# Patient Record
Sex: Female | Born: 1950
Health system: Southern US, Community
[De-identification: ages and names within clinical notes are randomized; demographics above are authoritative.]

## PROBLEM LIST (undated history)

## (undated) DIAGNOSIS — T7840XA Allergy, unspecified, initial encounter: Secondary | ICD-10-CM

## (undated) DIAGNOSIS — D649 Anemia, unspecified: Secondary | ICD-10-CM

## (undated) DIAGNOSIS — K219 Gastro-esophageal reflux disease without esophagitis: Secondary | ICD-10-CM

## (undated) DIAGNOSIS — N289 Disorder of kidney and ureter, unspecified: Secondary | ICD-10-CM

## (undated) DIAGNOSIS — E8881 Metabolic syndrome: Secondary | ICD-10-CM

## (undated) DIAGNOSIS — M1990 Unspecified osteoarthritis, unspecified site: Secondary | ICD-10-CM

## (undated) DIAGNOSIS — I10 Essential (primary) hypertension: Secondary | ICD-10-CM

## (undated) DIAGNOSIS — E669 Obesity, unspecified: Secondary | ICD-10-CM

## (undated) DIAGNOSIS — E785 Hyperlipidemia, unspecified: Secondary | ICD-10-CM

## (undated) DIAGNOSIS — R42 Dizziness and giddiness: Secondary | ICD-10-CM

## (undated) DIAGNOSIS — R569 Unspecified convulsions: Secondary | ICD-10-CM

## (undated) DIAGNOSIS — R7303 Prediabetes: Secondary | ICD-10-CM

## (undated) HISTORY — DX: Gastro-esophageal reflux disease without esophagitis: K21.9

## (undated) HISTORY — DX: Morbid (severe) obesity due to excess calories: E66.01

## (undated) HISTORY — PX: OTHER SURGICAL HISTORY: SHX169

## (undated) HISTORY — DX: Hyperlipidemia, unspecified: E78.5

## (undated) HISTORY — DX: Obesity, unspecified: E66.9

## (undated) HISTORY — DX: Prediabetes: R73.03

## (undated) HISTORY — DX: Metabolic syndrome: E88.81

## (undated) HISTORY — DX: Allergy, unspecified, initial encounter: T78.40XA

## (undated) HISTORY — DX: Essential (primary) hypertension: I10

---

## 1973-08-15 HISTORY — PX: TUBAL LIGATION: SHX77

## 1985-08-15 HISTORY — PX: PARTIAL HYSTERECTOMY: SHX80

## 1998-08-15 DIAGNOSIS — E8881 Metabolic syndrome: Secondary | ICD-10-CM

## 1998-08-15 HISTORY — DX: Metabolic syndrome: E88.810

## 1998-08-15 HISTORY — DX: Metabolic syndrome: E88.81

## 1998-08-15 HISTORY — DX: Morbid (severe) obesity due to excess calories: E66.01

## 2004-07-14 ENCOUNTER — Ambulatory Visit: Payer: Self-pay | Admitting: Family Medicine

## 2005-06-29 ENCOUNTER — Ambulatory Visit: Payer: Self-pay | Admitting: Family Medicine

## 2005-08-22 ENCOUNTER — Ambulatory Visit: Payer: Self-pay | Admitting: Family Medicine

## 2005-09-01 ENCOUNTER — Ambulatory Visit: Payer: Self-pay | Admitting: Family Medicine

## 2005-10-04 ENCOUNTER — Ambulatory Visit: Payer: Self-pay | Admitting: Family Medicine

## 2005-10-27 ENCOUNTER — Ambulatory Visit: Payer: Self-pay | Admitting: Family Medicine

## 2005-12-15 ENCOUNTER — Ambulatory Visit: Payer: Self-pay | Admitting: Family Medicine

## 2005-12-22 ENCOUNTER — Ambulatory Visit (HOSPITAL_COMMUNITY): Admission: RE | Admit: 2005-12-22 | Discharge: 2005-12-22 | Payer: Self-pay | Admitting: Family Medicine

## 2005-12-22 LAB — HM MAMMOGRAPHY: HM Mammogram: NORMAL

## 2006-01-26 ENCOUNTER — Ambulatory Visit: Payer: Self-pay | Admitting: Family Medicine

## 2006-03-28 ENCOUNTER — Ambulatory Visit: Payer: Self-pay | Admitting: Family Medicine

## 2006-04-11 ENCOUNTER — Ambulatory Visit: Payer: Self-pay | Admitting: Family Medicine

## 2006-04-14 ENCOUNTER — Ambulatory Visit: Payer: Self-pay | Admitting: Family Medicine

## 2006-05-09 ENCOUNTER — Ambulatory Visit: Payer: Self-pay | Admitting: Family Medicine

## 2006-05-25 ENCOUNTER — Ambulatory Visit: Payer: Self-pay | Admitting: Family Medicine

## 2006-07-03 ENCOUNTER — Ambulatory Visit: Payer: Self-pay | Admitting: Family Medicine

## 2006-09-01 ENCOUNTER — Encounter: Payer: Self-pay | Admitting: Family Medicine

## 2006-09-01 LAB — CONVERTED CEMR LAB
ALT: 16 units/L (ref 0–35)
Albumin: 4.7 g/dL (ref 3.5–5.2)
Bilirubin, Direct: 0.1 mg/dL (ref 0.0–0.3)
Calcium: 9.4 mg/dL (ref 8.4–10.5)
Creatinine, Ser: 0.86 mg/dL (ref 0.40–1.20)
Glucose, Bld: 91 mg/dL (ref 70–99)
Indirect Bilirubin: 0.4 mg/dL (ref 0.0–0.9)
Potassium: 4.1 meq/L (ref 3.5–5.3)
Sodium: 142 meq/L (ref 135–145)
Total Bilirubin: 0.5 mg/dL (ref 0.3–1.2)
VLDL: 34 mg/dL (ref 0–40)

## 2006-09-04 ENCOUNTER — Ambulatory Visit: Payer: Self-pay | Admitting: Family Medicine

## 2006-10-25 ENCOUNTER — Ambulatory Visit: Payer: Self-pay | Admitting: Family Medicine

## 2006-11-01 ENCOUNTER — Encounter: Payer: Self-pay | Admitting: Family Medicine

## 2006-11-01 LAB — CONVERTED CEMR LAB: Pap Smear: NORMAL

## 2007-01-12 ENCOUNTER — Encounter: Payer: Self-pay | Admitting: Family Medicine

## 2007-01-12 LAB — CONVERTED CEMR LAB
ALT: 14 units/L (ref 0–35)
Albumin: 4.6 g/dL (ref 3.5–5.2)
Alkaline Phosphatase: 59 units/L (ref 39–117)
Basophils Absolute: 0 10*3/uL (ref 0.0–0.1)
Bilirubin, Direct: 0.1 mg/dL (ref 0.0–0.3)
Eosinophils Absolute: 0.1 10*3/uL (ref 0.0–0.7)
HCT: 33.5 % — ABNORMAL LOW (ref 36.0–46.0)
HDL: 41 mg/dL (ref >39)
Indirect Bilirubin: 0.5 mg/dL (ref 0.0–0.9)
LDL Cholesterol: 82 mg/dL (ref 0–99)
MCHC: 33.4 g/dL (ref 30.0–36.0)
Monocytes Absolute: 0.3 10*3/uL (ref 0.2–0.7)
Monocytes Relative: 6 % (ref 3–11)
Neutrophils Relative %: 45 % (ref 43–77)
RDW: 15.5 % — ABNORMAL HIGH (ref 11.5–14.0)
Total Bilirubin: 0.6 mg/dL (ref 0.3–1.2)
Total CHOL/HDL Ratio: 4
WBC: 5.6 10*3/uL (ref 4.0–10.5)

## 2007-01-16 ENCOUNTER — Encounter: Payer: Self-pay | Admitting: Family Medicine

## 2007-01-16 ENCOUNTER — Ambulatory Visit: Payer: Self-pay | Admitting: Family Medicine

## 2007-01-16 ENCOUNTER — Other Ambulatory Visit: Admission: RE | Admit: 2007-01-16 | Discharge: 2007-01-16 | Payer: Self-pay | Admitting: Family Medicine

## 2007-01-16 LAB — CONVERTED CEMR LAB: Pap Smear: NORMAL

## 2007-04-10 ENCOUNTER — Ambulatory Visit: Payer: Self-pay | Admitting: Family Medicine

## 2007-05-08 ENCOUNTER — Ambulatory Visit: Payer: Self-pay | Admitting: Family Medicine

## 2007-10-08 ENCOUNTER — Ambulatory Visit: Payer: Self-pay | Admitting: Family Medicine

## 2007-12-06 ENCOUNTER — Encounter: Payer: Self-pay | Admitting: Family Medicine

## 2007-12-06 ENCOUNTER — Ambulatory Visit: Payer: Self-pay | Admitting: Family Medicine

## 2007-12-06 DIAGNOSIS — J302 Other seasonal allergic rhinitis: Secondary | ICD-10-CM

## 2007-12-06 DIAGNOSIS — I10 Essential (primary) hypertension: Secondary | ICD-10-CM

## 2007-12-07 DIAGNOSIS — E876 Hypokalemia: Secondary | ICD-10-CM | POA: Insufficient documentation

## 2007-12-07 DIAGNOSIS — R42 Dizziness and giddiness: Secondary | ICD-10-CM

## 2008-02-01 ENCOUNTER — Encounter: Payer: Self-pay | Admitting: Family Medicine

## 2008-02-01 LAB — CONVERTED CEMR LAB: Potassium: 4 meq/L (ref 3.5–5.3)

## 2008-02-05 ENCOUNTER — Ambulatory Visit: Payer: Self-pay | Admitting: Family Medicine

## 2008-02-05 ENCOUNTER — Encounter: Payer: Self-pay | Admitting: Family Medicine

## 2008-02-05 LAB — CONVERTED CEMR LAB
Calcium: 9.7 mg/dL (ref 8.4–10.5)
Chloride: 104 meq/L (ref 96–112)
Cholesterol: 180 mg/dL (ref 0–200)
Potassium: 4.1 meq/L (ref 3.5–5.3)
Sodium: 142 meq/L (ref 135–145)
Triglycerides: 303 mg/dL — ABNORMAL HIGH (ref <150)

## 2008-02-08 ENCOUNTER — Ambulatory Visit (HOSPITAL_COMMUNITY): Admission: RE | Admit: 2008-02-08 | Discharge: 2008-02-08 | Payer: Self-pay | Admitting: Family Medicine

## 2008-03-28 ENCOUNTER — Telehealth: Payer: Self-pay | Admitting: Family Medicine

## 2008-06-06 ENCOUNTER — Encounter: Payer: Self-pay | Admitting: Family Medicine

## 2008-06-09 ENCOUNTER — Ambulatory Visit: Payer: Self-pay | Admitting: Family Medicine

## 2008-06-09 DIAGNOSIS — E785 Hyperlipidemia, unspecified: Secondary | ICD-10-CM

## 2008-06-09 LAB — CONVERTED CEMR LAB
ALT: 20 units/L (ref 0–35)
AST: 20 units/L (ref 0–37)
BUN: 14 mg/dL (ref 6–23)
Bilirubin, Direct: <0.1 mg/dL (ref 0.0–0.3)
CO2: 23 meq/L (ref 19–32)
Calcium: 9.4 mg/dL (ref 8.4–10.5)
Chloride: 104 meq/L (ref 96–112)
Creatinine, Ser: 0.99 mg/dL (ref 0.40–1.20)
Glucose, Bld: 106 mg/dL — ABNORMAL HIGH (ref 70–99)
HDL: 47 mg/dL (ref >39)
Total Bilirubin: 0.5 mg/dL (ref 0.3–1.2)
Total Protein: 7.5 g/dL (ref 6.0–8.3)

## 2008-08-15 DIAGNOSIS — R7303 Prediabetes: Secondary | ICD-10-CM

## 2008-08-15 HISTORY — DX: Prediabetes: R73.03

## 2008-10-09 ENCOUNTER — Encounter: Payer: Self-pay | Admitting: Family Medicine

## 2008-10-10 ENCOUNTER — Encounter: Payer: Self-pay | Admitting: Family Medicine

## 2008-10-10 LAB — CONVERTED CEMR LAB
ALT: 18 units/L (ref 0–35)
AST: 19 units/L (ref 0–37)
CO2: 23 meq/L (ref 19–32)
Calcium: 9.8 mg/dL (ref 8.4–10.5)
Cholesterol: 200 mg/dL (ref 0–200)
Creatinine, Ser: 1 mg/dL (ref 0.40–1.20)
Indirect Bilirubin: 0.4 mg/dL (ref 0.0–0.9)
Potassium: 4 meq/L (ref 3.5–5.3)
Total Protein: 7.7 g/dL (ref 6.0–8.3)
Triglycerides: 345 mg/dL — ABNORMAL HIGH (ref <150)

## 2008-10-13 ENCOUNTER — Ambulatory Visit: Payer: Self-pay | Admitting: Family Medicine

## 2008-10-13 DIAGNOSIS — G4700 Insomnia, unspecified: Secondary | ICD-10-CM

## 2008-11-20 ENCOUNTER — Encounter: Payer: Self-pay | Admitting: Family Medicine

## 2009-02-12 ENCOUNTER — Ambulatory Visit: Payer: Self-pay | Admitting: Family Medicine

## 2009-02-13 ENCOUNTER — Encounter: Payer: Self-pay | Admitting: Family Medicine

## 2009-02-13 LAB — CONVERTED CEMR LAB
Basophils Relative: 0 % (ref 0–1)
Eosinophils Relative: 1 % (ref 0–5)
HCT: 36.7 % (ref 36.0–46.0)
Hemoglobin: 12.2 g/dL (ref 12.0–15.0)
Lymphocytes Relative: 53 % — ABNORMAL HIGH (ref 12–46)
Monocytes Relative: 6 % (ref 3–12)
Neutrophils Relative %: 40 % — ABNORMAL LOW (ref 43–77)
Platelets: 264 10*3/uL (ref 150–400)
RBC: 3.99 M/uL (ref 3.87–5.11)
WBC: 5.8 10*3/uL (ref 4.0–10.5)

## 2009-05-01 ENCOUNTER — Emergency Department (HOSPITAL_COMMUNITY): Admission: EM | Admit: 2009-05-01 | Discharge: 2009-05-01 | Payer: Self-pay | Admitting: Emergency Medicine

## 2009-06-24 ENCOUNTER — Ambulatory Visit: Payer: Self-pay | Admitting: Family Medicine

## 2009-07-01 DIAGNOSIS — J019 Acute sinusitis, unspecified: Secondary | ICD-10-CM | POA: Insufficient documentation

## 2009-10-28 ENCOUNTER — Ambulatory Visit: Payer: Self-pay | Admitting: Family Medicine

## 2009-11-09 LAB — CONVERTED CEMR LAB
Albumin: 4.6 g/dL (ref 3.5–5.2)
Basophils Absolute: 0 10*3/uL (ref 0.0–0.1)
Basophils Relative: 0 % (ref 0–1)
CO2: 27 meq/L (ref 19–32)
Calcium: 9.8 mg/dL (ref 8.4–10.5)
Eosinophils Absolute: 0.1 10*3/uL (ref 0.0–0.7)
Eosinophils Relative: 1 % (ref 0–5)
Glucose, Bld: 84 mg/dL (ref 70–99)
HCT: 35.5 % — ABNORMAL LOW (ref 36.0–46.0)
HDL: 48 mg/dL (ref >39)
MCHC: 33.2 g/dL (ref 30.0–36.0)
Monocytes Absolute: 0.7 10*3/uL (ref 0.1–1.0)
Monocytes Relative: 7 % (ref 3–12)
Neutrophils Relative %: 36 % — ABNORMAL LOW (ref 43–77)
Platelets: 277 10*3/uL (ref 150–400)
Potassium: 3.6 meq/L (ref 3.5–5.3)
RBC: 3.85 M/uL — ABNORMAL LOW (ref 3.87–5.11)
RDW: 15.3 % (ref 11.5–15.5)
Total CHOL/HDL Ratio: 4
Triglycerides: 201 mg/dL — ABNORMAL HIGH (ref <150)

## 2010-02-11 ENCOUNTER — Telehealth: Payer: Self-pay | Admitting: Family Medicine

## 2010-03-01 ENCOUNTER — Telehealth: Payer: Self-pay | Admitting: Family Medicine

## 2010-04-13 ENCOUNTER — Encounter: Payer: Self-pay | Admitting: Physician Assistant

## 2010-04-22 LAB — CONVERTED CEMR LAB
AST: 15 units/L (ref 0–37)
Albumin: 4.4 g/dL (ref 3.5–5.2)
Alkaline Phosphatase: 53 units/L (ref 39–117)
BUN: 19 mg/dL (ref 6–23)
Chloride: 106 meq/L (ref 96–112)
Cholesterol: 190 mg/dL (ref 0–200)
Creatinine, Ser: 1.01 mg/dL (ref 0.40–1.20)
HCT: 36.1 % (ref 36.0–46.0)
HDL: 39 mg/dL — ABNORMAL LOW (ref >39)
LDL Cholesterol: 102 mg/dL — ABNORMAL HIGH (ref 0–99)
MCHC: 33.2 g/dL (ref 30.0–36.0)
MCV: 88.7 fL (ref 78.0–100.0)
Platelets: 266 10*3/uL (ref 150–400)
Potassium: 4.6 meq/L (ref 3.5–5.3)
RDW: 14.8 % (ref 11.5–15.5)
Total Protein: 7.1 g/dL (ref 6.0–8.3)
VLDL: 49 mg/dL — ABNORMAL HIGH (ref 0–40)

## 2010-05-31 ENCOUNTER — Ambulatory Visit: Payer: Self-pay | Admitting: Family Medicine

## 2010-05-31 DIAGNOSIS — M25569 Pain in unspecified knee: Secondary | ICD-10-CM

## 2010-09-05 ENCOUNTER — Encounter: Payer: Self-pay | Admitting: Family Medicine

## 2010-09-06 ENCOUNTER — Encounter: Payer: Self-pay | Admitting: Family Medicine

## 2010-09-14 NOTE — Assessment & Plan Note (Signed)
Summary: office visit   Vital Signs:  Patient profile:   60 year old female Menstrual status:  hysterectomy Height:      66.5 inches Weight:      227 pounds BMI:     36.22 O2 Sat:      96 % Pulse rate:   74 / minute Pulse rhythm:   regular Resp:     16 per minute BP sitting:   122 / 82  (left arm) Cuff size:   large  Vitals Entered By: Everitt Amber LPN (October 28, 2009 8:58 AM)  Nutrition Counseling: Patient's BMI is greater than 25 and therefore counseled on weight management options. CC: having some sinus pain and pressure and some drainage from allergies.    CC:  having some sinus pain and pressure and some drainage from allergies. .  History of Present Illness: Reports  that she has been doing well. Denies recent fever or chills. Reports sinus pressure, nasal congestion ,with cleaer drainage , sometimes yellow, no ear pain or sore throat. Denies chest congestion, or cough productive of sputum.she does have a dry cough aty times Denies chest pain, palpitations, PND, orthopnea or leg swelling. Denies abdominal pain, nausea, vomitting, diarrhea or constipation. Denies change in bowel movements or bloody stool. Denies dysuria , frequency, incontinence or hesitancy. Denies  joint pain, swelling, or reduced mobility. Denies headaches, vertigo, seizures. Denies depression, anxiety or insomnia. Denies  rash, lesions, or itch.     Current Medications (verified): 1)  Lovastatin 20 Mg  Tabs (Lovastatin) .... Take 1 Tablet By Mouth Once A Day 2)  Restoril 30 Mg Caps (Temazepam) .... Take 1 Tab By Mouth At Bedtime 3)  Maxzide 75-50 Mg Tabs (Triamterene-Hctz) .... Take 1 Tablet By Mouth Once A Day 4)  Zyrtec Allergy 10 Mg Tabs (Cetirizine Hcl) .... Take 1 Tablet By Mouth Once A Day 5)  Nasonex 50 Mcg/act Susp (Mometasone Furoate) .... 2 Sprays in Each Nostril Daily 6)  Antivert 25 Mg Tabs (Meclizine Hcl) .... One Tab Q 6 Hrs As Needed  Allergies (verified): 1)  ! Pcn 2)  !  Ibuprofen (Ibuprofen)  Review of Systems      See HPI General:  Complains of chills, fever, and malaise; 2 week history. Eyes:  Denies blurring, discharge, eye pain, and red eye. ENT:  Complains of hoarseness, nasal congestion, postnasal drainage, and sinus pressure; thick post nasal drainge, yellow, sometimes associated with fever and chills in the past 2 weeks, ever since the pear trees start blooming. CV:  Denies chest pain or discomfort, difficulty breathing while lying down, palpitations, shortness of breath with exertion, and swelling of feet. Resp:  Complains of cough and shortness of breath; denies sputum productive and wheezing. GI:  Denies abdominal pain, constipation, and diarrhea. Derm:  Denies itching and rash. Heme:  Denies abnormal bruising and bleeding. Allergy:  Complains of persistent infections, seasonal allergies, and sneezing; denies hives or rash.  Physical Exam  General:  Well-developed,well-nourished,in no acute distress; alert,appropriate and cooperative throughout examination HEENT: No facial asymmetry,  EOMI, No sinus tenderness, TM's Clear, oropharynx  pink and moist.   Chest: Clear to auscultation bilaterally.  CVS: S1, S2, No murmurs, No S3.   Abd: Soft, Nontender.  MS: Adequate ROM spine, hips, shoulders and knees.  Ext: No edema.   CNS: CN 2-12 intact, power tone and sensation normal throughout.   Skin: Intact, no visible lesions or rashes.  Psych: Good eye contact, normal affect.  Memory intact, not anxious  or depressed appearing.    Impression & Recommendations:  Problem # 1:  INSOMNIA UNSPECIFIED (ICD-780.52) Assessment Improved  Her updated medication list for this problem includes:    Restoril 30 Mg Caps (Temazepam) .Marland Kitchen... Take 1 tab by mouth at bedtime  Discussed sleep hygiene.   Problem # 2:  ACUTE SINUSITIS, UNSPECIFIED (ICD-461.9) Assessment: Comment Only  The following medications were removed from the medication list:    Septra Ds  800-160 Mg Tabs (Sulfamethoxazole-trimethoprim) .Marland Kitchen... Take 1 tablet by mouth two times a day    Tessalon Perles 100 Mg Caps (Benzonatate) .Marland Kitchen... Take 1 capsule by mouth three times a day Her updated medication list for this problem includes:    Nasonex 50 Mcg/act Susp (Mometasone furoate) .Marland Kitchen... 2 sprays in each nostril daily    Septra Ds 800-160 Mg Tabs (Sulfamethoxazole-trimethoprim) .Marland Kitchen... Take 1 tablet by mouth two times a day    Tessalon Perles 100 Mg Caps (Benzonatate) .Marland Kitchen... Take 1 capsule by mouth three times a day  Problem # 3:  HYPERLIPIDEMIA (ICD-272.4) Assessment: Comment Only  Her updated medication list for this problem includes:    Lovastatin 20 Mg Tabs (Lovastatin) .Marland Kitchen... Take 1 tablet by mouth once a day  Labs Reviewed: SGOT: 19 (10/10/2008)   SGPT: 18 (10/10/2008)   HDL:40 (02/12/2009), 40 (10/10/2008)  LDL:See Comment mg/dL (98/06/9146), 91 (82/95/6213)  Chol:227 (02/12/2009), 200 (10/10/2008)  Trig:467 (02/12/2009), 345 (10/10/2008)  Problem # 4:  OBESITY (ICD-278.00) Assessment: Unchanged  Ht: 66.5 (10/28/2009)   Wt: 227 (10/28/2009)   BMI: 36.22 (10/28/2009)  Problem # 5:  HYPERTENSION (ICD-401.9) Assessment: Improved  Her updated medication list for this problem includes:    Maxzide 75-50 Mg Tabs (Triamterene-hctz) .Marland Kitchen... Take 1 tablet by mouth once a day  BP today: 122/82 Prior BP: 138/94 (06/24/2009)  Labs Reviewed: K+: 4.0 (10/10/2008) Creat: : 1.00 (10/10/2008)   Chol: 227 (02/12/2009)   HDL: 40 (02/12/2009)   LDL: See Comment mg/dL (08/65/7846)   TG: 962 (02/12/2009)  Problem # 6:  ALLERGIC RHINITIS (ICD-477.9) Assessment: Deteriorated  Her updated medication list for this problem includes:    Zyrtec Allergy 10 Mg Tabs (Cetirizine hcl) .Marland Kitchen... Take 1 tablet by mouth once a day    Nasonex 50 Mcg/act Susp (Mometasone furoate) .Marland Kitchen... 2 sprays in each nostril daily  Complete Medication List: 1)  Lovastatin 20 Mg Tabs (Lovastatin) .... Take 1 tablet by mouth  once a day 2)  Restoril 30 Mg Caps (Temazepam) .... Take 1 tab by mouth at bedtime 3)  Maxzide 75-50 Mg Tabs (Triamterene-hctz) .... Take 1 tablet by mouth once a day 4)  Zyrtec Allergy 10 Mg Tabs (Cetirizine hcl) .... Take 1 tablet by mouth once a day 5)  Nasonex 50 Mcg/act Susp (Mometasone furoate) .... 2 sprays in each nostril daily 6)  Antivert 25 Mg Tabs (Meclizine hcl) .... One tab q 6 hrs as needed 7)  Septra Ds 800-160 Mg Tabs (Sulfamethoxazole-trimethoprim) .... Take 1 tablet by mouth two times a day 8)  Tessalon Perles 100 Mg Caps (Benzonatate) .... Take 1 capsule by mouth three times a day  Patient Instructions: 1)  F?u in 5 5 months. 2)  You are being treated for uncontrolled allergies and sinus infection. 3)  Meds are sent top your pharmacy. 4)  It is important that you exercise regularly at least 20 minutes 5 times a week. If you develop chest pain, have severe difficulty breathing, or feel very tired , stop exercising immediately and seek medical attention. 5)  You need to lose weight. Consider a lower calorie diet and regular exercise.  6)  Have a safe and happy Spring and Summer. Prescriptions: NASONEX 50 MCG/ACT SUSP (MOMETASONE FUROATE) 2 sprays in each nostril daily  #1 x 4   Entered by:   Everitt Amber LPN   Authorized by:   Syliva Overman MD   Signed by:   Everitt Amber LPN on 18/56/3149   Method used:   Printed then faxed to ...       [facility] (retail)       [address]       [place], [place]  [address]       Ph: [phone]       Fax: [phone]   RxID:   [id number] RESTORIL 30 MG CAPS (TEMAZEPAM) Take 1 tab by mouth at bedtime  #30 x 4   Entered by:   Everitt Amber LPN   Authorized by:   Syliva Overman MD   Signed by:   Everitt Amber LPN on 76/54/6503   Method used:   Printed then faxed to ...       [facility] (retail)       [address]       [place], [place]  [address]       Ph:  [phone]       Fax: [phone]   RxID:   [id number] PREDNISONE (PAK) 5 MG TABS (PREDNISONE) Use as directed  #21 x 0   Entered and Authorized by:   Syliva Overman MD   Signed by:   Syliva Overman MD on 10/28/2009   Method used:   Electronically to        [facility] (retail)       [address]       [place], [place]  [address]       Ph: [phone]       Fax: [phone]   RxID:   [id number] TESSALON PERLES 100 MG CAPS (BENZONATATE) Take 1 capsule by mouth three times a day  #30 x 0   Entered and Authorized by:   Syliva Overman MD   Signed by:   Syliva Overman MD on 10/28/2009   Method used:   Electronically to        [facility] (retail)       [address]       [place], [place]  [address]       Ph: [phone]       Fax: [phone]   RxID:   [id number] SEPTRA DS 800-160 MG TABS (SULFAMETHOXAZOLE-TRIMETHOPRIM) Take 1 tablet by mouth two times a day  #20 x 0   Entered and Authorized by:   Syliva Overman MD   Signed by:   Syliva Overman MD on 10/28/2009   Method used:   Electronically to        [facility] (retail)       [address]       [place], [place]  [address]       Ph: [phone]       Fax: [phone]   RxID:   [id number]

## 2010-09-14 NOTE — Miscellaneous (Signed)
Summary: Orders Update  Clinical Lists Changes  Orders: Added new Test order of T-Comprehensive Metabolic Panel [id number]) - Signed Added new Test order of T-Lipid Profile ([id number]) - Signed Added new Test order of T-CBC No Diff ([id number]) - Signed

## 2010-09-14 NOTE — Progress Notes (Signed)
  Phone Note Call from Patient   Caller: Patient Summary of Call: patient was short of breath last nite pulse ox today 96% Initial call taken by: Adella Hare LPN,  March 01, 2010 11:31 AM  Follow-up for Phone Call        dr simpson aware Follow-up by: Adella Hare LPN,  March 01, 2010 12:17 PM

## 2010-09-14 NOTE — Assessment & Plan Note (Signed)
Summary: F UP   Vital Signs:  Patient profile:   60 year old female Menstrual status:  hysterectomy Height:      66.5 inches Weight:      225.25 pounds BMI:     35.94 O2 Sat:      98 % on Room air Pulse rate:   100 / minute Pulse rhythm:   regular Resp:     16 per minute BP sitting:   128 / 90  (left arm)  Vitals Entered By: Mauricia Area CMA (May 31, 2010 9:19 AM)  Nutrition Counseling: Patient's BMI is greater than 25 and therefore counseled on weight management options.  O2 Flow:  Room air CC: Follow up. Eye pain and sinuses bothering her.   CC:  Follow up. Eye pain and sinuses bothering her..  History of Present Illness: Reports  thatshe has been doing well. Denies recent fever or chills. Denies sinus pressure, nasal congestion , ear pain or sore throat.reports increased nsal congestion with post nasal drainage  Denies chest congestion, or cough productive of sputum. Denies chest pain, palpitations, PND, orthopnea or leg swelling. Denies abdominal pain, nausea, vomitting, diarrhea or constipation. Denies change in bowel movements or bloody stool. Denies dysuria , frequency, incontinence or hesitancy.  Denies , vertigo, seizures. Denies depression, anxiety or insomnia. Denies  rash, lesions, or itch.     Current Medications (verified): 1)  Lovastatin 20 Mg  Tabs (Lovastatin) .... Take 1 Tablet By Mouth Once A Day 2)  Restoril 30 Mg Caps (Temazepam) .... Take 1 Tab By Mouth As Needed 3)  Zyrtec Allergy 10 Mg Tabs (Cetirizine Hcl) .... Take 1 Tablet By Mouth Once A Day 4)  Nasonex 50 Mcg/act Susp (Mometasone Furoate) .... 2 Sprays in Each Nostril Daily 5)  Antivert 25 Mg Tabs (Meclizine Hcl) .... One Tab Q 6 Hrs As Needed 6)  Maxzide-25 37.5-25 Mg Tabs (Triamterene-Hctz) .... Two Tablets Once Daily 7)  Allegra 180 Mg .Marland Kitchen.. 1 Tab Once A Day  Allergies (verified): 1)  ! Pcn 2)  ! Ibuprofen (Ibuprofen)  Review of Systems      See HPI General:  Complains of  fatigue. Eyes:  Denies blurring and discharge. MS:  Complains of joint pain and stiffness; right knee pain x 1 week , hurt herself during exercise. Neuro:  Complains of headaches; 2 week h/o sinus headache left temporal area, also had red watery eyes yesterday. Endo:  Denies cold intolerance, excessive thirst, and excessive urination. Heme:  Denies abnormal bruising and bleeding. Allergy:  Complains of itching eyes and seasonal allergies; 2 week history.  Physical Exam  General:  Well-developed,obese,in no acute distress; alert,appropriate and cooperative throughout examination HEENT: No facial asymmetry,  EOMI, No sinus tenderness, TM's Clear, oropharynx  pink and moist. Naal mucosa erythematous and edematous  Chest: Clear to auscultation bilaterally.  CVS: S1, S2, No murmurs, No S3.   Abd: Soft, Nontender.  MS: Adequate ROM spine, hips, shoulders and reduced in right  knee.  Ext: No edema.   CNS: CN 2-12 intact, power tone and sensation normal throughout.   Skin: Intact, no visible lesions or rashes.  Psych: Good eye contact, normal affect.  Memory intact, not anxious or depressed appearing.    Impression & Recommendations:  Problem # 1:  KNEE PAIN, RIGHT (ICD-719.46) Assessment Deteriorated  Problem # 2:  ACUTE SINUSITIS, UNSPECIFIED (ICD-461.9) Assessment: Deteriorated  The following medications were removed from the medication list:    Nasonex 50 Mcg/act Susp (Mometasone furoate) .Marland KitchenMarland KitchenMarland KitchenMarland Kitchen  2 sprays in each nostril daily    Septra Ds 800-160 Mg Tabs (Sulfamethoxazole-trimethoprim) .Marland Kitchen... Take 1 tablet by mouth two times a day    Tessalon Perles 100 Mg Caps (Benzonatate) .Marland Kitchen... Take 1 capsule by mouth three times a day Her updated medication list for this problem includes:    Nasonex 50 Mcg/act Susp (Mometasone furoate) .Marland Kitchen..Marland Kitchen Two sprays in each nostril daily , as needed for uncontrolled allergies prednisone dose pack prescribed  Problem # 3:  HYPERLIPIDEMIA  (ICD-272.4) Assessment: Unchanged  Her updated medication list for this problem includes:    Lovastatin 20 Mg Tabs (Lovastatin) .Marland Kitchen... Take 1 tablet by mouth once a day  Orders: T-Hepatic Function [id number]) T-Lipid Profile [id number]) Low fat dietdiscussed and encouraged  Labs Reviewed: SGOT: 15 (04/20/2010)   SGPT: 14 (04/20/2010)   HDL:39 (04/20/2010), 48 (11/02/2009)  LDL:102 (04/20/2010), 102 (11/02/2009)  Chol:190 (04/20/2010), 190 (11/02/2009)  Trig:246 (04/20/2010), 201 (11/02/2009)  Problem # 4:  HYPERTENSION (ICD-401.9) Assessment: Deteriorated  The following medications were removed from the medication list:    Maxzide-25 37.5-25 Mg Tabs (Triamterene-hctz) .Marland Kitchen..Marland Kitchen Two tablets once daily Her updated medication list for this problem includes:    Maxzide-25 37.5-25 Mg Tabs (Triamterene-hctz) ..... One and a half tablets every day Patient advised to follow low sodium diet rich in fruit and vegetables, and to commit to at least 30 minutes 5 days per week of regular exercise , to improve blood presure control.   Orders: T-Basic Metabolic Panel [id number])  BP today: 128/90 Prior BP: 122/82 (10/28/2009)  Labs Reviewed: K+: 4.6 (04/20/2010) Creat: : 1.01 (04/20/2010)   Chol: 190 (04/20/2010)   HDL: 39 (04/20/2010)   LDL: 102 (04/20/2010)   TG: 246 (04/20/2010)  Problem # 5:  OBESITY (ICD-278.00) Assessment: Unchanged  Ht: 66.5 (05/31/2010)   Wt: 225.25 (05/31/2010)   BMI: 35.94 (05/31/2010) therapeutic lifestyle change discussed and encouraged  Complete Medication List: 1)  Lovastatin 20 Mg Tabs (Lovastatin) .... Take 1 tablet by mouth once a day 2)  Restoril 30 Mg Caps (Temazepam) .... Take 1 tab by mouth as needed 3)  Antivert 25 Mg Tabs (Meclizine hcl) .... One tab q 6 hrs as needed 4)  Allegra 180 Mg  .Marland Kitchen.. 1 tab once a day 5)  Nasonex 50 Mcg/act Susp (Mometasone furoate) .... Two sprays in each nostril daily , as needed for uncontrolled allergies 6)   Maxzide-25 37.5-25 Mg Tabs (Triamterene-hctz) .... One and a half tablets every day  Patient Instructions: 1)  Follow up appointment in 5.53months 2)  It is important that you exercise regularly at least 20 minutes 5 times a week. If you develop chest pain, have severe difficulty breathing, or feel very tired , stop exercising immediately and seek medical attention. 3)  You need to lose weight. Consider a lower calorie diet and regular exercise.  4)  Hepatic Panel prior to visit, ICD-9: 5)  Lipid Panel prior to visit, ICD-9: 6)  Chem 7  fasting 7)  Your blood pressure is too high, pls inc the nmaxzide 25mg  tab to oNE and a HALF every  night, start tonight. 8)  You need to reduce red meat , fried and fatty foods and cheese, your triglycerides are still too high and bad cholesterol. 9)  Prednisone is sent in for uncontrolled allergies for 6 days only, this will also help the right knee 10)  pls call the hosp about free mamogram in Oct you need one   Prescriptions: MAXZIDE-25 37.5-25 MG TABS (TRIAMTERENE-HCTZ) one and  a half tablets every day  #45 x 5   Entered and Authorized by:   Syliva Overman MD   Signed by:   Syliva Overman MD on 05/31/2010   Method used:   Printed then faxed to ...       [facility] (retail)       [address]       [place], [place]  [address]       Ph: [phone]       Fax: [phone]   RxID:   [id number] PREDNISONE (PAK) 5 MG TABS (PREDNISONE) Use as directed  #21 x 0   Entered and Authorized by:   Syliva Overman MD   Signed by:   Syliva Overman MD on 05/31/2010   Method used:   Electronically to        [facility] (retail)       [address]       [place], [place]  [address]       Ph: [phone]       Fax: [phone]   RxID:   [id number] NASONEX 50 MCG/ACT SUSP (MOMETASONE FUROATE) two sprays in each nostril daily , as needed for uncontrolled allergies  #1 x 3   Entered and  Authorized by:   Syliva Overman MD   Signed by:   Syliva Overman MD on 05/31/2010   Method used:   Electronically to        [facility] (retail)       [address]       [place], [place]  [address]       Ph: [phone]       Fax: [phone]   RxID:   [id number]    Orders Added: 1)  Est. Patient Level IV [[address]] 2)  T-Basic Metabolic Panel [id number] 3)  T-Hepatic Function [[id number]] 4)  T-Lipid Profile [id number]

## 2010-09-14 NOTE — Progress Notes (Signed)
  Phone Note From Pharmacy   Caller: [facility] Summary of Call: maxzide 75/50 is not available can they use 37.5/25  2 tabs by mouth once daily? Initial call taken by: Adella Hare LPN,  February 11, 2010 9:46 AM  Follow-up for Phone Call        i have written the script , pls stamp and fax to the appriiatw pharmacy and let the pt know Follow-up by: Syliva Overman MD,  February 11, 2010 11:29 AM  Additional Follow-up for Phone Call Additional follow up Details #1::        faxed, called patient, no answer Additional Follow-up by: Adella Hare LPN,  February 11, 2010 11:35 AM    New/Updated Medications: MAXZIDE-25 37.5-25 MG TABS (TRIAMTERENE-HCTZ) two tablets once daily Prescriptions: MAXZIDE-25 37.5-25 MG TABS (TRIAMTERENE-HCTZ) two tablets once daily  #60 x 2   Entered and Authorized by:   Syliva Overman MD   Signed by:   Syliva Overman MD on 02/11/2010   Method used:   Printed then faxed to ...       [facility] (retail)       [address]       [place], [place]  [address]       Ph: [phone]       Fax: [phone]   RxID:   [id number]

## 2010-11-23 ENCOUNTER — Encounter: Payer: Self-pay | Admitting: Family Medicine

## 2010-11-23 ENCOUNTER — Other Ambulatory Visit: Payer: Self-pay | Admitting: Family Medicine

## 2010-11-23 LAB — LIPID PANEL
Cholesterol: 202 mg/dL — ABNORMAL HIGH (ref 0–200)
LDL Cholesterol: 98 mg/dL (ref 0–99)
Total CHOL/HDL Ratio: 4.8 Ratio
Triglycerides: 312 mg/dL — ABNORMAL HIGH (ref <150)

## 2010-11-23 LAB — BASIC METABOLIC PANEL
BUN: 14 mg/dL (ref 6–23)
CO2: 27 mEq/L (ref 19–32)
Creat: 1.01 mg/dL (ref 0.40–1.20)

## 2010-11-23 LAB — HEPATIC FUNCTION PANEL
ALT: 23 U/L (ref 0–35)
Albumin: 5 g/dL (ref 3.5–5.2)
Alkaline Phosphatase: 77 U/L (ref 39–117)
Indirect Bilirubin: 0.4 mg/dL (ref 0.0–0.9)
Total Bilirubin: 0.5 mg/dL (ref 0.3–1.2)
Total Protein: 7.6 g/dL (ref 6.0–8.3)

## 2010-11-24 ENCOUNTER — Telehealth: Payer: Self-pay | Admitting: *Deleted

## 2010-11-24 NOTE — Progress Notes (Signed)
Patient aware.

## 2010-11-26 NOTE — Telephone Encounter (Signed)
Patient aware.

## 2010-12-09 ENCOUNTER — Other Ambulatory Visit: Payer: Self-pay | Admitting: Family Medicine

## 2011-01-21 ENCOUNTER — Other Ambulatory Visit: Payer: Self-pay | Admitting: Family Medicine

## 2011-01-21 ENCOUNTER — Encounter: Payer: Self-pay | Admitting: Family Medicine

## 2011-01-27 ENCOUNTER — Ambulatory Visit (INDEPENDENT_AMBULATORY_CARE_PROVIDER_SITE_OTHER): Payer: Self-pay | Admitting: Family Medicine

## 2011-01-27 ENCOUNTER — Encounter: Payer: Self-pay | Admitting: Family Medicine

## 2011-01-27 VITALS — BP 130/84 | HR 94 | Resp 16 | Ht 65.5 in | Wt 223.1 lb

## 2011-01-27 DIAGNOSIS — E785 Hyperlipidemia, unspecified: Secondary | ICD-10-CM

## 2011-01-27 DIAGNOSIS — R7301 Impaired fasting glucose: Secondary | ICD-10-CM

## 2011-01-27 DIAGNOSIS — Z139 Encounter for screening, unspecified: Secondary | ICD-10-CM

## 2011-01-27 DIAGNOSIS — J309 Allergic rhinitis, unspecified: Secondary | ICD-10-CM

## 2011-01-27 DIAGNOSIS — I10 Essential (primary) hypertension: Secondary | ICD-10-CM

## 2011-01-27 DIAGNOSIS — F329 Major depressive disorder, single episode, unspecified: Secondary | ICD-10-CM

## 2011-01-27 LAB — HEMOGLOBIN A1C: Hgb A1c MFr Bld: 6 % — ABNORMAL HIGH (ref <5.7)

## 2011-01-27 MED ORDER — FLUOXETINE HCL 10 MG PO CAPS: 10.0000 mg | ORAL_CAPSULE | Freq: Every day | ORAL | Status: DC

## 2011-01-27 MED ORDER — METHYLPREDNISOLONE ACETATE 80 MG/ML IJ SUSP
80.0000 mg | Freq: Once | INTRAMUSCULAR | Status: AC
  Administered 2011-01-27: 80 mg via INTRAMUSCULAR

## 2011-01-27 NOTE — Assessment & Plan Note (Signed)
Deteriorated, due to repeated unfaithfulness in her spouse, reportedly the 62th

## 2011-01-27 NOTE — Patient Instructions (Signed)
F/u in 2.5 months.  New med for depression.  HBA1C and HIV today.   A healthy diet is rich in fruit, vegetables and whole grains. Poultry fish, nuts and beans are a healthy choice for protein rather then red meat. A low sodium diet and drinking 64 ounces of water daily is generally recommended. Oils and sweet should be limited. Carbohydrates especially for those who are diabetic or overweight, should be limited to 34-45 gram per meal. It is important to eat on a regular schedule, at least 3 times daily. Snacks should be primarily fruits, vegetables or nuts.   It is important that you exercise regularly at least 30 minutes 5 times a week. If you develop chest pain, have severe difficulty breathing, or feel very tired, stop exercising immediately and seek medical attention    You need to reduce cheese, butter, sweets, cake, ice cream.  depomedrol today for allergies

## 2011-01-27 NOTE — Progress Notes (Signed)
  Subjective:    Patient ID: Sarah Coleman, female    DOB: 19-Mar-1951, 60 y.o.   MRN: [id number]  HPI Pt reports that for the past 4 weeks she has had increased stress, her partener is being unfaithful, she reports breaking out, nausea, stress , poor self esteem, wants HIV test.She is not suicidal or homicidal and denies hallucinations. Also c/o increase and uncontrolled allergy symptoms of nasal congestion and increased clear nasal drainage No other new oncerns, still attempting lifestyle change inconsistently to achieve weight loss       Review of Systems Denies recent fever or chills. Denies sinus pressure, ear pain or sore throat. Denies chest congestion, productive cough or wheezing. Denies chest pains, palpitations, paroxysmal nocturnal dyspnea, orthopnea and leg swelling Denies abdominal pain, nausea, vomiting,diarrhea or constipation.  Denies rectal bleeding or change in bowel movement. Denies dysuria, frequency, hesitancy or incontinence. Denies joint pain, swelling and limitation in mobility. Denies seizure, numbness, or tingling.  Denies skin break down or rash.        Objective:   Physical Exam    Patient alert and oriented and in no Cardiopulmonary distress.  HEENT: No facial asymmetry, EOMI, no sinus tenderness, TM's clear, Oropharynx pink and moist.  Neck supple no adenopathy. Nasal mucosa eryhtematous and edematous, no sinus tenderness Chest: Clear to auscultation bilaterally.  CVS: S1, S2 no murmurs, no S3.  ABD: Soft non tender. Bowel sounds normal.  Ext: No edema  MS: Adequate ROM spine, shoulders, hips and knees.  Skin: Intact, no ulcerations or rash noted.  Psych: Good eye contact, normal affect. Memory intact , at times tearful and angry,depressed appearing.  CNS: CN 2-12 intact, power, tone and sensation normal throughout.     Assessment & Plan:

## 2011-01-28 ENCOUNTER — Other Ambulatory Visit: Payer: Self-pay

## 2011-01-28 LAB — HIV ANTIBODY (ROUTINE TESTING W REFLEX): HIV: NONREACTIVE

## 2011-02-06 NOTE — Assessment & Plan Note (Signed)
Uncontrolled, markedly elevated triglycerides, low fat diet discussed and encouraged, no med chnges

## 2011-02-06 NOTE — Assessment & Plan Note (Signed)
Controlled, no change in medication  

## 2011-02-09 ENCOUNTER — Other Ambulatory Visit: Payer: Self-pay

## 2011-02-09 MED ORDER — TRIAMTERENE-HCTZ 37.5-25 MG PO TABS: ORAL_TABLET | ORAL | Status: DC

## 2011-04-10 ENCOUNTER — Other Ambulatory Visit: Payer: Self-pay | Admitting: Family Medicine

## 2011-04-11 ENCOUNTER — Encounter: Payer: Self-pay | Admitting: Family Medicine

## 2011-04-12 ENCOUNTER — Encounter: Payer: Self-pay | Admitting: Family Medicine

## 2011-04-12 ENCOUNTER — Ambulatory Visit (INDEPENDENT_AMBULATORY_CARE_PROVIDER_SITE_OTHER): Payer: Self-pay | Admitting: Family Medicine

## 2011-04-12 VITALS — BP 120/82 | HR 77 | Resp 14 | Ht 66.0 in | Wt 223.0 lb

## 2011-04-12 DIAGNOSIS — R7309 Other abnormal glucose: Secondary | ICD-10-CM

## 2011-04-12 DIAGNOSIS — E785 Hyperlipidemia, unspecified: Secondary | ICD-10-CM

## 2011-04-12 DIAGNOSIS — R7303 Prediabetes: Secondary | ICD-10-CM

## 2011-04-12 DIAGNOSIS — J309 Allergic rhinitis, unspecified: Secondary | ICD-10-CM

## 2011-04-12 DIAGNOSIS — J019 Acute sinusitis, unspecified: Secondary | ICD-10-CM

## 2011-04-12 DIAGNOSIS — I10 Essential (primary) hypertension: Secondary | ICD-10-CM

## 2011-04-12 DIAGNOSIS — F329 Major depressive disorder, single episode, unspecified: Secondary | ICD-10-CM

## 2011-04-12 DIAGNOSIS — G4700 Insomnia, unspecified: Secondary | ICD-10-CM

## 2011-04-12 MED ORDER — FLUOXETINE HCL 20 MG PO CAPS: 20.0000 mg | ORAL_CAPSULE | Freq: Every day | ORAL | Status: DC

## 2011-04-12 MED ORDER — MOMETASONE FUROATE 50 MCG/ACT NA SUSP: 2.0000 | Freq: Every day | NASAL | Status: DC

## 2011-04-12 MED ORDER — TEMAZEPAM 15 MG PO CAPS: 15.0000 mg | ORAL_CAPSULE | Freq: Every evening | ORAL | Status: AC | PRN

## 2011-04-12 MED ORDER — SULFAMETHOXAZOLE-TRIMETHOPRIM 800-160 MG PO TABS: 1.0000 | ORAL_TABLET | Freq: Two times a day (BID) | ORAL | Status: AC

## 2011-04-12 MED ORDER — LOVASTATIN 20 MG PO TABS: 20.0000 mg | ORAL_TABLET | Freq: Every day | ORAL | Status: DC

## 2011-04-12 NOTE — Assessment & Plan Note (Signed)
Improved, but sub optimal control, dose inc in med

## 2011-04-12 NOTE — Assessment & Plan Note (Signed)
Antibiotic course prescribed, pt reports intermittent fever and chills "since her last visit" with sinus pressure

## 2011-04-12 NOTE — Patient Instructions (Addendum)
F/u early October.  Dose increase on fluoxetine to 20 mg one daily. (PLs take two 10mg  capsules once daily till done)  Antibiotic sent in for sinusitis  Fasting lipid, hepatic, chem 7 and HBA1c  It is important that you exercise regularly at least 30 minutes 5 times a week. If you develop chest pain, have severe difficulty breathing, or feel very tired, stop exercising immediately and seek medical attention    A healthy diet is rich in fruit, vegetables and whole grains. Poultry fish, nuts and beans are a healthy choice for protein rather then red meat. A low sodium diet and drinking 64 ounces of water daily is generally recommended. Oils and sweet should be limited. Carbohydrates especially for those who are diabetic or overweight, should be limited to 34-45 gram per meal. It is important to eat on a regular schedule, at least 3 times daily. Snacks should be primarily fruits, vegetables or nuts.  pLS call the hopital for free mammogram

## 2011-04-12 NOTE — Assessment & Plan Note (Signed)
rept labs in 2 to 3 months, low fat diet discussed and encouraged

## 2011-04-12 NOTE — Progress Notes (Signed)
  Subjective:    Patient ID: Sarah Coleman, female    DOB: [birth year], 60 y.o.   MRN: [id number]  HPI The PT is here for follow up and re-evaluation of chronic medical conditions, medication management and review of any available recent lab and radiology data.  Preventive health is updated, specifically  Cancer screening and Immunization.   Questions or concerns regarding consultations or procedures which the PT has had in the interim are  addressed. The PT denies any adverse reactions to current medications since the last visit.  C/o 6 week h/o sinus pressure , clear drainage, intermittent fever and chills. Cough is productive of clear sputum Improvement in her "nerves" and symptoms of depression, though requests "non generic" ...sems to have done better. Not suicidal or homicidal, less crying spells   Review of Systems See HPI  Denies chest pains, palpitations and leg swelling Denies abdominal pain, nausea, vomiting,diarrhea or constipation.   Denies dysuria, frequency, hesitancy or incontinence. Denies joint pain, swelling and limitation in mobility. Denies headaches, seizures, numbness, or tingling. Denies uncontrolled  depression, anxiety or insomnia. Denies skin break down or rash.        Objective:   Physical Exam Patient alert and oriented and in no cardiopulmonary distress.  HEENT: No facial asymmetry, EOMI,frontal and maxillary  sinus tenderness,  oropharynx pink and moist.  Neck supple no adenopathy.  Chest: Clear to auscultation bilaterally.  CVS: S1, S2 no murmurs, no S3.  ABD: Soft non tender. Bowel sounds normal.  Ext: No edema  MS: Adequate ROM spine, shoulders, hips and knees.  Skin: Intact, no ulcerations or rash noted.  Psych: Good eye contact, normal affect. Memory intact not anxious or depressed appearing.  CNS: CN 2-12 intact, power, tone and sensation normal throughout.        Assessment & Plan:

## 2011-04-12 NOTE — Assessment & Plan Note (Signed)
Increased symptoms , due to changing weather, pt to use med daily

## 2011-04-12 NOTE — Assessment & Plan Note (Signed)
Controlled, no change in medication  

## 2011-05-09 ENCOUNTER — Telehealth: Payer: Self-pay | Admitting: Family Medicine

## 2011-05-09 NOTE — Telephone Encounter (Signed)
Already called and left message at that number that medication was sent to [facility]

## 2011-05-30 ENCOUNTER — Encounter: Payer: Self-pay | Admitting: Family Medicine

## 2011-06-01 LAB — HEPATIC FUNCTION PANEL
Albumin: 5.1 g/dL (ref 3.5–5.2)
Bilirubin, Direct: 0.1 mg/dL (ref 0.0–0.3)
Total Bilirubin: 0.5 mg/dL (ref 0.3–1.2)

## 2011-06-01 LAB — BASIC METABOLIC PANEL
CO2: 28 mEq/L (ref 19–32)
Chloride: 99 mEq/L (ref 96–112)
Glucose, Bld: 113 mg/dL — ABNORMAL HIGH (ref 70–99)
Sodium: 138 mEq/L (ref 135–145)

## 2011-06-01 LAB — LIPID PANEL
HDL: 40 mg/dL (ref >39)
LDL Cholesterol: 81 mg/dL (ref 0–99)
Total CHOL/HDL Ratio: 4.6 Ratio

## 2011-06-02 ENCOUNTER — Ambulatory Visit (INDEPENDENT_AMBULATORY_CARE_PROVIDER_SITE_OTHER): Payer: Self-pay | Admitting: Family Medicine

## 2011-06-02 ENCOUNTER — Encounter: Payer: Self-pay | Admitting: Family Medicine

## 2011-06-02 VITALS — BP 140/82 | HR 90 | Resp 16 | Ht 66.0 in | Wt 219.0 lb

## 2011-06-02 DIAGNOSIS — N390 Urinary tract infection, site not specified: Secondary | ICD-10-CM

## 2011-06-02 DIAGNOSIS — F32A Depression, unspecified: Secondary | ICD-10-CM

## 2011-06-02 DIAGNOSIS — R7301 Impaired fasting glucose: Secondary | ICD-10-CM

## 2011-06-02 DIAGNOSIS — F329 Major depressive disorder, single episode, unspecified: Secondary | ICD-10-CM

## 2011-06-02 DIAGNOSIS — J309 Allergic rhinitis, unspecified: Secondary | ICD-10-CM

## 2011-06-02 DIAGNOSIS — I10 Essential (primary) hypertension: Secondary | ICD-10-CM

## 2011-06-02 DIAGNOSIS — R5381 Other malaise: Secondary | ICD-10-CM

## 2011-06-02 DIAGNOSIS — R5383 Other fatigue: Secondary | ICD-10-CM

## 2011-06-02 LAB — POCT URINALYSIS DIPSTICK
Clarity, UA: NEGATIVE
Color, UA: NEGATIVE
Glucose, UA: NEGATIVE
Nitrite, UA: NEGATIVE
Spec Grav, UA: 1.015
Urobilinogen, UA: 0.2

## 2011-06-02 MED ORDER — TRIAMTERENE-HCTZ 75-50 MG PO TABS: 1.0000 | ORAL_TABLET | Freq: Every day | ORAL | Status: DC

## 2011-06-02 MED ORDER — CIPROFLOXACIN HCL 500 MG PO TABS: 500.0000 mg | ORAL_TABLET | Freq: Two times a day (BID) | ORAL | Status: AC

## 2011-06-02 MED ORDER — MOMETASONE FUROATE 50 MCG/ACT NA SUSP: 2.0000 | Freq: Every day | NASAL | Status: DC

## 2011-06-02 MED ORDER — FLUOXETINE HCL 20 MG PO CAPS: 20.0000 mg | ORAL_CAPSULE | Freq: Every day | ORAL | Status: DC

## 2011-06-02 NOTE — Assessment & Plan Note (Signed)
Uncontrolled. Medication compliance addressed. Commitment to regular exercise, and healthy  eating habits with portion control discussed. DASH diet, and low fat diet discussed, and literature offered. changes in medication at this time.  

## 2011-06-02 NOTE — Progress Notes (Signed)
Addended by: Abner Greenspan on: 06/02/2011 08:39 AM   Modules accepted: Orders

## 2011-06-02 NOTE — Assessment & Plan Note (Signed)
Improved and controlled, no med change 

## 2011-06-02 NOTE — Progress Notes (Signed)
  Subjective:    Patient ID: Sarah Coleman, female    DOB: 1951-01-07, 60 y.o.   MRN: [id number]  HPI 1 week h/o urinary frequency , no fever , chills, or flank pain No intolerance to blood pressure med   Review of Systems See HPI Denies recent fever or chills. Denies sinus pressure, nasal congestion, ear pain or sore throat. Denies chest congestion, productive cough or wheezing. Denies chest pains, palpitations and leg swelling Denies abdominal pain, nausea, vomiting,diarrhea or constipation.   Denies joint pain, swelling and limitation in mobility. Denies headaches, seizures, numbness, or tingling. Denies uncontrolled  depression, anxiety or insomnia. Denies skin break down or rash.        Objective:   Physical Exam  Patient alert and oriented and in no cardiopulmonary distress.  HEENT: No facial asymmetry, EOMI, no sinus tenderness,  oropharynx pink and moist.  Neck supple no adenopathy.  Chest: Clear to auscultation bilaterally.  CVS: S1, S2 no murmurs, no S3.  ABD: Soft non tender. Bowel sounds normal.  Ext: No edema  MS: Adequate ROM spine, shoulders, hips and knees.  Skin: Intact, no ulcerations or rash noted.  Psych: Good eye contact, normal affect. Memory intact not anxious or depressed appearing.  CNS: CN 2-12 intact, power, tone and sensation normal throughout.       Assessment & Plan:

## 2011-06-02 NOTE — Patient Instructions (Addendum)
F./U end February.  Fasting cbc, tsh , chem 7 HBA1C in Feb  LABWORK  NEEDS TO BE DONE BETWEEN 3 TO 7 DAYS BEFORE YOUR NEXT SCEDULED  VISIT.  THIS WILL IMPROVE THE QUALITY OF YOUR CARE.   Congrats on weight loss keep it up!  Weight loss goal is 3 to 4 pounds per month  Dose increase on the blood pressure med, take TWO of the lower dose pills you have till done, new med is Maxzide 50mg  ONE DAILY Medication is sent in for possible UTI, your urine is abnormal  It is important that you exercise regularly at least 30 minutes 5 times a week. If you develop chest pain, have severe difficulty breathing, or feel very tired, stop exercising immediately and seek medical attention   A healthy diet is rich in fruit, vegetables and whole grains. Poultry fish, nuts and beans are a healthy choice for protein rather then red meat. A low sodium diet and drinking 64 ounces of water daily is generally recommended. Oils and sweet should be limited. Carbohydrates especially for those who are diabetic or overweight, should be limited to 30-45 gram per meal. It is important to eat on a regular schedule, at least 3 times daily. Snacks should be primarily fruits, vegetables or nuts.   Please try and arrange for your mammogram as discussed

## 2011-06-02 NOTE — Assessment & Plan Note (Signed)
Controlled, no change in medication  

## 2011-06-02 NOTE — Progress Notes (Signed)
Addended by: Abner Greenspan on: 06/02/2011 12:18 PM   Modules accepted: Orders

## 2011-06-02 NOTE — Assessment & Plan Note (Signed)
Symptomatic  Mildly, if UA abn will send in 3 day course  Of antibiotic

## 2011-08-16 DIAGNOSIS — K219 Gastro-esophageal reflux disease without esophagitis: Secondary | ICD-10-CM

## 2011-08-16 HISTORY — DX: Gastro-esophageal reflux disease without esophagitis: K21.9

## 2011-08-28 ENCOUNTER — Other Ambulatory Visit: Payer: Self-pay | Admitting: Family Medicine

## 2011-10-05 ENCOUNTER — Ambulatory Visit: Payer: Self-pay | Admitting: Family Medicine

## 2011-11-14 ENCOUNTER — Ambulatory Visit: Payer: Self-pay | Admitting: Family Medicine

## 2011-12-02 ENCOUNTER — Other Ambulatory Visit: Payer: Self-pay | Admitting: Family Medicine

## 2011-12-11 ENCOUNTER — Other Ambulatory Visit: Payer: Self-pay | Admitting: Family Medicine

## 2011-12-13 ENCOUNTER — Telehealth: Payer: Self-pay | Admitting: Family Medicine

## 2011-12-13 DIAGNOSIS — I10 Essential (primary) hypertension: Secondary | ICD-10-CM

## 2011-12-13 MED ORDER — TRIAMTERENE-HCTZ 75-50 MG PO TABS: 1.0000 | ORAL_TABLET | Freq: Every day | ORAL | Status: DC

## 2011-12-13 NOTE — Telephone Encounter (Signed)
Sent in

## 2011-12-27 ENCOUNTER — Ambulatory Visit: Payer: Self-pay | Admitting: Family Medicine

## 2011-12-31 ENCOUNTER — Other Ambulatory Visit: Payer: Self-pay | Admitting: Family Medicine

## 2012-01-04 LAB — CBC WITH DIFFERENTIAL/PLATELET
Basophils Absolute: 0 10*3/uL (ref 0.0–0.1)
HCT: 37.7 % (ref 36.0–46.0)
Hemoglobin: 12.8 g/dL (ref 12.0–15.0)
Lymphocytes Relative: 53 % — ABNORMAL HIGH (ref 12–46)
Monocytes Absolute: 0.3 10*3/uL (ref 0.1–1.0)
Neutro Abs: 2.8 10*3/uL (ref 1.7–7.7)
Neutrophils Relative %: 42 % — ABNORMAL LOW (ref 43–77)
RDW: 15.3 % (ref 11.5–15.5)
WBC: 6.8 10*3/uL (ref 4.0–10.5)

## 2012-01-04 LAB — BASIC METABOLIC PANEL
Calcium: 9.8 mg/dL (ref 8.4–10.5)
Creat: 0.98 mg/dL (ref 0.50–1.10)
Glucose, Bld: 106 mg/dL — ABNORMAL HIGH (ref 70–99)
Sodium: 138 mEq/L (ref 135–145)

## 2012-01-04 LAB — TSH: TSH: 0.943 u[IU]/mL (ref 0.350–4.500)

## 2012-01-04 LAB — HEMOGLOBIN A1C: Mean Plasma Glucose: 126 mg/dL — ABNORMAL HIGH (ref <117)

## 2012-01-10 ENCOUNTER — Ambulatory Visit (INDEPENDENT_AMBULATORY_CARE_PROVIDER_SITE_OTHER): Payer: Self-pay | Admitting: Family Medicine

## 2012-01-10 VITALS — BP 136/82 | HR 82 | Resp 18 | Ht 66.0 in | Wt 227.0 lb

## 2012-01-10 DIAGNOSIS — M62838 Other muscle spasm: Secondary | ICD-10-CM

## 2012-01-10 DIAGNOSIS — I10 Essential (primary) hypertension: Secondary | ICD-10-CM

## 2012-01-10 DIAGNOSIS — J309 Allergic rhinitis, unspecified: Secondary | ICD-10-CM

## 2012-01-10 DIAGNOSIS — F329 Major depressive disorder, single episode, unspecified: Secondary | ICD-10-CM

## 2012-01-10 DIAGNOSIS — F32A Depression, unspecified: Secondary | ICD-10-CM

## 2012-01-10 DIAGNOSIS — E785 Hyperlipidemia, unspecified: Secondary | ICD-10-CM

## 2012-01-10 DIAGNOSIS — E669 Obesity, unspecified: Secondary | ICD-10-CM

## 2012-01-10 MED ORDER — MOMETASONE FUROATE 50 MCG/ACT NA SUSP: 2.0000 | Freq: Every day | NASAL | Status: DC

## 2012-01-10 MED ORDER — PREDNISONE (PAK) 5 MG PO TABS: 5.0000 mg | ORAL_TABLET | ORAL | Status: DC

## 2012-01-10 MED ORDER — CYCLOBENZAPRINE HCL 10 MG PO TABS: ORAL_TABLET | ORAL | Status: DC

## 2012-01-10 NOTE — Patient Instructions (Addendum)
F/u in 6 month  Medication is sent in for uncontrolled allergies and left neck spasm.  It is vital you work on changing your eating habits so you lose weight, you need to avoid becoming diabetic  Fasting lipid, cmp and HBA1C in 6 month before next visit

## 2012-01-10 NOTE — Progress Notes (Signed)
  Subjective:    Patient ID: Sarah Coleman, female    DOB: 1951/06/06, 61 y.o.   MRN: [id number]  HPI The PT is here for follow up and re-evaluation of chronic medical conditions, medication management and review of any available recent lab and radiology data.  Preventive health is updated, specifically  Cancer screening and Immunization.   Questions or concerns regarding consultations or procedures which the PT has had in the interim are  addressed. The PT denies any adverse reactions to current medications since the last visit.  There are no new concerns.  C/o increased and uncontrolled allergies since change to Spring, needs medication. Inconsistent exercise and dietary change with weight gain   Review of Systems See HPI Denies recent fever or chills. Denies sinus pressure, nasal congestion, ear pain or sore throat. Denies chest congestion, productive cough or wheezing. Denies chest pains, palpitations and leg swelling Denies abdominal pain, nausea, vomiting,diarrhea or constipation.   Denies dysuria, frequency, hesitancy or incontinence. Denies joint pain, swelling and limitation in mobility. Denies headaches, seizures, numbness, or tingling. Denies depression, anxiety or insomnia. Denies skin break down or rash.       Objective:   Physical Exam Patient alert and oriented and in no cardiopulmonary distress.  HEENT: No facial asymmetry, EOMI, no sinus tenderness,  oropharynx pink and moist.  Neck supple no adenopathy.  Chest: Clear to auscultation bilaterally.  CVS: S1, S2 no murmurs, no S3.  ABD: Soft non tender. Bowel sounds normal.  Ext: No edema  MS: Adequate ROM spine, shoulders, hips and knees.  Skin: Intact, no ulcerations or rash noted.  Psych: Good eye contact, normal affect. Memory intact not anxious or depressed appearing.  CNS: CN 2-12 intact, power, tone and sensation normal throughout.        Assessment & Plan:

## 2012-01-15 ENCOUNTER — Encounter: Payer: Self-pay | Admitting: Family Medicine

## 2012-01-15 NOTE — Assessment & Plan Note (Signed)
Uncontrolled med sent in 

## 2012-01-15 NOTE — Assessment & Plan Note (Signed)
Deteriorated. Patient re-educated about  the importance of commitment to a  minimum of 150 minutes of exercise per week. The importance of healthy food choices with portion control discussed. Encouraged to start a food diary, count calories and to consider  joining a support group. Sample diet sheets offered. Goals set by the patient for the next several months.    

## 2012-01-15 NOTE — Assessment & Plan Note (Signed)
Acute onset, medication sent in

## 2012-01-15 NOTE — Assessment & Plan Note (Signed)
Controlled, no change in medication  

## 2012-01-15 NOTE — Assessment & Plan Note (Addendum)
Uncontrolled updated labs needed, low fat diet discussed and encouraged

## 2012-04-03 ENCOUNTER — Other Ambulatory Visit: Payer: Self-pay | Admitting: Family Medicine

## 2012-05-03 ENCOUNTER — Other Ambulatory Visit: Payer: Self-pay | Admitting: Family Medicine

## 2012-05-07 ENCOUNTER — Ambulatory Visit (INDEPENDENT_AMBULATORY_CARE_PROVIDER_SITE_OTHER): Payer: Self-pay | Admitting: Family Medicine

## 2012-05-07 ENCOUNTER — Encounter: Payer: Self-pay | Admitting: Family Medicine

## 2012-05-07 ENCOUNTER — Other Ambulatory Visit: Payer: Self-pay

## 2012-05-07 VITALS — BP 138/84 | HR 84 | Resp 16 | Ht 66.0 in | Wt 233.8 lb

## 2012-05-07 DIAGNOSIS — F32A Depression, unspecified: Secondary | ICD-10-CM

## 2012-05-07 DIAGNOSIS — R7301 Impaired fasting glucose: Secondary | ICD-10-CM

## 2012-05-07 DIAGNOSIS — E669 Obesity, unspecified: Secondary | ICD-10-CM

## 2012-05-07 DIAGNOSIS — I10 Essential (primary) hypertension: Secondary | ICD-10-CM

## 2012-05-07 DIAGNOSIS — E785 Hyperlipidemia, unspecified: Secondary | ICD-10-CM

## 2012-05-07 DIAGNOSIS — R300 Dysuria: Secondary | ICD-10-CM

## 2012-05-07 DIAGNOSIS — F329 Major depressive disorder, single episode, unspecified: Secondary | ICD-10-CM

## 2012-05-07 DIAGNOSIS — J309 Allergic rhinitis, unspecified: Secondary | ICD-10-CM

## 2012-05-07 DIAGNOSIS — N390 Urinary tract infection, site not specified: Secondary | ICD-10-CM

## 2012-05-07 DIAGNOSIS — J019 Acute sinusitis, unspecified: Secondary | ICD-10-CM

## 2012-05-07 LAB — POCT URINALYSIS DIPSTICK
Protein, UA: NEGATIVE
Spec Grav, UA: 1.015
Urobilinogen, UA: 0.2
pH, UA: 8.5

## 2012-05-07 MED ORDER — TRIAMTERENE-HCTZ 75-50 MG PO TABS: 1.0000 | ORAL_TABLET | Freq: Every day | ORAL | Status: DC

## 2012-05-07 MED ORDER — FLUOXETINE HCL 10 MG PO CAPS: ORAL_CAPSULE | ORAL | Status: DC

## 2012-05-07 MED ORDER — FLUCONAZOLE 150 MG PO TABS: ORAL_TABLET | ORAL | Status: DC

## 2012-05-07 MED ORDER — SULFAMETHOXAZOLE-TRIMETHOPRIM 800-160 MG PO TABS: 1.0000 | ORAL_TABLET | Freq: Two times a day (BID) | ORAL | Status: DC

## 2012-05-07 NOTE — Patient Instructions (Addendum)
F/u in 5.5 month.  Please call if you need me before.  You are treated for sinusitis, and for vaginal yeast infection.  Blood pressure is excellent, no med change  Fasting lipid, cmp and HBA1C in 5.5 months before visit  No urinary infection on exam  Reduce fluoxetine to 10mg  tablet when next filled.  It is important that you exercise regularly at least 30 minutes 5 times a week. If you develop chest pain, have severe difficulty breathing, or feel very tired, stop exercising immediately and seek medical attention  A healthy diet is rich in fruit, vegetables and whole grains. Poultry fish, nuts and beans are a healthy choice for protein rather then red meat. A low sodium diet and drinking 64 ounces of water daily is generally recommended. Oils and sweet should be limited. Carbohydrates especially for those who are diabetic or overweight, should be limited to 30-45 gram per meal. It is important to eat on a regular schedule, at least 3 times daily. Snacks should be primarily fruits, vegetables or nuts.

## 2012-05-07 NOTE — Assessment & Plan Note (Signed)
Normal CCUA, symptoms more suggestive of vulval irritation

## 2012-05-07 NOTE — Progress Notes (Signed)
  Subjective:    Patient ID: Sarah Coleman, female    DOB: 11-21-1950, 61 y.o.   MRN: [id number]  HPI 6 week h/o sinus pressure, maxillary and fronta;l, bilateral ear pain ,sore throat and cough productive of thick sputum, Intermittent chills and subjective fever. Also c/o urinary frequency , bilateral flank pain and dysuria x 6 weeks, pain is mainly after urination on the outside Reports improvement in depression and requests reduction in dose of medication Inconsistent in weight loss attempts with no improvement    Review of Systems See HPI Denies recent fever or chills. Denies chest pains, palpitations and leg swelling Denies abdominal pain, nausea, vomiting,diarrhea or constipation.   Denies dysuria, frequency, hesitancy or incontinence. Denies joint pain, swelling and limitation in mobility. Denies headaches, seizures, numbness, or tingling. Denies uncontrolled  depression, anxiety or insomnia. Denies skin break down or rash.        Objective:   Physical Exam Patient alert and oriented and in no cardiopulmonary distress.  HEENT: No facial asymmetry, EOMI, frontal and maxillary sinus tenderness,  oropharynx pink and moist.  Neck supple no adenopathy.  Chest: Clear to auscultation bilaterally.  CVS: S1, S2 no murmurs, no S3.  ABD: Soft non tender. Bowel sounds normal.  Ext: No edema  MS: Adequate ROM spine, shoulders, hips and knees.  Skin: Intact, no ulcerations or rash noted.  Psych: Good eye contact, normal affect. Memory intact not anxious or depressed appearing.  CNS: CN 2-12 intact, power, tone and sensation normal throughout.        Assessment & Plan:

## 2012-05-17 ENCOUNTER — Other Ambulatory Visit: Payer: Self-pay | Admitting: Family Medicine

## 2012-05-18 ENCOUNTER — Other Ambulatory Visit: Payer: Self-pay | Admitting: Family Medicine

## 2012-05-20 NOTE — Assessment & Plan Note (Signed)
Increased symptoms with season change resume med on a daily basis

## 2012-05-20 NOTE — Assessment & Plan Note (Signed)
Antibiotic course prescribed 

## 2012-05-20 NOTE — Assessment & Plan Note (Signed)
Controlled, no change in medication DASH diet and commitment to daily physical activity for a minimum of 30 minutes discussed and encouraged, as a part of hypertension management. The importance of attaining a healthy weight is also discussed.  

## 2012-05-20 NOTE — Assessment & Plan Note (Signed)
Improved, reduce fluoxetine dose

## 2012-05-28 ENCOUNTER — Other Ambulatory Visit: Payer: Self-pay

## 2012-05-28 ENCOUNTER — Telehealth: Payer: Self-pay | Admitting: Family Medicine

## 2012-05-28 DIAGNOSIS — N390 Urinary tract infection, site not specified: Secondary | ICD-10-CM

## 2012-05-28 MED ORDER — FLUCONAZOLE 150 MG PO TABS: ORAL_TABLET | ORAL | Status: DC

## 2012-05-28 NOTE — Telephone Encounter (Signed)
Patient aware.

## 2012-05-28 NOTE — Telephone Encounter (Signed)
pls refill the fluconazole which  Was ordered and let her know

## 2012-05-28 NOTE — Telephone Encounter (Signed)
Med reordered.

## 2012-06-21 ENCOUNTER — Ambulatory Visit (INDEPENDENT_AMBULATORY_CARE_PROVIDER_SITE_OTHER): Payer: Self-pay | Admitting: Family Medicine

## 2012-06-21 ENCOUNTER — Encounter: Payer: Self-pay | Admitting: Family Medicine

## 2012-06-21 ENCOUNTER — Other Ambulatory Visit: Payer: Self-pay | Admitting: Family Medicine

## 2012-06-21 VITALS — BP 122/84 | HR 86 | Resp 15 | Ht 66.0 in | Wt 230.1 lb

## 2012-06-21 DIAGNOSIS — I10 Essential (primary) hypertension: Secondary | ICD-10-CM

## 2012-06-21 DIAGNOSIS — R5381 Other malaise: Secondary | ICD-10-CM

## 2012-06-21 DIAGNOSIS — J309 Allergic rhinitis, unspecified: Secondary | ICD-10-CM

## 2012-06-21 DIAGNOSIS — M545 Low back pain, unspecified: Secondary | ICD-10-CM | POA: Insufficient documentation

## 2012-06-21 DIAGNOSIS — N760 Acute vaginitis: Secondary | ICD-10-CM

## 2012-06-21 DIAGNOSIS — M899 Disorder of bone, unspecified: Secondary | ICD-10-CM

## 2012-06-21 DIAGNOSIS — N390 Urinary tract infection, site not specified: Secondary | ICD-10-CM

## 2012-06-21 DIAGNOSIS — R5383 Other fatigue: Secondary | ICD-10-CM

## 2012-06-21 DIAGNOSIS — M549 Dorsalgia, unspecified: Secondary | ICD-10-CM

## 2012-06-21 DIAGNOSIS — F329 Major depressive disorder, single episode, unspecified: Secondary | ICD-10-CM

## 2012-06-21 DIAGNOSIS — E785 Hyperlipidemia, unspecified: Secondary | ICD-10-CM

## 2012-06-21 DIAGNOSIS — M949 Disorder of cartilage, unspecified: Secondary | ICD-10-CM

## 2012-06-21 DIAGNOSIS — E669 Obesity, unspecified: Secondary | ICD-10-CM

## 2012-06-21 LAB — POCT URINALYSIS DIPSTICK
Blood, UA: NEGATIVE
Ketones, UA: NEGATIVE
Protein, UA: NEGATIVE
Spec Grav, UA: 1.015
pH, UA: 7.5

## 2012-06-21 MED ORDER — PREDNISONE (PAK) 5 MG PO TABS: 5.0000 mg | ORAL_TABLET | ORAL | Status: DC

## 2012-06-21 MED ORDER — TRIAMTERENE-HCTZ 75-50 MG PO TABS: 1.0000 | ORAL_TABLET | Freq: Every day | ORAL | Status: DC

## 2012-06-21 MED ORDER — LOVASTATIN 20 MG PO TABS: ORAL_TABLET | ORAL | Status: DC

## 2012-06-21 NOTE — Assessment & Plan Note (Signed)
Will wait on c/s result to treat

## 2012-06-21 NOTE — Assessment & Plan Note (Signed)
Improved, continue medication as before 

## 2012-06-21 NOTE — Assessment & Plan Note (Signed)
Controlled, no change in medication DASH diet and commitment to daily physical activity for a minimum of 30 minutes discussed and encouraged, as a part of hypertension management. The importance of attaining a healthy weight is also discussed.  

## 2012-06-21 NOTE — Assessment & Plan Note (Signed)
Deteriorated. Patient re-educated about  the importance of commitment to a  minimum of 150 minutes of exercise per week. The importance of healthy food choices with portion control discussed. Encouraged to start a food diary, count calories and to consider  joining a support group. Sample diet sheets offered. Goals set by the patient for the next several months.    

## 2012-06-21 NOTE — Assessment & Plan Note (Signed)
Foul smelling pruritic discharge for several months, will wai on results to treat, sent for wet prep and culture

## 2012-06-21 NOTE — Assessment & Plan Note (Signed)
Hyperlipidemia:Low fat diet discussed and encouraged.  Updated labs needed 

## 2012-06-21 NOTE — Patient Instructions (Addendum)
F/u in 4.5 month, call if you need me before please.  Medication sent in for uncontrolled allergies.  I will wait on culture reports to treat any possible infection, you will be contacted

## 2012-06-21 NOTE — Progress Notes (Signed)
  Subjective:    Patient ID: Sarah Coleman, female    DOB: Nov 26, 1950, 61 y.o.   MRN: [id number]  HPI 5 days ago pt lifted lawn mower, since then she has been having bilateral hip pain , non radiaiting rated at a 3 to 4, a little relief with OTC tylenol, advissed to take 3 tylenon daily for the next 5 days White smelly discharge x 1 week 3 day h/o loose watery stool with fecal soiling , states due to overeating apples she ahs been having loose stools and this is the problem   Review of Systems See HPI Denies recent fever or chills. Denies sinus pressure, nasal congestion, ear pain or sore throat. Denies chest congestion, productive cough or wheezing. Denies chest pains, palpitations and leg swelling Denies abdominal pain, nausea, vomiting,diarrhea or constipation.    Denies headaches, seizures, numbness, or tingling. Denies depression, anxiety or insomnia. Denies skin break down or rash.        Objective:   Physical Exam Patient alert and oriented and in no cardiopulmonary distress.  HEENT: No facial asymmetry, EOMI, no sinus tenderness,  oropharynx pink and moist.  Neck supple no adenopathy.  Chest: Clear to auscultation bilaterally.  CVS: S1, S2 no murmurs, no S3.  ABD: Soft non tender. Bowel sounds normal.No renal angle or suprapubic tenderness Pelvic: fishy white discharge, no ulcerative lesions noted Ext: No edema  MS: Adequate ROM spine, shoulders, hips and knees.  Skin: Intact, no ulcerations or rash noted.  Psych: Good eye contact, normal affect. Memory intact not anxious or depressed appearing.  CNS: CN 2-12 intact, power, tone and sensation normal throughout.        Assessment & Plan:

## 2012-06-21 NOTE — Assessment & Plan Note (Signed)
Increased due to recent strain, pt to use OTC tylenol for 5 days

## 2012-06-21 NOTE — Assessment & Plan Note (Signed)
Uncontrolled with increased symptoms in past 1 month

## 2012-06-22 ENCOUNTER — Other Ambulatory Visit: Payer: Self-pay

## 2012-06-22 ENCOUNTER — Other Ambulatory Visit: Payer: Self-pay | Admitting: Family Medicine

## 2012-06-22 MED ORDER — METRONIDAZOLE 500 MG PO TABS: ORAL_TABLET | ORAL | Status: DC

## 2012-06-23 LAB — URINE CULTURE

## 2012-06-25 LAB — GC/CHLAMYDIA PROBE AMP
CT Probe RNA: NEGATIVE
GC Probe RNA: NEGATIVE

## 2012-06-28 ENCOUNTER — Telehealth: Payer: Self-pay

## 2012-06-28 NOTE — Telephone Encounter (Signed)
She needs to complete the flagyll and she is advised against douching

## 2012-06-29 MED ORDER — FLUCONAZOLE 150 MG PO TABS: 150.0000 mg | ORAL_TABLET | Freq: Once | ORAL | Status: DC

## 2012-06-29 NOTE — Telephone Encounter (Signed)
Sent and pt aware  

## 2012-06-29 NOTE — Telephone Encounter (Signed)
She thinks she has a yeast infection now from the flagyl. Can she have fluconazole sent in ?

## 2012-06-29 NOTE — Telephone Encounter (Signed)
pls send 1 fluconazole 150mg  tablet only and let her know

## 2012-06-29 NOTE — Addendum Note (Signed)
Addended by: Abner Greenspan on: 06/29/2012 01:19 PM   Modules accepted: Orders

## 2012-07-17 ENCOUNTER — Ambulatory Visit: Payer: Self-pay | Admitting: Family Medicine

## 2012-10-25 ENCOUNTER — Ambulatory Visit: Payer: Self-pay | Admitting: Family Medicine

## 2012-11-01 ENCOUNTER — Ambulatory Visit (INDEPENDENT_AMBULATORY_CARE_PROVIDER_SITE_OTHER): Payer: BC Managed Care – PPO | Admitting: Family Medicine

## 2012-11-01 ENCOUNTER — Encounter: Payer: Self-pay | Admitting: Family Medicine

## 2012-11-01 ENCOUNTER — Other Ambulatory Visit (HOSPITAL_COMMUNITY)
Admission: RE | Admit: 2012-11-01 | Discharge: 2012-11-01 | Disposition: A | Discharge status: Home / Self Care | Payer: BC Managed Care – PPO | Source: Ambulatory Visit | Attending: Family Medicine | Admitting: Family Medicine

## 2012-11-01 VITALS — BP 118/88 | HR 95 | Resp 16 | Ht 66.0 in | Wt 237.0 lb

## 2012-11-01 DIAGNOSIS — R7301 Impaired fasting glucose: Secondary | ICD-10-CM

## 2012-11-01 DIAGNOSIS — I10 Essential (primary) hypertension: Secondary | ICD-10-CM

## 2012-11-01 DIAGNOSIS — E785 Hyperlipidemia, unspecified: Secondary | ICD-10-CM

## 2012-11-01 DIAGNOSIS — J309 Allergic rhinitis, unspecified: Secondary | ICD-10-CM

## 2012-11-01 DIAGNOSIS — K219 Gastro-esophageal reflux disease without esophagitis: Secondary | ICD-10-CM

## 2012-11-01 DIAGNOSIS — Z23 Encounter for immunization: Secondary | ICD-10-CM

## 2012-11-01 DIAGNOSIS — Z130 Encounter for screening for diseases of the blood and blood-forming organs and certain disorders involving the immune mechanism: Secondary | ICD-10-CM

## 2012-11-01 DIAGNOSIS — R5383 Other fatigue: Secondary | ICD-10-CM

## 2012-11-01 DIAGNOSIS — Z113 Encounter for screening for infections with a predominantly sexual mode of transmission: Secondary | ICD-10-CM | POA: Insufficient documentation

## 2012-11-01 DIAGNOSIS — Z2911 Encounter for prophylactic immunotherapy for respiratory syncytial virus (RSV): Secondary | ICD-10-CM

## 2012-11-01 DIAGNOSIS — F329 Major depressive disorder, single episode, unspecified: Secondary | ICD-10-CM

## 2012-11-01 DIAGNOSIS — N760 Acute vaginitis: Secondary | ICD-10-CM | POA: Insufficient documentation

## 2012-11-01 DIAGNOSIS — Z1211 Encounter for screening for malignant neoplasm of colon: Secondary | ICD-10-CM

## 2012-11-01 DIAGNOSIS — R079 Chest pain, unspecified: Secondary | ICD-10-CM | POA: Insufficient documentation

## 2012-11-01 DIAGNOSIS — Z1321 Encounter for screening for nutritional disorder: Secondary | ICD-10-CM

## 2012-11-01 DIAGNOSIS — E669 Obesity, unspecified: Secondary | ICD-10-CM

## 2012-11-01 DIAGNOSIS — M549 Dorsalgia, unspecified: Secondary | ICD-10-CM

## 2012-11-01 LAB — COMPREHENSIVE METABOLIC PANEL
ALT: 26 U/L (ref 0–35)
BUN: 11 mg/dL (ref 6–23)
CO2: 29 mEq/L (ref 19–32)
Calcium: 10.1 mg/dL (ref 8.4–10.5)
Chloride: 97 mEq/L (ref 96–112)
Creat: 0.85 mg/dL (ref 0.50–1.10)
Glucose, Bld: 106 mg/dL — ABNORMAL HIGH (ref 70–99)

## 2012-11-01 LAB — LIPID PANEL
Cholesterol: 186 mg/dL (ref 0–200)
Triglycerides: 334 mg/dL — ABNORMAL HIGH (ref <150)

## 2012-11-01 LAB — CBC WITH DIFFERENTIAL/PLATELET
Basophils Absolute: 0 10*3/uL (ref 0.0–0.1)
Basophils Relative: 0 % (ref 0–1)
MCHC: 34.6 g/dL (ref 30.0–36.0)
Neutro Abs: 3.5 10*3/uL (ref 1.7–7.7)
Neutrophils Relative %: 44 % (ref 43–77)
RDW: 15.8 % — ABNORMAL HIGH (ref 11.5–15.5)
WBC: 7.9 10*3/uL (ref 4.0–10.5)

## 2012-11-01 LAB — TSH: TSH: 1.314 u[IU]/mL (ref 0.350–4.500)

## 2012-11-01 MED ORDER — CYCLOBENZAPRINE HCL 10 MG PO TABS: ORAL_TABLET | ORAL | Status: DC

## 2012-11-01 MED ORDER — CETIRIZINE HCL 10 MG PO TBDP: 1.0000 | ORAL_TABLET | Freq: Every day | ORAL | Status: DC

## 2012-11-01 MED ORDER — FLUTICASONE PROPIONATE 50 MCG/ACT NA SUSP: 2.0000 | Freq: Every day | NASAL | Status: DC

## 2012-11-01 MED ORDER — PANTOPRAZOLE SODIUM 20 MG PO TBEC: 20.0000 mg | DELAYED_RELEASE_TABLET | Freq: Every day | ORAL | Status: DC

## 2012-11-01 MED ORDER — HYDROCODONE-ACETAMINOPHEN 5-325 MG PO TABS: ORAL_TABLET | ORAL | Status: DC

## 2012-11-01 NOTE — Patient Instructions (Addendum)
F/u in 4 month  Labs today, cbc, lipid, cmp, hBA1C and TSH and Vit D, and Hpylori  You have uncontrolled sinus allergies, continue daily zyrtec, new is sudafed one daily as needed, buy this OTC, also flonase nasal sparay daily  You will be contacted re results of swabs and treated once available.  Muscle relaxant and anti inflammatory for back pain , due to arhtiritis.  It is important that you exercise regularly at least 30 minutes 5 times a week. If you develop chest pain, have severe difficulty breathing, or feel very tired, stop exercising immediately and seek medical attention   A healthy diet is rich in fruit, vegetables and whole grains. Poultry fish, nuts and beans are a healthy choice for protein rather then red meat. A low sodium diet and drinking 64 ounces of water daily is generally recommended. Oils and sweet should be limited. Carbohydrates especially for those who are diabetic or overweight, should be limited to 34-45 gram per meal. It is important to eat on a regular schedule, at least 3 times daily. Snacks should be primarily fruits, vegetables or nuts.  Please sched your mammogram, you will get info with number to call  You are referred for colonoscopy  You need a shingles vaccine this will be given today Med sent in for reflux, this is the cause of your current chest discomfort I believe

## 2012-11-02 ENCOUNTER — Other Ambulatory Visit: Payer: Self-pay

## 2012-11-02 ENCOUNTER — Other Ambulatory Visit: Payer: Self-pay | Admitting: Family Medicine

## 2012-11-02 ENCOUNTER — Encounter (INDEPENDENT_AMBULATORY_CARE_PROVIDER_SITE_OTHER): Payer: Self-pay | Admitting: *Deleted

## 2012-11-02 ENCOUNTER — Telehealth: Payer: Self-pay

## 2012-11-02 DIAGNOSIS — F329 Major depressive disorder, single episode, unspecified: Secondary | ICD-10-CM

## 2012-11-02 DIAGNOSIS — I10 Essential (primary) hypertension: Secondary | ICD-10-CM

## 2012-11-02 LAB — VITAMIN D 25 HYDROXY (VIT D DEFICIENCY, FRACTURES): Vit D, 25-Hydroxy: 56 ng/mL (ref 30–89)

## 2012-11-02 LAB — HEMOGLOBIN A1C: Hgb A1c MFr Bld: 6.3 % — ABNORMAL HIGH (ref <5.7)

## 2012-11-02 MED ORDER — TRIAMTERENE-HCTZ 75-50 MG PO TABS: 1.0000 | ORAL_TABLET | Freq: Every day | ORAL | Status: DC

## 2012-11-02 MED ORDER — LOVASTATIN 20 MG PO TABS: ORAL_TABLET | ORAL | Status: DC

## 2012-11-02 MED ORDER — ONDANSETRON HCL 4 MG PO TABS: 4.0000 mg | ORAL_TABLET | Freq: Every day | ORAL | Status: DC | PRN

## 2012-11-02 MED ORDER — FLUOXETINE HCL 10 MG PO CAPS: ORAL_CAPSULE | ORAL | Status: DC

## 2012-11-02 NOTE — Telephone Encounter (Signed)
Patient aware.

## 2012-11-02 NOTE — Telephone Encounter (Signed)
Petobismol, or immodium  oTC, I will send zofran #10 tabs pls slet her know. Also review BRAT diet with her

## 2012-11-04 NOTE — Assessment & Plan Note (Signed)
C/o sevreal month h/o malodorous vag d/c . Specimens sent for testing

## 2012-11-04 NOTE — Assessment & Plan Note (Signed)
Atypical chest pain by history, EKG shows no acute ischemia

## 2012-11-04 NOTE — Assessment & Plan Note (Signed)
Uncontrolled start muscle relaxant

## 2012-11-04 NOTE — Assessment & Plan Note (Signed)
Uncontrolled, pt to start as needed sudafed

## 2012-11-04 NOTE — Assessment & Plan Note (Signed)
Controlled, no change in medication  

## 2012-11-04 NOTE — Progress Notes (Signed)
  Subjective:    Patient ID: Sarah Coleman, female    DOB: 1951-02-22, 62 y.o.   MRN: [id number]  HPI The PT is here for follow up and re-evaluation of chronic medical conditions, medication management and review of any available recent lab and radiology data.  Preventive health is updated, specifically  Cancer screening and Immunization.    The PT denies any adverse reactions to current medications since the last visit.  C/o uncontrolled back pain as well as increased nasal congestion with clear drainage, no fevr , chills or facial pain.No recent aggravating trauma to explain increased back pain Several month h/o malodorous vaginal discharge C/o substernal chest pain with and without activity, non radiating, no associated nausea or diaphoresis, increased in frequency in the past 2 month, average 2 to 3 times per week, seems to occur when lying down at times     Review of Systems See HPI Denies recent fever or chills. Denies sinus pressure, , ear pain or sore throat. Denies chest congestion, productive cough or wheezing.Does have irritative cough C/o intermittent  chest pains, palpitations and leg swelling Denies abdominal pain, nausea, vomiting,diarrhea or constipation.   Denies dysuria, frequency, hesitancy or incontinence. C/o increased back pain and limitation in mobility. Denies headaches, seizures, numbness, or tingling. Denies depression, anxiety or insomnia. Denies skin break down or rash.        Objective:   Physical Exam  Patient alert and oriented and in no cardiopulmonary distress.  HEENT: No facial asymmetry, EOMI, no sinus tenderness,  oropharynx pink and moist.  Neck supple no adenopathy.  Chest: Clear to auscultation bilaterally.  CVS: S1, S2 no murmurs, no S3.  ABD: Soft non tender. Bowel sounds normal. Pelvic: malodorous vaginal discharge, womb absent , no adnexal masses Ext: No edema  MS: Adequate ROM spine, shoulders, hips and knees.  Skin:  Intact, no ulcerations or rash noted.  Psych: Good eye contact, normal affect. Memory intact not anxious or depressed appearing.  CNS: CN 2-12 intact, power, tone and sensation normal throughout.       Assessment & Plan:

## 2012-11-04 NOTE — Assessment & Plan Note (Signed)
Controlled, no change in medication DASH diet and commitment to daily physical activity for a minimum of 30 minutes discussed and encouraged, as a part of hypertension management. The importance of attaining a healthy weight is also discussed.  

## 2012-11-04 NOTE — Assessment & Plan Note (Signed)
Deteriorated. Patient re-educated about  the importance of commitment to a  minimum of 150 minutes of exercise per week. The importance of healthy food choices with portion control discussed. Encouraged to start a food diary, count calories and to consider  joining a support group. Sample diet sheets offered. Goals set by the patient for the next several months.    

## 2012-11-04 NOTE — Assessment & Plan Note (Signed)
Hyperlipidemia:Low fat diet discussed and encouraged.  Updated lab needed 

## 2012-11-13 ENCOUNTER — Telehealth: Payer: Self-pay | Admitting: Family Medicine

## 2012-11-13 DIAGNOSIS — Z113 Encounter for screening for infections with a predominantly sexual mode of transmission: Secondary | ICD-10-CM

## 2012-11-13 NOTE — Telephone Encounter (Signed)
Called and left message for patient to return call.  

## 2012-11-13 NOTE — Telephone Encounter (Signed)
Message left for pt

## 2012-11-13 NOTE — Telephone Encounter (Signed)
pls advise her to take one every other day for next 2 weeks, then stop

## 2012-11-13 NOTE — Telephone Encounter (Signed)
Patient going today for HSV test. Wants to come off the prozac. Will taper off slowly unless you say otherwise

## 2012-11-14 LAB — HSV 2 ANTIBODY, IGG: HSV 2 Glycoprotein G Ab, IgG: <0.1 IV

## 2012-11-14 NOTE — Telephone Encounter (Signed)
Pt aware to have labs  

## 2012-11-16 ENCOUNTER — Encounter: Payer: Self-pay | Admitting: Family Medicine

## 2012-11-20 ENCOUNTER — Telehealth: Payer: Self-pay | Admitting: Family Medicine

## 2012-11-21 ENCOUNTER — Telehealth: Payer: Self-pay | Admitting: Family Medicine

## 2012-11-23 ENCOUNTER — Other Ambulatory Visit: Payer: Self-pay | Admitting: Family Medicine

## 2012-11-23 NOTE — Telephone Encounter (Signed)
Patient aware.

## 2012-11-26 ENCOUNTER — Other Ambulatory Visit: Payer: Self-pay

## 2012-11-26 ENCOUNTER — Telehealth: Payer: Self-pay | Admitting: Family Medicine

## 2012-11-26 MED ORDER — ESTROGENS, CONJUGATED 0.625 MG/GM VA CREA: 0.5000 g | TOPICAL_CREAM | Freq: Every day | VAGINAL | Status: DC

## 2012-11-27 NOTE — Telephone Encounter (Signed)
Patient aware of results.

## 2013-01-01 ENCOUNTER — Other Ambulatory Visit: Payer: Self-pay | Admitting: Family Medicine

## 2013-01-25 ENCOUNTER — Other Ambulatory Visit: Payer: Self-pay

## 2013-01-25 ENCOUNTER — Telehealth: Payer: Self-pay

## 2013-01-25 DIAGNOSIS — J309 Allergic rhinitis, unspecified: Secondary | ICD-10-CM

## 2013-01-25 MED ORDER — BENZONATATE 100 MG PO CAPS: 100.0000 mg | ORAL_CAPSULE | Freq: Three times a day (TID) | ORAL | Status: DC | PRN

## 2013-01-25 NOTE — Telephone Encounter (Signed)
Patient aware.

## 2013-01-25 NOTE — Telephone Encounter (Signed)
Send in tessalon perles 1 three times daily for 10 days and advise a lot of fluid intake please

## 2013-03-07 ENCOUNTER — Ambulatory Visit: Payer: BC Managed Care – PPO | Admitting: Family Medicine

## 2013-04-05 ENCOUNTER — Encounter: Payer: Self-pay | Admitting: Family Medicine

## 2013-04-05 ENCOUNTER — Ambulatory Visit (INDEPENDENT_AMBULATORY_CARE_PROVIDER_SITE_OTHER): Payer: BC Managed Care – PPO | Admitting: Family Medicine

## 2013-04-05 VITALS — BP 138/80 | HR 72 | Resp 16 | Ht 66.0 in | Wt 226.1 lb

## 2013-04-05 DIAGNOSIS — R7301 Impaired fasting glucose: Secondary | ICD-10-CM

## 2013-04-05 DIAGNOSIS — K219 Gastro-esophageal reflux disease without esophagitis: Secondary | ICD-10-CM

## 2013-04-05 DIAGNOSIS — I10 Essential (primary) hypertension: Secondary | ICD-10-CM

## 2013-04-05 DIAGNOSIS — M549 Dorsalgia, unspecified: Secondary | ICD-10-CM

## 2013-04-05 DIAGNOSIS — M25569 Pain in unspecified knee: Secondary | ICD-10-CM

## 2013-04-05 DIAGNOSIS — M25561 Pain in right knee: Secondary | ICD-10-CM

## 2013-04-05 DIAGNOSIS — E669 Obesity, unspecified: Secondary | ICD-10-CM

## 2013-04-05 DIAGNOSIS — R7309 Other abnormal glucose: Secondary | ICD-10-CM

## 2013-04-05 DIAGNOSIS — E785 Hyperlipidemia, unspecified: Secondary | ICD-10-CM

## 2013-04-05 DIAGNOSIS — Z1239 Encounter for other screening for malignant neoplasm of breast: Secondary | ICD-10-CM

## 2013-04-05 DIAGNOSIS — J309 Allergic rhinitis, unspecified: Secondary | ICD-10-CM

## 2013-04-05 DIAGNOSIS — R7303 Prediabetes: Secondary | ICD-10-CM

## 2013-04-05 LAB — BASIC METABOLIC PANEL
BUN: 13 mg/dL (ref 6–23)
CO2: 27 mEq/L (ref 19–32)
Calcium: 9.7 mg/dL (ref 8.4–10.5)
Creat: 1.01 mg/dL (ref 0.50–1.10)
Glucose, Bld: 95 mg/dL (ref 70–99)

## 2013-04-05 MED ORDER — FLUTICASONE PROPIONATE 50 MCG/ACT NA SUSP: 2.0000 | Freq: Every day | NASAL | Status: DC

## 2013-04-05 MED ORDER — METFORMIN HCL ER (MOD) 500 MG PO TB24: 500.0000 mg | ORAL_TABLET | Freq: Every day | ORAL | Status: DC

## 2013-04-05 MED ORDER — TRIAMTERENE-HCTZ 75-50 MG PO TABS: 1.0000 | ORAL_TABLET | Freq: Every day | ORAL | Status: DC

## 2013-04-05 MED ORDER — HYDROCODONE-ACETAMINOPHEN 5-300 MG PO TABS: ORAL_TABLET | ORAL | Status: DC

## 2013-04-05 MED ORDER — LOVASTATIN 20 MG PO TABS: ORAL_TABLET | ORAL | Status: DC

## 2013-04-05 MED ORDER — CETIRIZINE HCL 10 MG PO TBDP: 1.0000 | ORAL_TABLET | Freq: Every day | ORAL | Status: DC

## 2013-04-05 NOTE — Assessment & Plan Note (Signed)
Controlled, no change in medication  

## 2013-04-05 NOTE — Assessment & Plan Note (Signed)
Increased requesting vicodin for as needed use one at bedtime.Counseled to use sparingly, I anticipate 30 tabs to last 4 months

## 2013-04-05 NOTE — Assessment & Plan Note (Signed)
Controlled, no change in medication DASH diet and commitment to daily physical activity for a minimum of 30 minutes discussed and encouraged, as a part of hypertension management. The importance of attaining a healthy weight is also discussed.  

## 2013-04-05 NOTE — Patient Instructions (Addendum)
CPE  in early December, call if you need me before  Call in October for flu vaccine  HBA1C, chem 7 today  Fasting lipid, cmp and hBA1C in December before next visit   NEW for pre diabetes is metformin one tablet at breakfast every morning  OK to use vicodin 1 at bedtime for severe back pain, use tylenol one daily for arthiitis pain in knee and back  We will schedule your mammogram per your request

## 2013-04-05 NOTE — Assessment & Plan Note (Signed)
Patient educated about the importance of limiting  Carbohydrate intake , the need to commit to daily physical activity for a minimum of 30 minutes , and to commit weight loss. The fact that changes in all these areas will reduce or eliminate all together the development of diabetes is stressed.   Pt to start metformin  

## 2013-04-05 NOTE — Progress Notes (Signed)
  Subjective:    Patient ID: Sarah Coleman, female    DOB: September 18, 1950, 62 y.o.   MRN: [id number]  HPI  The PT is here for follow up and re-evaluation of chronic medical conditions, medication management and review of any available recent lab and radiology data.  Preventive health is updated, specifically  Cancer screening and Immunization.   Has worked  successfully on weight loss with 11 pound success. The PT denies any adverse reactions to current medications since the last visit.  C/o inability to fully bend right knee, wants no medication, duration approx 6 weeks, will work on continued weight loss and exercise. Denies depression and has stopped fluoxetine. Did not understand the need for metformin and never took script, she is prediabetic.Will start now. Requests vicodin for as needed use for back pain     Review of Systems See HPI Denies recent fever or chills. Denies sinus pressure, nasal congestion, ear pain or sore throat. Denies chest congestion, productive cough or wheezing. Denies chest pains, palpitations and leg swelling Denies abdominal pain, nausea, vomiting,diarrhea or constipation.   Denies dysuria, frequency, hesitancy or incontinence.  Denies headaches, seizures, numbness, or tingling. Denies depression, anxiety or insomnia. Denies skin break down or rash.        Objective:   Physical Exam  Patient alert and oriented and in no cardiopulmonary distress.  HEENT: No facial asymmetry, EOMI, no sinus tenderness,  oropharynx pink and moist.  Neck supple no adenopathy.  Chest: Clear to auscultation bilaterally.  CVS: S1, S2 no murmurs, no S3.  ABD: Soft non tender. Bowel sounds normal.  Ext: No edema  WG:[id number] though  Adequate ROM spine, shoulders, hips and reduced in right knee.  Skin: Intact, no ulcerations or rash noted.  Psych: Good eye contact, normal affect. Memory intact not anxious or depressed appearing.  CNS: CN 2-12 intact, power,  tone and sensation normal throughout.       Assessment & Plan:

## 2013-04-05 NOTE — Assessment & Plan Note (Signed)
Unchanged, will work on weight loss

## 2013-04-05 NOTE — Assessment & Plan Note (Signed)
Hyperlipidemia:Low fat diet discussed and encouraged.  Uncontrolled, rept in 4 month

## 2013-04-05 NOTE — Assessment & Plan Note (Signed)
Improved. Pt applauded on succesful weight loss through lifestyle change, and encouraged to continue same. Weight loss goal set for the next several months.  

## 2013-04-11 ENCOUNTER — Ambulatory Visit (HOSPITAL_COMMUNITY)
Admission: RE | Admit: 2013-04-11 | Discharge: 2013-04-11 | Disposition: A | Discharge status: Home / Self Care | Payer: BC Managed Care – PPO | Source: Ambulatory Visit | Attending: Family Medicine | Admitting: Family Medicine

## 2013-04-11 DIAGNOSIS — Z1231 Encounter for screening mammogram for malignant neoplasm of breast: Secondary | ICD-10-CM | POA: Insufficient documentation

## 2013-04-11 DIAGNOSIS — Z1239 Encounter for other screening for malignant neoplasm of breast: Secondary | ICD-10-CM

## 2013-04-23 ENCOUNTER — Ambulatory Visit (INDEPENDENT_AMBULATORY_CARE_PROVIDER_SITE_OTHER): Payer: BC Managed Care – PPO

## 2013-04-23 DIAGNOSIS — Z23 Encounter for immunization: Secondary | ICD-10-CM

## 2013-06-13 ENCOUNTER — Other Ambulatory Visit: Payer: Self-pay | Admitting: Family Medicine

## 2013-07-18 LAB — LIPID PANEL
Cholesterol: 192 mg/dL (ref 0–200)
HDL: 34 mg/dL — ABNORMAL LOW (ref >39)
Total CHOL/HDL Ratio: 5.6 Ratio

## 2013-07-18 LAB — COMPREHENSIVE METABOLIC PANEL
ALT: 17 U/L (ref 0–35)
AST: 17 U/L (ref 0–37)
Creat: 0.95 mg/dL (ref 0.50–1.10)
Total Bilirubin: 0.6 mg/dL (ref 0.3–1.2)

## 2013-07-18 LAB — HEMOGLOBIN A1C
Hgb A1c MFr Bld: 6 % — ABNORMAL HIGH (ref <5.7)
Mean Plasma Glucose: 126 mg/dL — ABNORMAL HIGH (ref <117)

## 2013-07-22 ENCOUNTER — Encounter (INDEPENDENT_AMBULATORY_CARE_PROVIDER_SITE_OTHER): Payer: Self-pay

## 2013-07-22 ENCOUNTER — Ambulatory Visit (INDEPENDENT_AMBULATORY_CARE_PROVIDER_SITE_OTHER): Payer: BC Managed Care – PPO | Admitting: Family Medicine

## 2013-07-22 ENCOUNTER — Encounter: Payer: Self-pay | Admitting: Family Medicine

## 2013-07-22 VITALS — BP 124/78 | HR 92 | Resp 18 | Ht 66.0 in | Wt 224.1 lb

## 2013-07-22 DIAGNOSIS — R7309 Other abnormal glucose: Secondary | ICD-10-CM

## 2013-07-22 DIAGNOSIS — J309 Allergic rhinitis, unspecified: Secondary | ICD-10-CM

## 2013-07-22 DIAGNOSIS — M25512 Pain in left shoulder: Secondary | ICD-10-CM

## 2013-07-22 DIAGNOSIS — I10 Essential (primary) hypertension: Secondary | ICD-10-CM

## 2013-07-22 DIAGNOSIS — K219 Gastro-esophageal reflux disease without esophagitis: Secondary | ICD-10-CM

## 2013-07-22 DIAGNOSIS — E669 Obesity, unspecified: Secondary | ICD-10-CM

## 2013-07-22 DIAGNOSIS — R7303 Prediabetes: Secondary | ICD-10-CM

## 2013-07-22 DIAGNOSIS — M62838 Other muscle spasm: Secondary | ICD-10-CM

## 2013-07-22 DIAGNOSIS — M25519 Pain in unspecified shoulder: Secondary | ICD-10-CM

## 2013-07-22 DIAGNOSIS — E785 Hyperlipidemia, unspecified: Secondary | ICD-10-CM

## 2013-07-22 MED ORDER — METHYLPREDNISOLONE ACETATE 80 MG/ML IJ SUSP
80.0000 mg | Freq: Once | INTRAMUSCULAR | Status: AC
Start: 2013-07-22 — End: 2013-07-22
  Administered 2013-07-22: 80 mg via INTRAMUSCULAR

## 2013-07-22 MED ORDER — TRIAMTERENE-HCTZ 75-50 MG PO TABS: 1.0000 | ORAL_TABLET | Freq: Every day | ORAL | Status: DC

## 2013-07-22 MED ORDER — LOVASTATIN 20 MG PO TABS: ORAL_TABLET | ORAL | Status: DC

## 2013-07-22 MED ORDER — PREDNISONE (PAK) 10 MG PO TABS: ORAL_TABLET | Freq: Every day | ORAL | Status: AC

## 2013-07-22 MED ORDER — CETIRIZINE HCL 10 MG PO TBDP: 1.0000 | ORAL_TABLET | Freq: Every day | ORAL | Status: DC

## 2013-07-22 MED ORDER — CYCLOBENZAPRINE HCL 10 MG PO TABS: 10.0000 mg | ORAL_TABLET | Freq: Every day | ORAL | Status: AC

## 2013-07-22 MED ORDER — METFORMIN HCL ER (MOD) 500 MG PO TB24: 500.0000 mg | ORAL_TABLET | Freq: Every day | ORAL | Status: DC

## 2013-07-22 MED ORDER — METFORMIN HCL ER 500 MG PO TB24
500.0000 mg | ORAL_TABLET | Freq: Every day | ORAL | Status: DC
Start: 2013-07-22 — End: 2014-02-22

## 2013-07-22 MED ORDER — PANTOPRAZOLE SODIUM 20 MG PO TBEC: DELAYED_RELEASE_TABLET | ORAL | Status: DC

## 2013-07-22 NOTE — Progress Notes (Signed)
   Subjective:    Patient ID: Sarah Coleman, female    DOB: 1951/04/17, 62 y.o.   MRN: [id number]  HPI The PT is here for follow up and re-evaluation of chronic medical conditions, medication management and review of any available recent lab and radiology data.  Preventive health is updated, specifically  Cancer screening and Immunization.    The PT denies any adverse reactions to current medications since the last visit.   3 week h/o neck pain and spasm, radiating down left upper extremity, no specific aggravating factor noted for current flare, has ahd similar problem in the past. Inconsistent exercise and change in diet and exercise with little improvement in blood sugar and no weight change C/o increased and uncontrolled allergy symptoms of nasal drainage , sneezing and itchy eyes, no fever or chills       Review of Systems See HPI Denies recent fever or chills.  Denies chest congestion, productive cough or wheezing. Denies chest pains, palpitations and leg swelling Denies abdominal pain, nausea, vomiting,diarrhea or constipation.   Denies dysuria, frequency, hesitancy or incontinence.  Denies headaches, seizures, numbness, or tingling. Denies depression, anxiety or insomnia. Denies skin break down or rash.        Objective:   Physical Exam Patient alert and oriented and in no cardiopulmonary distress.  HEENT: No facial asymmetry, EOMI, no sinus tenderness,  oropharynx pink and moist.  Neck decreased ROM with left trapezius spasm,  no adenopathy.Bilateral conjunctival injection and erythema and edema of nasal mucosa  Chest: Clear to auscultation bilaterally.  CVS: S1, S2 no murmurs, no S3.  ABD: Soft non tender. Bowel sounds normal.  Ext: No edema  MS: Adequate ROM spine,, hips and knees.Decrease ROM let shoulder due to pain  Skin: Intact, no ulcerations or rash noted.  Psych: Good eye contact, normal affect. Memory intact not anxious or depressed  appearing.  CNS: CN 2-12 intact, power, tone and sensation normal throughout.        Assessment & Plan:

## 2013-07-22 NOTE — Patient Instructions (Addendum)
CPE and pap in 4 month.Call idf you need me before  Depo medrol 80 mg IM in the office today and prednisone for 5 additional days for neck and back pain  Flexeril one at night as needed for spasm Neck and low back massage twice daily for  The next 10 days, and warm alternating with cool compress to areas which hurt.   Please start aspirin 81mg  one daily to reduce stroke risk  OK to take sudafed daily for 3 to 4 days for excessive nasal drainage  CBc, fasting lipid, cmp HBA1C, and TSH in 4 month  Sugar a little better, and fat is TOO high, stop butter and cheese

## 2013-07-28 DIAGNOSIS — M25512 Pain in left shoulder: Secondary | ICD-10-CM | POA: Insufficient documentation

## 2013-07-28 NOTE — Assessment & Plan Note (Signed)
Marked elevation in TG, ditary change needed Hyperlipidemia:Low fat diet discussed and encouraged.  increae physical activity to improve HDl

## 2013-07-28 NOTE — Assessment & Plan Note (Signed)
Uncontrlled, depo medrol in office followed by oral prednisone

## 2013-07-28 NOTE — Assessment & Plan Note (Signed)
Controlled, no change in medication DASH diet and commitment to daily physical activity for a minimum of 30 minutes discussed and encouraged, as a part of hypertension management. The importance of attaining a healthy weight is also discussed.  

## 2013-07-28 NOTE — Assessment & Plan Note (Signed)
Increased and uncontrolled x 2 weeks , resume muscle relaxant as needed

## 2013-07-28 NOTE — Assessment & Plan Note (Signed)
Slight improvement but still not normal Patient educated about the importance of limiting  Carbohydrate intake , the need to commit to daily physical activity for a minimum of 30 minutes , and to commit weight loss. The fact that changes in all these areas will reduce or eliminate all together the development of diabetes is stressed.

## 2013-07-28 NOTE — Assessment & Plan Note (Signed)
Uncontrolled, depo medrol in office and short course of prednisone 

## 2013-07-28 NOTE — Assessment & Plan Note (Signed)
Deteriorated. Patient re-educated about  the importance of commitment to a  minimum of 150 minutes of exercise per week. The importance of healthy food choices with portion control discussed. Encouraged to start a food diary, count calories and to consider  joining a support group. Sample diet sheets offered. Goals set by the patient for the next several months.    

## 2013-07-28 NOTE — Assessment & Plan Note (Signed)
Controlled, no change in medication  

## 2013-11-15 ENCOUNTER — Encounter: Payer: BC Managed Care – PPO | Admitting: Family Medicine

## 2013-11-29 LAB — CBC
HCT: 36.7 % (ref 36.0–46.0)
Hemoglobin: 12.7 g/dL (ref 12.0–15.0)
MCH: 30.5 pg (ref 26.0–34.0)
MCHC: 34.6 g/dL (ref 30.0–36.0)
MCV: 88.2 fL (ref 78.0–100.0)
Platelets: 279 10*3/uL (ref 150–400)
RBC: 4.16 MIL/uL (ref 3.87–5.11)
RDW: 14.8 % (ref 11.5–15.5)
WBC: 8.2 10*3/uL (ref 4.0–10.5)

## 2013-11-29 LAB — LIPID PANEL
Cholesterol: 162 mg/dL (ref 0–200)
HDL: 42 mg/dL (ref >39)
LDL Cholesterol: 70 mg/dL (ref 0–99)
Total CHOL/HDL Ratio: 3.9 Ratio
Triglycerides: 249 mg/dL — ABNORMAL HIGH (ref <150)
VLDL: 50 mg/dL — ABNORMAL HIGH (ref 0–40)

## 2013-11-29 LAB — COMPREHENSIVE METABOLIC PANEL
ALT: 16 U/L (ref 0–35)
AST: 17 U/L (ref 0–37)
Albumin: 4.6 g/dL (ref 3.5–5.2)
Alkaline Phosphatase: 65 U/L (ref 39–117)
BUN: 13 mg/dL (ref 6–23)
CO2: 29 mEq/L (ref 19–32)
Calcium: 10 mg/dL (ref 8.4–10.5)
Chloride: 97 mEq/L (ref 96–112)
Creat: 0.94 mg/dL (ref 0.50–1.10)
Glucose, Bld: 111 mg/dL — ABNORMAL HIGH (ref 70–99)
Potassium: 3.5 mEq/L (ref 3.5–5.3)
Sodium: 138 mEq/L (ref 135–145)
Total Bilirubin: 0.5 mg/dL (ref 0.2–1.2)
Total Protein: 7.5 g/dL (ref 6.0–8.3)

## 2013-11-29 LAB — HEMOGLOBIN A1C
Hgb A1c MFr Bld: 6 % — ABNORMAL HIGH (ref <5.7)
Mean Plasma Glucose: 126 mg/dL — ABNORMAL HIGH (ref <117)

## 2013-11-30 LAB — TSH: TSH: 1.131 u[IU]/mL (ref 0.350–4.500)

## 2013-12-04 ENCOUNTER — Other Ambulatory Visit (HOSPITAL_COMMUNITY)
Admission: RE | Admit: 2013-12-04 | Discharge: 2013-12-04 | Disposition: A | Discharge status: Home / Self Care | Payer: BC Managed Care – PPO | Source: Ambulatory Visit | Attending: Family Medicine | Admitting: Family Medicine

## 2013-12-04 ENCOUNTER — Encounter: Payer: Self-pay | Admitting: Family Medicine

## 2013-12-04 ENCOUNTER — Encounter (INDEPENDENT_AMBULATORY_CARE_PROVIDER_SITE_OTHER): Payer: Self-pay

## 2013-12-04 ENCOUNTER — Ambulatory Visit (INDEPENDENT_AMBULATORY_CARE_PROVIDER_SITE_OTHER): Payer: BC Managed Care – PPO | Admitting: Family Medicine

## 2013-12-04 VITALS — BP 122/80 | HR 82 | Resp 16 | Ht 66.0 in | Wt 228.4 lb

## 2013-12-04 DIAGNOSIS — Z0000 Encounter for general adult medical examination without abnormal findings: Secondary | ICD-10-CM

## 2013-12-04 DIAGNOSIS — M549 Dorsalgia, unspecified: Secondary | ICD-10-CM

## 2013-12-04 DIAGNOSIS — Z01419 Encounter for gynecological examination (general) (routine) without abnormal findings: Secondary | ICD-10-CM | POA: Insufficient documentation

## 2013-12-04 DIAGNOSIS — E8881 Metabolic syndrome: Secondary | ICD-10-CM

## 2013-12-04 DIAGNOSIS — Z1212 Encounter for screening for malignant neoplasm of rectum: Secondary | ICD-10-CM

## 2013-12-04 DIAGNOSIS — Z1151 Encounter for screening for human papillomavirus (HPV): Secondary | ICD-10-CM | POA: Diagnosis present

## 2013-12-04 DIAGNOSIS — Z1211 Encounter for screening for malignant neoplasm of colon: Secondary | ICD-10-CM

## 2013-12-04 DIAGNOSIS — J309 Allergic rhinitis, unspecified: Secondary | ICD-10-CM

## 2013-12-04 DIAGNOSIS — Z113 Encounter for screening for infections with a predominantly sexual mode of transmission: Secondary | ICD-10-CM | POA: Insufficient documentation

## 2013-12-04 DIAGNOSIS — R7303 Prediabetes: Secondary | ICD-10-CM

## 2013-12-04 DIAGNOSIS — R51 Headache: Secondary | ICD-10-CM

## 2013-12-04 DIAGNOSIS — R7309 Other abnormal glucose: Secondary | ICD-10-CM

## 2013-12-04 DIAGNOSIS — N760 Acute vaginitis: Secondary | ICD-10-CM | POA: Insufficient documentation

## 2013-12-04 DIAGNOSIS — M62838 Other muscle spasm: Secondary | ICD-10-CM

## 2013-12-04 DIAGNOSIS — Z124 Encounter for screening for malignant neoplasm of cervix: Secondary | ICD-10-CM

## 2013-12-04 DIAGNOSIS — E785 Hyperlipidemia, unspecified: Secondary | ICD-10-CM

## 2013-12-04 LAB — POC HEMOCCULT BLD/STL (OFFICE/1-CARD/DIAGNOSTIC): Fecal Occult Blood, POC: NEGATIVE

## 2013-12-04 MED ORDER — FLUCONAZOLE 150 MG PO TABS: ORAL_TABLET | ORAL | Status: DC

## 2013-12-04 MED ORDER — FLUTICASONE PROPIONATE 50 MCG/ACT NA SUSP: 2.0000 | Freq: Every day | NASAL | Status: DC

## 2013-12-04 MED ORDER — CYCLOBENZAPRINE HCL 10 MG PO TABS: 10.0000 mg | ORAL_TABLET | Freq: Every day | ORAL | Status: DC

## 2013-12-04 MED ORDER — METHYLPREDNISOLONE ACETATE 80 MG/ML IJ SUSP
80.0000 mg | Freq: Once | INTRAMUSCULAR | Status: AC
  Administered 2013-12-04: 80 mg via INTRAMUSCULAR

## 2013-12-04 MED ORDER — PANTOPRAZOLE SODIUM 20 MG PO TBEC: DELAYED_RELEASE_TABLET | ORAL | Status: DC

## 2013-12-04 MED ORDER — PREDNISONE (PAK) 5 MG PO TABS: 5.0000 mg | ORAL_TABLET | ORAL | Status: DC

## 2013-12-04 NOTE — Patient Instructions (Addendum)
F/u in 4 month, call if you need me before  Tylenol 2 tabs in office today for headache  Depo medrol 80 mg IM in office for uncontrolled allergies , and 6 day course of prednisone is sent allergies. Use mask outside and wash off when you come in from outside  Muscle relaxant for bedtime use as needed, for back spasm   It is important that you exercise regularly at least 30 minutes 5 times a week. If you develop chest pain, have severe difficulty breathing, or feel very tired, stop exercising immediately and seek medical attention   A healthy diet is rich in fruit, vegetables and whole grains. Poultry fish, nuts and beans are a healthy choice for protein rather then red meat. A low sodium diet and drinking 64 ounces of water daily is generally recommended. Oils and sweet should be limited. Carbohydrates especially for those who are diabetic or overweight, should be limited to 60-45 gram per meal. It is important to eat on a regular schedule, at least 3 times daily. Snacks should be primarily fruits, vegetables or nuts.  Weight loss goal of 4 to 6 pounds    HBA1C, fasting lipid, cmp and eGFR in 4 month

## 2013-12-05 LAB — CERVICOVAGINAL ANCILLARY ONLY
Wet Prep (BD Affirm): NEGATIVE
Wet Prep (BD Affirm): NEGATIVE
Wet Prep (BD Affirm): POSITIVE — AB

## 2013-12-09 ENCOUNTER — Other Ambulatory Visit: Payer: Self-pay

## 2013-12-09 MED ORDER — METRONIDAZOLE 500 MG PO TABS: ORAL_TABLET | ORAL | Status: DC

## 2013-12-12 ENCOUNTER — Telehealth: Payer: Self-pay

## 2013-12-12 ENCOUNTER — Other Ambulatory Visit (HOSPITAL_COMMUNITY)
Admission: RE | Admit: 2013-12-12 | Discharge: 2013-12-12 | Disposition: A | Discharge status: Home / Self Care | Payer: BC Managed Care – PPO | Source: Ambulatory Visit | Attending: Family Medicine | Admitting: Family Medicine

## 2013-12-12 DIAGNOSIS — N760 Acute vaginitis: Secondary | ICD-10-CM

## 2013-12-12 NOTE — Telephone Encounter (Signed)
Affirm specimen collected at patient's request   Verbal orders given

## 2013-12-13 LAB — CERVICOVAGINAL ANCILLARY ONLY
WET PREP (BD AFFIRM): NEGATIVE
WET PREP (BD AFFIRM): NEGATIVE
WET PREP (BD AFFIRM): NEGATIVE

## 2013-12-26 ENCOUNTER — Other Ambulatory Visit (HOSPITAL_COMMUNITY)
Admission: RE | Admit: 2013-12-26 | Discharge: 2013-12-26 | Disposition: A | Discharge status: Home / Self Care | Payer: BC Managed Care – PPO | Source: Ambulatory Visit | Attending: Family Medicine | Admitting: Family Medicine

## 2013-12-26 ENCOUNTER — Ambulatory Visit (INDEPENDENT_AMBULATORY_CARE_PROVIDER_SITE_OTHER): Payer: BC Managed Care – PPO | Admitting: Family Medicine

## 2013-12-26 ENCOUNTER — Encounter: Payer: Self-pay | Admitting: Family Medicine

## 2013-12-26 VITALS — BP 128/86 | HR 94 | Resp 18 | Ht 66.0 in | Wt 226.1 lb

## 2013-12-26 DIAGNOSIS — I10 Essential (primary) hypertension: Secondary | ICD-10-CM

## 2013-12-26 DIAGNOSIS — Z113 Encounter for screening for infections with a predominantly sexual mode of transmission: Secondary | ICD-10-CM | POA: Insufficient documentation

## 2013-12-26 DIAGNOSIS — N760 Acute vaginitis: Secondary | ICD-10-CM | POA: Insufficient documentation

## 2013-12-26 MED ORDER — FLUCONAZOLE 150 MG PO TABS: ORAL_TABLET | ORAL | Status: DC

## 2013-12-26 NOTE — Patient Instructions (Addendum)
F/u as before  You will have HIV, RPR and HSV 2 blood tests  Self collect swabs for wet prep and culture again today  Fluconazole sent in for 2 days as symptoms sound like yeast infection

## 2013-12-27 LAB — HSV 2 ANTIBODY, IGG

## 2013-12-27 LAB — HIV ANTIBODY (ROUTINE TESTING W REFLEX): HIV 1&2 Ab, 4th Generation: NONREACTIVE

## 2013-12-27 LAB — RPR

## 2013-12-27 NOTE — Assessment & Plan Note (Signed)
Sub optimal control DASH diet and commitment to daily physical activity for a minimum of 30 minutes discussed and encouraged, as a part of hypertension management. The importance of attaining a healthy weight is also discussed. No med change at thsi time

## 2013-12-27 NOTE — Progress Notes (Signed)
   Subjective:    Patient ID: Sarah Coleman, female    DOB: 07-14-1951, 63 y.o.   MRN: [id number]  HPI 5 day h/o increased thick cream vaginal d/c with burning which is relieved with replens, which she states she uses regularly for vaginal dryness. Denies intercourse since recent treatment for trichomonas. States she does not trust her spouse, and has stated this for some time, may consider resumption of intimacy, still uncertain what she plans to do as far as her marriage is concerned, will  use  condoms if intimate with her spouse   Review of Systems See HPI Denies recent fever or chills. Denies sinus pressure, nasal congestion, ear pain or sore throat. Denies chest congestion, productive cough or wheezing. Reports less distress and anxiety over recent dx        Objective:   Physical Exam BP 128/86  Pulse 94  Resp 18  Ht 5\' 6"  (1.676 m)  Wt 226 lb 1.3 oz (102.549 kg)  BMI 36.51 kg/m2  SpO2 97% Patient alert and oriented and in no cardiopulmonary distress.  HEENT: No facial asymmetry, EOMI, no sinus tenderness,  oropharynx pink and moist.  Neck supple no adenopathy.  Chest: Clear to auscultation bilaterally.  CVS: S1, S2 no murmurs, no S3.  ABD: Soft non tender.  Ext: No edema  Pelvic:not done, pt has no womb, she self collected vaginal specimens to be tested.        Assessment & Plan:  Vaginitis and vulvovaginitis Recently treated fro trich, with c/o vaginal itch and burning, has not been sexually active since treatment and spouse has been treated. Specimens are re submitted and pt also is being tested for HIV , HSV2 and RPR Based on symptom and recent use of antibiotic , fluconazole 32 tabs prescribed  HYPERTENSION Sub optimal control DASH diet and commitment to daily physical activity for a minimum of 30 minutes discussed and encouraged, as a part of hypertension management. The importance of attaining a healthy weight is also discussed. No med change at  thsi time

## 2013-12-27 NOTE — Assessment & Plan Note (Signed)
Recently treated fro trich, with c/o vaginal itch and burning, has not been sexually active since treatment and spouse has been treated. Specimens are re submitted and pt also is being tested for HIV , HSV2 and RPR Based on symptom and recent use of antibiotic , fluconazole 32 tabs prescribed

## 2014-01-06 DIAGNOSIS — R51 Headache: Secondary | ICD-10-CM | POA: Insufficient documentation

## 2014-01-06 DIAGNOSIS — Z0000 Encounter for general adult medical examination without abnormal findings: Secondary | ICD-10-CM | POA: Insufficient documentation

## 2014-01-06 DIAGNOSIS — R519 Headache, unspecified: Secondary | ICD-10-CM | POA: Insufficient documentation

## 2014-01-06 NOTE — Assessment & Plan Note (Signed)
Specimens sent due  To symptoms of increased malodorous discharge

## 2014-01-06 NOTE — Assessment & Plan Note (Signed)
Normal neurologic exam, due to uncontrolled allergies and neck spasm. Tylenol administered

## 2014-01-06 NOTE — Progress Notes (Signed)
   Subjective:    Patient ID: Sarah Coleman, female    DOB: [birth year], 63 y.o.   MRN: [id number]  HPI Pt in for annual physical exam. She has other health concerns which are also addressed at this visit. She has a 2 week h/o increased and uncontrolled allergy symptoms with headache,  Facial pressure, excessive sneeezing and watery eyes. No fever or chills, has been using maintenance medication regularly. C/o headache and upper neck pain and spasm x 3 days no focal weakness, numbness or blurred vision C/o excessive malodorous vaginal d/s for past several weeks , wants STD testing   Review of Systems See HPI As above    Objective:   Physical Exam Pleasant obese female, alert and oriented x 3, in no cardio-pulmonary distress. Afebrile. HEENT No facial trauma or asymetry.  Frontal and maxillary Sinuses are tender.  EOMI, PERTL, fundoscopic exam , no hemorhage or exudate. Bilateral conjunctival injection with excessive watery drainage. Nasal mucosa erythematous and edematous External ears normal, tympanic membranes clear. Oropharynx moist, no exudate, fair dentition. Neck: bilateral trapezius spasm, left more than right, no adenopathy,JVD or thyromegaly.No bruits.  Chest: Clear to ascultation bilaterally.No crackles or wheezes. Non tender to palpation  Breast: No asymetry,no masses. No nipple discharge or inversion. No axillary or supraclavicular adenopathy  Cardiovascular system; Heart sounds normal,  S1 and  S2 ,no S3.  No murmur, or thrill. Apical beat not displaced Peripheral pulses normal.  Abdomen: Soft, non tender, no organomegaly or masses. No bruits. Bowel sounds normal. No guarding, tenderness or rebound.  Rectal:  No mass. Guaiac negative stool.  GU: External genitalia normal. No lesions. Vaginal canal erythematous .No ulcers or warts in vaginal canal.Malodorous grey vaginal d/c Uterus absent, no adnexal masses, no  adnexal tenderness.  Musculoskeletal  exam: Full ROM of thoraco lumbar  spine, hips , shoulders and knees. No deformity ,swelling or crepitus noted. No muscle wasting or atrophy.   Neurologic: Cranial nerves 2 to 12 intact. Power, tone ,sensation and reflexes normal throughout. No disturbance in gait. No tremor.  Skin: Intact, no ulceration, erythema , scaling or rash noted. Pigmentation normal throughout  Psych; Normal mood and affect. Judgement and concentration normal        Assessment & Plan:  Annual physical exam Complete annual physical exam as documented. Healthy lifestyle discussed and encouraged. Cancer screening and immunization reviewed and updated as able  ALLERGIC RHINITIS Uncontrolled. Depo medrol in office and prednisone dose pack prescribed  Neck muscle spasm Uncontrolled without medication for as needed use. Necjk exercises discussed and encouraged for regular use and muscle relaxant prescribed for as needed use  Vaginitis and vulvovaginitis Specimens sent due  To symptoms of increased malodorous discharge  Headache(784.0) Normal neurologic exam, due to uncontrolled allergies and neck spasm. Tylenol administered

## 2014-01-06 NOTE — Assessment & Plan Note (Signed)
Uncontrolled without medication for as needed use. Necjk exercises discussed and encouraged for regular use and muscle relaxant prescribed for as needed use

## 2014-01-06 NOTE — Assessment & Plan Note (Signed)
Uncontrolled. Depo medrol in office and prednisone dose pack prescribed

## 2014-01-06 NOTE — Assessment & Plan Note (Signed)
Complete annual physical exam as documented. Healthy lifestyle discussed and encouraged. Cancer screening and immunization reviewed and updated as able

## 2014-02-22 ENCOUNTER — Other Ambulatory Visit: Payer: Self-pay | Admitting: Family Medicine

## 2014-03-26 ENCOUNTER — Encounter: Payer: Self-pay | Admitting: Family Medicine

## 2014-03-26 ENCOUNTER — Ambulatory Visit (INDEPENDENT_AMBULATORY_CARE_PROVIDER_SITE_OTHER): Payer: BC Managed Care – PPO | Admitting: Family Medicine

## 2014-03-26 VITALS — BP 120/82 | HR 85 | Temp 98.9°F | Resp 16 | Ht 66.0 in | Wt 218.0 lb

## 2014-03-26 DIAGNOSIS — R7309 Other abnormal glucose: Secondary | ICD-10-CM

## 2014-03-26 DIAGNOSIS — R935 Abnormal findings on diagnostic imaging of other abdominal regions, including retroperitoneum: Secondary | ICD-10-CM

## 2014-03-26 DIAGNOSIS — R1013 Epigastric pain: Secondary | ICD-10-CM

## 2014-03-26 DIAGNOSIS — J309 Allergic rhinitis, unspecified: Secondary | ICD-10-CM

## 2014-03-26 DIAGNOSIS — I10 Essential (primary) hypertension: Secondary | ICD-10-CM

## 2014-03-26 DIAGNOSIS — R7303 Prediabetes: Secondary | ICD-10-CM

## 2014-03-26 DIAGNOSIS — K3189 Other diseases of stomach and duodenum: Secondary | ICD-10-CM

## 2014-03-26 DIAGNOSIS — E785 Hyperlipidemia, unspecified: Secondary | ICD-10-CM

## 2014-03-26 DIAGNOSIS — Z23 Encounter for immunization: Secondary | ICD-10-CM | POA: Insufficient documentation

## 2014-03-26 DIAGNOSIS — R110 Nausea: Secondary | ICD-10-CM | POA: Insufficient documentation

## 2014-03-26 LAB — CBC WITH DIFFERENTIAL/PLATELET
BASOS ABS: 0 10*3/uL (ref 0.0–0.1)
Basophils Relative: 0 % (ref 0–1)
EOS PCT: 1 % (ref 0–5)
Eosinophils Absolute: 0.1 10*3/uL (ref 0.0–0.7)
HEMATOCRIT: 35.6 % — AB (ref 36.0–46.0)
HEMOGLOBIN: 12.2 g/dL (ref 12.0–15.0)
Lymphocytes Relative: 46 % (ref 12–46)
Lymphs Abs: 3.5 10*3/uL (ref 0.7–4.0)
MCH: 29.9 pg (ref 26.0–34.0)
MCHC: 34.3 g/dL (ref 30.0–36.0)
MCV: 87.3 fL (ref 78.0–100.0)
MONO ABS: 0.5 10*3/uL (ref 0.1–1.0)
MONOS PCT: 6 % (ref 3–12)
NEUTROS ABS: 3.5 10*3/uL (ref 1.7–7.7)
Neutrophils Relative %: 47 % (ref 43–77)
Platelets: 295 10*3/uL (ref 150–400)
RBC: 4.08 MIL/uL (ref 3.87–5.11)
RDW: 15.5 % (ref 11.5–15.5)
WBC: 7.5 10*3/uL (ref 4.0–10.5)

## 2014-03-26 MED ORDER — CETIRIZINE HCL 10 MG PO TBDP: 1.0000 | ORAL_TABLET | Freq: Every day | ORAL | Status: DC

## 2014-03-26 MED ORDER — LOVASTATIN 20 MG PO TABS: ORAL_TABLET | ORAL | Status: DC

## 2014-03-26 MED ORDER — MONTELUKAST SODIUM 10 MG PO TABS: 10.0000 mg | ORAL_TABLET | Freq: Every day | ORAL | Status: DC

## 2014-03-26 NOTE — Assessment & Plan Note (Signed)
Uncontrolled, add singulair daily

## 2014-03-26 NOTE — Patient Instructions (Signed)
F/u in 3 monhs, call if you need me before  Prevnar today  You are scheduled for a test of your gall bladder tomorrow, info available with front desk  New medcation for allergies singulair one daily  Continue flonase.  It is  vital to flush with saline every day  Use sudafed one daily if you have a lot of excessive pressure in your sinuses  Labs today CBC chem 7 , HBA1C, H pylori

## 2014-03-27 ENCOUNTER — Telehealth: Payer: Self-pay | Admitting: *Deleted

## 2014-03-27 ENCOUNTER — Ambulatory Visit (HOSPITAL_COMMUNITY)
Admission: RE | Admit: 2014-03-27 | Discharge: 2014-03-27 | Disposition: A | Discharge status: Home / Self Care | Payer: BC Managed Care – PPO | Source: Ambulatory Visit | Attending: Family Medicine | Admitting: Family Medicine

## 2014-03-27 DIAGNOSIS — R110 Nausea: Secondary | ICD-10-CM

## 2014-03-27 DIAGNOSIS — R112 Nausea with vomiting, unspecified: Secondary | ICD-10-CM | POA: Diagnosis present

## 2014-03-27 DIAGNOSIS — R932 Abnormal findings on diagnostic imaging of liver and biliary tract: Secondary | ICD-10-CM | POA: Diagnosis not present

## 2014-03-27 LAB — BASIC METABOLIC PANEL
BUN: 10 mg/dL (ref 6–23)
CO2: 29 mEq/L (ref 19–32)
CREATININE: 0.98 mg/dL (ref 0.50–1.10)
Calcium: 9.9 mg/dL (ref 8.4–10.5)
Chloride: 100 mEq/L (ref 96–112)
GLUCOSE: 100 mg/dL — AB (ref 70–99)
POTASSIUM: 3.2 meq/L — AB (ref 3.5–5.3)
Sodium: 141 mEq/L (ref 135–145)

## 2014-03-27 LAB — HEMOGLOBIN A1C
Hgb A1c MFr Bld: 6.1 % — ABNORMAL HIGH (ref <5.7)
Mean Plasma Glucose: 128 mg/dL — ABNORMAL HIGH (ref <117)

## 2014-03-27 LAB — H. PYLORI ANTIBODY, IGG: H Pylori IgG: 0.56 {ISR}

## 2014-03-27 MED ORDER — POTASSIUM CHLORIDE ER 10 MEQ PO TBCR: 10.0000 meq | EXTENDED_RELEASE_TABLET | Freq: Two times a day (BID) | ORAL | Status: DC

## 2014-03-27 NOTE — Telephone Encounter (Signed)
Called and left message for patient notifying of what singulair is prescribed for

## 2014-03-27 NOTE — Telephone Encounter (Signed)
Pt called wanting to know what montelukasp is for. Please advise [phone]

## 2014-03-27 NOTE — Progress Notes (Signed)
Patient aware and med sent to pharmacy.  

## 2014-03-31 ENCOUNTER — Ambulatory Visit: Payer: BC Managed Care – PPO | Admitting: Family Medicine

## 2014-03-31 ENCOUNTER — Other Ambulatory Visit: Payer: Self-pay | Admitting: Family Medicine

## 2014-03-31 DIAGNOSIS — R935 Abnormal findings on diagnostic imaging of other abdominal regions, including retroperitoneum: Secondary | ICD-10-CM | POA: Insufficient documentation

## 2014-03-31 MED ORDER — LORAZEPAM 1 MG PO TABS: ORAL_TABLET | ORAL | Status: DC

## 2014-03-31 NOTE — Assessment & Plan Note (Signed)
Vaccine administered.

## 2014-03-31 NOTE — Assessment & Plan Note (Signed)
Controlled, no change in medication DASH diet and commitment to daily physical activity for a minimum of 30 minutes discussed and encouraged, as a part of hypertension management. The importance of attaining a healthy weight is also discussed.  

## 2014-03-31 NOTE — Assessment & Plan Note (Signed)
Deteriorated. HBA1C is increased Patient educated about the importance of limiting  Carbohydrate intake , the need to commit to daily physical activity for a minimum of 30 minutes , and to commit weight loss. The fact that changes in all these areas will reduce or eliminate all together the development of diabetes is stressed.

## 2014-03-31 NOTE — Assessment & Plan Note (Signed)
Increased and uncontrolled.H pylori test and evaluation of gallbladder asap

## 2014-03-31 NOTE — Assessment & Plan Note (Addendum)
Possible liver mass noted needing f/u dedicated MRI of  will order same, pt to be notified of abnormality in am Needs MRI hepatic profile , per recommendation of radiologist , need to order specifically as stated

## 2014-03-31 NOTE — Progress Notes (Signed)
   Subjective:    Patient ID: Sarah Coleman, female    DOB: 1950-10-22, 63 y.o.   MRN: [id number]  HPI 3 week h/o increased sinus drainage which is clear , also pressure, no fever or chills, no productive cough 1 week h/o increased abdominal pain with nausea, distension and excessive belching. No change in BM, no hematemesis , melena or hematochezia  Denies uncontrolled depression and reports improvement in anxiety and insomnia    Review of Systems See HPI  Denies chest pains, palpitations and leg swelling Denies dysuria, frequency, hesitancy or incontinence. Denies joint pain, swelling and limitation in mobility. Denies headaches, seizures, numbness, or tingling. Denies skin break down or rash.        Objective:   Physical Exam BP 120/82  Pulse 85  Temp(Src) 98.9 F (37.2 C) (Oral)  Resp 16  Ht 5\' 6"  (1.676 m)  Wt 218 lb (98.884 kg)  BMI 35.20 kg/m2  SpO2 95% Patient alert and oriented and in no cardiopulmonary distress.Anxious  HEENT: No facial asymmetry, EOMI,   oropharynx pink and moist.  Neck supple no JVD, no mass. No sinus tenderness, nasal mucosa erythematous and edematous Chest: Clear to auscultation bilaterally.  CVS: S1, S2 no murmurs, no S3.Regular rate.  ABD: Soft RUQ and epigastric tenderness and rebound, no palpable organomegaly or mass,  BS normal Ext: No edema  MS: Adequate ROM spine, shoulders, hips and knees.  Skin: Intact, no ulcerations or rash noted.  Psych: Good eye contact, normal affect. Memory intact  anxious not  depressed appearing.  CNS: CN 2-12 intact, power,  normal throughout.no focal deficits noted.        Assessment & Plan:  ALLERGIC RHINITIS Uncontrolled, add singulair daily  Nausea alone Increased and uncontrolled.H pylori test and evaluation of gallbladder asap   Abnormal [place] (ultrasound) of abdomen Possible liver mass noted needing f/u dedicated MRI of  will order same, pt to be notified of abnormality in  am Needs MRI hepatic profile , per recommendation of radiologist , need to order specifically as stated  Need for vaccination with 13-polyvalent pneumococcal conjugate vaccine Vaccine administered  HYPERTENSION Controlled, no change in medication DASH diet and commitment to daily physical activity for a minimum of 30 minutes discussed and encouraged, as a part of hypertension management. The importance of attaining a healthy weight is also discussed.   Prediabetes Deteriorated. HBA1C is increased Patient educated about the importance of limiting  Carbohydrate intake , the need to commit to daily physical activity for a minimum of 30 minutes , and to commit weight loss. The fact that changes in all these areas will reduce or eliminate all together the development of diabetes is stressed.

## 2014-04-02 NOTE — Addendum Note (Signed)
Addended by: Denman George B on: 04/02/2014 08:46 AM   Modules accepted: Orders

## 2014-04-08 ENCOUNTER — Encounter (HOSPITAL_COMMUNITY): Payer: Self-pay

## 2014-04-08 ENCOUNTER — Ambulatory Visit (HOSPITAL_COMMUNITY)
Admission: RE | Admit: 2014-04-08 | Discharge: 2014-04-08 | Disposition: A | Discharge status: Home / Self Care | Payer: BC Managed Care – PPO | Source: Ambulatory Visit | Attending: Family Medicine | Admitting: Family Medicine

## 2014-04-08 DIAGNOSIS — R935 Abnormal findings on diagnostic imaging of other abdominal regions, including retroperitoneum: Secondary | ICD-10-CM

## 2014-04-08 DIAGNOSIS — K769 Liver disease, unspecified: Secondary | ICD-10-CM | POA: Insufficient documentation

## 2014-04-08 MED ORDER — SODIUM CHLORIDE 0.9 % IJ SOLN
INTRAMUSCULAR | Status: AC
  Filled 2014-04-08: qty 15

## 2014-04-08 MED ORDER — GADOBENATE DIMEGLUMINE 529 MG/ML IV SOLN
20.0000 mL | Freq: Once | INTRAVENOUS | Status: AC | PRN
  Administered 2014-04-08: 20 mL via INTRAVENOUS

## 2014-04-08 MED ORDER — SODIUM CHLORIDE 0.9 % IV SOLN
INTRAVENOUS | Status: AC
  Filled 2014-04-08: qty 150

## 2014-04-24 ENCOUNTER — Telehealth: Payer: Self-pay | Admitting: *Deleted

## 2014-04-24 NOTE — Telephone Encounter (Signed)
Pt called stating she has a UTI, pt said she has had it for a week. Please advise [phone]

## 2014-04-24 NOTE — Telephone Encounter (Signed)
Patient states that she has burning with urination.   Urine also has odor and she has frequency.  Will come in for nurse visit 9/11 am.

## 2014-04-24 NOTE — Telephone Encounter (Signed)
Called and left message for patient to return call.  

## 2014-04-25 ENCOUNTER — Ambulatory Visit (INDEPENDENT_AMBULATORY_CARE_PROVIDER_SITE_OTHER): Payer: BC Managed Care – PPO

## 2014-04-25 ENCOUNTER — Encounter (INDEPENDENT_AMBULATORY_CARE_PROVIDER_SITE_OTHER): Payer: Self-pay

## 2014-04-25 DIAGNOSIS — N3000 Acute cystitis without hematuria: Secondary | ICD-10-CM

## 2014-04-25 LAB — POCT URINALYSIS DIPSTICK
Bilirubin, UA: NEGATIVE
Blood, UA: NEGATIVE
Glucose, UA: NEGATIVE
Ketones, UA: NEGATIVE
NITRITE UA: NEGATIVE
PROTEIN UA: NEGATIVE
Spec Grav, UA: 1.02
UROBILINOGEN UA: 0.2
pH, UA: 7

## 2014-04-25 MED ORDER — CIPROFLOXACIN HCL 500 MG PO TABS: 500.0000 mg | ORAL_TABLET | Freq: Two times a day (BID) | ORAL | Status: DC

## 2014-04-25 NOTE — Progress Notes (Signed)
Patient complaining of frequency and odor to urine.   In for poct urine.  Urine sent for culture.  Patient given script for Cipro 500mg  BID #6

## 2014-04-25 NOTE — Telephone Encounter (Signed)
Pt has been in today for UA, nursing staff also reports making her aware of message re MRI

## 2014-04-25 NOTE — Telephone Encounter (Signed)
pls let her know when she comes that I reviewed her MRI report with Dr Oneida Alar, she does not need any follow up on the scan of her  Liver If uA is abnormal, pls send cipro 500mg  #6 only per protocol

## 2014-04-27 ENCOUNTER — Other Ambulatory Visit: Payer: Self-pay | Admitting: Family Medicine

## 2014-04-27 LAB — URINE CULTURE

## 2014-05-12 ENCOUNTER — Telehealth: Payer: Self-pay

## 2014-05-12 DIAGNOSIS — R8290 Unspecified abnormal findings in urine: Secondary | ICD-10-CM

## 2014-05-12 NOTE — Telephone Encounter (Signed)
Noted, thank you, I will sign off

## 2014-05-27 ENCOUNTER — Other Ambulatory Visit: Payer: Self-pay | Admitting: Family Medicine

## 2014-05-27 DIAGNOSIS — Z1231 Encounter for screening mammogram for malignant neoplasm of breast: Secondary | ICD-10-CM

## 2014-06-09 ENCOUNTER — Ambulatory Visit (HOSPITAL_COMMUNITY)
Admission: RE | Admit: 2014-06-09 | Discharge: 2014-06-09 | Disposition: A | Discharge status: Home / Self Care | Payer: BC Managed Care – PPO | Source: Ambulatory Visit | Attending: Family Medicine | Admitting: Family Medicine

## 2014-06-09 DIAGNOSIS — Z1231 Encounter for screening mammogram for malignant neoplasm of breast: Secondary | ICD-10-CM | POA: Diagnosis present

## 2014-06-21 ENCOUNTER — Other Ambulatory Visit: Payer: Self-pay | Admitting: Family Medicine

## 2014-07-07 ENCOUNTER — Other Ambulatory Visit: Payer: Self-pay | Admitting: Family Medicine

## 2014-07-23 ENCOUNTER — Other Ambulatory Visit: Payer: Self-pay | Admitting: Family Medicine

## 2014-07-23 ENCOUNTER — Telehealth: Payer: Self-pay

## 2014-07-23 DIAGNOSIS — E785 Hyperlipidemia, unspecified: Secondary | ICD-10-CM

## 2014-07-23 DIAGNOSIS — R7303 Prediabetes: Secondary | ICD-10-CM

## 2014-07-23 LAB — LIPID PANEL
CHOL/HDL RATIO: 4.1 ratio
Cholesterol: 178 mg/dL (ref 0–200)
HDL: 43 mg/dL (ref >39)
LDL CALC: 78 mg/dL (ref 0–99)
Triglycerides: 285 mg/dL — ABNORMAL HIGH (ref <150)
VLDL: 57 mg/dL — ABNORMAL HIGH (ref 0–40)

## 2014-07-23 NOTE — Telephone Encounter (Signed)
Lab orders entered

## 2014-07-24 LAB — HEMOGLOBIN A1C
HEMOGLOBIN A1C: 6 % — AB (ref <5.7)
MEAN PLASMA GLUCOSE: 126 mg/dL — AB (ref <117)

## 2014-07-25 LAB — BASIC METABOLIC PANEL
BUN: 10 mg/dL (ref 6–23)
CO2: 29 mEq/L (ref 19–32)
CREATININE: 0.94 mg/dL (ref 0.50–1.10)
Calcium: 9.3 mg/dL (ref 8.4–10.5)
Chloride: 97 mEq/L (ref 96–112)
Glucose, Bld: 110 mg/dL — ABNORMAL HIGH (ref 70–99)
Potassium: 3.6 mEq/L (ref 3.5–5.3)
Sodium: 136 mEq/L (ref 135–145)

## 2014-07-28 ENCOUNTER — Ambulatory Visit (INDEPENDENT_AMBULATORY_CARE_PROVIDER_SITE_OTHER): Payer: BC Managed Care – PPO | Admitting: Family Medicine

## 2014-07-28 ENCOUNTER — Ambulatory Visit (INDEPENDENT_AMBULATORY_CARE_PROVIDER_SITE_OTHER): Payer: BC Managed Care – PPO

## 2014-07-28 ENCOUNTER — Encounter: Payer: Self-pay | Admitting: Family Medicine

## 2014-07-28 VITALS — BP 120/82 | HR 73 | Resp 16 | Ht 66.0 in | Wt 220.0 lb

## 2014-07-28 DIAGNOSIS — E785 Hyperlipidemia, unspecified: Secondary | ICD-10-CM

## 2014-07-28 DIAGNOSIS — I10 Essential (primary) hypertension: Secondary | ICD-10-CM

## 2014-07-28 DIAGNOSIS — R7303 Prediabetes: Secondary | ICD-10-CM

## 2014-07-28 DIAGNOSIS — IMO0001 Reserved for inherently not codable concepts without codable children: Secondary | ICD-10-CM

## 2014-07-28 DIAGNOSIS — R7309 Other abnormal glucose: Secondary | ICD-10-CM

## 2014-07-28 DIAGNOSIS — Z23 Encounter for immunization: Secondary | ICD-10-CM

## 2014-07-28 DIAGNOSIS — K219 Gastro-esophageal reflux disease without esophagitis: Secondary | ICD-10-CM

## 2014-07-28 DIAGNOSIS — J302 Other seasonal allergic rhinitis: Secondary | ICD-10-CM

## 2014-07-28 NOTE — Assessment & Plan Note (Signed)
Adequate control, pt electively d/c ,meds prescribed due t lack of need

## 2014-07-28 NOTE — Assessment & Plan Note (Signed)
No significant improveme nt with metformin, and pt feels that rash due to metformin d/c med Patient educated about the importance of limiting  Carbohydrate intake , the need to commit to daily physical activity for a minimum of 30 minutes , and to commit weight loss. The fact that changes in all these areas will reduce or eliminate all together the development of diabetes is stressed.   Updated lab needed at/ before next visit.

## 2014-07-28 NOTE — Assessment & Plan Note (Signed)
Deteriorated. Patient re-educated about  the importance of commitment to a  minimum of 150 minutes of exercise per week. The importance of healthy food choices with portion control discussed. Encouraged to start a food diary, count calories and to consider  joining a support group. Sample diet sheets offered. Goals set by the patient for the next several months.    

## 2014-07-28 NOTE — Assessment & Plan Note (Signed)
elevated TG, need to reduce cheese, no med change

## 2014-07-28 NOTE — Assessment & Plan Note (Signed)
Controlled, no change in medication  

## 2014-07-28 NOTE — Assessment & Plan Note (Signed)
Controlled, no change in medication DASH diet and commitment to daily physical activity for a minimum of 30 minutes discussed and encouraged, as a part of hypertension management. The importance of attaining a healthy weight is also discussed.  

## 2014-07-28 NOTE — Progress Notes (Signed)
   Subjective:    Patient ID: Sarah Coleman, female    DOB: December 11, 1950, 63 y.o.   MRN: [id number]  HPI The PT is here for follow up and re-evaluation of chronic medical conditions, medication management and review of any available recent lab and radiology data.  Preventive health is updated, specifically  Cancer screening and Immunization.   Questions or concerns regarding consultations or procedures which the PT has had in the interim are  Addressed.Using topical estrogen low dose twice weekly, improved The PT attributed de pigmentation to metformin affects hands and distal forearms, has vitiligo in her family, since prediabetic will d/c med , but i doubt this is the culprit C/o inability to maintain healthy lifestyle with weight gain, plans to change this    Review of Systems    See HPI Denies recent fever or chills. Denies uncontrolled  sinus pressure, nasal congestion, ear pain or sore throat. Denies chest congestion, productive cough or wheezing. Denies chest pains, palpitations and leg swelling Denies abdominal pain, nausea, vomiting,diarrhea or constipation.   Denies dysuria, frequency, hesitancy or incontinence. Denies joint pain, swelling and limitation in mobility. Denies headaches, seizures, numbness, or tingling. Denies depression, anxiety or insomnia.    Objective:   Physical Exam  BP 120/82 mmHg  Pulse 73  Resp 16  Ht 5\' 6"  (1.676 m)  Wt 220 lb (99.791 kg)  BMI 35.53 kg/m2  SpO2 98% Patient alert and oriented and in no cardiopulmonary distress.  HEENT: No facial asymmetry, EOMI,   oropharynx pink and moist.  Neck supple no JVD, no mass.  Chest: Clear to auscultation bilaterally.  CVS: S1, S2 no murmurs, no S3.Regular rate.  ABD: Soft non tender.   Ext: No edema  MS: Adequate ROM spine, shoulders, hips and knees.  Skin: Intact, hypopigmented annular lesions on habd, dorsum areas Psych: Good eye contact, normal affect. Memory intact not anxious or  depressed appearing.  CNS: CN 2-12 intact, power,  normal throughout.no focal deficits noted.       Assessment & Plan:  Essential hypertension Controlled, no change in medication DASH diet and commitment to daily physical activity for a minimum of 30 minutes discussed and encouraged, as a part of hypertension management. The importance of attaining a healthy weight is also discussed.   Obesity, Class II, BMI 35.0-39.9, with comorbidity (see actual BMI) Deteriorated. Patient re-educated about  the importance of commitment to a  minimum of 150 minutes of exercise per week. The importance of healthy food choices with portion control discussed. Encouraged to start a food diary, count calories and to consider  joining a support group. Sample diet sheets offered. Goals set by the patient for the next several months.     Hyperlipemia elevated TG, need to reduce cheese, no med change  Need for prophylactic vaccination and inoculation against influenza Vaccine administered at visit.   Seasonal allergies Adequate control, pt electively d/c ,meds prescribed due t lack of need  GERD (gastroesophageal reflux disease) Controlled, no change in medication   Prediabetes No significant improveme nt with metformin, and pt feels that rash due to metformin d/c med Patient educated about the importance of limiting  Carbohydrate intake , the need to commit to daily physical activity for a minimum of 30 minutes , and to commit weight loss. The fact that changes in all these areas will reduce or eliminate all together the development of diabetes is stressed.   Updated lab needed at/ before next visit.

## 2014-07-28 NOTE — Patient Instructions (Addendum)
Welcome to medicare in end April, call if you need me before  Flu vaccine today  STOP metformin  Reduce cheese, triglyceride is high  Fasting lipid, cmp and EGFR, HBA1C and TSH for April visit

## 2014-07-28 NOTE — Assessment & Plan Note (Signed)
Vaccine administered at visit.  

## 2014-08-05 ENCOUNTER — Other Ambulatory Visit: Payer: Self-pay | Admitting: Family Medicine

## 2014-09-10 ENCOUNTER — Other Ambulatory Visit: Payer: Self-pay | Admitting: Family Medicine

## 2014-09-23 ENCOUNTER — Other Ambulatory Visit: Payer: Self-pay | Admitting: Family Medicine

## 2014-10-09 ENCOUNTER — Other Ambulatory Visit: Payer: Self-pay | Admitting: Family Medicine

## 2014-10-13 ENCOUNTER — Other Ambulatory Visit: Payer: Self-pay | Admitting: Family Medicine

## 2014-10-31 ENCOUNTER — Other Ambulatory Visit: Payer: Self-pay | Admitting: Family Medicine

## 2014-12-17 LAB — COMPLETE METABOLIC PANEL WITH GFR
ALT: 20 U/L (ref 0–35)
AST: 20 U/L (ref 0–37)
Albumin: 4.5 g/dL (ref 3.5–5.2)
Alkaline Phosphatase: 67 U/L (ref 39–117)
BUN: 11 mg/dL (ref 6–23)
CALCIUM: 9.6 mg/dL (ref 8.4–10.5)
CO2: 29 meq/L (ref 19–32)
CREATININE: 0.98 mg/dL (ref 0.50–1.10)
Chloride: 103 mEq/L (ref 96–112)
GFR, EST AFRICAN AMERICAN: 70 mL/min
GFR, Est Non African American: 61 mL/min
Glucose, Bld: 101 mg/dL — ABNORMAL HIGH (ref 70–99)
Potassium: 3.6 mEq/L (ref 3.5–5.3)
Sodium: 140 mEq/L (ref 135–145)
Total Bilirubin: 0.6 mg/dL (ref 0.2–1.2)
Total Protein: 7.3 g/dL (ref 6.0–8.3)

## 2014-12-17 LAB — LIPID PANEL
CHOLESTEROL: 182 mg/dL (ref 0–200)
HDL: 37 mg/dL — ABNORMAL LOW (ref >=46)
LDL Cholesterol: 73 mg/dL (ref 0–99)
Total CHOL/HDL Ratio: 4.9 Ratio
Triglycerides: 360 mg/dL — ABNORMAL HIGH (ref <150)
VLDL: 72 mg/dL — ABNORMAL HIGH (ref 0–40)

## 2014-12-17 LAB — TSH: TSH: 0.791 u[IU]/mL (ref 0.350–4.500)

## 2014-12-17 LAB — HEMOGLOBIN A1C
Hgb A1c MFr Bld: 6.1 % — ABNORMAL HIGH (ref <5.7)
MEAN PLASMA GLUCOSE: 128 mg/dL — AB (ref <117)

## 2014-12-22 ENCOUNTER — Ambulatory Visit (INDEPENDENT_AMBULATORY_CARE_PROVIDER_SITE_OTHER): Payer: PPO | Admitting: Family Medicine

## 2014-12-22 ENCOUNTER — Encounter: Payer: Self-pay | Admitting: Family Medicine

## 2014-12-22 VITALS — BP 122/84 | HR 84 | Resp 16 | Ht 65.25 in | Wt 222.0 lb

## 2014-12-22 DIAGNOSIS — Z0000 Encounter for general adult medical examination without abnormal findings: Secondary | ICD-10-CM | POA: Diagnosis not present

## 2014-12-22 DIAGNOSIS — J302 Other seasonal allergic rhinitis: Secondary | ICD-10-CM | POA: Diagnosis not present

## 2014-12-22 DIAGNOSIS — H8123 Vestibular neuronitis, bilateral: Secondary | ICD-10-CM

## 2014-12-22 DIAGNOSIS — R7303 Prediabetes: Secondary | ICD-10-CM

## 2014-12-22 DIAGNOSIS — E785 Hyperlipidemia, unspecified: Secondary | ICD-10-CM

## 2014-12-22 DIAGNOSIS — R7309 Other abnormal glucose: Secondary | ICD-10-CM

## 2014-12-22 DIAGNOSIS — R9431 Abnormal electrocardiogram [ECG] [EKG]: Secondary | ICD-10-CM

## 2014-12-22 DIAGNOSIS — I10 Essential (primary) hypertension: Secondary | ICD-10-CM

## 2014-12-22 DIAGNOSIS — R935 Abnormal findings on diagnostic imaging of other abdominal regions, including retroperitoneum: Secondary | ICD-10-CM

## 2014-12-22 DIAGNOSIS — Z13820 Encounter for screening for osteoporosis: Secondary | ICD-10-CM

## 2014-12-22 DIAGNOSIS — N760 Acute vaginitis: Secondary | ICD-10-CM

## 2014-12-22 LAB — POCT URINALYSIS DIPSTICK
Bilirubin, UA: NEGATIVE
Blood, UA: NEGATIVE
GLUCOSE UA: NEGATIVE
KETONES UA: NEGATIVE
Leukocytes, UA: NEGATIVE
Nitrite, UA: NEGATIVE
Protein, UA: NEGATIVE
SPEC GRAV UA: 1.015
Urobilinogen, UA: 0.2
pH, UA: 7

## 2014-12-22 MED ORDER — LOVASTATIN 20 MG PO TABS: 20.0000 mg | ORAL_TABLET | Freq: Every day | ORAL | Status: DC

## 2014-12-22 MED ORDER — PREDNISONE 5 MG PO TABS: 5.0000 mg | ORAL_TABLET | Freq: Two times a day (BID) | ORAL | Status: AC

## 2014-12-22 MED ORDER — FLUCONAZOLE 150 MG PO TABS: ORAL_TABLET | ORAL | Status: DC

## 2014-12-22 MED ORDER — CYCLOBENZAPRINE HCL 10 MG PO TABS: 10.0000 mg | ORAL_TABLET | Freq: Every day | ORAL | Status: DC

## 2014-12-22 MED ORDER — METHYLPREDNISOLONE ACETATE 80 MG/ML IJ SUSP
80.0000 mg | Freq: Once | INTRAMUSCULAR | Status: AC
  Administered 2014-12-22: 80 mg via INTRAMUSCULAR

## 2014-12-22 MED ORDER — AZELASTINE HCL 0.1 % NA SOLN: 2.0000 | Freq: Two times a day (BID) | NASAL | Status: DC

## 2014-12-22 MED ORDER — POTASSIUM CHLORIDE ER 10 MEQ PO TBCR: 10.0000 meq | EXTENDED_RELEASE_TABLET | Freq: Two times a day (BID) | ORAL | Status: DC

## 2014-12-22 MED ORDER — CETIRIZINE HCL 10 MG PO TABS: 10.0000 mg | ORAL_TABLET | Freq: Every day | ORAL | Status: DC

## 2014-12-22 MED ORDER — PANTOPRAZOLE SODIUM 20 MG PO TBEC: 20.0000 mg | DELAYED_RELEASE_TABLET | Freq: Every day | ORAL | Status: DC

## 2014-12-22 MED ORDER — MECLIZINE HCL 12.5 MG PO TABS: 12.5000 mg | ORAL_TABLET | Freq: Three times a day (TID) | ORAL | Status: DC | PRN

## 2014-12-22 MED ORDER — TRIAMTERENE-HCTZ 75-50 MG PO TABS: 1.0000 | ORAL_TABLET | Freq: Every day | ORAL | Status: DC

## 2014-12-22 NOTE — Assessment & Plan Note (Signed)
Currently symptomatic and requests fluconazole , will send 1 tab, she will need testing if not cleared with this

## 2014-12-22 NOTE — Assessment & Plan Note (Signed)
Uncontrolled, depo medrol in office followed by short course of prednisone, add astellin

## 2014-12-22 NOTE — Assessment & Plan Note (Addendum)
Current flare, will send in antivert

## 2014-12-22 NOTE — Progress Notes (Signed)
Subjective:    Patient ID: Sarah Coleman, female    DOB: 1950-11-07, 64 y.o.   MRN: [id number]  HPI  Preventive Screening-Counseling & Management   Patient present here today for a Medicare annual wellness visit.   Current Problems (verified)   Medications Prior to Visit Allergies (verified)   PAST HISTORY  Family History (verified)    Social History Married for 11 years, 5 children, currently a homemaker    Risk Factors  Current exercise habits: walks around 2 days a week for 30 minutes    Dietary issues discussed: Heart healthy diet with lots of fruits and vegetables and limits red meat and carbs    Cardiac risk factors: mother massive MI in her 56's   Depression Screen  (Note: if answer to either of the following is "Yes", a more complete depression screening is indicated)   Over the past two weeks, have you felt down, depressed or hopeless? No  Over the past two weeks, have you felt little interest or pleasure in doing things? No  Have you lost interest or pleasure in daily life? No  Do you often feel hopeless? No  Do you cry easily over simple problems? No, recently lost a close family member   Activities of Daily Living  In your present state of health, do you have any difficulty performing the following activities?  Driving?: No Managing money?: No Feeding yourself?:No Getting from bed to chair?:No Climbing a flight of stairs?:No Preparing food and eating?:No Bathing or showering?:No Getting dressed?:No Getting to the toilet?:No Using the toilet?:No Moving around from place to place?: No  Fall Risk Assessment In the past year have you fallen or had a near fall?:No Are you currently taking any medications that make you dizzy?:No   Hearing Difficulties: No Do you often ask people to speak up or repeat themselves?:No Do you experience ringing or noises in your ears?:No Do you have difficulty understanding soft or whispered voices?:No  Cognitive  Testing  Alert? Yes Normal Appearance?Yes  Oriented to person? Yes Place? Yes  Time? Yes  Displays appropriate judgment?Yes  Can read the correct time from a watch face? yes Are you having problems remembering things?No  Advanced Directives have been discussed with the patient?Yes, no living will. Will give brochure  , full code   List the Names of Other Physician/Practitioners you currently use:     Indicate any recent Medical Services you may have received from other than [facility] providers in the past year (date may be approximate).   Assessment:    Annual Wellness Exam   Plan:    Patient Instructions (the written plan) was given to the patient.  Medicare Attestation  I have personally reviewed:  The patient's medical and social history  Their use of alcohol, tobacco or illicit drugs  Their current medications and supplements  The patient's functional ability including ADLs,fall risks, home safety risks, cognitive, and hearing and visual impairment  Diet and physical activities  Evidence for depression or mood disorders  The patient's weight, height, BMI, and visual acuity have been recorded in the chart. I have made referrals, counseling, and provided education to the patient based on review of the above and I have provided the patient with a written personalized care plan for preventive services.     Review of Systems     Objective:   Physical Exam BP 122/84 mmHg  Pulse 84  Resp 16  Ht 5' 5.25" (1.657 m)  Wt 222 lb (100.699  kg)  BMI 36.68 kg/m2  SpO2 98%        Assessment & Plan:  Welcome to Medicare preventive visit Annual exam as documented. Counseling done  re healthy lifestyle involving commitment to 150 minutes exercise per week, heart healthy diet, and attaining healthy weight.The importance of adequate sleep also discussed. Regular seat belt use and home safety, is also discussed. Changes in health habits are decided on by the patient with goals and  time frames  set for achieving them. Immunization and cancer screening needs are specifically addressed at this visit. GHome safety and end of life issues also discussed    Episodic recurrent vertigo Current flare, will send in antivert   Vaginitis and vulvovaginitis Currently symptomatic and requests fluconazole , will send 1 tab, she will need testing if not cleared with this   Seasonal allergies Uncontrolled, depo medrol in office followed by short course of prednisone, add astellin    Nonspecific abnormal electrocardiogram (ECG) (EKG) Possible old anterior infarction, pt has metabolic syndrome and positive f/h of CAD, mother died in her 41's of MI, refer to cardiology for further evaluation

## 2014-12-22 NOTE — Assessment & Plan Note (Signed)
Annual exam as documented. Counseling done  re healthy lifestyle involving commitment to 150 minutes exercise per week, heart healthy diet, and attaining healthy weight.The importance of adequate sleep also discussed. Regular seat belt use and home safety, is also discussed. Changes in health habits are decided on by the patient with goals and time frames  set for achieving them. Immunization and cancer screening needs are specifically addressed at this visit. GHome safety and end of life issues also discussed

## 2014-12-22 NOTE — Patient Instructions (Signed)
Annual physical exam mid September call if you need me before  EKG today is abnormal you are referred to cardiology for further evaluation  You are referred for bone density test  Please change eating habits and work on weight loss to improve your health, blood sugar and triglycerides are too high  Injection in office today for uncontrolled allergies and 2 medications sent in also medication for vertigo  Fluconazole sent for vaginal itch  Keep a positive attitude, call for help if needed.  Thanks for choosing [facility], we consider it a privelige to serve you.  CBC, fasting lipid, cmp and EGFr, hBA1C for Sept visit

## 2014-12-23 NOTE — Assessment & Plan Note (Signed)
Possible old anterior infarction, pt has metabolic syndrome and positive f/h of CAD, mother died in her 87's of MI, refer to cardiology for further evaluation

## 2014-12-30 ENCOUNTER — Telehealth: Payer: Self-pay | Admitting: *Deleted

## 2014-12-30 DIAGNOSIS — N760 Acute vaginitis: Secondary | ICD-10-CM

## 2014-12-30 MED ORDER — FLUCONAZOLE 150 MG PO TABS: ORAL_TABLET | ORAL | Status: DC

## 2014-12-30 NOTE — Telephone Encounter (Signed)
Pt called [facility] stating she needs to take different pills for her yeast infection. Please advise

## 2014-12-30 NOTE — Telephone Encounter (Signed)
Med sent.

## 2014-12-31 ENCOUNTER — Other Ambulatory Visit (HOSPITAL_COMMUNITY): Payer: PPO

## 2015-01-08 ENCOUNTER — Ambulatory Visit (INDEPENDENT_AMBULATORY_CARE_PROVIDER_SITE_OTHER): Payer: PPO | Admitting: Cardiology

## 2015-01-08 ENCOUNTER — Encounter: Payer: Self-pay | Admitting: Cardiology

## 2015-01-08 VITALS — BP 116/81 | HR 80 | Ht 66.0 in | Wt 222.8 lb

## 2015-01-08 DIAGNOSIS — R9431 Abnormal electrocardiogram [ECG] [EKG]: Secondary | ICD-10-CM | POA: Diagnosis not present

## 2015-01-08 NOTE — Progress Notes (Signed)
Clinical Summary Sarah Coleman is a 64 y.o.female seen today as a new patient for the following medical problems.   1. Abnormal EKG - no history of heart troubles - denies any chest pain, denies any SOB or DOE. Fairly sedentary lifestyle - no LE edema  - several CAD risk factors: HL, HTN, borderline DM, no tobacco, mother MI in her 24s, half brother MI at age 23    Past Medical History  Diagnosis Date  . Allergic rhinitis   . Obesity   . Hypertension   . Hyperlipidemia   . Prediabetes 2010  . GERD (gastroesophageal reflux disease) 2013  . Morbid obesity 2000  . Metabolic syndrome X 9485  . Allergy      Allergies  Allergen Reactions  . Ibuprofen   . Metformin And Related Dermatitis    Depigmentation of hands and wrists in 4 first 4 months of use, resembles vitiligo  . Penicillins      Current Outpatient Prescriptions  Medication Sig Dispense Refill  . aspirin EC 81 MG tablet Take 1 tablet (81 mg total) by mouth daily. 150 tablet 2  . azelastine (ASTELIN) 0.1 % nasal spray Place 2 sprays into both nostrils 2 (two) times daily. Use in each nostril as directed 30 mL 12  . cetirizine (ZYRTEC) 10 MG tablet Take 1 tablet (10 mg total) by mouth daily. 90 tablet 1  . cyclobenzaprine (FLEXERIL) 10 MG tablet Take 1 tablet (10 mg total) by mouth at bedtime. 30 tablet 3  . estradiol (ESTRACE) 0.1 MG/GM vaginal cream Place 1 Applicatorful vaginally 3 (three) times a week.    . fluconazole (DIFLUCAN) 150 MG tablet One tablet one time only for vaginal itch from yeast infection 1 tablet 0  . fluticasone (FLONASE) 50 MCG/ACT nasal spray USE TWO SPRAY(S) IN EACH NOSTRIL ONCE DAILY 16 g 4  . lovastatin (MEVACOR) 20 MG tablet Take 1 tablet (20 mg total) by mouth at bedtime. 90 tablet 1  . meclizine (ANTIVERT) 12.5 MG tablet Take 1 tablet (12.5 mg total) by mouth 3 (three) times daily as needed for dizziness. 30 tablet 0  . pantoprazole (PROTONIX) 20 MG tablet Take 1 tablet (20 mg total)  by mouth daily. 90 tablet 1  . potassium chloride (K-DUR) 10 MEQ tablet Take 1 tablet (10 mEq total) by mouth 2 (two) times daily. 60 tablet 5  . triamterene-hydrochlorothiazide (MAXZIDE) 75-50 MG per tablet Take 1 tablet by mouth daily. 90 tablet 1   No current facility-administered medications for this visit.     Past Surgical History  Procedure Laterality Date  . Partial hysterectomy  1987    secondary to ovarian cyst   . Tubal ligation  1975     Allergies  Allergen Reactions  . Ibuprofen   . Metformin And Related Dermatitis    Depigmentation of hands and wrists in 4 first 4 months of use, resembles vitiligo  . Penicillins       Family History  Problem Relation Age of Onset  . Heart attack Mother   . Heart disease Mother 16    massive heart attack  . Prostate cancer Father   . Cancer Father     colon  . Heart disease Brother   . Hypertension Sister   . Ovarian cancer Sister   . Cancer Brother 72    oral   . Hypertension Brother      Social History Sarah Coleman reports that she has quit smoking. She quit smokeless  tobacco use about 40 years ago. Sarah Coleman reports that she does not drink alcohol.   Review of Systems CONSTITUTIONAL: No weight loss, fever, chills, weakness or fatigue.  HEENT: Eyes: No visual loss, blurred vision, double vision or yellow sclerae.No hearing loss, sneezing, congestion, runny nose or sore throat.  SKIN: No rash or itching.  CARDIOVASCULAR: per HPI RESPIRATORY: No shortness of breath, cough or sputum.  GASTROINTESTINAL: No anorexia, nausea, vomiting or diarrhea. No abdominal pain or blood.  GENITOURINARY: No burning on urination, no polyuria NEUROLOGICAL: No headache, dizziness, syncope, paralysis, ataxia, numbness or tingling in the extremities. No change in bowel or bladder control.  MUSCULOSKELETAL: No muscle, back pain, joint pain or stiffness.  LYMPHATICS: No enlarged nodes. No history of splenectomy.  PSYCHIATRIC: No history of  depression or anxiety.  ENDOCRINOLOGIC: No reports of sweating, cold or heat intolerance. No polyuria or polydipsia.  Marland Kitchen   Physical Examination Filed Vitals:   01/08/15 1333  BP: 116/81  Pulse: 80   Filed Vitals:   01/08/15 1333  Height: 5\' 6"  (1.676 m)  Weight: 222 lb 12.8 oz (101.061 kg)    Gen: resting comfortably, no acute distress HEENT: no scleral icterus, pupils equal round and reactive, no palptable cervical adenopathy,  CV: RRR, no m/r/g, no JVD Resp: Clear to auscultation bilaterally GI: abdomen is soft, non-tender, non-distended, normal bowel sounds, no hepatosplenomegaly MSK: extremities are warm, no edema.  Skin: warm, no rash Neuro:  no focal deficits Psych: appropriate affect   Diagnostic Studies EKG: SR, non-specific ST/T changes    Assessment and Plan  1. Abnormal EKG - non-specific ST/T changes on EKG, no specific symptoms. Patient with multiple CAD risk factors, will obtain echo. If normal would not pursue any further cardiac workup unless she develops symptoms   F/u pending echo results      [name], M.D.

## 2015-01-08 NOTE — Patient Instructions (Signed)
Your physician recommends that you schedule a follow-up appointment to be determined after testing   Your physician recommends that you continue on your current medications as directed. Please refer to the Current Medication list given to you today.  Your physician has requested that you have an echocardiogram. Echocardiography is a painless test that uses sound waves to create images of your heart. It provides your doctor with information about the size and shape of your heart and how well your heart's chambers and valves are working. This procedure takes approximately one hour. There are no restrictions for this procedure.  Thank you for choosing [facility]!!

## 2015-01-09 ENCOUNTER — Encounter: Payer: Self-pay | Admitting: Family Medicine

## 2015-01-14 ENCOUNTER — Ambulatory Visit (INDEPENDENT_AMBULATORY_CARE_PROVIDER_SITE_OTHER): Payer: PPO

## 2015-01-14 ENCOUNTER — Other Ambulatory Visit: Payer: Self-pay

## 2015-01-14 DIAGNOSIS — R9431 Abnormal electrocardiogram [ECG] [EKG]: Secondary | ICD-10-CM

## 2015-01-15 ENCOUNTER — Telehealth: Payer: Self-pay | Admitting: *Deleted

## 2015-01-15 NOTE — Telephone Encounter (Signed)
Pt made aware, recall placed for 1 year with Dr. Harl Bowie, routed to pcp

## 2015-01-15 NOTE — Telephone Encounter (Signed)
-----   Message from [name], MD sent at 01/14/2015  7:17 PM EDT ----- Echo is normal, her heart function looks good. Can f/u with me in 1 year, no indication for further testing unless she develops any new symptoms  Zandra Abts MD

## 2015-01-21 ENCOUNTER — Ambulatory Visit (HOSPITAL_COMMUNITY)
Admission: RE | Admit: 2015-01-21 | Discharge: 2015-01-21 | Disposition: A | Discharge status: Home / Self Care | Payer: PPO | Source: Ambulatory Visit | Attending: Family Medicine | Admitting: Family Medicine

## 2015-01-21 DIAGNOSIS — Z780 Asymptomatic menopausal state: Secondary | ICD-10-CM | POA: Diagnosis not present

## 2015-01-21 DIAGNOSIS — Z13820 Encounter for screening for osteoporosis: Secondary | ICD-10-CM | POA: Insufficient documentation

## 2015-04-23 ENCOUNTER — Other Ambulatory Visit: Payer: Self-pay | Admitting: Family Medicine

## 2015-05-14 ENCOUNTER — Ambulatory Visit (INDEPENDENT_AMBULATORY_CARE_PROVIDER_SITE_OTHER): Payer: PPO

## 2015-05-14 DIAGNOSIS — Z23 Encounter for immunization: Secondary | ICD-10-CM

## 2015-06-03 ENCOUNTER — Other Ambulatory Visit: Payer: Self-pay | Admitting: Family Medicine

## 2015-06-04 ENCOUNTER — Ambulatory Visit (INDEPENDENT_AMBULATORY_CARE_PROVIDER_SITE_OTHER): Payer: PPO | Admitting: Family Medicine

## 2015-06-04 ENCOUNTER — Encounter: Payer: Self-pay | Admitting: Family Medicine

## 2015-06-04 VITALS — BP 136/92 | HR 98 | Resp 18 | Ht 65.25 in | Wt 226.0 lb

## 2015-06-04 DIAGNOSIS — Z1211 Encounter for screening for malignant neoplasm of colon: Secondary | ICD-10-CM | POA: Diagnosis not present

## 2015-06-04 DIAGNOSIS — R7303 Prediabetes: Secondary | ICD-10-CM

## 2015-06-04 DIAGNOSIS — I10 Essential (primary) hypertension: Secondary | ICD-10-CM

## 2015-06-04 DIAGNOSIS — E785 Hyperlipidemia, unspecified: Secondary | ICD-10-CM

## 2015-06-04 DIAGNOSIS — Z0000 Encounter for general adult medical examination without abnormal findings: Secondary | ICD-10-CM

## 2015-06-04 LAB — CBC WITH DIFFERENTIAL/PLATELET
Basophils Absolute: 0 10*3/uL (ref 0.0–0.1)
Basophils Relative: 0 % (ref 0–1)
EOS PCT: 1 % (ref 0–5)
Eosinophils Absolute: 0.1 10*3/uL (ref 0.0–0.7)
HEMATOCRIT: 38.7 % (ref 36.0–46.0)
Hemoglobin: 12.8 g/dL (ref 12.0–15.0)
LYMPHS ABS: 3.5 10*3/uL (ref 0.7–4.0)
LYMPHS PCT: 53 % — AB (ref 12–46)
MCH: 30.3 pg (ref 26.0–34.0)
MCHC: 33.1 g/dL (ref 30.0–36.0)
MCV: 91.5 fL (ref 78.0–100.0)
MONO ABS: 0.3 10*3/uL (ref 0.1–1.0)
MONOS PCT: 5 % (ref 3–12)
MPV: 10.2 fL (ref 8.6–12.4)
NEUTROS ABS: 2.7 10*3/uL (ref 1.7–7.7)
Neutrophils Relative %: 41 % — ABNORMAL LOW (ref 43–77)
Platelets: 274 10*3/uL (ref 150–400)
RBC: 4.23 MIL/uL (ref 3.87–5.11)
RDW: 14.9 % (ref 11.5–15.5)
WBC: 6.6 10*3/uL (ref 4.0–10.5)

## 2015-06-04 LAB — COMPLETE METABOLIC PANEL WITH GFR
ALT: 19 U/L (ref 6–29)
AST: 18 U/L (ref 10–35)
Albumin: 4.4 g/dL (ref 3.6–5.1)
Alkaline Phosphatase: 70 U/L (ref 33–130)
BUN: 13 mg/dL (ref 7–25)
CHLORIDE: 100 mmol/L (ref 98–110)
CO2: 30 mmol/L (ref 20–31)
Calcium: 9.5 mg/dL (ref 8.6–10.4)
Creat: 1.01 mg/dL — ABNORMAL HIGH (ref 0.50–0.99)
GFR, EST AFRICAN AMERICAN: 68 mL/min (ref >=60)
GFR, EST NON AFRICAN AMERICAN: 59 mL/min — AB (ref >=60)
Glucose, Bld: 104 mg/dL — ABNORMAL HIGH (ref 65–99)
POTASSIUM: 3.7 mmol/L (ref 3.5–5.3)
SODIUM: 140 mmol/L (ref 135–146)
Total Bilirubin: 0.7 mg/dL (ref 0.2–1.2)
Total Protein: 7 g/dL (ref 6.1–8.1)

## 2015-06-04 LAB — HEMOGLOBIN A1C
Hgb A1c MFr Bld: 6.1 % — ABNORMAL HIGH (ref <5.7)
MEAN PLASMA GLUCOSE: 128 mg/dL — AB (ref <117)

## 2015-06-04 LAB — LIPID PANEL
Cholesterol: 175 mg/dL (ref 125–200)
HDL: 36 mg/dL — ABNORMAL LOW (ref >=46)
LDL CALC: 72 mg/dL (ref <130)
Total CHOL/HDL Ratio: 4.9 Ratio (ref <=5.0)
Triglycerides: 335 mg/dL — ABNORMAL HIGH (ref <150)
VLDL: 67 mg/dL — ABNORMAL HIGH (ref <30)

## 2015-06-04 LAB — POC HEMOCCULT BLD/STL (OFFICE/1-CARD/DIAGNOSTIC): Fecal Occult Blood, POC: NEGATIVE

## 2015-06-04 LAB — HEPATITIS C ANTIBODY: HCV Ab: NEGATIVE

## 2015-06-04 MED ORDER — CYCLOBENZAPRINE HCL 10 MG PO TABS: 10.0000 mg | ORAL_TABLET | Freq: Every day | ORAL | Status: DC

## 2015-06-04 NOTE — Progress Notes (Signed)
   Subjective:    Patient ID: Sarah Coleman, female    DOB: 04-May-1951, 64 y.o.   MRN: [id number]  HPI Patient is in for annual physical exam. Left neck spasm increased in past 2 weeks, responds to muscle relaxant , needs the med refilled Recent labs, if available are reviewed. Immunization is reviewed , and  updated if needed.    Review of Systems See HPI     Objective:   Physical Exam BP 136/92 mmHg  Pulse 98  Resp 18  Ht 5' 5.25" (1.657 m)  Wt 226 lb (102.513 kg)  BMI 37.34 kg/m2  SpO2 97%  Pleasant well nourished female, alert and oriented x 3, in no cardio-pulmonary distress. Afebrile. HEENT No facial trauma or asymetry. Sinuses non tender.  Extra occullar muscles intact, pupils equally reactive to light. External ears normal, tympanic membranes clear. Oropharynx moist, no exudate, upper and lower dentures Neck: mildly reduced ROM left neck with spasm, no adenopathy,JVD or thyromegaly.No bruits.  Chest: Clear to ascultation bilaterally.No crackles or wheezes. Non tender to palpation  Breast: No asymetry,no masses or lumps. No tenderness. No nipple discharge or inversion. No axillary or supraclavicular adenopathy  Cardiovascular system; Heart sounds normal,  S1 and  S2 ,no S3.  No murmur, or thrill. Apical beat not displaced Peripheral pulses normal.  Abdomen: Soft, non tender, no organomegaly or masses. No bruits. Bowel sounds normal. No guarding, tenderness or rebound.  Rectal:  Normal sphincter tone. No mass.No rectal masses.  Guaiac negative stool.  GU: External genitalia normal female genitalia , female distribution of hair. No lesions. Urethral meatus normal in size, no  Prolapse, no lesions visibly  Present. Bladder non tender. Vagina pink and moist , with no visible lesions , discharge present . Adequate pelvic support no  cystocele or rectocele noted Cervix pink and appears healthy, no lesions or ulcerations noted, no discharge noted from  os Uterus absent, no adnexal masses, no  adnexal tenderness.   Musculoskeletal exam: Full ROM of spine, hips , shoulders and knees. No deformity ,swelling or crepitus noted. No muscle wasting or atrophy.   Neurologic: Cranial nerves 2 to 12 intact. Power, tone ,sensation and reflexes normal throughout. No disturbance in gait. No tremor.  Skin: Intact, no ulceration, erythema , scaling or rash noted.Vitiligo present in patches  Psych; Normal mood and affect. Judgement and concentration normal        Assessment & Plan:  Annual physical exam Annual exam as documented. Counseling done  re healthy lifestyle involving commitment to 150 minutes exercise per week, heart healthy diet, and attaining healthy weight.The importance of adequate sleep also discussed. Regular seat belt use and home safety, is also discussed. Changes in health habits are decided on by the patient with goals and time frames  set for achieving them. Immunization and cancer screening needs are specifically addressed at this visit.

## 2015-06-04 NOTE — Assessment & Plan Note (Signed)

## 2015-06-04 NOTE — Patient Instructions (Addendum)
F/u in 4 months, call if you need me before  Fasting lipid, cmp, and EGFr, HBA1C   Pls work on healthier lifestyle as discussed, weight, blood sugar and triglycerides have increased  Manage stress in ways that will not harm your health  All the best  Thanks for choosing [facility], we consider it a privelige to serve you.

## 2015-07-16 ENCOUNTER — Ambulatory Visit (INDEPENDENT_AMBULATORY_CARE_PROVIDER_SITE_OTHER): Payer: PPO | Admitting: Family Medicine

## 2015-07-16 ENCOUNTER — Encounter: Payer: Self-pay | Admitting: Family Medicine

## 2015-07-16 VITALS — BP 130/84 | HR 84 | Temp 98.7°F | Resp 14 | Ht 66.0 in | Wt 226.1 lb

## 2015-07-16 DIAGNOSIS — I10 Essential (primary) hypertension: Secondary | ICD-10-CM

## 2015-07-16 DIAGNOSIS — J302 Other seasonal allergic rhinitis: Secondary | ICD-10-CM | POA: Diagnosis not present

## 2015-07-16 DIAGNOSIS — R42 Dizziness and giddiness: Secondary | ICD-10-CM

## 2015-07-16 DIAGNOSIS — H8190 Unspecified disorder of vestibular function, unspecified ear: Secondary | ICD-10-CM

## 2015-07-16 DIAGNOSIS — R7303 Prediabetes: Secondary | ICD-10-CM

## 2015-07-16 DIAGNOSIS — J209 Acute bronchitis, unspecified: Secondary | ICD-10-CM | POA: Insufficient documentation

## 2015-07-16 DIAGNOSIS — H8120 Vestibular neuronitis, unspecified ear: Secondary | ICD-10-CM | POA: Diagnosis not present

## 2015-07-16 MED ORDER — MECLIZINE HCL 12.5 MG PO TABS: 12.5000 mg | ORAL_TABLET | Freq: Three times a day (TID) | ORAL | Status: DC | PRN

## 2015-07-16 MED ORDER — PROMETHAZINE-DM 6.25-15 MG/5ML PO SYRP: ORAL_SOLUTION | ORAL | Status: DC

## 2015-07-16 MED ORDER — AZITHROMYCIN 250 MG PO TABS: ORAL_TABLET | ORAL | Status: DC

## 2015-07-16 MED ORDER — PREDNISONE 5 MG PO TABS
5.0000 mg | ORAL_TABLET | Freq: Two times a day (BID) | ORAL | Status: AC
Start: 2015-07-16 — End: 2015-07-20

## 2015-07-16 NOTE — Patient Instructions (Signed)
F/u in 4 month, call if you need me before  You are treated for acute bronchitis and vertigo , medications are sent in  Thanks for choosing [facility], we consider it a privelige to serve you.  Please work on good  health habits so that your health will improve. 1. Commitment to daily physical activity for 30 to 60  minutes, if you are able to do this.  2. Commitment to wise food choices. Aim for half of your  food intake to be vegetable and fruit, one quarter starchy foods, and one quarter protein. Try to eat on a regular schedule  3 meals per day, snacking between meals should be limited to vegetables or fruits or small portions of nuts. 64 ounces of water per day is generally recommended, unless you have specific health conditions, like heart failure or kidney failure where you will need to limit fluid intake.  3. Commitment to sufficient and a  good quality of physical and mental rest daily, generally between 6 to 8 hours per day.  WITH PERSISTANCE AND PERSEVERANCE, THE IMPOSSIBLE , BECOMES THE NORM!

## 2015-07-16 NOTE — Progress Notes (Signed)
Subjective:    Patient ID: Sarah Coleman, female    DOB: [birth year], 64 y.o.   MRN: [id number]  HPI   Sarah Coleman     MRN: [id number]      DOB: 1951-03-17   HPI Sarah Coleman is here for follow up and re-evaluation of chronic medical conditions, medication management and review of any available recent lab and radiology data.  Preventive health is updated, specifically  Cancer screening and Immunization.   Questions or concerns regarding consultations or procedures which the PT has had in the interim are  addressed. The PT denies any adverse reactions to current medications since the last visit.   4 day  h/o worsening  chest congestion, associated with  chills intermittently. Nasal drainage is clear. C/o bilateral ear pressure, denies hearing loss and sore throat. Increasing fatigue , poor appetitie and sleep disturbed by cough. No improvement with OTC medication.  ROS Denies recent fever or chills. Denies sinus pressure, nasal congestion, ear pain or sore throat. Denies chest congestion, productive cough or wheezing. Denies chest pains, palpitations and leg swelling Denies abdominal pain, nausea, vomiting,diarrhea or constipation.   Denies dysuria, frequency, hesitancy or incontinence. Denies joint pain, swelling and limitation in mobility. Denies headaches, seizures, numbness, or tingling. Denies depression, anxiety or insomnia. Denies skin break down or rash.   PE  BP 130/84 mmHg  Pulse 84  Temp(Src) 98.7 F (37.1 C)  Resp 14  Ht 5\' 6"  (1.676 m)  Wt 226 lb 1.9 oz (102.567 kg)  BMI 36.51 kg/m2  SpO2 97%  Patient alert and oriented and in no cardiopulmonary distress.  HEENT: No facial asymmetry, EOMI,   oropharynx pink and moist.  Neck supple no JVD, no mass. No sinus tenderness, TM clear bilaterally, no nystagmus Chest: decreased though adequate air entry, scattered crackles and few wheezes  CVS: S1, S2 no murmurs, no S3.Regular rate.  ABD: Soft non tender.    Ext: No edema  MS: Adequate ROM spine, shoulders, hips and knees.  Skin: Intact, no ulcerations or rash noted.  Psych: Good eye contact, normal affect. Memory intact not anxious or depressed appearing.  CNS: CN 2-12 intact, power,  normal throughout.no focal deficits noted.   Assessment & Plan   Acute bronchitis Antibiotic , decongestant and cough suppressant prescribed . Pt to return in 2 to  3 weeks for flu vaccine    Essential hypertension Controlled, no change in medication DASH diet and commitment to daily physical activity for a minimum of 30 minutes discussed and encouraged, as a part of hypertension management. The importance of attaining a healthy weight is also discussed.  BP/Weight 07/16/2015 06/04/2015 01/08/2015 12/22/2014 07/28/2014 03/26/2014 [id number]  Systolic BP [id number]  Diastolic BP 84 92 81 84 82 82 86  Wt. (Lbs) 226.12 226 222.8 222 220 218 226.08  BMI 36.51 37.34 35.98 36.68 35.53 35.2 36.51        Seasonal allergies Currently uncontrolled, pt advised to use nasal spray daily as directed until improved  Morbid obesity Deteriorated. Patient re-educated about  the importance of commitment to a  minimum of 150 minutes of exercise per week.  The importance of healthy food choices with portion control discussed. Encouraged to start a food diary, count calories and to consider  joining a support group. Sample diet sheets offered. Goals set by the patient for the next several months.   Weight /BMI 07/16/2015 06/04/2015 01/08/2015  WEIGHT 226 lb 1.9 oz 226  lb 222 lb 12.8 oz  HEIGHT 5\' 6"  5' 5.25" 5\' 6"   BMI 36.51 kg/m2 37.34 kg/m2 35.98 kg/m2    Current exercise per week 90 minutes.   Prediabetes Patient educated about the importance of limiting  Carbohydrate intake , the need to commit to daily physical activity for a minimum of 30 minutes , and to commit weight loss. The fact that changes in all these areas will reduce or  eliminate all together the development of diabetes is stressed.  Unchanged. Updated lab needed at/ before next visit.   Diabetic Labs Latest Ref Rng 06/03/2015 12/16/2014 07/23/2014 03/26/2014 11/29/2013  HbA1c <5.7 % 6.1(H) 6.1(H) 6.0(H) 6.1(H) 6.0(H)  Chol 125 - 200 mg/dL 175 182 178 - 162  HDL >=46 mg/dL 36(L) 37(L) 43 - 42  Calc LDL <130 mg/dL 72 73 78 - 70  Triglycerides <150 mg/dL 335(H) 360(H) 285(H) - 249(H)  Creatinine 0.50 - 0.99 mg/dL 1.01(H) 0.98 0.94 0.98 0.94   BP/Weight 07/16/2015 06/04/2015 01/08/2015 12/22/2014 07/28/2014 03/26/2014 [id number]  Systolic BP [id number]  Diastolic BP 84 92 81 84 82 82 86  Wt. (Lbs) 226.12 226 222.8 222 220 218 226.08  BMI 36.51 37.34 35.98 36.68 35.53 35.2 36.51   No flowsheet data found.     Episodic recurrent vertigo Current flare, antivert prescribed , to be used as needed, if persists, she will call for ENT eval       Review of Systems     Objective:   Physical Exam        Assessment & Plan:

## 2015-07-18 ENCOUNTER — Other Ambulatory Visit: Payer: Self-pay | Admitting: Family Medicine

## 2015-07-18 NOTE — Assessment & Plan Note (Signed)
Current flare, antivert prescribed , to be used as needed, if persists, she will call for ENT eval

## 2015-07-18 NOTE — Assessment & Plan Note (Addendum)
Antibiotic , decongestant and cough suppressant prescribed . Pt to return in 2 to  3 weeks for flu vaccine

## 2015-07-18 NOTE — Assessment & Plan Note (Signed)
Currently uncontrolled, pt advised to use nasal spray daily as directed until improved

## 2015-07-18 NOTE — Assessment & Plan Note (Signed)
Controlled, no change in medication DASH diet and commitment to daily physical activity for a minimum of 30 minutes discussed and encouraged, as a part of hypertension management. The importance of attaining a healthy weight is also discussed.  BP/Weight 07/16/2015 06/04/2015 01/08/2015 12/22/2014 07/28/2014 03/26/2014 [id number]  Systolic BP [id number]  Diastolic BP 84 92 81 84 82 82 86  Wt. (Lbs) 226.12 226 222.8 222 220 218 226.08  BMI 36.51 37.34 35.98 36.68 35.53 35.2 36.51

## 2015-07-18 NOTE — Assessment & Plan Note (Signed)
Deteriorated. Patient re-educated about  the importance of commitment to a  minimum of 150 minutes of exercise per week.  The importance of healthy food choices with portion control discussed. Encouraged to start a food diary, count calories and to consider  joining a support group. Sample diet sheets offered. Goals set by the patient for the next several months.   Weight /BMI 07/16/2015 06/04/2015 01/08/2015  WEIGHT 226 lb 1.9 oz 226 lb 222 lb 12.8 oz  HEIGHT 5\' 6"  5' 5.25" 5\' 6"   BMI 36.51 kg/m2 37.34 kg/m2 35.98 kg/m2    Current exercise per week 90 minutes.

## 2015-07-18 NOTE — Assessment & Plan Note (Signed)
Patient educated about the importance of limiting  Carbohydrate intake , the need to commit to daily physical activity for a minimum of 30 minutes , and to commit weight loss. The fact that changes in all these areas will reduce or eliminate all together the development of diabetes is stressed.  Unchanged. Updated lab needed at/ before next visit.   Diabetic Labs Latest Ref Rng 06/03/2015 12/16/2014 07/23/2014 03/26/2014 11/29/2013  HbA1c <5.7 % 6.1(H) 6.1(H) 6.0(H) 6.1(H) 6.0(H)  Chol 125 - 200 mg/dL 175 182 178 - 162  HDL >=46 mg/dL 36(L) 37(L) 43 - 42  Calc LDL <130 mg/dL 72 73 78 - 70  Triglycerides <150 mg/dL 335(H) 360(H) 285(H) - 249(H)  Creatinine 0.50 - 0.99 mg/dL 1.01(H) 0.98 0.94 0.98 0.94   BP/Weight 07/16/2015 06/04/2015 01/08/2015 12/22/2014 07/28/2014 03/26/2014 [id number]  Systolic BP [id number] 123456 123456 0000000  Diastolic BP 84 92 81 84 82 82 86  Wt. (Lbs) 226.12 226 222.8 222 220 218 226.08  BMI 36.51 37.34 35.98 36.68 35.53 35.2 36.51   No flowsheet data found.

## 2015-07-27 ENCOUNTER — Other Ambulatory Visit: Payer: Self-pay | Admitting: Family Medicine

## 2015-07-27 DIAGNOSIS — Z1231 Encounter for screening mammogram for malignant neoplasm of breast: Secondary | ICD-10-CM

## 2015-07-28 ENCOUNTER — Other Ambulatory Visit: Payer: Self-pay | Admitting: Family Medicine

## 2015-07-29 ENCOUNTER — Ambulatory Visit (HOSPITAL_COMMUNITY)
Admission: RE | Admit: 2015-07-29 | Discharge: 2015-07-29 | Disposition: A | Discharge status: Home / Self Care | Payer: PPO | Source: Ambulatory Visit | Attending: Family Medicine | Admitting: Family Medicine

## 2015-07-29 ENCOUNTER — Other Ambulatory Visit: Payer: Self-pay | Admitting: Family Medicine

## 2015-07-29 DIAGNOSIS — Z1231 Encounter for screening mammogram for malignant neoplasm of breast: Secondary | ICD-10-CM | POA: Diagnosis not present

## 2015-08-11 ENCOUNTER — Other Ambulatory Visit: Payer: Self-pay | Admitting: Family Medicine

## 2015-08-25 ENCOUNTER — Other Ambulatory Visit: Payer: Self-pay | Admitting: Family Medicine

## 2015-09-28 ENCOUNTER — Ambulatory Visit: Payer: PPO | Admitting: Family Medicine

## 2015-10-02 DIAGNOSIS — R7303 Prediabetes: Secondary | ICD-10-CM | POA: Diagnosis not present

## 2015-10-02 DIAGNOSIS — E785 Hyperlipidemia, unspecified: Secondary | ICD-10-CM | POA: Diagnosis not present

## 2015-10-02 DIAGNOSIS — I10 Essential (primary) hypertension: Secondary | ICD-10-CM | POA: Diagnosis not present

## 2015-10-02 DIAGNOSIS — R7309 Other abnormal glucose: Secondary | ICD-10-CM | POA: Diagnosis not present

## 2015-10-02 LAB — HEMOGLOBIN A1C
HEMOGLOBIN A1C: 6.4 % — AB (ref <5.7)
Mean Plasma Glucose: 137 mg/dL — ABNORMAL HIGH (ref <117)

## 2015-10-03 LAB — COMPLETE METABOLIC PANEL WITH GFR
ALK PHOS: 69 U/L (ref 33–130)
ALT: 21 U/L (ref 6–29)
AST: 23 U/L (ref 10–35)
Albumin: 4.6 g/dL (ref 3.6–5.1)
BILIRUBIN TOTAL: 0.5 mg/dL (ref 0.2–1.2)
BUN: 9 mg/dL (ref 7–25)
CALCIUM: 10.1 mg/dL (ref 8.6–10.4)
CO2: 28 mmol/L (ref 20–31)
CREATININE: 1.02 mg/dL — AB (ref 0.50–0.99)
Chloride: 95 mmol/L — ABNORMAL LOW (ref 98–110)
GFR, EST AFRICAN AMERICAN: 67 mL/min (ref >=60)
GFR, Est Non African American: 58 mL/min — ABNORMAL LOW (ref >=60)
Glucose, Bld: 116 mg/dL — ABNORMAL HIGH (ref 65–99)
Potassium: 3.6 mmol/L (ref 3.5–5.3)
Sodium: 135 mmol/L (ref 135–146)
TOTAL PROTEIN: 7.8 g/dL (ref 6.1–8.1)

## 2015-10-03 LAB — LIPID PANEL
CHOLESTEROL: 206 mg/dL — AB (ref 125–200)
HDL: 40 mg/dL — ABNORMAL LOW (ref >=46)
TRIGLYCERIDES: 462 mg/dL — AB (ref <150)
Total CHOL/HDL Ratio: 5.2 Ratio — ABNORMAL HIGH (ref <=5.0)

## 2015-10-07 ENCOUNTER — Ambulatory Visit (INDEPENDENT_AMBULATORY_CARE_PROVIDER_SITE_OTHER): Payer: PPO | Admitting: Family Medicine

## 2015-10-07 ENCOUNTER — Encounter: Payer: Self-pay | Admitting: Family Medicine

## 2015-10-07 VITALS — BP 118/80 | HR 89 | Resp 16 | Ht 66.0 in | Wt 227.0 lb

## 2015-10-07 DIAGNOSIS — E785 Hyperlipidemia, unspecified: Secondary | ICD-10-CM | POA: Diagnosis not present

## 2015-10-07 DIAGNOSIS — R7303 Prediabetes: Secondary | ICD-10-CM | POA: Diagnosis not present

## 2015-10-07 DIAGNOSIS — R42 Dizziness and giddiness: Secondary | ICD-10-CM

## 2015-10-07 DIAGNOSIS — H8120 Vestibular neuronitis, unspecified ear: Secondary | ICD-10-CM

## 2015-10-07 DIAGNOSIS — E8881 Metabolic syndrome: Secondary | ICD-10-CM

## 2015-10-07 DIAGNOSIS — J302 Other seasonal allergic rhinitis: Secondary | ICD-10-CM | POA: Diagnosis not present

## 2015-10-07 DIAGNOSIS — H8190 Unspecified disorder of vestibular function, unspecified ear: Secondary | ICD-10-CM

## 2015-10-07 DIAGNOSIS — I10 Essential (primary) hypertension: Secondary | ICD-10-CM | POA: Diagnosis not present

## 2015-10-07 MED ORDER — PREDNISONE 5 MG (21) PO TBPK: 5.0000 mg | ORAL_TABLET | ORAL | Status: DC

## 2015-10-07 MED ORDER — LORATADINE 10 MG PO TABS
10.0000 mg | ORAL_TABLET | Freq: Every day | ORAL | Status: DC
Start: 2015-10-07 — End: 2016-10-21

## 2015-10-07 MED ORDER — METHYLPREDNISOLONE ACETATE 80 MG/ML IJ SUSP
80.0000 mg | Freq: Once | INTRAMUSCULAR | Status: AC
  Administered 2015-10-07: 80 mg via INTRAMUSCULAR
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
## 2015-10-07 NOTE — Assessment & Plan Note (Signed)
Uncontrolled, needs to start daily astellin, short course oral steroids and depo medrol in office

## 2015-10-07 NOTE — Patient Instructions (Addendum)
Annual wellness in 4 month, call if you need me before  Prednisone dose pack , claritin and astellin daily for unconrtrolled allergies  Fasting lipid, cmp , HBA1C and TSH in 4 months  Depomedrol in office today

## 2015-10-07 NOTE — Assessment & Plan Note (Signed)
Current flare , ensure antivert available or as needed use. No nystagmus on exam

## 2015-10-07 NOTE — Assessment & Plan Note (Signed)
Unchanged. Patient re-educated about  the importance of commitment to a  minimum of 150 minutes of exercise per week.  The importance of healthy food choices with portion control discussed. Encouraged to start a food diary, count calories and to consider  joining a support group. Sample diet sheets offered. Goals set by the patient for the next several months.   Weight /BMI 10/07/2015 07/16/2015 06/04/2015  WEIGHT 227 lb 226 lb 1.9 oz 226 lb  HEIGHT 5\' 6"  5\' 6"  5' 5.25"  BMI 36.66 kg/m2 36.51 kg/m2 37.34 kg/m2    Current exercise per week 90 minutes.

## 2015-10-07 NOTE — Assessment & Plan Note (Signed)
Deteriorated Hyperlipidemia:Low fat diet discussed and encouraged.   Lipid Panel  Lab Results  Component Value Date   CHOL 206* 10/02/2015   HDL 40* 10/02/2015   LDLCALC NOT CALC 10/02/2015   LDLDIRECT 69 02/13/2009   TRIG 462* 10/02/2015   CHOLHDL 5.2* 10/02/2015

## 2015-10-07 NOTE — Assessment & Plan Note (Signed)
Deteriorated  Patient educated about the importance of limiting  Carbohydrate intake , the need to commit to daily physical activity for a minimum of 30 minutes , and to commit weight loss. The fact that changes in all these areas will reduce or eliminate all together the development of diabetes is stressed.   Diabetic Labs Latest Ref Rng 10/02/2015 06/03/2015 12/16/2014 07/23/2014 03/26/2014  HbA1c <5.7 % 6.4(H) 6.1(H) 6.1(H) 6.0(H) 6.1(H)  Chol 125 - 200 mg/dL 206(H) 175 182 178 -  HDL >=46 mg/dL 40(L) 36(L) 37(L) 43 -  Calc LDL <130 mg/dL NOT CALC 72 73 78 -  Triglycerides <150 mg/dL 462(H) 335(H) 360(H) 285(H) -  Creatinine 0.50 - 0.99 mg/dL 1.02(H) 1.01(H) 0.98 0.94 0.98   BP/Weight 10/07/2015 07/16/2015 06/04/2015 01/08/2015 12/22/2014 07/28/2014 123XX123  Systolic BP 123456 [id number] [id number] 123456 123456  Diastolic BP 80 84 92 81 84 82 82  Wt. (Lbs) 227 226.12 226 222.8 222 220 218  BMI 36.66 36.51 37.34 35.98 36.68 35.53 35.2   No flowsheet data found.

## 2015-10-07 NOTE — Assessment & Plan Note (Signed)
The increased risk of cardiovascular disease associated with this diagnosis, and the need to consistently work on lifestyle to change this is discussed. Following  a  heart healthy diet ,commitment to 30 minutes of exercise at least 5 days per week, as well as control of blood sugar and cholesterol , and achieving a healthy weight are all the areas to be addressed .  

## 2015-10-16 ENCOUNTER — Other Ambulatory Visit: Payer: Self-pay | Admitting: Family Medicine

## 2015-10-23 ENCOUNTER — Other Ambulatory Visit: Payer: Self-pay | Admitting: Family Medicine

## 2015-11-05 ENCOUNTER — Other Ambulatory Visit: Payer: Self-pay | Admitting: Family Medicine

## 2015-11-11 ENCOUNTER — Encounter: Payer: Self-pay | Admitting: Family Medicine

## 2015-11-11 ENCOUNTER — Ambulatory Visit (INDEPENDENT_AMBULATORY_CARE_PROVIDER_SITE_OTHER): Payer: PPO | Admitting: Family Medicine

## 2015-11-11 VITALS — BP 126/80 | HR 86 | Temp 98.2°F | Resp 18 | Ht 66.0 in | Wt 231.0 lb

## 2015-11-11 DIAGNOSIS — H6501 Acute serous otitis media, right ear: Secondary | ICD-10-CM | POA: Diagnosis not present

## 2015-11-11 DIAGNOSIS — J302 Other seasonal allergic rhinitis: Secondary | ICD-10-CM

## 2015-11-11 DIAGNOSIS — H6691 Otitis media, unspecified, right ear: Secondary | ICD-10-CM | POA: Insufficient documentation

## 2015-11-11 DIAGNOSIS — I10 Essential (primary) hypertension: Secondary | ICD-10-CM

## 2015-11-11 DIAGNOSIS — N3000 Acute cystitis without hematuria: Secondary | ICD-10-CM | POA: Diagnosis not present

## 2015-11-11 DIAGNOSIS — J320 Chronic maxillary sinusitis: Secondary | ICD-10-CM

## 2015-11-11 LAB — POCT URINALYSIS DIPSTICK
BILIRUBIN UA: NEGATIVE
Blood, UA: NEGATIVE
GLUCOSE UA: NEGATIVE
KETONES UA: NEGATIVE
Nitrite, UA: NEGATIVE
PROTEIN UA: NEGATIVE
SPEC GRAV UA: 1.015
Urobilinogen, UA: 0.2
pH, UA: 7

## 2015-11-11 MED ORDER — METHYLPREDNISOLONE ACETATE 80 MG/ML IJ SUSP
80.0000 mg | Freq: Once | INTRAMUSCULAR | Status: AC
  Administered 2015-11-11: 80 mg via INTRAMUSCULAR

## 2015-11-11 MED ORDER — FLUCONAZOLE 150 MG PO TABS
ORAL_TABLET | ORAL | Status: DC
Start: 2015-11-11 — End: 2016-01-25

## 2015-11-11 MED ORDER — PREDNISONE 5 MG (21) PO TBPK
5.0000 mg | ORAL_TABLET | ORAL | Status: DC
Start: 2015-11-11 — End: 2016-01-25

## 2015-11-11 MED ORDER — LEVOFLOXACIN 500 MG PO TABS: 500.0000 mg | ORAL_TABLET | Freq: Every day | ORAL | Status: DC

## 2015-11-11 NOTE — Assessment & Plan Note (Signed)
Uncontrolled depo medrol 80 mg IM in office , then pred dose pack

## 2015-11-11 NOTE — Patient Instructions (Addendum)
Keep f/u as before, call if you need me sooner  You are treated for right ear infection, sinus infection and uncontrolled allergies  Depo medrol  Given in office and prednisone and levaquin  Sent in for 1 week, also 2 fluconazole tablets  Urine is being checked for infection and nurse will let you know  Please do not take cholesterol medication , lovastatin , on the days that you take fluconazole

## 2015-11-11 NOTE — Progress Notes (Signed)
   Subjective:    Patient ID: Sarah Coleman, female    DOB: [birth year], 65 y.o.   MRN: [id number]  HPI   Sarah Coleman     MRN: [id number]      DOB: 1951-07-07   HPI Sarah Coleman is here c/o 1 week h/o increased facial pressure, and right ear pain,  chills, headache and uncontrolled allergy symptoms. 3 day h/o increased urinary freqency with mild discomfort.  ROS Denies chest pains, palpitations and leg swelling Denies abdominal pain, nausea, vomiting,diarrhea or constipation.    Denies joint pain, swelling and limitation in mobility. Denies headaches, seizures, numbness, or tingling. Denies depression, anxiety or insomnia. Denies skin break down or rash.   PE  BP 126/80 mmHg  Pulse 86  Temp(Src) 98.2 F (36.8 C)  Resp 18  Ht 5\' 6"  (1.676 m)  Wt 231 lb (104.781 kg)  BMI 37.30 kg/m2  SpO2 96%  Patient alert and oriented and in no cardiopulmonary distress.  HEENT: No facial asymmetry, EOMI,   oropharynx pink and moist.  Neck supple no JVD, no mass. Right maxillary sinus tenderness, Right TM erythematous. Erythema and edema of nasal mucosa Chest: Clear to auscultation bilaterally.  CVS: S1, S2 no murmurs, no S3.Regular rate.  ABD: Soft no suprapubic or renal angle tenderness   Ext: No edema  MS: Adequate ROM spine, shoulders, hips and knees.  Skin: Intact, no ulcerations or rash noted.  Psych: Good eye contact, normal affect. Memory intact not anxious or depressed appearing.  CNS: CN 2-12 intact, power,  normal throughout.no focal deficits noted.   Assessment & Plan Essential hypertension Controlled, no change in medication DASH diet and commitment to daily physical activity for a minimum of 30 minutes discussed and encouraged, as a part of hypertension management. The importance of attaining a healthy weight is also discussed.  BP/Weight 11/11/2015 10/07/2015 07/16/2015 06/04/2015 01/08/2015 12/22/2014 [id number]  Systolic BP 123XX123 123456 [id number] 116 123XX123 123456    Diastolic BP 80 80 84 92 81 84 82  Wt. (Lbs) 231 227 226.12 226 222.8 222 220  BMI 37.3 36.66 36.51 37.34 35.98 36.68 35.53        Seasonal allergies Uncontrolled depo medrol 80 mg IM in office , then pred dose pack  Right maxillary sinusitis Antibiotic prescribed, pt encouraged to do saline flushes daily also  ROM (right otitis media) antibiotic prescribed  Morbid obesity Deteriorated. Patient re-educated about  the importance of commitment to a  minimum of 150 minutes of exercise per week.  The importance of healthy food choices with portion control discussed. Encouraged to start a food diary, count calories and to consider  joining a support group. Sample diet sheets offered. Goals set by the patient for the next several months.   Weight /BMI 11/11/2015 10/07/2015 07/16/2015  WEIGHT 231 lb 227 lb 226 lb 1.9 oz  HEIGHT 5\' 6"  5\' 6"  5\' 6"   BMI 37.3 kg/m2 36.66 kg/m2 36.51 kg/m2    Current exercise per week 60 minutes.   Acute cystitis without hematuria Symptomatic with abnormal UA, levaquin prescribed         Review of Systems     Objective:   Physical Exam        Assessment & Plan:

## 2015-11-11 NOTE — Assessment & Plan Note (Signed)
Controlled, no change in medication DASH diet and commitment to daily physical activity for a minimum of 30 minutes discussed and encouraged, as a part of hypertension management. The importance of attaining a healthy weight is also discussed.  BP/Weight 11/11/2015 10/07/2015 07/16/2015 06/04/2015 01/08/2015 12/22/2014 [id number]  Systolic BP 123XX123 123456 [id number] [id number] 123456  Diastolic BP 80 80 84 92 81 84 82  Wt. (Lbs) 231 227 226.12 226 222.8 222 220  BMI 37.3 36.66 36.51 37.34 35.98 36.68 35.53

## 2015-11-12 ENCOUNTER — Other Ambulatory Visit: Payer: Self-pay | Admitting: Family Medicine

## 2015-11-12 DIAGNOSIS — N3090 Cystitis, unspecified without hematuria: Secondary | ICD-10-CM | POA: Insufficient documentation

## 2015-11-12 NOTE — Assessment & Plan Note (Signed)
Antibiotic prescribed, pt encouraged to do saline flushes daily also

## 2015-11-12 NOTE — Assessment & Plan Note (Signed)
antibiotic prescribed 

## 2015-11-12 NOTE — Assessment & Plan Note (Signed)
Symptomatic with abnormal UA, levaquin prescribed

## 2015-11-12 NOTE — Assessment & Plan Note (Signed)
Deteriorated. Patient re-educated about  the importance of commitment to a  minimum of 150 minutes of exercise per week.  The importance of healthy food choices with portion control discussed. Encouraged to start a food diary, count calories and to consider  joining a support group. Sample diet sheets offered. Goals set by the patient for the next several months.   Weight /BMI 11/11/2015 10/07/2015 07/16/2015  WEIGHT 231 lb 227 lb 226 lb 1.9 oz  HEIGHT 5\' 6"  5\' 6"  5\' 6"   BMI 37.3 kg/m2 36.66 kg/m2 36.51 kg/m2    Current exercise per week 60 minutes.

## 2015-11-13 LAB — URINE CULTURE: Colony Count: 85000

## 2015-11-17 ENCOUNTER — Other Ambulatory Visit: Payer: Self-pay | Admitting: Family Medicine

## 2015-11-30 ENCOUNTER — Telehealth: Payer: Self-pay | Admitting: Family Medicine

## 2015-11-30 NOTE — Telephone Encounter (Signed)
States it burns when she uses the estrogen cream and will call the dr that prescribes it and ask if that could be irritating her vaginia and call me back

## 2015-11-30 NOTE — Telephone Encounter (Signed)
Patient is asking for test results, please advise?

## 2015-12-01 DIAGNOSIS — H40033 Anatomical narrow angle, bilateral: Secondary | ICD-10-CM | POA: Diagnosis not present

## 2015-12-01 DIAGNOSIS — H1013 Acute atopic conjunctivitis, bilateral: Secondary | ICD-10-CM | POA: Diagnosis not present

## 2015-12-25 ENCOUNTER — Other Ambulatory Visit: Payer: Self-pay | Admitting: Family Medicine

## 2016-01-15 ENCOUNTER — Other Ambulatory Visit: Payer: Self-pay | Admitting: Family Medicine

## 2016-01-19 ENCOUNTER — Telehealth: Payer: Self-pay | Admitting: Family Medicine

## 2016-01-19 NOTE — Telephone Encounter (Signed)
Sarah Coleman is asking if she is not having any sinus trouble at this time does she need to have the   azelastine (ASTELIN) 0.1 % nasal spray refilled, please advise?

## 2016-01-19 NOTE — Telephone Encounter (Signed)
Called and left message for patient.  Notified that she could stop medication if she is not currently having any sinus problems.

## 2016-01-25 ENCOUNTER — Ambulatory Visit (INDEPENDENT_AMBULATORY_CARE_PROVIDER_SITE_OTHER): Payer: PPO | Admitting: Cardiology

## 2016-01-25 ENCOUNTER — Encounter: Payer: Self-pay | Admitting: Cardiology

## 2016-01-25 VITALS — BP 122/84 | HR 86 | Ht 66.0 in | Wt 229.6 lb

## 2016-01-25 DIAGNOSIS — E785 Hyperlipidemia, unspecified: Secondary | ICD-10-CM | POA: Diagnosis not present

## 2016-01-25 DIAGNOSIS — I10 Essential (primary) hypertension: Secondary | ICD-10-CM

## 2016-01-25 DIAGNOSIS — R9431 Abnormal electrocardiogram [ECG] [EKG]: Secondary | ICD-10-CM

## 2016-01-25 NOTE — Progress Notes (Signed)
Patient ID: ARMINE JAEN, female   DOB: Jul 27, 1951, 65 y.o.   MRN: [id number]     Clinical Summary Ms. Ariaz is a 65 y.o.female seen today for follow up of the following medical problems.   1. HTN - compliant with meds - checks bp at home, typically around 130/80s.   2. Hyperlipidemia - high TGs, reports dietary indiscretions at home.  - compliant with statin, followed by pcp  Past Medical History  Diagnosis Date  . Allergic rhinitis   . Obesity   . Hypertension   . Hyperlipidemia   . Prediabetes 2010  . GERD (gastroesophageal reflux disease) 2013  . Morbid obesity ([place]) 2000  . Metabolic syndrome X [id number]  . Allergy      Allergies  Allergen Reactions  . Ibuprofen   . Metformin And Related Dermatitis    Depigmentation of hands and wrists in 4 first 4 months of use, resembles vitiligo  . Penicillins      Current Outpatient Prescriptions  Medication Sig Dispense Refill  . aspirin EC 81 MG tablet Take 1 tablet (81 mg total) by mouth daily. 150 tablet 2  . azelastine (ASTELIN) 0.1 % nasal spray USE TWO SPRAY(S) IN EACH NOSTRIL TWICE DAILY AS DIRECTED 30 mL 3  . CALCIUM-MAGNESIUM-ZINC PO Take 1 tablet by mouth 3 (three) times daily.    . cyclobenzaprine (FLEXERIL) 10 MG tablet TAKE ONE TABLET BY MOUTH AT BEDTIME 30 tablet 3  . estradiol (ESTRACE) 0.1 MG/GM vaginal cream Place 1 Applicatorful vaginally 3 (three) times a week.    . fluconazole (DIFLUCAN) 150 MG tablet One tablet once daily, as needed, for vaginal itch associated with antibiotic use 2 tablet 0  . fluticasone (FLONASE) 50 MCG/ACT nasal spray USE TWO SPRAY(S) IN EACH NOSTRIL ONCE DAILY 16 g 3  . Iron-Vitamins (GERITOL PO) Take 1 tablet by mouth daily.    Marland Kitchen levofloxacin (LEVAQUIN) 500 MG tablet Take 1 tablet (500 mg total) by mouth daily. 7 tablet 0  . loratadine (CLARITIN) 10 MG tablet Take 1 tablet (10 mg total) by mouth daily. 30 tablet 11  . lovastatin (MEVACOR) 20 MG tablet TAKE ONE TABLET BY MOUTH AT  BEDTIME 90 tablet 0  . meclizine (ANTIVERT) 12.5 MG tablet TAKE ONE TABLET BY MOUTH THREE TIMES DAILY AS NEEDED FOR DIZZINESS 30 tablet 0  . naproxen sodium (ANAPROX) 220 MG tablet Take 220 mg by mouth as needed.    . Omega-3 Fatty Acids (OMEGA-3 FISH OIL PO) Take 4 tablets by mouth daily. Omega xl    . pantoprazole (PROTONIX) 20 MG tablet TAKE ONE TABLET BY MOUTH ONCE DAILY 90 tablet 1  . potassium chloride (K-DUR) 10 MEQ tablet TAKE ONE TABLET BY MOUTH TWICE DAILY 60 tablet 2  . predniSONE (STERAPRED UNI-PAK 21 TAB) 5 MG (21) TBPK tablet Take 1 tablet (5 mg total) by mouth as directed. Use as directed 21 tablet 0  . ranitidine (ZANTAC) 150 MG capsule Take 150 mg by mouth as needed for heartburn. Reported on 10/07/2015    . triamterene-hydrochlorothiazide (MAXZIDE) 75-50 MG tablet TAKE ONE TABLET BY MOUTH ONCE DAILY 90 tablet 0   No current facility-administered medications for this visit.     Past Surgical History  Procedure Laterality Date  . Partial hysterectomy  1987    secondary to ovarian cyst   . Tubal ligation  1975     Allergies  Allergen Reactions  . Ibuprofen   . Metformin And Related Dermatitis    Depigmentation of  hands and wrists in 4 first 4 months of use, resembles vitiligo  . Penicillins       Family History  Problem Relation Age of Onset  . Heart attack Mother   . Heart disease Mother 12    massive heart attack  . Prostate cancer Father   . Cancer Father     colon  . Heart disease Brother   . Hypertension Sister   . Ovarian cancer Sister   . Cancer Brother 17    oral   . Hypertension Brother      Social History Ms. Dunlevy reports that she quit smoking about 41 years ago. She quit smokeless tobacco use about 41 years ago. Ms. Baumhover reports that she does not drink alcohol.   Review of Systems CONSTITUTIONAL: No weight loss, fever, chills, weakness or fatigue.  HEENT: Eyes: No visual loss, blurred vision, double vision or yellow sclerae.No hearing  loss, sneezing, congestion, runny nose or sore throat.  SKIN: No rash or itching.  CARDIOVASCULAR: no chest pain, no palpitations.  RESPIRATORY: No shortness of breath, cough or sputum.  GASTROINTESTINAL: No anorexia, nausea, vomiting or diarrhea. No abdominal pain or blood.  GENITOURINARY: No burning on urination, no polyuria NEUROLOGICAL: No headache, dizziness, syncope, paralysis, ataxia, numbness or tingling in the extremities. No change in bowel or bladder control.  MUSCULOSKELETAL: No muscle, back pain, joint pain or stiffness.  LYMPHATICS: No enlarged nodes. No history of splenectomy.  PSYCHIATRIC: No history of depression or anxiety.  ENDOCRINOLOGIC: No reports of sweating, cold or heat intolerance. No polyuria or polydipsia.  Marland Kitchen   Physical Examination Filed Vitals:   01/25/16 1037  BP: 122/84  Pulse: 86   Filed Vitals:   01/25/16 1037  Height: 5\' 6"  (1.676 m)  Weight: 229 lb 9.6 oz (104.146 kg)    Gen: resting comfortably, no acute distress HEENT: no scleral icterus, pupils equal round and reactive, no palptable cervical adenopathy,  CV: RRR, no m/r/g, no jvd Resp: Clear to auscultation bilaterally GI: abdomen is soft, non-tender, non-distended, normal bowel sounds, no hepatosplenomegaly MSK: extremities are warm, no edema.  Skin: warm, no rash Neuro:  no focal deficits Psych: appropriate affect   Diagnostic Studies EKG: SR, non-specific ST/T changes  01/2015 echo Study Conclusions  - Left ventricle: The cavity size was normal. Wall thickness was  normal. Systolic function was normal. The estimated ejection  fraction was in the range of 60% to 65%. Wall motion was normal;  there were no regional wall motion abnormalities. Doppler  parameters are consistent with abnormal left ventricular  relaxation (grade 1 diastolic dysfunction). - Mitral valve: There was trivial regurgitation. - Right atrium: Central venous pressure (est): 3 mm Hg. - Atrial septum: A  patent foramen ovale cannot be excluded based on  available images. - Tricuspid valve: There was trivial regurgitation. - Pericardium, extracardiac: There was no pericardial effusion.  Impressions:  - Normal LV wall thickness with LVEF [id number], grade 1 diastolic  dysfunction. Trivial mitral and tricuspid regurgitation. Cannot  exclude PFO based on available images. Unable to assess PASP.  Assessment and Plan  1. HTN - at goal, continue current meds   2. Hyperlipidemia - counseled on dietary and lifestyle modifications to improve triglycerides - continue statin.    EKG in clinic shows NSR    F/u 1 year     [name], M.D.

## 2016-01-25 NOTE — Patient Instructions (Signed)

## 2016-02-01 ENCOUNTER — Other Ambulatory Visit: Payer: Self-pay | Admitting: Family Medicine

## 2016-02-01 ENCOUNTER — Ambulatory Visit: Payer: PPO | Admitting: Cardiology

## 2016-02-08 DIAGNOSIS — R7303 Prediabetes: Secondary | ICD-10-CM | POA: Diagnosis not present

## 2016-02-08 DIAGNOSIS — E785 Hyperlipidemia, unspecified: Secondary | ICD-10-CM | POA: Diagnosis not present

## 2016-02-08 DIAGNOSIS — I10 Essential (primary) hypertension: Secondary | ICD-10-CM | POA: Diagnosis not present

## 2016-02-08 DIAGNOSIS — R7309 Other abnormal glucose: Secondary | ICD-10-CM | POA: Diagnosis not present

## 2016-02-08 LAB — HEMOGLOBIN A1C
HEMOGLOBIN A1C: 5.9 % — AB (ref <5.7)
MEAN PLASMA GLUCOSE: 123 mg/dL

## 2016-02-09 ENCOUNTER — Other Ambulatory Visit: Payer: Self-pay

## 2016-02-09 ENCOUNTER — Encounter: Payer: Self-pay | Admitting: Family Medicine

## 2016-02-09 ENCOUNTER — Ambulatory Visit (INDEPENDENT_AMBULATORY_CARE_PROVIDER_SITE_OTHER): Payer: PPO | Admitting: Family Medicine

## 2016-02-09 VITALS — BP 130/90 | HR 87 | Resp 16 | Ht 66.0 in | Wt 227.0 lb

## 2016-02-09 DIAGNOSIS — Z0000 Encounter for general adult medical examination without abnormal findings: Secondary | ICD-10-CM

## 2016-02-09 DIAGNOSIS — Z23 Encounter for immunization: Secondary | ICD-10-CM

## 2016-02-09 DIAGNOSIS — E785 Hyperlipidemia, unspecified: Secondary | ICD-10-CM

## 2016-02-09 DIAGNOSIS — R7303 Prediabetes: Secondary | ICD-10-CM

## 2016-02-09 DIAGNOSIS — Z7689 Persons encountering health services in other specified circumstances: Secondary | ICD-10-CM | POA: Insufficient documentation

## 2016-02-09 DIAGNOSIS — I10 Essential (primary) hypertension: Secondary | ICD-10-CM

## 2016-02-09 LAB — TSH: TSH: 0.83 mIU/L

## 2016-02-09 LAB — LIPID PANEL
Cholesterol: 192 mg/dL (ref 125–200)
HDL: 41 mg/dL — ABNORMAL LOW (ref >=46)
LDL Cholesterol: 88 mg/dL (ref <130)
Total CHOL/HDL Ratio: 4.7 Ratio (ref <=5.0)
Triglycerides: 316 mg/dL — ABNORMAL HIGH (ref <150)
VLDL: 63 mg/dL — AB (ref <30)

## 2016-02-09 LAB — COMPREHENSIVE METABOLIC PANEL
ALBUMIN: 4.4 g/dL (ref 3.6–5.1)
ALT: 17 U/L (ref 6–29)
AST: 18 U/L (ref 10–35)
Alkaline Phosphatase: 63 U/L (ref 33–130)
BUN: 11 mg/dL (ref 7–25)
CALCIUM: 9.6 mg/dL (ref 8.6–10.4)
CO2: 27 mmol/L (ref 20–31)
Chloride: 100 mmol/L (ref 98–110)
Creat: 0.92 mg/dL (ref 0.50–0.99)
GLUCOSE: 107 mg/dL — AB (ref 65–99)
POTASSIUM: 3.5 mmol/L (ref 3.5–5.3)
Sodium: 139 mmol/L (ref 135–146)
Total Bilirubin: 0.6 mg/dL (ref 0.2–1.2)
Total Protein: 7.1 g/dL (ref 6.1–8.1)

## 2016-02-09 MED ORDER — TRIAMTERENE-HCTZ 75-50 MG PO TABS: 1.0000 | ORAL_TABLET | Freq: Every day | ORAL | Status: DC

## 2016-02-09 MED ORDER — LOVASTATIN 20 MG PO TABS
20.0000 mg | ORAL_TABLET | Freq: Every day | ORAL | Status: DC
Start: 2016-02-09 — End: 2016-08-26

## 2016-02-09 MED ORDER — POTASSIUM CHLORIDE ER 10 MEQ PO TBCR: 10.0000 meq | EXTENDED_RELEASE_TABLET | Freq: Two times a day (BID) | ORAL | Status: DC

## 2016-02-09 MED ORDER — AZELASTINE HCL 0.1 % NA SOLN: 1.0000 | Freq: Two times a day (BID) | NASAL | Status: DC

## 2016-02-09 NOTE — Patient Instructions (Addendum)
Annual physical examfirst week in novembwer, call if you need me sooner  Improved labs, great, PLS cut back on fried foods and meat  Pls commit to 30 mins exercise eb=very day  Pneumonia vaccine today  Fasting labs end October  PLEASE use your calender to keep up with dates  Fall Prevention in the Home  Falls can cause injuries. They can happen to people of all ages. There are many things you can do to make your home safe and to help prevent falls.  WHAT CAN I DO ON THE OUTSIDE OF MY HOME?  Regularly fix the edges of walkways and driveways and fix any cracks.  Remove anything that might make you trip as you walk through a door, such as a raised step or threshold.  Trim any bushes or trees on the path to your home.  Use bright outdoor lighting.  Clear any walking paths of anything that might make someone trip, such as rocks or tools.  Regularly check to see if handrails are loose or broken. Make sure that both sides of any steps have handrails.  Any raised decks and porches should have guardrails on the edges.  Have any leaves, snow, or ice cleared regularly.  Use sand or salt on walking paths during winter.  Clean up any spills in your garage right away. This includes oil or grease spills. WHAT CAN I DO IN THE BATHROOM?   Use night lights.  Install grab bars by the toilet and in the tub and shower. Do not use towel bars as grab bars.  Use non-skid mats or decals in the tub or shower.  If you need to sit down in the shower, use a plastic, non-slip stool.  Keep the floor dry. Clean up any water that spills on the floor as soon as it happens.  Remove soap buildup in the tub or shower regularly.  Attach bath mats securely with double-sided non-slip rug tape.  Do not have throw rugs and other things on the floor that can make you trip. WHAT CAN I DO IN THE BEDROOM?  Use night lights.  Make sure that you have a light by your bed that is easy to reach.  Do not use  any sheets or blankets that are too big for your bed. They should not hang down onto the floor.  Have a firm chair that has side arms. You can use this for support while you get dressed.  Do not have throw rugs and other things on the floor that can make you trip. WHAT CAN I DO IN THE KITCHEN?  Clean up any spills right away.  Avoid walking on wet floors.  Keep items that you use a lot in easy-to-reach places.  If you need to reach something above you, use a strong step stool that has a grab bar.  Keep electrical cords out of the way.  Do not use floor polish or wax that makes floors slippery. If you must use wax, use non-skid floor wax.  Do not have throw rugs and other things on the floor that can make you trip. WHAT CAN I DO WITH MY STAIRS?  Do not leave any items on the stairs.  Make sure that there are handrails on both sides of the stairs and use them. Fix handrails that are broken or loose. Make sure that handrails are as long as the stairways.  Check any carpeting to make sure that it is firmly attached to the stairs. Fix any carpet  that is loose or worn.  Avoid having throw rugs at the top or bottom of the stairs. If you do have throw rugs, attach them to the floor with carpet tape.  Make sure that you have a light switch at the top of the stairs and the bottom of the stairs. If you do not have them, ask someone to add them for you. WHAT ELSE CAN I DO TO HELP PREVENT FALLS?  Wear shoes that:  Do not have high heels.  Have rubber bottoms.  Are comfortable and fit you well.  Are closed at the toe. Do not wear sandals.  If you use a stepladder:  Make sure that it is fully opened. Do not climb a closed stepladder.  Make sure that both sides of the stepladder are locked into place.  Ask someone to hold it for you, if possible.  Clearly mark and make sure that you can see:  Any grab bars or handrails.  First and last steps.  Where the edge of each step  is.  Use tools that help you move around (mobility aids) if they are needed. These include:  Canes.  Walkers.  Scooters.  Crutches.  Turn on the lights when you go into a dark area. Replace any light bulbs as soon as they burn out.  Set up your furniture so you have a clear path. Avoid moving your furniture around.  If any of your floors are uneven, fix them.  If there are any pets around you, be aware of where they are.  Review your medicines with your doctor. Some medicines can make you feel dizzy. This can increase your chance of falling. Ask your doctor what other things that you can do to help prevent falls.   This information is not intended to replace advice given to you by your health care provider. Make sure you discuss any questions you have with your health care provider.   Document Released: 05/28/2009 Document Revised: 12/16/2014 Document Reviewed: 09/05/2014 Elsevier Interactive Patient Education 2016 [place] Directive Advance directives are the legal documents that allow you to make choices about your health care and medical treatment if you cannot speak for yourself. Advance directives are a way for you to communicate your wishes to family, friends, and health care providers. The specified people can then convey your decisions about end-of-life care to avoid confusion if you should become unable to communicate. Ideally, the process of discussing and writing advance directives should happen over time rather than making decisions all at once. Advance directives can be modified as your situation changes, and you can change your mind at any time, even after you have signed the advance directives. Each state has its own laws regarding advance directives. You may want to check with your health care provider, attorney, or state representative about the law in your state. Below are some examples of advance directives. LIVING WILL A living will is a set of  instructions documenting your wishes about medical care when you cannot care for yourself. It is used if you become:  Terminally ill.  Incapacitated.  Unable to communicate.  Unable to make decisions. Items to consider in your living will include:  The use or non-use of life-sustaining equipment, such as dialysis machines and breathing machines (ventilators).  A do not resuscitate (DNR) order, which is the instruction not to use cardiopulmonary resuscitation (CPR) if breathing or heartbeat stops.  Tube feeding.  Withholding of food and fluids.  Comfort (palliative) care when  the goal becomes comfort rather than a cure.  Organ and tissue donation. A living will does not give instructions about distribution of your money and property if you should pass away. It is advisable to seek the expert advice of a lawyer in drawing up a will regarding your possessions. Decisions about taxes, beneficiaries, and asset distribution will be legally binding. This process can relieve your family and friends of any burdens surrounding disputes or questions that may come up about the allocation of your assets. DO NOT RESUSCITATE (DNR) A do not resuscitate (DNR) order is a request to not have CPR in the event that your heart stops beating or you stop breathing. Unless given other instructions, a health care provider will try to help any patient whose heart has stopped or who has stopped breathing.  HEALTH CARE PROXY AND DURABLE POWER OF ATTORNEY FOR HEALTH CARE A health care proxy is a person (agent) appointed to make medical decisions for you if you cannot. Generally, people choose someone they know well and trust to represent their preferences when they can no longer do so. You should be sure to ask this person for agreement to act as your agent. An agent may have to exercise judgment in the event of a medical decision for which your wishes are not known. The durable power of attorney for health care is the  legal document that names your health care proxy. Once written, it should be:  Signed.  Notarized.  Dated.  Copied.  Witnessed.  Incorporated into your medical record. You may also want to appoint someone to manage your financial affairs if you cannot. This is called a durable power of attorney for finances. It is a separate legal document from the durable power of attorney for health care. You may choose the same person or someone different from your health care proxy to act as your agent in financial matters.   This information is not intended to replace advice given to you by your health care provider. Make sure you discuss any questions you have with your health care provider.   Document Released: 11/08/2007 Document Revised: 08/06/2013 Document Reviewed: 12/19/2012 Elsevier Interactive Patient Education [employer].

## 2016-02-09 NOTE — Assessment & Plan Note (Signed)

## 2016-02-09 NOTE — Progress Notes (Signed)
Preventive Screening-Counseling & Management   Patient present here today for a Medicare annual wellness visit.   Current Problems (verified)   Medications Prior to Visit Allergies (verified)   PAST HISTORY  Family History (verified)   Social History married 12 years, 5 children, home maker   Risk Factors  Current exercise habits:  Walks 2 days a week for 30 minutes   Dietary issues discussed: heart healthy with lots of fruits and vegetables and limits carbs and red meat   Cardiac risk factors: mother has massive MI in her 40's   Depression Screen  (Note: if answer to either of the following is "Yes", a more complete depression screening is indicated)   Over the past two weeks, have you felt down, depressed or hopeless? No  Over the past two weeks, have you felt little interest or pleasure in doing things? No  Have you lost interest or pleasure in daily life? No  Do you often feel hopeless? No  Do you cry easily over simple problems? No   Activities of Daily Living  In your present state of health, do you have any difficulty performing the following activities?  Driving?: No Managing money?: No Feeding yourself?:No Getting from bed to chair?:No Climbing a flight of stairs?:No Preparing food and eating?:No Bathing or showering?:No Getting dressed?:No Getting to the toilet?:No Using the toilet?:No Moving around from place to place?: No  Fall Risk Assessment In the past year have you fallen or had a near fall?:No Are you currently taking any medications that make you dizzy?:No   Hearing Difficulties: No Do you often ask people to speak up or repeat themselves?:No Do you experience ringing or noises in your ears?:No Do you have difficulty understanding soft or whispered voices?:No  Cognitive Testing  Alert? Yes Normal Appearance?Yes  Oriented to person? Yes Place? Yes  Time? Yes  Displays appropriate judgment?Yes  Can read the correct time from a watch face?  yes Are you having problems remembering things?No  Advanced Directives have been discussed with the patient?Yes, no advanced directives. Will give brochure  , full code   List the Names of Other Physician/Practitioners you currently use: (updated)   Indicate any recent Medical Services you may have received from other than [facility] providers in the past year (date may be approximate).   Assessment:    Annual Wellness Exam   Plan:    .  Medicare Attestation  I have personally reviewed:  The patient's medical and social history  Their use of alcohol, tobacco or illicit drugs  Their current medications and supplements  The patient's functional ability including ADLs,fall risks, home safety risks, cognitive, and hearing and visual impairment  Diet and physical activities  Evidence for depression or mood disorders  The patient's weight, height, BMI, and visual acuity have been recorded in the chart. I have made referrals, counseling, and provided education to the patient based on review of the above and I have provided the patient with a written personalized care plan for preventive services.    Physical exam:    BP 130/90 mmHg  Pulse 87  Resp 16  Ht 5\' 6"  (1.676 m)  Wt 227 lb (102.967 kg)  BMI 36.66 kg/m2  SpO2 97%   Assessment/ Plan  Medicare annual wellness visit, subsequent Annual exam as documented. Counseling done  re healthy lifestyle involving commitment to 150 minutes exercise per week, heart healthy diet, and attaining healthy weight.The importance of adequate sleep also discussed. Regular seat belt use and home  safety, is also discussed. Changes in health habits are decided on by the patient with goals and time frames  set for achieving them. Immunization and cancer screening needs are specifically addressed at this visit.

## 2016-03-16 ENCOUNTER — Other Ambulatory Visit: Payer: Self-pay | Admitting: Family Medicine

## 2016-04-23 ENCOUNTER — Emergency Department (HOSPITAL_COMMUNITY): Payer: PPO

## 2016-04-23 ENCOUNTER — Encounter (HOSPITAL_COMMUNITY): Payer: Self-pay | Admitting: Emergency Medicine

## 2016-04-23 ENCOUNTER — Emergency Department (HOSPITAL_COMMUNITY)
Admission: EM | Admit: 2016-04-23 | Discharge: 2016-04-23 | Disposition: A | Discharge status: Home / Self Care | Payer: PPO | Attending: Emergency Medicine | Admitting: Emergency Medicine

## 2016-04-23 DIAGNOSIS — S40011A Contusion of right shoulder, initial encounter: Secondary | ICD-10-CM | POA: Insufficient documentation

## 2016-04-23 DIAGNOSIS — M25511 Pain in right shoulder: Secondary | ICD-10-CM | POA: Diagnosis not present

## 2016-04-23 DIAGNOSIS — I10 Essential (primary) hypertension: Secondary | ICD-10-CM | POA: Diagnosis not present

## 2016-04-23 DIAGNOSIS — Z7982 Long term (current) use of aspirin: Secondary | ICD-10-CM | POA: Diagnosis not present

## 2016-04-23 DIAGNOSIS — Y92410 Unspecified street and highway as the place of occurrence of the external cause: Secondary | ICD-10-CM | POA: Diagnosis not present

## 2016-04-23 DIAGNOSIS — Z87891 Personal history of nicotine dependence: Secondary | ICD-10-CM | POA: Diagnosis not present

## 2016-04-23 DIAGNOSIS — Z79899 Other long term (current) drug therapy: Secondary | ICD-10-CM | POA: Insufficient documentation

## 2016-04-23 DIAGNOSIS — S4991XA Unspecified injury of right shoulder and upper arm, initial encounter: Secondary | ICD-10-CM | POA: Diagnosis not present

## 2016-04-23 DIAGNOSIS — Y939 Activity, unspecified: Secondary | ICD-10-CM | POA: Diagnosis not present

## 2016-04-23 DIAGNOSIS — Y999 Unspecified external cause status: Secondary | ICD-10-CM | POA: Diagnosis not present

## 2016-04-23 MED ORDER — NAPROXEN 500 MG PO TABS: 500.0000 mg | ORAL_TABLET | Freq: Two times a day (BID) | ORAL | 0 refills | Status: DC
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
## 2016-04-28 ENCOUNTER — Encounter: Payer: Self-pay | Admitting: Family Medicine

## 2016-04-28 ENCOUNTER — Ambulatory Visit (INDEPENDENT_AMBULATORY_CARE_PROVIDER_SITE_OTHER): Payer: PPO

## 2016-04-28 ENCOUNTER — Ambulatory Visit (INDEPENDENT_AMBULATORY_CARE_PROVIDER_SITE_OTHER): Payer: PPO | Admitting: Family Medicine

## 2016-04-28 VITALS — BP 124/82 | HR 90 | Resp 16 | Ht 66.0 in | Wt 225.0 lb

## 2016-04-28 DIAGNOSIS — M79672 Pain in left foot: Secondary | ICD-10-CM | POA: Insufficient documentation

## 2016-04-28 DIAGNOSIS — Z23 Encounter for immunization: Secondary | ICD-10-CM

## 2016-04-28 DIAGNOSIS — I10 Essential (primary) hypertension: Secondary | ICD-10-CM | POA: Diagnosis not present

## 2016-04-28 MED ORDER — PREDNISONE 5 MG (21) PO TBPK
5.0000 mg | ORAL_TABLET | ORAL | 0 refills | Status: DC
Start: 2016-04-28 — End: 2016-08-10

## 2016-04-28 MED ORDER — METHYLPREDNISOLONE ACETATE 80 MG/ML IJ SUSP
80.0000 mg | Freq: Once | INTRAMUSCULAR | Status: AC
  Administered 2016-04-28: 80 mg via INTRAMUSCULAR

## 2016-04-28 NOTE — Assessment & Plan Note (Addendum)
Depo medrol today in office and to be followed by prednisone dose pack, 3 week history

## 2016-04-28 NOTE — Patient Instructions (Signed)
F/u as before call if you need me sooner.  Your left foot pain is likely from a heel spur or fascitis. Anti inflammatory will help and also work on weight loss  Prednisone sent in and depo medrol will be given in the office  Thanks for choosing [facility], we consider it a privelige to serve you.

## 2016-05-01 NOTE — Assessment & Plan Note (Signed)
Controlled, no change in medication DASH diet and commitment to daily physical activity for a minimum of 30 minutes discussed and encouraged, as a part of hypertension management. The importance of attaining a healthy weight is also discussed.  BP/Weight 04/28/2016 04/23/2016 02/09/2016 01/25/2016 11/11/2015 10/07/2015 [id number]  Systolic BP [id number]  Diastolic BP 82 81 90 84 80 80 84  Wt. (Lbs) 225 220 227 229.6 231 227 226.12  BMI 36.32 35.51 36.66 37.08 37.3 36.66 36.51

## 2016-05-01 NOTE — Progress Notes (Signed)
   JONNI IBACH     MRN: [id number]      DOB: May 14, 1951   HPI Ms. Garwood is here with a 3 week h/o left foot pain after using her heel to apply pressure to a hoe while gardening, pain is worst on early arising and eases off after she walks for a short while The PT denies any adverse reactions to current medications since the last visit.  There are no new concerns.  There are no specific complaints   ROS Denies recent fever or chills. Denies sinus pressure, nasal congestion, ear pain or sore throat. Denies chest congestion, productive cough or wheezing. Denies chest pains, palpitations and leg swelling  Denies headaches, seizures, numbness, or tingling. Denies depression, anxiety or insomnia. Denies skin break down or rash.   PE  BP 124/82   Pulse 90   Resp 16   Ht 5\' 6"  (1.676 m)   Wt 225 lb (102.1 kg)   SpO2 96%   BMI 36.32 kg/m   Patient alert and oriented and in no cardiopulmonary distress.  HEENT: No facial asymmetry, EOMI,   oropharynx pink and moist.  Neck supple no JVD, no mass.  Chest: Clear to auscultation bilaterally.  CVS: S1, S2 no murmurs, no S3.Regular rate.     Ext: No edema  MS: Adequate ROM spine, shoulders, hips and knees. Left foot tender on [palpation over heel and also instep, no erythema or warmth of affected areas Skin: Intact, no ulcerations or rash noted.  Psych: Good eye contact, normal affect. Memory intact not anxious or depressed appearing.  CNS: CN 2-12 intact, power,  normal throughout.no focal deficits noted.   Assessment & Plan  Foot pain, left Depo medrol today in office and to be followed by prednisone dose pack, 3 week history  Essential hypertension Controlled, no change in medication DASH diet and commitment to daily physical activity for a minimum of 30 minutes discussed and encouraged, as a part of hypertension management. The importance of attaining a healthy weight is also discussed.  BP/Weight 04/28/2016 04/23/2016  02/09/2016 01/25/2016 11/11/2015 10/07/2015 [id number]  Systolic BP [id number]  Diastolic BP 82 81 90 84 80 80 84  Wt. (Lbs) 225 220 227 229.6 231 227 226.12  BMI 36.32 35.51 36.66 37.08 37.3 36.66 36.51       Morbid obesity Deteriorated. Patient re-educated about  the importance of commitment to a  minimum of 150 minutes of exercise per week.  The importance of healthy food choices with portion control discussed. Encouraged to start a food diary, count calories and to consider  joining a support group. Sample diet sheets offered. Goals set by the patient for the next several months.   Weight /BMI 04/28/2016 04/23/2016 02/09/2016  WEIGHT 225 lb 220 lb 227 lb  HEIGHT 5\' 6"  5\' 6"  5\' 6"   BMI 36.32 kg/m2 35.51 kg/m2 36.66 kg/m2

## 2016-05-01 NOTE — Assessment & Plan Note (Signed)
Deteriorated. Patient re-educated about  the importance of commitment to a  minimum of 150 minutes of exercise per week.  The importance of healthy food choices with portion control discussed. Encouraged to start a food diary, count calories and to consider  joining a support group. Sample diet sheets offered. Goals set by the patient for the next several months.   Weight /BMI 04/28/2016 04/23/2016 02/09/2016  WEIGHT 225 lb 220 lb 227 lb  HEIGHT 5\' 6"  5\' 6"  5\' 6"   BMI 36.32 kg/m2 35.51 kg/m2 36.66 kg/m2

## 2016-05-09 ENCOUNTER — Telehealth: Payer: Self-pay | Admitting: Family Medicine

## 2016-05-09 NOTE — Telephone Encounter (Signed)
Sarah Coleman is asking for more prednisone for her foot, she states that it is still inflammed, please advise?

## 2016-05-09 NOTE — Telephone Encounter (Signed)
Refer to podiatry, please

## 2016-05-09 NOTE — Telephone Encounter (Signed)
Patient states that she continues to have heel pain.  Was treated last week in office with steroid injection and Prednisone Rx.  Please advise.

## 2016-05-09 NOTE — Telephone Encounter (Signed)
Called and left message for patient to return call in reference to referral.

## 2016-05-10 NOTE — Telephone Encounter (Signed)
Wants to try home remedy and will call back if she would like referral

## 2016-05-23 ENCOUNTER — Other Ambulatory Visit: Payer: Self-pay | Admitting: Family Medicine

## 2016-06-24 ENCOUNTER — Other Ambulatory Visit: Payer: Self-pay | Admitting: Family Medicine

## 2016-07-06 ENCOUNTER — Encounter: Payer: PPO | Admitting: Family Medicine

## 2016-07-06 ENCOUNTER — Other Ambulatory Visit: Payer: Self-pay | Admitting: Family Medicine

## 2016-07-11 ENCOUNTER — Other Ambulatory Visit: Payer: Self-pay | Admitting: Family Medicine

## 2016-07-11 DIAGNOSIS — Z1231 Encounter for screening mammogram for malignant neoplasm of breast: Secondary | ICD-10-CM

## 2016-07-27 DIAGNOSIS — I10 Essential (primary) hypertension: Secondary | ICD-10-CM | POA: Diagnosis not present

## 2016-07-27 DIAGNOSIS — E785 Hyperlipidemia, unspecified: Secondary | ICD-10-CM | POA: Diagnosis not present

## 2016-07-27 DIAGNOSIS — R7303 Prediabetes: Secondary | ICD-10-CM | POA: Diagnosis not present

## 2016-07-27 LAB — COMPREHENSIVE METABOLIC PANEL
ALT: 18 U/L (ref 6–29)
AST: 18 U/L (ref 10–35)
Albumin: 4.4 g/dL (ref 3.6–5.1)
Alkaline Phosphatase: 70 U/L (ref 33–130)
BUN: 10 mg/dL (ref 7–25)
CHLORIDE: 98 mmol/L (ref 98–110)
CO2: 25 mmol/L (ref 20–31)
CREATININE: 0.99 mg/dL (ref 0.50–0.99)
Calcium: 9.4 mg/dL (ref 8.6–10.4)
GLUCOSE: 103 mg/dL — AB (ref 65–99)
Potassium: 3.4 mmol/L — ABNORMAL LOW (ref 3.5–5.3)
SODIUM: 136 mmol/L (ref 135–146)
TOTAL PROTEIN: 6.9 g/dL (ref 6.1–8.1)
Total Bilirubin: 0.6 mg/dL (ref 0.2–1.2)

## 2016-07-27 LAB — LIPID PANEL
Cholesterol: 184 mg/dL (ref <200)
HDL: 37 mg/dL — AB (ref >50)
LDL CALC: 78 mg/dL (ref <100)
Total CHOL/HDL Ratio: 5 Ratio — ABNORMAL HIGH (ref <5.0)
Triglycerides: 346 mg/dL — ABNORMAL HIGH (ref <150)
VLDL: 69 mg/dL — ABNORMAL HIGH (ref <30)

## 2016-07-27 LAB — CBC
HCT: 37.7 % (ref 35.0–45.0)
Hemoglobin: 12.7 g/dL (ref 11.7–15.5)
MCH: 30.6 pg (ref 27.0–33.0)
MCHC: 33.7 g/dL (ref 32.0–36.0)
MCV: 90.8 fL (ref 80.0–100.0)
MPV: 9.5 fL (ref 7.5–12.5)
PLATELETS: 287 10*3/uL (ref 140–400)
RBC: 4.15 MIL/uL (ref 3.80–5.10)
RDW: 15.1 % — ABNORMAL HIGH (ref 11.0–15.0)
WBC: 5.9 10*3/uL (ref 3.8–10.8)

## 2016-07-28 LAB — HEMOGLOBIN A1C
HEMOGLOBIN A1C: 5.7 % — AB (ref <5.7)
Mean Plasma Glucose: 117 mg/dL

## 2016-08-01 ENCOUNTER — Ambulatory Visit (HOSPITAL_COMMUNITY)
Admission: RE | Admit: 2016-08-01 | Discharge: 2016-08-01 | Disposition: A | Discharge status: Home / Self Care | Payer: PPO | Source: Ambulatory Visit | Attending: Family Medicine | Admitting: Family Medicine

## 2016-08-01 DIAGNOSIS — Z1231 Encounter for screening mammogram for malignant neoplasm of breast: Secondary | ICD-10-CM | POA: Insufficient documentation

## 2016-08-10 ENCOUNTER — Encounter: Payer: Self-pay | Admitting: Family Medicine

## 2016-08-10 ENCOUNTER — Ambulatory Visit (INDEPENDENT_AMBULATORY_CARE_PROVIDER_SITE_OTHER): Payer: PPO | Admitting: Family Medicine

## 2016-08-10 VITALS — BP 120/84 | HR 91 | Resp 16 | Ht 66.0 in | Wt 227.0 lb

## 2016-08-10 DIAGNOSIS — Z0000 Encounter for general adult medical examination without abnormal findings: Secondary | ICD-10-CM

## 2016-08-10 DIAGNOSIS — R7303 Prediabetes: Secondary | ICD-10-CM

## 2016-08-10 DIAGNOSIS — E785 Hyperlipidemia, unspecified: Secondary | ICD-10-CM

## 2016-08-10 DIAGNOSIS — Z1211 Encounter for screening for malignant neoplasm of colon: Secondary | ICD-10-CM

## 2016-08-10 DIAGNOSIS — E559 Vitamin D deficiency, unspecified: Secondary | ICD-10-CM

## 2016-08-10 DIAGNOSIS — I10 Essential (primary) hypertension: Secondary | ICD-10-CM

## 2016-08-10 NOTE — Patient Instructions (Addendum)
F/u in 4.5 month, call if you need me sooner  You are referred for colonoscopy due in March  congrats on improved blood sugar  Fating lipid, cmp and hBA1C  and Vit D in 4.5 month  No changes in medication  Use netty pot flushes twice daily to help with sinus pressure  Please work on good  health habits so that your health will improve. 1. Commitment to daily physical activity for 30 to 60  minutes, if you are able to do this.  2. Commitment to wise food choices. Aim for half of your  food intake to be vegetable and fruit, one quarter starchy foods, and one quarter protein. Try to eat on a regular schedule  3 meals per day, snacking between meals should be limited to vegetables or fruits or small portions of nuts. 64 ounces of water per day is generally recommended, unless you have specific health conditions, like heart failure or kidney failure where you will need to limit fluid intake.  3. Commitment to sufficient and a  good quality of physical and mental rest daily, generally between 6 to 8 hours per day.  WITH PERSISTANCE AND PERSEVERANCE, THE IMPOSSIBLE , BECOMES THE NORM!

## 2016-08-11 NOTE — Assessment & Plan Note (Signed)

## 2016-08-11 NOTE — Progress Notes (Signed)
    Sarah Coleman     MRN: [id number]      DOB: 01/09/1951  HPI: Patient is in for annual physical exam. C/o increased sinus allergy symptoms, she is to continue current meds and add saline flushes Recent labs, if available are reviewed. Immunization is reviewed , and  updated if needed.   PE: BP 120/84   Pulse 91   Resp 16   Ht 5\' 6"  (1.676 m)   Wt 227 lb (103 kg)   SpO2 99%   BMI 36.64 kg/m   Pleasant  female, alert and oriented x 3, in no cardio-pulmonary distress. Afebrile. HEENT No facial trauma or asymetry. Sinuses non tender.  Extra occullar muscles intact,  External ears normal, tympanic membranes clear. Oropharynx moist, no exudate. Neck: supple, no adenopathy,JVD or thyromegaly.No bruits.  Chest: Clear to ascultation bilaterally.No crackles or wheezes. Non tender to palpation  Breast: No asymetry,no masses or lumps. No tenderness. No nipple discharge or inversion. No axillary or supraclavicular adenopathy  Cardiovascular system; Heart sounds normal,  S1 and  S2 ,no S3.  No murmur, or thrill. Apical beat not displaced Peripheral pulses normal.  Abdomen: Soft, non tender, no organomegaly or masses. No bruits. Bowel sounds normal. No guarding, tenderness or rebound.  Rectal:  Normal sphincter tone. No rectal mass. Guaiac negative stool.  GU: External genitalia normal female genitalia , normal female distribution of hair. No lesions. Urethral meatus normal in size, no  Prolapse, no lesions visibly  Present. Bladder non tender. Vagina pink and moist , with no visible lesions , discharge present . Adequate pelvic support no  cystocele or rectocele noted  Uterus absent, no adnexal masses, no  adnexal tenderness.   Musculoskeletal exam: Full ROM of spine, hips , shoulders and knees. No deformity ,swelling or crepitus noted. No muscle wasting or atrophy.   Neurologic: Cranial nerves 2 to 12 intact. Power, tone ,sensation and reflexes normal  throughout. No disturbance in gait. No tremor.  Skin: Intact, no ulceration, erythema , scaling or rash noted. Pigmentation normal throughout  Psych; Normal mood and affect. Judgement and concentration normal   Assessment & Plan:  Annual physical exam Annual exam as documented. Counseling done  re healthy lifestyle involving commitment to 150 minutes exercise per week, heart healthy diet, and attaining healthy weight.The importance of adequate sleep also discussed. Regular seat belt use and home safety, is also discussed. Changes in health habits are decided on by the patient with goals and time frames  set for achieving them. Immunization and cancer screening needs are specifically addressed at this visit.

## 2016-08-25 ENCOUNTER — Other Ambulatory Visit: Payer: Self-pay | Admitting: Family Medicine

## 2016-08-26 ENCOUNTER — Other Ambulatory Visit: Payer: Self-pay | Admitting: Family Medicine

## 2016-08-29 ENCOUNTER — Telehealth: Payer: Self-pay

## 2016-08-29 NOTE — Telephone Encounter (Signed)
Pt left a Vm she is ready to schedule the colonoscopy. I called and was told to call again another time.

## 2016-09-08 NOTE — Telephone Encounter (Signed)
LMOM to call.

## 2016-09-16 ENCOUNTER — Ambulatory Visit (INDEPENDENT_AMBULATORY_CARE_PROVIDER_SITE_OTHER): Payer: PPO | Admitting: Family Medicine

## 2016-09-16 ENCOUNTER — Encounter: Payer: Self-pay | Admitting: Family Medicine

## 2016-09-16 VITALS — BP 138/88 | HR 88 | Temp 98.5°F | Resp 18 | Ht 66.0 in | Wt 227.0 lb

## 2016-09-16 DIAGNOSIS — J0101 Acute recurrent maxillary sinusitis: Secondary | ICD-10-CM

## 2016-09-16 MED ORDER — LEVOFLOXACIN 500 MG PO TABS: 500.0000 mg | ORAL_TABLET | Freq: Every day | ORAL | 0 refills | Status: DC

## 2016-09-16 MED ORDER — PREDNISONE 20 MG PO TABS: 20.0000 mg | ORAL_TABLET | Freq: Two times a day (BID) | ORAL | 0 refills | Status: DC

## 2016-09-16 MED ORDER — FLUCONAZOLE 150 MG PO TABS: 150.0000 mg | ORAL_TABLET | Freq: Once | ORAL | 0 refills | Status: DC

## 2016-09-16 NOTE — Progress Notes (Signed)
Chief Complaint  Patient presents with  . Sinus Problem    x 6 days  patient says sinus infection for 6 days Started 2-3 days previous with a "cold" total 8-9 days, symptoms getting worse over time Has brown sinus drainage with streaks of blood Pressure in face and spells of vertigo Chills and fatigue but no fever (has not taken temp)  Patient Active Problem List   Diagnosis Date Noted  . Annual physical exam 06/04/2015  . Nonspecific abnormal electrocardiogram (ECG) (EKG) 12/22/2014  . Metabolic syndrome X [id number]  . Prediabetes 04/05/2013  . GERD (gastroesophageal reflux disease) 11/01/2012  . Hyperlipidemia LDL goal <100 06/09/2008  . Episodic recurrent vertigo 12/07/2007  . Morbid obesity ([place]) 12/06/2007  . Essential hypertension 12/06/2007  . Seasonal allergies 12/06/2007    Outpatient Encounter Prescriptions as of 09/16/2016  Medication Sig  . aspirin EC 81 MG tablet Take 1 tablet (81 mg total) by mouth daily.  Marland Kitchen azelastine (ASTELIN) 0.1 % nasal spray Place 1 spray into both nostrils 2 (two) times daily. Use in each nostril as directed  . CALCIUM-MAGNESIUM-ZINC PO Take 1 tablet by mouth 3 (three) times daily.  . cyclobenzaprine (FLEXERIL) 10 MG tablet TAKE ONE TABLET BY MOUTH AT BEDTIME  . estradiol (ESTRACE) 0.1 MG/GM vaginal cream Place 1 Applicatorful vaginally 2 (two) times a week.   . fluticasone (FLONASE) 50 MCG/ACT nasal spray USE TWO SPRAY(S) IN EACH NOSTRIL ONCE DAILY  . Iron-Vitamins (GERITOL PO) Take 1 tablet by mouth daily.  Marland Kitchen loratadine (CLARITIN) 10 MG tablet Take 1 tablet (10 mg total) by mouth daily.  Marland Kitchen lovastatin (MEVACOR) 20 MG tablet TAKE ONE TABLET BY MOUTH AT BEDTIME  . meclizine (ANTIVERT) 12.5 MG tablet TAKE ONE TABLET BY MOUTH THREE TIMES DAILY AS NEEDED FOR DIZZINESS  . naproxen (NAPROSYN) 500 MG tablet Take 1 tablet (500 mg total) by mouth 2 (two) times daily.  . Omega-3 Fatty Acids (OMEGA-3 FISH OIL PO) Take 4 tablets by mouth daily.  Omega xl  . pantoprazole (PROTONIX) 20 MG tablet TAKE ONE TABLET BY MOUTH ONCE DAILY  . potassium chloride (K-DUR) 10 MEQ tablet TAKE ONE TABLET BY MOUTH TWICE DAILY  . ranitidine (ZANTAC) 150 MG capsule Take 150 mg by mouth as needed for heartburn. Reported on 10/07/2015  . triamterene-hydrochlorothiazide (MAXZIDE) 75-50 MG tablet Take 1 tablet by mouth daily.  . fluconazole (DIFLUCAN) 150 MG tablet Take 1 tablet (150 mg total) by mouth once. Repeat in one week  . levofloxacin (LEVAQUIN) 500 MG tablet Take 1 tablet (500 mg total) by mouth daily.  . predniSONE (DELTASONE) 20 MG tablet Take 1 tablet (20 mg total) by mouth 2 (two) times daily with a meal.   No facility-administered encounter medications on file as of 09/16/2016.     Allergies  Allergen Reactions  . Ibuprofen   . Metformin And Related Dermatitis    Depigmentation of hands and wrists in 4 first 4 months of use, resembles vitiligo  . Penicillins     Review of Systems  Constitutional: Positive for appetite change, chills and fatigue. Negative for activity change and fever.  HENT: Positive for congestion, postnasal drip, rhinorrhea, sinus pain, sinus pressure and sore throat.   Eyes: Negative for redness and visual disturbance.  Respiratory: Negative for cough and shortness of breath.   Cardiovascular: Negative for chest pain and palpitations.  Gastrointestinal: Negative for constipation and diarrhea.  Genitourinary: Negative for difficulty urinating and frequency.  Musculoskeletal: Negative for arthralgias and myalgias.  Neurological: Positive for headaches. Negative for dizziness.  Psychiatric/Behavioral: Negative for sleep disturbance.      BP 138/88 (BP Location: Right Arm, Patient Position: Sitting, Cuff Size: Large)   Pulse 88   Temp 98.5 F (36.9 C) (Oral)   Resp 18   Ht 5\' 6"  (1.676 m)   Wt 227 lb (103 kg)   SpO2 99%   BMI 36.64 kg/m   Physical Exam  Constitutional: She is oriented to person, place, and  time. She appears well-developed and well-nourished.  HENT:  Head: Normocephalic and atraumatic.  Right Ear: External ear normal.  Left Ear: External ear normal.  Post pharynx red, nasal membranes swollen and red  Eyes: Conjunctivae are normal. Pupils are equal, round, and reactive to light.  Neck: Normal range of motion.  Cardiovascular: Normal rate, regular rhythm and normal heart sounds.   Pulmonary/Chest: Effort normal and breath sounds normal.  Abdominal: Soft. Bowel sounds are normal. There is no tenderness.  Musculoskeletal: Normal range of motion. She exhibits no edema.  Lymphadenopathy:    She has no cervical adenopathy.  Neurological: She is alert and oriented to person, place, and time.  Psychiatric: She has a normal mood and affect. Her behavior is normal.    ASSESSMENT/PLAN:  1. Acute recurrent maxillary sinusitis    Patient Instructions  Drink plenty of water continue your nasal sprays Take the antibiotic once a day Take the prednisone 2 x a day The diflucan is prescribed in case you need it Call for problems   Raylene Everts, MD

## 2016-09-16 NOTE — Telephone Encounter (Signed)
Letter mailed to pt to call.  

## 2016-09-16 NOTE — Patient Instructions (Signed)
Drink plenty of water continue your nasal sprays Take the antibiotic once a day Take the prednisone 2 x a day The diflucan is prescribed in case you need it Call for problems

## 2016-09-19 ENCOUNTER — Telehealth: Payer: Self-pay

## 2016-09-19 NOTE — Telephone Encounter (Signed)
Gastroenterology Pre-Procedure Review  Request Date: 09/19/2016 Requesting Physician: Dr. Moshe Cipro  PATIENT REVIEW QUESTIONS: The patient responded to the following health history questions as indicated:    1. Diabetes Melitis: no 2. Joint replacements in the past 12 months: no 3. Major health problems in the past 3 months: no 4. Has an artificial valve or MVP: no 5. Has a defibrillator: no 6. Has been advised in past to take antibiotics in advance of a procedure like teeth cleaning: no 7. Family history of colon cancer: no  8. Alcohol Use: no 9. History of sleep apnea: no  10. History of coronary artery or other vascular stents placed within the last 12 months: no    MEDICATIONS & ALLERGIES:    Patient reports the following regarding taking any blood thinners:   Plavix? no Aspirin? YES Coumadin? no Brilinta? no Xarelto? no Eliquis? no Pradaxa? no Savaysa? no Effient? no  Patient confirms/reports the following medications:  Current Outpatient Prescriptions  Medication Sig Dispense Refill  . aspirin EC 81 MG tablet Take 1 tablet (81 mg total) by mouth daily. 150 tablet 2  . azelastine (ASTELIN) 0.1 % nasal spray Place 1 spray into both nostrils 2 (two) times daily. Use in each nostril as directed 30 mL 12  . CALCIUM-MAGNESIUM-ZINC PO Take 1 tablet by mouth 3 (three) times daily.    Marland Kitchen estradiol (ESTRACE) 0.1 MG/GM vaginal cream Place 1 Applicatorful vaginally 2 (two) times a week.     . fluticasone (FLONASE) 50 MCG/ACT nasal spray USE TWO SPRAY(S) IN EACH NOSTRIL ONCE DAILY 48 g 1  . Iron-Vitamins (GERITOL PO) Take 1 tablet by mouth daily.    Marland Kitchen levofloxacin (LEVAQUIN) 500 MG tablet Take 1 tablet (500 mg total) by mouth daily. 5 tablet 0  . loratadine (CLARITIN) 10 MG tablet Take 1 tablet (10 mg total) by mouth daily. 30 tablet 11  . lovastatin (MEVACOR) 20 MG tablet TAKE ONE TABLET BY MOUTH AT BEDTIME 90 tablet 0  . meclizine (ANTIVERT) 12.5 MG tablet TAKE ONE TABLET BY MOUTH  THREE TIMES DAILY AS NEEDED FOR DIZZINESS 90 tablet 2  . naproxen (NAPROSYN) 500 MG tablet Take 1 tablet (500 mg total) by mouth 2 (two) times daily. 20 tablet 0  . Omega-3 Fatty Acids (OMEGA-3 FISH OIL PO) Take 4 tablets by mouth daily. Omega xl    . pantoprazole (PROTONIX) 20 MG tablet TAKE ONE TABLET BY MOUTH ONCE DAILY 90 tablet 1  . potassium chloride (K-DUR) 10 MEQ tablet TAKE ONE TABLET BY MOUTH TWICE DAILY 180 tablet 1  . predniSONE (DELTASONE) 20 MG tablet Take 1 tablet (20 mg total) by mouth 2 (two) times daily with a meal. 10 tablet 0  . ranitidine (ZANTAC) 150 MG capsule Take 150 mg by mouth as needed for heartburn. Reported on 10/07/2015    . triamterene-hydrochlorothiazide (MAXZIDE) 75-50 MG tablet Take 1 tablet by mouth daily. 90 tablet 1  . cyclobenzaprine (FLEXERIL) 10 MG tablet TAKE ONE TABLET BY MOUTH AT BEDTIME 30 tablet 3   No current facility-administered medications for this visit.     Patient confirms/reports the following allergies:  Allergies  Allergen Reactions  . Ibuprofen   . Metformin And Related Dermatitis    Depigmentation of hands and wrists in 4 first 4 months of use, resembles vitiligo  . Penicillins     No orders of the defined types were placed in this encounter.   AUTHORIZATION INFORMATION Primary Insurance:   ID #:  Group #:  Pre-Cert /  Auth required: Pre-Cert / Auth #:   Secondary Insurance:   ID #:   Group #:  Pre-Cert / Auth required:  Pre-Cert / Auth #:   SCHEDULE INFORMATION: Procedure has been scheduled as follows:  Date: 10/07/2016              Time:9:30 AM   Location: [facility] Short Stay This Gastroenterology Pre-Precedure Review Form is being routed to the following provider(s): Barney Drain, MD

## 2016-09-21 ENCOUNTER — Ambulatory Visit (INDEPENDENT_AMBULATORY_CARE_PROVIDER_SITE_OTHER): Payer: PPO

## 2016-09-21 VITALS — BP 110/80 | HR 80 | Temp 98.8°F | Ht 66.0 in | Wt 227.0 lb

## 2016-09-21 DIAGNOSIS — Z0000 Encounter for general adult medical examination without abnormal findings: Secondary | ICD-10-CM

## 2016-09-21 NOTE — Patient Instructions (Addendum)
Health maintenance: Up to date   Abnormal screenings: None   Patient concerns: None   Nurse concerns: BMI, recommend exercise program at lease 3 times a week for at least 30 minutes at a time.   Next PCP appt: Follow up with Dr. Moshe Cipro on 12/14/2016 at 8:20 am. Return in 1 year for your annual wellness visit.      Health Maintenance, Female Introduction Adopting a healthy lifestyle and getting preventive care can go a long way to promote health and wellness. Talk with your health care provider about what schedule of regular examinations is right for you. This is a good chance for you to check in with your provider about disease prevention and staying healthy. In between checkups, there are plenty of things you can do on your own. Experts have done a lot of research about which lifestyle changes and preventive measures are most likely to keep you healthy. Ask your health care provider for more information. Weight and diet Eat a healthy diet  Be sure to include plenty of vegetables, fruits, low-fat dairy products, and lean protein.  Do not eat a lot of foods high in solid fats, added sugars, or salt.  Get regular exercise. This is one of the most important things you can do for your health.  Most adults should exercise for at least 150 minutes each week. The exercise should increase your heart rate and make you sweat (moderate-intensity exercise).  Most adults should also do strengthening exercises at least twice a week. This is in addition to the moderate-intensity exercise. Maintain a healthy weight  Body mass index (BMI) is a measurement that can be used to identify possible weight problems. It estimates body fat based on height and weight. Your health care provider can help determine your BMI and help you achieve or maintain a healthy weight.  For females 1 years of age and older:  A BMI below 18.5 is considered underweight.  A BMI of 18.5 to 24.9 is normal.  A BMI of 25  to 29.9 is considered overweight.  A BMI of 30 and above is considered obese. Watch levels of cholesterol and blood lipids  You should start having your blood tested for lipids and cholesterol at 67 years of age, then have this test every 5 years.  You may need to have your cholesterol levels checked more often if:  Your lipid or cholesterol levels are high.  You are older than 66 years of age.  You are at high risk for heart disease. Cancer screening Lung Cancer  Lung cancer screening is recommended for adults 14-85 years old who are at high risk for lung cancer because of a history of smoking.  A yearly low-dose CT scan of the lungs is recommended for people who:  Currently smoke.  Have quit within the past 15 years.  Have at least a 30-pack-year history of smoking. A pack year is smoking an average of one pack of cigarettes a day for 1 year.  Yearly screening should continue until it has been 15 years since you quit.  Yearly screening should stop if you develop a health problem that would prevent you from having lung cancer treatment. Breast Cancer  Practice breast self-awareness. This means understanding how your breasts normally appear and feel.  It also means doing regular breast self-exams. Let your health care provider know about any changes, no matter how small.  If you are in your 20s or 30s, you should have a clinical breast exam (  CBE) by a health care provider every 1-3 years as part of a regular health exam.  If you are 33 or older, have a CBE every year. Also consider having a breast X-ray (mammogram) every year.  If you have a family history of breast cancer, talk to your health care provider about genetic screening.  If you are at high risk for breast cancer, talk to your health care provider about having an MRI and a mammogram every year.  Breast cancer gene (BRCA) assessment is recommended for women who have family members with BRCA-related cancers.  BRCA-related cancers include:  Breast.  Ovarian.  Tubal.  Peritoneal cancers.  Results of the assessment will determine the need for genetic counseling and BRCA1 and BRCA2 testing. Colorectal Cancer  This type of cancer can be detected and often prevented.  Routine colorectal cancer screening usually begins at 67 years of age and continues through 67 years of age.  Your health care provider may recommend screening at an earlier age if you have risk factors for colon cancer.  Your health care provider may also recommend using home test kits to check for hidden blood in the stool.  A small camera at the end of a tube can be used to examine your colon directly (sigmoidoscopy or colonoscopy). This is done to check for the earliest forms of colorectal cancer.  Routine screening usually begins at age 8.  Direct examination of the colon should be repeated every 5-10 years through 67 years of age. However, you may need to be screened more often if early forms of precancerous polyps or small growths are found. Skin Cancer  Check your skin from head to toe regularly.  Tell your health care provider about any new moles or changes in moles, especially if there is a change in a mole's shape or color.  Also tell your health care provider if you have a mole that is larger than the size of a pencil eraser.  Always use sunscreen. Apply sunscreen liberally and repeatedly throughout the day.  Protect yourself by wearing long sleeves, pants, a wide-brimmed hat, and sunglasses whenever you are outside. Heart disease, diabetes, and high blood pressure  High blood pressure causes heart disease and increases the risk of stroke. High blood pressure is more likely to develop in:  People who have blood pressure in the high end of the normal range (130-139/85-89 mm Hg).  People who are overweight or obese.  People who are African American.  If you are 10-63 years of age, have your blood pressure  checked every 3-5 years. If you are 63 years of age or older, have your blood pressure checked every year. You should have your blood pressure measured twice-once when you are at a hospital or clinic, and once when you are not at a hospital or clinic. Record the average of the two measurements. To check your blood pressure when you are not at a hospital or clinic, you can use:  An automated blood pressure machine at a pharmacy.  A home blood pressure monitor.  If you are between 42 years and 65 years old, ask your health care provider if you should take aspirin to prevent strokes.  Have regular diabetes screenings. This involves taking a blood sample to check your fasting blood sugar level.  If you are at a normal weight and have a low risk for diabetes, have this test once every three years after 67 years of age.  If you are overweight and have  a high risk for diabetes, consider being tested at a younger age or more often. Preventing infection Hepatitis B  If you have a higher risk for hepatitis B, you should be screened for this virus. You are considered at high risk for hepatitis B if:  You were born in a country where hepatitis B is common. Ask your health care provider which countries are considered high risk.  Your parents were born in a high-risk country, and you have not been immunized against hepatitis B (hepatitis B vaccine).  You have HIV or AIDS.  You use needles to inject street drugs.  You live with someone who has hepatitis B.  You have had sex with someone who has hepatitis B.  You get hemodialysis treatment.  You take certain medicines for conditions, including cancer, organ transplantation, and autoimmune conditions. Hepatitis C  Blood testing is recommended for:  Everyone born from [birth year] through 1966.  Anyone with known risk factors for hepatitis C. Osteoporosis and menopause  Osteoporosis is a disease in which the bones lose minerals and strength with  aging. This can result in serious bone fractures. Your risk for osteoporosis can be identified using a bone density scan.  If you are 65 years of age or older, or if you are at risk for osteoporosis and fractures, ask your health care provider if you should be screened.  Ask your health care provider whether you should take a calcium or vitamin D supplement to lower your risk for osteoporosis.  Menopause may have certain physical symptoms and risks.  Hormone replacement therapy may reduce some of these symptoms and risks. Talk to your health care provider about whether hormone replacement therapy is right for you. Follow these instructions at home:  Schedule regular health, dental, and eye exams.  Stay current with your immunizations.  Do not use any tobacco products including cigarettes, chewing tobacco, or electronic cigarettes.  Limit alcohol intake to no more than 1 drink per day for nonpregnant women. One drink equals 12 ounces of beer, 5 ounces of wine, or 1 ounces of hard liquor.  Ask your health care provider for help if you need support or information about quitting drugs.  Tell your health care provider if you often feel depressed.  Tell your health care provider if you have ever been abused or do not feel safe at home. This information is not intended to replace advice given to you by your health care provider. Make sure you discuss any questions you have with your health care provider. Document Released: 02/14/2011 Document Revised: 01/07/2016 Document Reviewed: 05/05/2015  2017 [employer]vier

## 2016-09-21 NOTE — Telephone Encounter (Signed)
MOVI PREP SPLIT DOSING, REGULAR BREAKFAST. CLEAR LIQUIDS AFTER 9 AM.  

## 2016-09-21 NOTE — Progress Notes (Signed)
Subjective:   Sarah Coleman is a 66 y.o. female who presents for Medicare Annual (Subsequent) preventive examination.    Review of Systems:  Cardiac Risk Factors include: advanced age (>46men, >18 women);dyslipidemia;hypertension;sedentary lifestyle;obesity (BMI >30kg/m2);family history of premature cardiovascular disease     Objective:     Vitals: BP 110/80   Pulse 80   Temp 98.8 F (37.1 C) (Oral)   Ht 5\' 6"  (1.676 m)   Wt 227 lb 0.6 oz (103 kg)   SpO2 98%   BMI 36.65 kg/m   Body mass index is 36.65 kg/m.   Tobacco History  Smoking Status  . Former Smoker  . Packs/day: 1.00  . Years: 3.00  . Quit date: 08/15/1974  Smokeless Tobacco  . Never Used     Counseling given: Not Answered   Past Medical History:  Diagnosis Date  . Allergic rhinitis   . Allergy   . GERD (gastroesophageal reflux disease) 2013  . Hyperlipidemia   . Hypertension   . Metabolic syndrome X [id number]  . Morbid obesity ([place]) 2000  . Obesity   . Prediabetes 2010   Past Surgical History:  Procedure Laterality Date  . PARTIAL HYSTERECTOMY  1987   secondary to ovarian cyst   . TUBAL LIGATION  1975   Family History  Problem Relation Age of Onset  . Heart attack Mother   . Heart disease Mother 51    massive heart attack  . Prostate cancer Father   . Cancer Father 68    colon  . Hypertension Sister   . Ovarian cancer Sister   . Cancer Brother 44    oral   . Hypertension Brother   . Heart disease Brother    History  Sexual Activity  . Sexual activity: Yes    Outpatient Encounter Prescriptions as of 09/21/2016  Medication Sig  . aspirin EC 81 MG tablet Take 1 tablet (81 mg total) by mouth daily.  Marland Kitchen azelastine (ASTELIN) 0.1 % nasal spray Place 1 spray into both nostrils 2 (two) times daily. Use in each nostril as directed  . CALCIUM-MAGNESIUM-ZINC PO Take 1 tablet by mouth 3 (three) times daily.  . cyclobenzaprine (FLEXERIL) 10 MG tablet TAKE ONE TABLET BY MOUTH AT BEDTIME (Patient  taking differently: TAKE ONE TABLET BY MOUTH AT BEDTIME AS NEEDED)  . estradiol (ESTRACE) 0.1 MG/GM vaginal cream Place 1 Applicatorful vaginally as needed.   . fluticasone (FLONASE) 50 MCG/ACT nasal spray USE TWO SPRAY(S) IN EACH NOSTRIL ONCE DAILY  . Iron-Vitamins (GERITOL PO) Take 1 tablet by mouth daily.  Marland Kitchen loratadine (CLARITIN) 10 MG tablet Take 1 tablet (10 mg total) by mouth daily.  Marland Kitchen lovastatin (MEVACOR) 20 MG tablet TAKE ONE TABLET BY MOUTH AT BEDTIME  . meclizine (ANTIVERT) 12.5 MG tablet TAKE ONE TABLET BY MOUTH THREE TIMES DAILY AS NEEDED FOR DIZZINESS  . naproxen (NAPROSYN) 500 MG tablet Take 1 tablet (500 mg total) by mouth 2 (two) times daily. (Patient taking differently: Take 500 mg by mouth 2 (two) times daily as needed. )  . Omega-3 Fatty Acids (OMEGA-3 FISH OIL PO) Take 4 tablets by mouth daily. Omega xl  . pantoprazole (PROTONIX) 20 MG tablet TAKE ONE TABLET BY MOUTH ONCE DAILY  . potassium chloride (K-DUR) 10 MEQ tablet TAKE ONE TABLET BY MOUTH TWICE DAILY  . ranitidine (ZANTAC) 150 MG capsule Take 150 mg by mouth as needed for heartburn. Reported on 10/07/2015  . triamterene-hydrochlorothiazide (MAXZIDE) 75-50 MG tablet Take 1 tablet by mouth  daily.  . [DISCONTINUED] levofloxacin (LEVAQUIN) 500 MG tablet Take 1 tablet (500 mg total) by mouth daily.  . [DISCONTINUED] predniSONE (DELTASONE) 20 MG tablet Take 1 tablet (20 mg total) by mouth 2 (two) times daily with a meal.   No facility-administered encounter medications on file as of 09/21/2016.     Activities of Daily Living In your present state of health, do you have any difficulty performing the following activities: 09/21/2016 08/10/2016  Hearing? N N  Vision? N N  Difficulty concentrating or making decisions? N N  Walking or climbing stairs? N N  Dressing or bathing? N N  Doing errands, shopping? N N  Preparing Food and eating ? N -  Using the Toilet? N -  In the past six months, have you accidently leaked urine? N -   Do you have problems with loss of bowel control? N -  Managing your Medications? N -  Managing your Finances? N -  Housekeeping or managing your Housekeeping? N -  Some recent data might be hidden    Patient Care Team: Fayrene Helper, MD as PCP - General Franchot Gallo, MD as Consulting Physician (Urology) [name], MD as Consulting Physician (Cardiology)    Assessment:    Exercise Activities and Dietary recommendations Current Exercise Habits: The patient does not participate in regular exercise at present  Goals    . Exercise 3x per week (30 min per time)          Recommend starting an exercise program 3 times a week at least 30 minutes at a time.    . Increase water intake      Fall Risk Fall Risk  09/21/2016 09/16/2016 02/09/2016 12/22/2014  Falls in the past year? No No No No   Depression Screen PHQ 2/9 Scores 09/21/2016 09/16/2016 02/09/2016 12/22/2014  PHQ - 2 Score 0 0 0 0  PHQ- 9 Score - - 1 -     Cognitive Function Normal 6CIT Screen 09/21/2016  What Year? 0 points  What month? 0 points  What time? 0 points  Count back from 20 0 points  Months in reverse 0 points  Repeat phrase 0 points  Total Score 0    Immunization History  Administered Date(s) Administered  . Influenza Whole 05/08/2007  . Influenza,inj,Quad PF,36+ Mos 04/23/2013, 07/28/2014, 05/14/2015, 04/28/2016  . Pneumococcal Conjugate-13 03/26/2014  . Pneumococcal Polysaccharide-23 02/09/2016  . Td 05/09/2006  . Zoster 11/01/2012   Screening Tests Health Maintenance  Topic Date Due  . TETANUS/TDAP  11/01/2018 (Originally 05/09/2016)  . COLONOSCOPY  10/31/2016  . MAMMOGRAM  08/01/2018  . INFLUENZA VACCINE  Completed  . DEXA SCAN  Completed  . ZOSTAVAX  Completed  . Hepatitis C Screening  Completed  . PNA vac Low Risk Adult  Completed      Plan:    I have personally reviewed and addressed the Medicare Annual Wellness questionnaire and have noted the following in the patient's  chart:  A. Medical and social history B. Use of alcohol, tobacco or illicit drugs  C. Current medications and supplements D. Functional ability and status E.  Nutritional status F.  Physical activity G. Advance directives H. List of other physicians I.  Hospitalizations, surgeries, and ER visits in previous 12 months J.  [place] to include cognitive, depression, and falls L. Referrals and appointments - Follow up in 1 year for your annual wellness visit  In addition, I have reviewed and discussed with patient certain preventive protocols,  quality metrics, and best practice recommendations. A written personalized care plan for preventive services as well as general preventive health recommendations were provided to patient.  Signed,   Stormy Fabian, LPN Lead Nurse Health Advisor

## 2016-09-22 ENCOUNTER — Telehealth: Payer: Self-pay

## 2016-09-22 NOTE — Telephone Encounter (Signed)
I recommend simply saline sinus  Flushes 2 to 3 times daily, takes time for congestion to fully clear, she is not febrile Needs to be usi Will review record to ensure this is accurate info

## 2016-09-23 NOTE — Telephone Encounter (Signed)
Patient aware of results.

## 2016-09-28 ENCOUNTER — Other Ambulatory Visit: Payer: Self-pay

## 2016-09-28 DIAGNOSIS — Z800 Family history of malignant neoplasm of digestive organs: Secondary | ICD-10-CM

## 2016-09-28 MED ORDER — PEG-KCL-NACL-NASULF-NA ASC-C 100 G PO SOLR: 1.0000 | ORAL | 0 refills | Status: DC

## 2016-09-28 NOTE — Telephone Encounter (Signed)
Rx sent to the pharmacy and instructions mailed to pt.   Dr. Oneida Alar, please note: this pt does have a family hx of colon cancer in her dad who died at age 67.

## 2016-09-28 NOTE — Telephone Encounter (Signed)
REVIEWED-NO ADDITIONAL RECOMMENDATIONS. 

## 2016-10-03 ENCOUNTER — Other Ambulatory Visit: Payer: Self-pay

## 2016-10-03 ENCOUNTER — Telehealth: Payer: Self-pay

## 2016-10-03 MED ORDER — LEVOFLOXACIN 500 MG PO TABS: 500.0000 mg | ORAL_TABLET | Freq: Every day | ORAL | 0 refills | Status: DC

## 2016-10-03 NOTE — Telephone Encounter (Signed)
Pt has not received the prep instructions. She is going to check on the Movie prep at the pharmacy and if it is too expensive, she will call me and have me send in the Trilyte prep.

## 2016-10-04 MED ORDER — PEG 3350-KCL-NA BICARB-NACL 420 G PO SOLR: 4000.0000 mL | ORAL | 0 refills | Status: DC

## 2016-10-04 NOTE — Telephone Encounter (Signed)
LMOM for pt that it has been sent in.

## 2016-10-04 NOTE — Telephone Encounter (Signed)
Pt said Movie prep too expensive, sending in Trilyte prep and faxing the info to [facility] for her.

## 2016-10-05 NOTE — Telephone Encounter (Signed)
Sarah Coleman in [place] called to inform us that they don't have the TRILYTE and will not be able to get in for her in time. Please advise

## 2016-10-05 NOTE — Telephone Encounter (Signed)
I called pt and told her I will give her a sample of the Movie Prep and I have printed out the instructions for her and left at front desk. She will pick up this afternoon.

## 2016-10-07 ENCOUNTER — Encounter (HOSPITAL_COMMUNITY)
Admission: RE | Disposition: A | Discharge status: Home / Self Care | Payer: Self-pay | Source: Ambulatory Visit | Attending: Gastroenterology

## 2016-10-07 ENCOUNTER — Ambulatory Visit (HOSPITAL_COMMUNITY)
Admission: RE | Admit: 2016-10-07 | Discharge: 2016-10-07 | Disposition: A | Discharge status: Home / Self Care | Payer: PPO | Source: Ambulatory Visit | Attending: Gastroenterology | Admitting: Gastroenterology

## 2016-10-07 ENCOUNTER — Telehealth: Payer: Self-pay | Admitting: Gastroenterology

## 2016-10-07 ENCOUNTER — Encounter (HOSPITAL_COMMUNITY): Payer: Self-pay | Admitting: *Deleted

## 2016-10-07 DIAGNOSIS — Z1211 Encounter for screening for malignant neoplasm of colon: Secondary | ICD-10-CM

## 2016-10-07 DIAGNOSIS — E8881 Metabolic syndrome: Secondary | ICD-10-CM | POA: Insufficient documentation

## 2016-10-07 DIAGNOSIS — K5730 Diverticulosis of large intestine without perforation or abscess without bleeding: Secondary | ICD-10-CM | POA: Insufficient documentation

## 2016-10-07 DIAGNOSIS — J309 Allergic rhinitis, unspecified: Secondary | ICD-10-CM | POA: Insufficient documentation

## 2016-10-07 DIAGNOSIS — I10 Essential (primary) hypertension: Secondary | ICD-10-CM | POA: Diagnosis not present

## 2016-10-07 DIAGNOSIS — Z886 Allergy status to analgesic agent status: Secondary | ICD-10-CM | POA: Insufficient documentation

## 2016-10-07 DIAGNOSIS — Z87891 Personal history of nicotine dependence: Secondary | ICD-10-CM | POA: Diagnosis not present

## 2016-10-07 DIAGNOSIS — Z79890 Hormone replacement therapy (postmenopausal): Secondary | ICD-10-CM | POA: Diagnosis not present

## 2016-10-07 DIAGNOSIS — K648 Other hemorrhoids: Secondary | ICD-10-CM | POA: Insufficient documentation

## 2016-10-07 DIAGNOSIS — K6389 Other specified diseases of intestine: Secondary | ICD-10-CM | POA: Insufficient documentation

## 2016-10-07 DIAGNOSIS — Z880 Allergy status to penicillin: Secondary | ICD-10-CM | POA: Insufficient documentation

## 2016-10-07 DIAGNOSIS — K219 Gastro-esophageal reflux disease without esophagitis: Secondary | ICD-10-CM | POA: Insufficient documentation

## 2016-10-07 DIAGNOSIS — Z1212 Encounter for screening for malignant neoplasm of rectum: Secondary | ICD-10-CM | POA: Diagnosis not present

## 2016-10-07 DIAGNOSIS — Z800 Family history of malignant neoplasm of digestive organs: Secondary | ICD-10-CM

## 2016-10-07 DIAGNOSIS — Z79899 Other long term (current) drug therapy: Secondary | ICD-10-CM | POA: Insufficient documentation

## 2016-10-07 DIAGNOSIS — Z888 Allergy status to other drugs, medicaments and biological substances status: Secondary | ICD-10-CM | POA: Diagnosis not present

## 2016-10-07 DIAGNOSIS — Z7982 Long term (current) use of aspirin: Secondary | ICD-10-CM | POA: Insufficient documentation

## 2016-10-07 DIAGNOSIS — Z6836 Body mass index (BMI) 36.0-36.9, adult: Secondary | ICD-10-CM | POA: Insufficient documentation

## 2016-10-07 DIAGNOSIS — E785 Hyperlipidemia, unspecified: Secondary | ICD-10-CM | POA: Insufficient documentation

## 2016-10-07 HISTORY — PX: COLONOSCOPY: SHX5424

## 2016-10-07 SURGERY — COLONOSCOPY
Anesthesia: Moderate Sedation

## 2016-10-07 MED ORDER — MEPERIDINE HCL 100 MG/ML IJ SOLN
INTRAMUSCULAR | Status: DC | PRN
  Administered 2016-10-07 (×3): 25 mg via INTRAVENOUS

## 2016-10-07 MED ORDER — MIDAZOLAM HCL 5 MG/5ML IJ SOLN
INTRAMUSCULAR | Status: AC
  Filled 2016-10-07: qty 10

## 2016-10-07 MED ORDER — SIMETHICONE 40 MG/0.6ML PO SUSP
ORAL | Status: DC | PRN
  Administered 2016-10-07: 2.5 mL

## 2016-10-07 MED ORDER — MEPERIDINE HCL 100 MG/ML IJ SOLN
INTRAMUSCULAR | Status: AC
  Filled 2016-10-07: qty 2

## 2016-10-07 MED ORDER — MIDAZOLAM HCL 5 MG/5ML IJ SOLN
INTRAMUSCULAR | Status: DC | PRN
  Administered 2016-10-07: 1 mg via INTRAVENOUS
  Administered 2016-10-07 (×2): 2 mg via INTRAVENOUS

## 2016-10-07 MED ORDER — SODIUM CHLORIDE 0.9 % IV SOLN
INTRAVENOUS | Status: DC
  Administered 2016-10-07: 1000 mL via INTRAVENOUS

## 2016-10-07 NOTE — Discharge Instructions (Signed)
I COULD NOT COMPLETE YOUR COLONOSCOPY BECAUSE YOU WERE AGITATED IN SPITE OF ADEQUATE SEDATION AND ASKED ME TO STOP. You DID NOT HAVE ANY POLYPS. YOU HAVE DIVERTICULOSIS IN YOUR LEFT COLON. You have MODERATE internal hemorrhoids.   CONTINUE YOUR WEIGHT LOSS EFFORTS.  WHILE I DO NOT WANT TO ALARM YOU, YOUR BODY MASS INDEX IS OVER 30 WHICH MEANS YOU ARE OBESE. OBESITY drives cancer genes and IS ASSOCIATED WITH AN INCREASE RISK FOR ALL CANCERS, INCLUDING ESOPHAGEAL AND COLON CANCER.  SEE INSTRUCTIONS BELOW TO MANAGE YOUR CONSTIPATION.  DRINK WATER TO KEEP URINE LIGHT YELLOW.  FOLLOW A HIGH FIBER DIET. AVOID ITEMS THAT CAUSE BLOATING & GAS. SEE INFO BELOW.  Next colonoscopy in 1-5 years with propofol.   Colonoscopy Care After Read the instructions outlined below and refer to this sheet in the next week. These discharge instructions provide you with general information on caring for yourself after you leave the hospital. While your treatment has been planned according to the most current medical practices available, unavoidable complications occasionally occur. If you have any problems or questions after discharge, call DR. Chelsia Serres, [phone].  ACTIVITY  You may resume your regular activity, but move at a slower pace for the next 24 hours.   Take frequent rest periods for the next 24 hours.   Walking will help get rid of the air and reduce the bloated feeling in your belly (abdomen).   No driving for 24 hours (because of the medicine (anesthesia) used during the test).   You may shower.   Do not sign any important legal documents or operate any machinery for 24 hours (because of the anesthesia used during the test).    NUTRITION  Drink plenty of fluids.   You may resume your normal diet as instructed by your doctor.   Begin with a light meal and progress to your normal diet. Heavy or fried foods are harder to digest and may make you feel sick to your stomach (nauseated).   Avoid  alcoholic beverages for 24 hours or as instructed.    MEDICATIONS  You may resume your normal medications.   WHAT YOU CAN EXPECT TODAY  Some feelings of bloating in the abdomen.   Passage of more gas than usual.   Spotting of blood in your stool or on the toilet paper  .  IF YOU HAD POLYPS REMOVED DURING THE COLONOSCOPY:  Eat a soft diet IF YOU HAVE NAUSEA, BLOATING, ABDOMINAL PAIN, OR VOMITING.    FINDING OUT THE RESULTS OF YOUR TEST Not all test results are available during your visit. DR. Oneida Alar WILL CALL YOU WITHIN 7 DAYS OF YOUR PROCEDUE WITH YOUR RESULTS. Do not assume everything is normal if you have not heard from DR. Delante Karapetyan IN ONE WEEK, CALL HER OFFICE AT [phone].  SEEK IMMEDIATE MEDICAL ATTENTION AND CALL THE OFFICE: [phone] IF:  You have more than a spotting of blood in your stool.   Your belly is swollen (abdominal distention).   You are nauseated or vomiting.   You have a temperature over 101F.   You have abdominal pain or discomfort that is severe or gets worse throughout the day.   Constipation in Adults Constipation is having fewer than 2 bowel movements per week. Usually, the stools are hard. As we grow older, constipation is more common. If you try to fix constipation with laxatives, the problem may get worse. This is because laxatives taken over a long period of time make the colon muscles weaker. A  low-fiber diet, not taking in enough fluids, and taking some medicines may make these problems worse.  HOME CARE INSTRUCTIONS  Constipation is usually best cared for without medicines. Increasing dietary fiber and eating more fruits and vegetables is the best way to manage constipation.   Slowly increase fiber intake to 25 to 38 grams per day. Whole grains, fruits, vegetables, and legumes are good sources of fiber. A dietitian can further help you incorporate high-fiber foods into your diet.   Drink enough water and fluids to keep your urine  clear or pale yellow.   A fiber supplement may be added to your diet if you cannot get enough fiber from foods.   Increasing your activities also helps improve regularity.   Stronger measures, such as magnesium sulfate, should be avoided if possible. This may cause uncontrollable diarrhea. Using magnesium sulfate may not allow you time to make it to the bathroom.    High-Fiber Diet A high-fiber diet changes your normal diet to include more whole grains, legumes, fruits, and vegetables. Changes in the diet involve replacing refined carbohydrates with unrefined foods. The calorie level of the diet is essentially unchanged. The Dietary Reference Intake (recommended amount) for adult males is 38 grams per day. For adult females, it is 25 grams per day. Pregnant and lactating women should consume 28 grams of fiber per day. Fiber is the intact part of a plant that is not broken down during digestion. Functional fiber is fiber that has been isolated from the plant to provide a beneficial effect in the body. PURPOSE  Increase stool bulk.   Ease and regulate bowel movements.   Lower cholesterol.   REDUCE RISK OF COLON CANCER  INDICATIONS THAT YOU NEED MORE FIBER  Constipation and hemorrhoids.   Uncomplicated diverticulosis (intestine condition) and irritable bowel syndrome.   Weight management.   As a protective measure against hardening of the arteries (atherosclerosis), diabetes, and cancer.   GUIDELINES FOR INCREASING FIBER IN THE DIET  Start adding fiber to the diet slowly. A gradual increase of about 5 more grams (2 slices of whole-wheat bread, 2 servings of most fruits or vegetables, or 1 bowl of high-fiber cereal) per day is best. Too rapid an increase in fiber may result in constipation, flatulence, and bloating.   Drink enough water and fluids to keep your urine clear or pale yellow. Water, juice, or caffeine-free drinks are recommended. Not drinking enough fluid may cause  constipation.   Eat a variety of high-fiber foods rather than one type of fiber.   Try to increase your intake of fiber through using high-fiber foods rather than fiber pills or supplements that contain small amounts of fiber.   The goal is to change the types of food eaten. Do not supplement your present diet with high-fiber foods, but replace foods in your present diet.  INCLUDE A VARIETY OF FIBER SOURCES  Replace refined and processed grains with whole grains, canned fruits with fresh fruits, and incorporate other fiber sources. White rice, white breads, and most bakery goods contain little or no fiber.   Brown whole-grain rice, buckwheat oats, and many fruits and vegetables are all good sources of fiber. These include: broccoli, Brussels sprouts, cabbage, cauliflower, beets, sweet potatoes, white potatoes (skin on), carrots, tomatoes, eggplant, squash, berries, fresh fruits, and dried fruits.   Cereals appear to be the richest source of fiber. Cereal fiber is found in whole grains and bran. Bran is the fiber-rich outer coat of cereal grain, which is largely  removed in refining. In whole-grain cereals, the bran remains. In breakfast cereals, the largest amount of fiber is found in those with "bran" in their names. The fiber content is sometimes indicated on the label.   You may need to include additional fruits and vegetables each day.   In baking, for 1 cup white flour, you may use the following substitutions:   1 cup whole-wheat flour minus 2 tablespoons.   1/2 cup white flour plus 1/2 cup whole-wheat flour.   Diverticulosis Diverticulosis is a common condition that develops when small pouches (diverticula) form in the wall of the colon. The risk of diverticulosis increases with age. It happens more often in people who eat a low-fiber diet. Most individuals with diverticulosis have no symptoms. Those individuals with symptoms usually experience belly (abdominal) pain, constipation, or  loose stools (diarrhea).  HOME CARE INSTRUCTIONS  Increase the amount of fiber in your diet as directed by your caregiver or dietician. This may reduce symptoms of diverticulosis.   Drink at least 6 to 8 glasses of water each day to prevent constipation.   Try not to strain when you have a bowel movement.   THERE IS NO NEED TO Avoid nuts and seeds to prevent complications.   FOODS HAVING HIGH FIBER CONTENT INCLUDE:  Fruits. Apple, peach, pear, tangerine, raisins, prunes.   Vegetables. Brussels sprouts, asparagus, broccoli, cabbage, carrot, cauliflower, romaine lettuce, spinach, summer squash, tomato, winter squash, zucchini.   Starchy Vegetables. Baked beans, kidney beans, lima beans, split peas, lentils, potatoes (with skin).   Grains. Whole wheat bread, brown rice, bran flake cereal, plain oatmeal, white rice, shredded wheat, bran muffins.    Hemorrhoids Hemorrhoids are dilated (enlarged) veins around the rectum. Sometimes clots will form in the veins. This makes them swollen and painful. These are called thrombosed hemorrhoids. Causes of hemorrhoids include:  Constipation.   Straining to have a bowel movement.   HEAVY LIFTING  HOME CARE INSTRUCTIONS  Eat a well balanced diet and drink 6 to 8 glasses of water every day to avoid constipation. You may also use a bulk laxative.   Avoid straining to have bowel movements.   Keep anal area dry and clean.   Do not use a donut shaped pillow or sit on the toilet for long periods. This increases blood pooling and pain.   Move your bowels when your body has the urge; this will require less straining and will decrease pain and pressure.

## 2016-10-07 NOTE — Op Note (Addendum)
[facility] Patient Name: Sarah Coleman Procedure Date: 10/07/2016 9:52 AM MRN: [id number] Date of Birth: [birth year] Attending MD: Barney Drain , MD CSN: [id number] Age: 66 Admit Type: Outpatient Procedure:                Colonoscopy, SCREENING(incomplete) Indications:              Screening for colorectal malignant neoplasm-FATHER                            HAD COLON CANCER AGE > 60. Providers:                Barney Drain, MD, Charlyne Petrin RN, RN, Isabella Stalling, Technician Referring MD:             Norwood Levo. Simpson MD, MD Medicines:                Meperidine 75 mg IV, Midazolam 5 mg IV Complications:            No immediate complications. Estimated Blood Loss:     Estimated blood loss: none. Procedure:                Pre-Anesthesia Assessment:                           - Prior to the procedure, a History and Physical                            was performed, and patient medications and                            allergies were reviewed. The patient's tolerance of                            previous anesthesia was also reviewed. The risks                            and benefits of the procedure and the sedation                            options and risks were discussed with the patient.                            All questions were answered, and informed consent                            was obtained. Prior Anticoagulants: The patient has                            taken aspirin. ASA Grade Assessment: II - A patient                            with mild systemic disease. After reviewing the  risks and benefits, the patient was deemed in                            satisfactory condition to undergo the procedure.                            After obtaining informed consent, the colonoscope                            was passed under direct vision. Throughout the                            procedure, the patient's blood  pressure, pulse, and                            oxygen saturations were monitored continuously. The                            EC-[id number] ([id number]) scope was introduced through                            the anus and advanced to the the descending colon.                            The colonoscopy was technically difficult and                            complex due to significant looping. Successful                            completion of the procedure was aided by increasing                            the dose of sedation medication, straightening and                            shortening the scope to obtain bowel loop                            reduction, applying abdominal pressure and                            COLOWRAP. The patient tolerated the procedure                            poorly due to the patient's discomfort during the                            procedure and colonoscopy was aborted. Patient                            asked if she would like the procedure stopped and  she said yes. The quality of the bowel preparation                            was good. The rectum was photographed. Scope In: 10:17:22 AM Scope Out: 10:23:32 AM Total Procedure Duration: 0 hours 6 minutes 10 seconds  Findings:      Multiple large-mouthed diverticula were found in the sigmoid colon and       descending colon.      The recto-sigmoid colon and sigmoid colon revealed significantly       excessive looping.      A diffuse area of severe melanosis was found in the rectum, in the       recto-sigmoid colon, in the sigmoid colon and in the descending colon.      Internal hemorrhoids were found during retroflexion. The hemorrhoids       were moderate. Impression:               - Diverticulosis in the sigmoid colon and in the                            descending colon.                           - There was significant looping of the colon.                           -  Melanosis in the colon.                           - Internal hemorrhoids. Moderate Sedation:      Moderate (conscious) sedation was administered by the endoscopy nurse       and supervised by the endoscopist. The following parameters were       monitored: oxygen saturation, heart rate, blood pressure, and response       to care. Total physician intraservice time was 18 minutes. Recommendation:           - High fiber diet.                           - Continue present medications.                           - Repeat colonoscopy in 1-5 years for surveillance                            WITH MAC/COLOWRAP.                           - Patient has a contact number available for                            emergencies. The signs and symptoms of potential                            delayed complications were discussed with the  patient. Return to normal activities tomorrow.                            Written discharge instructions were provided to the                            patient. Procedure Code(s):        --- Professional ---                           [phone], 24, Colonoscopy, flexible; diagnostic,                            including collection of specimen(s) by brushing or                            washing, when performed (separate procedure)                           99152, Moderate sedation services provided by the                            same physician or other qualified health care                            professional performing the diagnostic or                            therapeutic service that the sedation supports,                            requiring the presence of an independent trained                            observer to assist in the monitoring of the                            patient's level of consciousness and physiological                            status; initial 15 minutes of intraservice time,                            patient age 67  years or older Diagnosis Code(s):        --- Professional ---                           Z12.11, Encounter for screening for malignant                            neoplasm of colon                           K64.8, Other hemorrhoids  K63.89, Other specified diseases of intestine                           K57.30, Diverticulosis of large intestine without                            perforation or abscess without bleeding CPT copyright 2016 [employer]. All rights reserved. The codes documented in this report are preliminary and upon coder review may  be revised to meet current compliance requirements. Barney Drain, MD Barney Drain, MD 10/07/2016 10:29:48 AM This report has been signed electronically. Number of Addenda: 0

## 2016-10-07 NOTE — Telephone Encounter (Signed)
route to DS

## 2016-10-07 NOTE — Telephone Encounter (Signed)
SCREENING TCS W/ MAC WITHIN THE NEXT 2-3 WEEKS-MOVI PREP SPLIT DOSING, FULL LIQUIDS WITH BREAKFAST

## 2016-10-07 NOTE — H&P (Signed)
Primary Care Physician:  Tula Nakayama, MD Primary Gastroenterologist:  Dr. Oneida Alar  Pre-Procedure History & Physical: HPI:  Sarah Coleman is a 66 y.o. female here for [place].  Past Medical History:  Diagnosis Date  . Allergic rhinitis   . Allergy   . GERD (gastroesophageal reflux disease) 2013  . Hyperlipidemia   . Hypertension   . Metabolic syndrome X [id number]  . Morbid obesity ([place]) 2000  . Obesity   . Prediabetes 2010    Past Surgical History:  Procedure Laterality Date  . PARTIAL HYSTERECTOMY  1987   secondary to ovarian cyst   . TUBAL LIGATION  1975    Prior to Admission medications   Medication Sig Start Date End Date Taking? Authorizing Provider  aspirin EC 81 MG tablet Take 1 tablet (81 mg total) by mouth daily. 07/22/13  Yes Fayrene Helper, MD  azelastine (ASTELIN) 0.1 % nasal spray Place 1 spray into both nostrils 2 (two) times daily. Use in each nostril as directed 02/09/16  Yes Fayrene Helper, MD  CALCIUM-MAGNESIUM-ZINC PO Take 1 tablet by mouth 2 (two) times daily.    Yes Historical Provider, MD  cholecalciferol (VITAMIN D) 1000 units tablet Take 1,000 Units by mouth daily.   Yes Historical Provider, MD  cyclobenzaprine (FLEXERIL) 10 MG tablet TAKE ONE TABLET BY MOUTH AT BEDTIME Patient taking differently: TAKE ONE TABLET BY MOUTH AT BEDTIME AS NEEDED 07/06/16  Yes Raylene Everts, MD  Digestive Enzyme CAPS Take 1 capsule by mouth daily as needed.   Yes Historical Provider, MD  estradiol (ESTRACE) 0.1 MG/GM vaginal cream Place 1 Applicatorful vaginally as needed.    Yes Historical Provider, MD  fluticasone (FLONASE) 50 MCG/ACT nasal spray USE TWO SPRAY(S) IN EACH NOSTRIL ONCE DAILY Patient taking differently: USE TWO SPRAY(S) IN EACH NOSTRIL ONCE DAILY AS NEEDED 08/25/16  Yes Fayrene Helper, MD  Iron-Vitamins (GERITOL PO) Take 1 tablet by mouth daily.   Yes Historical Provider, MD  levofloxacin (LEVAQUIN) 500 MG tablet Take 1 tablet  (500 mg total) by mouth daily. 10/03/16  Yes Raylene Everts, MD  loratadine (CLARITIN) 10 MG tablet Take 1 tablet (10 mg total) by mouth daily. 10/07/15  Yes Fayrene Helper, MD  lovastatin (MEVACOR) 20 MG tablet TAKE ONE TABLET BY MOUTH AT BEDTIME 08/27/16  Yes Fayrene Helper, MD  meclizine (ANTIVERT) 12.5 MG tablet TAKE ONE TABLET BY MOUTH THREE TIMES DAILY AS NEEDED FOR DIZZINESS 06/24/16  Yes Fayrene Helper, MD  naproxen (NAPROSYN) 500 MG tablet Take 1 tablet (500 mg total) by mouth 2 (two) times daily. Patient taking differently: Take 500 mg by mouth 2 (two) times daily as needed.  04/23/16  Yes Evalee Jefferson, PA-C  Omega-3 Fatty Acids (OMEGA-3 FISH OIL PO) Take 3 tablets by mouth daily. Omega xl    Yes Historical Provider, MD  pantoprazole (PROTONIX) 20 MG tablet TAKE ONE TABLET BY MOUTH ONCE DAILY 05/24/16  Yes Fayrene Helper, MD  polyethylene glycol-electrolytes (TRILYTE) 420 g solution Take 4,000 mLs by mouth as directed. 10/04/16  Yes Danie Binder, MD  potassium chloride (K-DUR) 10 MEQ tablet TAKE ONE TABLET BY MOUTH TWICE DAILY 08/25/16  Yes Fayrene Helper, MD  ranitidine (ZANTAC) 150 MG capsule Take 150 mg by mouth as needed for heartburn. Reported on 10/07/2015   Yes Historical Provider, MD  triamterene-hydrochlorothiazide (MAXZIDE) 75-50 MG tablet Take 1 tablet by mouth daily. 02/09/16  Yes Fayrene Helper, MD  Allergies as of 09/28/2016 - Review Complete 09/21/2016  Allergen Reaction Noted  . Ibuprofen  12/06/2007  . Metformin and related Dermatitis 07/28/2014  . Penicillins  12/06/2007    Family History  Problem Relation Age of Onset  . Heart attack Mother   . Heart disease Mother 34    massive heart attack  . Prostate cancer Father   . Cancer Father 78    colon  . Hypertension Sister   . Ovarian cancer Sister   . Cancer Brother 64    oral   . Hypertension Brother   . Heart disease Brother     Social History   Social History  . Marital status:  Married    Spouse name: N/A  . Number of children: 5  . Years of education: N/A   Occupational History  . retired     Social History Main Topics  . Smoking status: Former Smoker    Packs/day: 1.00    Years: 3.00    Quit date: 08/15/1974  . Smokeless tobacco: Never Used  . Alcohol use No  . Drug use: No  . Sexual activity: Yes   Other Topics Concern  . Not on file   Social History Narrative  . No narrative on file    Review of Systems: See HPI, otherwise negative ROS   Physical Exam: BP (!) 141/93   Pulse 98   Temp 98.7 F (37.1 C) (Oral)   Resp 18   Ht 5\' 6"  (1.676 m)   Wt 227 lb (103 kg)   SpO2 99%   BMI 36.64 kg/m  General:   Alert,  pleasant and cooperative in NAD Head:  Normocephalic and atraumatic. Neck:  Supple; Lungs:  Clear throughout to auscultation.    Heart:  Regular rate and rhythm. Abdomen:  Soft, nontender and nondistended. Normal bowel sounds, without guarding, and without rebound.   Neurologic:  Alert and  oriented x4;  grossly normal neurologically.  Impression/Plan:     SCREENING  Plan:  1. TCS TODAY. DISCUSSED PROCEDURE, BENEFITS, & RISKS: < 1% chance of medication reaction, bleeding, perforation, or rupture of spleen/liver.

## 2016-10-10 ENCOUNTER — Telehealth: Payer: Self-pay

## 2016-10-10 ENCOUNTER — Encounter (HOSPITAL_COMMUNITY): Payer: Self-pay | Admitting: Gastroenterology

## 2016-10-10 ENCOUNTER — Other Ambulatory Visit: Payer: Self-pay | Admitting: Family Medicine

## 2016-10-10 ENCOUNTER — Other Ambulatory Visit: Payer: Self-pay

## 2016-10-10 MED ORDER — PREDNISONE 5 MG (21) PO TBPK: 5.0000 mg | ORAL_TABLET | ORAL | 0 refills | Status: DC

## 2016-10-10 MED ORDER — FLUCONAZOLE 150 MG PO TABS: 150.0000 mg | ORAL_TABLET | Freq: Once | ORAL | 0 refills | Status: DC

## 2016-10-10 NOTE — Telephone Encounter (Signed)
States her spring allergies are starting to bother her really bad. Head congestion and sneezing. Wants something for them or a shot, whichever you prefer

## 2016-10-10 NOTE — Telephone Encounter (Signed)
Per Ginger, she said they are scheduling.

## 2016-10-10 NOTE — Telephone Encounter (Signed)
Pred dose pack sent, advise her pls

## 2016-10-10 NOTE — Telephone Encounter (Signed)
Tried to call with no answer  

## 2016-10-11 ENCOUNTER — Other Ambulatory Visit: Payer: Self-pay

## 2016-10-11 DIAGNOSIS — Z800 Family history of malignant neoplasm of digestive organs: Secondary | ICD-10-CM

## 2016-10-11 MED ORDER — PEG 3350-KCL-NA BICARB-NACL 420 G PO SOLR: 4000.0000 mL | ORAL | 0 refills | Status: DC

## 2016-10-11 MED ORDER — PEG-KCL-NACL-NASULF-NA ASC-C 100 G PO SOLR
1.0000 | ORAL | 0 refills | Status: DC
Start: 2016-10-11 — End: 2016-10-11

## 2016-10-11 NOTE — Telephone Encounter (Signed)
Message left

## 2016-10-11 NOTE — Telephone Encounter (Signed)
Pt is set up for 11/01/16. I have sent her new instructions in mail

## 2016-10-18 ENCOUNTER — Other Ambulatory Visit: Payer: Self-pay | Admitting: Family Medicine

## 2016-10-21 ENCOUNTER — Other Ambulatory Visit: Payer: Self-pay | Admitting: Family Medicine

## 2016-10-21 DIAGNOSIS — J302 Other seasonal allergic rhinitis: Secondary | ICD-10-CM

## 2016-10-24 NOTE — Patient Instructions (Signed)
Sarah Coleman  10/24/2016     @PREFPERIOPPHARMACY @   Your procedure is scheduled on  11/01/2016   Report to [facility] at  700  A.M.  Call this number if you have problems the morning of surgery:  [phone]   Remember:  Do not eat food or drink liquids after midnight.  Take these medicines the morning of surgery with A SIP OF WATER  Antivert, protonix, zantac.   Do not wear jewelry, make-up or nail polish.  Do not wear lotions, powders, or perfumes, or deoderant.  Do not shave 48 hours prior to surgery.  Men may shave face and neck.  Do not bring valuables to the hospital.  [facility] is not responsible for any belongings or valuables.  Contacts, dentures or bridgework may not be worn into surgery.  Leave your suitcase in the car.  After surgery it may be brought to your room.  For patients admitted to the hospital, discharge time will be determined by your treatment team.  Patients discharged the day of surgery will not be allowed to drive home.   Name and phone number of your driver:   family Special instructions:  Follow the diet and prep instructions given to you by Dr Nona Dell office.  Please read over the following fact sheets that you were given. Anesthesia Post-op Instructions and Care and Recovery After Surgery       Colonoscopy, Adult A colonoscopy is an exam to look at the entire large intestine. During the exam, a lubricated, bendable tube is inserted into the anus and then passed into the rectum, colon, and other parts of the large intestine. A colonoscopy is often done as a part of normal colorectal screening or in response to certain symptoms, such as anemia, persistent diarrhea, abdominal pain, and blood in the stool. The exam can help screen for and diagnose medical problems, including:  Tumors.  Polyps.  Inflammation.  Areas of bleeding. Tell a health care provider about:  Any allergies you have.  All medicines you are  taking, including vitamins, herbs, eye drops, creams, and over-the-counter medicines.  Any problems you or family members have had with anesthetic medicines.  Any blood disorders you have.  Any surgeries you have had.  Any medical conditions you have.  Any problems you have had passing stool. What are the risks? Generally, this is a safe procedure. However, problems may occur, including:  Bleeding.  A tear in the intestine.  A reaction to medicines given during the exam.  Infection (rare). What happens before the procedure? Eating and drinking restrictions  Follow instructions from your health care provider about eating and drinking, which may include:  A few days before the procedure - follow a low-fiber diet. Avoid nuts, seeds, dried fruit, raw fruits, and vegetables.  1-3 days before the procedure - follow a clear liquid diet. Drink only clear liquids, such as clear broth or bouillon, black coffee or tea, clear juice, clear soft drinks or sports drinks, gelatin dessert, and popsicles. Avoid any liquids that contain red or purple dye.  On the day of the procedure - do not eat or drink anything during the 2 hours before the procedure, or within the time period that your health care provider recommends. Bowel prep  If you were prescribed an oral bowel prep to clean out your colon:  Take it as told by your health care provider. Starting the day before your procedure, you  will need to drink a large amount of medicated liquid. The liquid will cause you to have multiple loose stools until your stool is almost clear or light green.  If your skin or anus gets irritated from diarrhea, you may use these to relieve the irritation:  Medicated wipes, such as adult wet wipes with aloe and vitamin E.  A skin soothing-product like petroleum jelly.  If you vomit while drinking the bowel prep, take a break for up to 60 minutes and then begin the bowel prep again. If vomiting continues and  you cannot take the bowel prep without vomiting, call your health care provider. General instructions   Ask your health care provider about changing or stopping your regular medicines. This is especially important if you are taking diabetes medicines or blood thinners.  Plan to have someone take you home from the hospital or clinic. What happens during the procedure?  An IV tube may be inserted into one of your veins.  You will be given medicine to help you relax (sedative).  To reduce your risk of infection:  Your health care team will wash or sanitize their hands.  Your anal area will be washed with soap.  You will be asked to lie on your side with your knees bent.  Your health care provider will lubricate a long, thin, flexible tube. The tube will have a camera and a light on the end.  The tube will be inserted into your anus.  The tube will be gently eased through your rectum and colon.  Air will be delivered into your colon to keep it open. You may feel some pressure or cramping.  The camera will be used to take images during the procedure.  A small tissue sample may be removed from your body to be examined under a microscope (biopsy). If any potential problems are found, the tissue will be sent to a lab for testing.  If small polyps are found, your health care provider may remove them and have them checked for cancer cells.  The tube that was inserted into your anus will be slowly removed. The procedure may vary among health care providers and hospitals. What happens after the procedure?  Your blood pressure, heart rate, breathing rate, and blood oxygen level will be monitored until the medicines you were given have worn off.  Do not drive for 24 hours after the exam.  You may have a small amount of blood in your stool.  You may pass gas and have mild abdominal cramping or bloating due to the air that was used to inflate your colon during the exam.  It is up to you  to get the results of your procedure. Ask your health care provider, or the department performing the procedure, when your results will be ready. This information is not intended to replace advice given to you by your health care provider. Make sure you discuss any questions you have with your health care provider. Document Released: 07/29/2000 Document Revised: 06/01/2016 Document Reviewed: 10/13/2015 Elsevier Interactive Patient Education  2017 [employer]  Colonoscopy, Adult, Care After This sheet gives you information about how to care for yourself after your procedure. Your health care provider may also give you more specific instructions. If you have problems or questions, contact your health care provider. What can I expect after the procedure? After the procedure, it is common to have:  A small amount of blood in your stool for 24 hours after the procedure.  Some  gas.  Mild abdominal cramping or bloating. Follow these instructions at home: General instructions    For the first 24 hours after the procedure:  Do not drive or use machinery.  Do not sign important documents.  Do not drink alcohol.  Do your regular daily activities at a slower pace than normal.  Eat soft, easy-to-digest foods.  Rest often.  Take over-the-counter or prescription medicines only as told by your health care provider.  It is up to you to get the results of your procedure. Ask your health care provider, or the department performing the procedure, when your results will be ready. Relieving cramping and bloating   Try walking around when you have cramps or feel bloated.  Apply heat to your abdomen as told by your health care provider. Use a heat source that your health care provider recommends, such as a moist heat pack or a heating pad.  Place a towel between your skin and the heat source.  Leave the heat on for 20-30 minutes.  Remove the heat if your skin turns bright red. This is  especially important if you are unable to feel pain, heat, or cold. You may have a greater risk of getting burned. Eating and drinking   Drink enough fluid to keep your urine clear or pale yellow.  Resume your normal diet as instructed by your health care provider. Avoid heavy or fried foods that are hard to digest.  Avoid drinking alcohol for as long as instructed by your health care provider. Contact a health care provider if:  You have blood in your stool 2-3 days after the procedure. Get help right away if:  You have more than a small spotting of blood in your stool.  You pass large blood clots in your stool.  Your abdomen is swollen.  You have nausea or vomiting.  You have a fever.  You have increasing abdominal pain that is not relieved with medicine. This information is not intended to replace advice given to you by your health care provider. Make sure you discuss any questions you have with your health care provider. Document Released: 03/15/2004 Document Revised: 04/25/2016 Document Reviewed: 10/13/2015 Elsevier Interactive Patient Education  2017 [employer] Anesthesia is a term that refers to techniques, procedures, and medicines that help a person stay safe and comfortable during a medical procedure. Monitored anesthesia care, or sedation, is one type of anesthesia. Your anesthesia specialist may recommend sedation if you will be having a procedure that does not require you to be unconscious, such as:  Cataract surgery.  A dental procedure.  A biopsy.  A colonoscopy. During the procedure, you may receive a medicine to help you relax (sedative). There are three levels of sedation:  Mild sedation. At this level, you may feel awake and relaxed. You will be able to follow directions.  Moderate sedation. At this level, you will be sleepy. You may not remember the procedure.  Deep sedation. At this level, you will be asleep. You will not  remember the procedure. The more medicine you are given, the deeper your level of sedation will be. Depending on how you respond to the procedure, the anesthesia specialist may change your level of sedation or the type of anesthesia to fit your needs. An anesthesia specialist will monitor you closely during the procedure. Let your health care provider know about:  Any allergies you have.  All medicines you are taking, including vitamins, herbs, eye drops, creams, and over-the-counter  medicines.  Any use of steroids (by mouth or as a cream).  Any problems you or family members have had with sedatives and anesthetic medicines.  Any blood disorders you have.  Any surgeries you have had.  Any medical conditions you have, such as sleep apnea.  Whether you are pregnant or may be pregnant.  Any use of cigarettes, alcohol, or street drugs. What are the risks? Generally, this is a safe procedure. However, problems may occur, including:  Getting too much medicine (oversedation).  Nausea.  Allergic reaction to medicines.  Trouble breathing. If this happens, a breathing tube may be used to help with breathing. It will be removed when you are awake and breathing on your own.  Heart trouble.  Lung trouble. Before the procedure Staying hydrated  Follow instructions from your health care provider about hydration, which may include:  Up to 2 hours before the procedure - you may continue to drink clear liquids, such as water, clear fruit juice, black coffee, and plain tea. Eating and drinking restrictions  Follow instructions from your health care provider about eating and drinking, which may include:  8 hours before the procedure - stop eating heavy meals or foods such as meat, fried foods, or fatty foods.  6 hours before the procedure - stop eating light meals or foods, such as toast or cereal.  6 hours before the procedure - stop drinking milk or drinks that contain milk.  2 hours  before the procedure - stop drinking clear liquids. Medicines  Ask your health care provider about:  Changing or stopping your regular medicines. This is especially important if you are taking diabetes medicines or blood thinners.  Taking medicines such as aspirin and ibuprofen. These medicines can thin your blood. Do not take these medicines before your procedure if your health care provider instructs you not to. Tests and exams  You will have a physical exam.  You may have blood tests done to show:  How well your kidneys and liver are working.  How well your blood can clot.  General instructions  Plan to have someone take you home from the hospital or clinic.  If you will be going home right after the procedure, plan to have someone with you for 24 hours. What happens during the procedure?  Your blood pressure, heart rate, breathing, level of pain and overall condition will be monitored.  An IV tube will be inserted into one of your veins.  Your anesthesia specialist will give you medicines as needed to keep you comfortable during the procedure. This may mean changing the level of sedation.  The procedure will be performed. After the procedure  Your blood pressure, heart rate, breathing rate, and blood oxygen level will be monitored until the medicines you were given have worn off.  Do not drive for 24 hours if you received a sedative.  You may:  Feel sleepy, clumsy, or nauseous.  Feel forgetful about what happened after the procedure.  Have a sore throat if you had a breathing tube during the procedure.  Vomit. This information is not intended to replace advice given to you by your health care provider. Make sure you discuss any questions you have with your health care provider. Document Released: 04/27/2005 Document Revised: 01/08/2016 Document Reviewed: 11/22/2015 Elsevier Interactive Patient Education  2017 [place], Care  After These instructions provide you with information about caring for yourself after your procedure. Your health care provider may also give you more  specific instructions. Your treatment has been planned according to current medical practices, but problems sometimes occur. Call your health care provider if you have any problems or questions after your procedure. What can I expect after the procedure? After your procedure, it is common to:  Feel sleepy for several hours.  Feel clumsy and have poor balance for several hours.  Feel forgetful about what happened after the procedure.  Have poor judgment for several hours.  Feel nauseous or vomit.  Have a sore throat if you had a breathing tube during the procedure. Follow these instructions at home: For at least 24 hours after the procedure:    Do not:  Participate in activities in which you could fall or become injured.  Drive.  Use heavy machinery.  Drink alcohol.  Take sleeping pills or medicines that cause drowsiness.  Make important decisions or sign legal documents.  Take care of children on your own.  Rest. Eating and drinking   Follow the diet that is recommended by your health care provider.  If you vomit, drink water, juice, or soup when you can drink without vomiting.  Make sure you have little or no nausea before eating solid foods. General instructions   Have a responsible adult stay with you until you are awake and alert.  Take over-the-counter and prescription medicines only as told by your health care provider.  If you smoke, do not smoke without supervision.  Keep all follow-up visits as told by your health care provider. This is important. Contact a health care provider if:  You keep feeling nauseous or you keep vomiting.  You feel light-headed.  You develop a rash.  You have a fever. Get help right away if:  You have trouble breathing. This information is not intended to replace advice  given to you by your health care provider. Make sure you discuss any questions you have with your health care provider. Document Released: 11/22/2015 Document Revised: 03/23/2016 Document Reviewed: 11/22/2015 Elsevier Interactive Patient Education  2017 [employer].

## 2016-10-25 NOTE — Patient Instructions (Signed)
Sarah Coleman  10/25/2016     @PREFPERIOPPHARMACY @   Your procedure is scheduled on  11/01/2016   Report to [facility] at  700   A.M.  Call this number if you have problems the morning of surgery:  [phone]   Remember:  Do not eat food or drink liquids after midnight.  Take these medicines the morning of surgery with A SIP OF WATER  Antivert, protonix, zantac.   Do not wear jewelry, make-up or nail polish.  Do not wear lotions, powders, or perfumes, or deoderant.  Do not shave 48 hours prior to surgery.  Men may shave face and neck.  Do not bring valuables to the hospital.  [facility] is not responsible for any belongings or valuables.  Contacts, dentures or bridgework may not be worn into surgery.  Leave your suitcase in the car.  After surgery it may be brought to your room.  For patients admitted to the hospital, discharge time will be determined by your treatment team.  Patients discharged the day of surgery will not be allowed to drive home.   Name and phone number of your driver:   family Special instructions:  Follow the diet and prep instructions given to you by Dr Nona Dell office.  Please read over the following fact sheets that you were given. Anesthesia Post-op Instructions and Care and Recovery After Surgery       Colonoscopy, Adult A colonoscopy is an exam to look at the entire large intestine. During the exam, a lubricated, bendable tube is inserted into the anus and then passed into the rectum, colon, and other parts of the large intestine. A colonoscopy is often done as a part of normal colorectal screening or in response to certain symptoms, such as anemia, persistent diarrhea, abdominal pain, and blood in the stool. The exam can help screen for and diagnose medical problems, including:  Tumors.  Polyps.  Inflammation.  Areas of bleeding. Tell a health care provider about:  Any allergies you have.  All medicines you are  taking, including vitamins, herbs, eye drops, creams, and over-the-counter medicines.  Any problems you or family members have had with anesthetic medicines.  Any blood disorders you have.  Any surgeries you have had.  Any medical conditions you have.  Any problems you have had passing stool. What are the risks? Generally, this is a safe procedure. However, problems may occur, including:  Bleeding.  A tear in the intestine.  A reaction to medicines given during the exam.  Infection (rare). What happens before the procedure? Eating and drinking restrictions  Follow instructions from your health care provider about eating and drinking, which may include:  A few days before the procedure - follow a low-fiber diet. Avoid nuts, seeds, dried fruit, raw fruits, and vegetables.  1-3 days before the procedure - follow a clear liquid diet. Drink only clear liquids, such as clear broth or bouillon, black coffee or tea, clear juice, clear soft drinks or sports drinks, gelatin dessert, and popsicles. Avoid any liquids that contain red or purple dye.  On the day of the procedure - do not eat or drink anything during the 2 hours before the procedure, or within the time period that your health care provider recommends. Bowel prep  If you were prescribed an oral bowel prep to clean out your colon:  Take it as told by your health care provider. Starting the day before your procedure,  you will need to drink a large amount of medicated liquid. The liquid will cause you to have multiple loose stools until your stool is almost clear or light green.  If your skin or anus gets irritated from diarrhea, you may use these to relieve the irritation:  Medicated wipes, such as adult wet wipes with aloe and vitamin E.  A skin soothing-product like petroleum jelly.  If you vomit while drinking the bowel prep, take a break for up to 60 minutes and then begin the bowel prep again. If vomiting continues and  you cannot take the bowel prep without vomiting, call your health care provider. General instructions   Ask your health care provider about changing or stopping your regular medicines. This is especially important if you are taking diabetes medicines or blood thinners.  Plan to have someone take you home from the hospital or clinic. What happens during the procedure?  An IV tube may be inserted into one of your veins.  You will be given medicine to help you relax (sedative).  To reduce your risk of infection:  Your health care team will wash or sanitize their hands.  Your anal area will be washed with soap.  You will be asked to lie on your side with your knees bent.  Your health care provider will lubricate a long, thin, flexible tube. The tube will have a camera and a light on the end.  The tube will be inserted into your anus.  The tube will be gently eased through your rectum and colon.  Air will be delivered into your colon to keep it open. You may feel some pressure or cramping.  The camera will be used to take images during the procedure.  A small tissue sample may be removed from your body to be examined under a microscope (biopsy). If any potential problems are found, the tissue will be sent to a lab for testing.  If small polyps are found, your health care provider may remove them and have them checked for cancer cells.  The tube that was inserted into your anus will be slowly removed. The procedure may vary among health care providers and hospitals. What happens after the procedure?  Your blood pressure, heart rate, breathing rate, and blood oxygen level will be monitored until the medicines you were given have worn off.  Do not drive for 24 hours after the exam.  You may have a small amount of blood in your stool.  You may pass gas and have mild abdominal cramping or bloating due to the air that was used to inflate your colon during the exam.  It is up to you  to get the results of your procedure. Ask your health care provider, or the department performing the procedure, when your results will be ready. This information is not intended to replace advice given to you by your health care provider. Make sure you discuss any questions you have with your health care provider. Document Released: 07/29/2000 Document Revised: 06/01/2016 Document Reviewed: 10/13/2015 Elsevier Interactive Patient Education  2017 [employer]  Colonoscopy, Adult, Care After This sheet gives you information about how to care for yourself after your procedure. Your health care provider may also give you more specific instructions. If you have problems or questions, contact your health care provider. What can I expect after the procedure? After the procedure, it is common to have:  A small amount of blood in your stool for 24 hours after the procedure.  Some gas.  Mild abdominal cramping or bloating. Follow these instructions at home: General instructions    For the first 24 hours after the procedure:  Do not drive or use machinery.  Do not sign important documents.  Do not drink alcohol.  Do your regular daily activities at a slower pace than normal.  Eat soft, easy-to-digest foods.  Rest often.  Take over-the-counter or prescription medicines only as told by your health care provider.  It is up to you to get the results of your procedure. Ask your health care provider, or the department performing the procedure, when your results will be ready. Relieving cramping and bloating   Try walking around when you have cramps or feel bloated.  Apply heat to your abdomen as told by your health care provider. Use a heat source that your health care provider recommends, such as a moist heat pack or a heating pad.  Place a towel between your skin and the heat source.  Leave the heat on for 20-30 minutes.  Remove the heat if your skin turns bright red. This is  especially important if you are unable to feel pain, heat, or cold. You may have a greater risk of getting burned. Eating and drinking   Drink enough fluid to keep your urine clear or pale yellow.  Resume your normal diet as instructed by your health care provider. Avoid heavy or fried foods that are hard to digest.  Avoid drinking alcohol for as long as instructed by your health care provider. Contact a health care provider if:  You have blood in your stool 2-3 days after the procedure. Get help right away if:  You have more than a small spotting of blood in your stool.  You pass large blood clots in your stool.  Your abdomen is swollen.  You have nausea or vomiting.  You have a fever.  You have increasing abdominal pain that is not relieved with medicine. This information is not intended to replace advice given to you by your health care provider. Make sure you discuss any questions you have with your health care provider. Document Released: 03/15/2004 Document Revised: 04/25/2016 Document Reviewed: 10/13/2015 Elsevier Interactive Patient Education  2017 [place] Anesthesia is a term that refers to techniques, procedures, and medicines that help a person stay safe and comfortable during a medical procedure. Monitored anesthesia care, or sedation, is one type of anesthesia. Your anesthesia specialist may recommend sedation if you will be having a procedure that does not require you to be unconscious, such as:  Cataract surgery.  A dental procedure.  A biopsy.  A colonoscopy. During the procedure, you may receive a medicine to help you relax (sedative). There are three levels of sedation:  Mild sedation. At this level, you may feel awake and relaxed. You will be able to follow directions.  Moderate sedation. At this level, you will be sleepy. You may not remember the procedure.  Deep sedation. At this level, you will be asleep. You will not  remember the procedure. The more medicine you are given, the deeper your level of sedation will be. Depending on how you respond to the procedure, the anesthesia specialist may change your level of sedation or the type of anesthesia to fit your needs. An anesthesia specialist will monitor you closely during the procedure. Let your health care provider know about:  Any allergies you have.  All medicines you are taking, including vitamins, herbs, eye drops, creams, and  over-the-counter medicines.  Any use of steroids (by mouth or as a cream).  Any problems you or family members have had with sedatives and anesthetic medicines.  Any blood disorders you have.  Any surgeries you have had.  Any medical conditions you have, such as sleep apnea.  Whether you are pregnant or may be pregnant.  Any use of cigarettes, alcohol, or street drugs. What are the risks? Generally, this is a safe procedure. However, problems may occur, including:  Getting too much medicine (oversedation).  Nausea.  Allergic reaction to medicines.  Trouble breathing. If this happens, a breathing tube may be used to help with breathing. It will be removed when you are awake and breathing on your own.  Heart trouble.  Lung trouble. Before the procedure Staying hydrated  Follow instructions from your health care provider about hydration, which may include:  Up to 2 hours before the procedure - you may continue to drink clear liquids, such as water, clear fruit juice, black coffee, and plain tea. Eating and drinking restrictions  Follow instructions from your health care provider about eating and drinking, which may include:  8 hours before the procedure - stop eating heavy meals or foods such as meat, fried foods, or fatty foods.  6 hours before the procedure - stop eating light meals or foods, such as toast or cereal.  6 hours before the procedure - stop drinking milk or drinks that contain milk.  2 hours  before the procedure - stop drinking clear liquids. Medicines  Ask your health care provider about:  Changing or stopping your regular medicines. This is especially important if you are taking diabetes medicines or blood thinners.  Taking medicines such as aspirin and ibuprofen. These medicines can thin your blood. Do not take these medicines before your procedure if your health care provider instructs you not to. Tests and exams  You will have a physical exam.  You may have blood tests done to show:  How well your kidneys and liver are working.  How well your blood can clot.  General instructions  Plan to have someone take you home from the hospital or clinic.  If you will be going home right after the procedure, plan to have someone with you for 24 hours. What happens during the procedure?  Your blood pressure, heart rate, breathing, level of pain and overall condition will be monitored.  An IV tube will be inserted into one of your veins.  Your anesthesia specialist will give you medicines as needed to keep you comfortable during the procedure. This may mean changing the level of sedation.  The procedure will be performed. After the procedure  Your blood pressure, heart rate, breathing rate, and blood oxygen level will be monitored until the medicines you were given have worn off.  Do not drive for 24 hours if you received a sedative.  You may:  Feel sleepy, clumsy, or nauseous.  Feel forgetful about what happened after the procedure.  Have a sore throat if you had a breathing tube during the procedure.  Vomit. This information is not intended to replace advice given to you by your health care provider. Make sure you discuss any questions you have with your health care provider. Document Released: 04/27/2005 Document Revised: 01/08/2016 Document Reviewed: 11/22/2015 Elsevier Interactive Patient Education  2017 [place], Care  After These instructions provide you with information about caring for yourself after your procedure. Your health care provider may also give you  more specific instructions. Your treatment has been planned according to current medical practices, but problems sometimes occur. Call your health care provider if you have any problems or questions after your procedure. What can I expect after the procedure? After your procedure, it is common to:  Feel sleepy for several hours.  Feel clumsy and have poor balance for several hours.  Feel forgetful about what happened after the procedure.  Have poor judgment for several hours.  Feel nauseous or vomit.  Have a sore throat if you had a breathing tube during the procedure. Follow these instructions at home: For at least 24 hours after the procedure:    Do not:  Participate in activities in which you could fall or become injured.  Drive.  Use heavy machinery.  Drink alcohol.  Take sleeping pills or medicines that cause drowsiness.  Make important decisions or sign legal documents.  Take care of children on your own.  Rest. Eating and drinking   Follow the diet that is recommended by your health care provider.  If you vomit, drink water, juice, or soup when you can drink without vomiting.  Make sure you have little or no nausea before eating solid foods. General instructions   Have a responsible adult stay with you until you are awake and alert.  Take over-the-counter and prescription medicines only as told by your health care provider.  If you smoke, do not smoke without supervision.  Keep all follow-up visits as told by your health care provider. This is important. Contact a health care provider if:  You keep feeling nauseous or you keep vomiting.  You feel light-headed.  You develop a rash.  You have a fever. Get help right away if:  You have trouble breathing. This information is not intended to replace advice  given to you by your health care provider. Make sure you discuss any questions you have with your health care provider. Document Released: 11/22/2015 Document Revised: 03/23/2016 Document Reviewed: 11/22/2015 Elsevier Interactive Patient Education  2017 [employer].

## 2016-10-26 ENCOUNTER — Other Ambulatory Visit: Payer: Self-pay | Admitting: Family Medicine

## 2016-10-26 ENCOUNTER — Encounter (HOSPITAL_COMMUNITY): Payer: Self-pay

## 2016-10-26 ENCOUNTER — Telehealth: Payer: Self-pay | Admitting: Gastroenterology

## 2016-10-26 ENCOUNTER — Encounter (HOSPITAL_COMMUNITY)
Admission: RE | Admit: 2016-10-26 | Discharge: 2016-10-26 | Disposition: A | Discharge status: Home / Self Care | Payer: PPO | Source: Ambulatory Visit | Attending: Gastroenterology | Admitting: Gastroenterology

## 2016-10-26 DIAGNOSIS — Z01812 Encounter for preprocedural laboratory examination: Secondary | ICD-10-CM | POA: Insufficient documentation

## 2016-10-26 HISTORY — DX: Anemia, unspecified: D64.9

## 2016-10-26 HISTORY — DX: Unspecified osteoarthritis, unspecified site: M19.90

## 2016-10-26 LAB — BASIC METABOLIC PANEL
ANION GAP: 10 (ref 5–15)
BUN: 16 mg/dL (ref 6–20)
CALCIUM: 9.4 mg/dL (ref 8.9–10.3)
CO2: 28 mmol/L (ref 22–32)
Chloride: 96 mmol/L — ABNORMAL LOW (ref 101–111)
Creatinine, Ser: 1 mg/dL (ref 0.44–1.00)
GFR calc Af Amer: >60 mL/min (ref >60)
GFR, EST NON AFRICAN AMERICAN: 57 mL/min — AB (ref >60)
GLUCOSE: 138 mg/dL — AB (ref 65–99)
Potassium: 3 mmol/L — ABNORMAL LOW (ref 3.5–5.1)
Sodium: 134 mmol/L — ABNORMAL LOW (ref 135–145)

## 2016-10-26 LAB — CBC WITH DIFFERENTIAL/PLATELET
Basophils Absolute: 0 10*3/uL (ref 0.0–0.1)
Basophils Relative: 0 %
EOS PCT: 1 %
Eosinophils Absolute: 0.1 10*3/uL (ref 0.0–0.7)
HCT: 38.4 % (ref 36.0–46.0)
Hemoglobin: 13.4 g/dL (ref 12.0–15.0)
LYMPHS ABS: 4.2 10*3/uL — AB (ref 0.7–4.0)
LYMPHS PCT: 47 %
MCH: 31.2 pg (ref 26.0–34.0)
MCHC: 34.9 g/dL (ref 30.0–36.0)
MCV: 89.3 fL (ref 78.0–100.0)
MONO ABS: 0.6 10*3/uL (ref 0.1–1.0)
MONOS PCT: 7 %
NEUTROS ABS: 4.1 10*3/uL (ref 1.7–7.7)
Neutrophils Relative %: 45 %
PLATELETS: 279 10*3/uL (ref 150–400)
RBC: 4.3 MIL/uL (ref 3.87–5.11)
RDW: 13.6 % (ref 11.5–15.5)
WBC: 8.9 10*3/uL (ref 4.0–10.5)

## 2016-10-26 MED ORDER — POTASSIUM CHLORIDE ER 20 MEQ PO TBCR: EXTENDED_RELEASE_TABLET | ORAL | 0 refills | Status: DC

## 2016-10-26 NOTE — Pre-Procedure Instructions (Signed)
Potassium of 3.0 shown to Dr Patsey Berthold. Called patient per his order to instruct her to take 30 mEq potassium BID until procedure and we will recheck potassium morning of procedure.

## 2016-10-26 NOTE — Telephone Encounter (Signed)
PLEASE CALL PT. HER K IS 3.0 SHE NEEDS  KCL 40 MEQ TWO DOSES TODAY 4 HRS APART. MAR 15: KCL 40 MEQ DAILY FOR 3 DAYS. HER OTHER LABS ARE NORMAL.

## 2016-10-27 ENCOUNTER — Inpatient Hospital Stay (HOSPITAL_COMMUNITY)
Admission: RE | Admit: 2016-10-27 | Discharge: 2016-10-27 | Disposition: A | Discharge status: Home / Self Care | Payer: PPO | Source: Ambulatory Visit

## 2016-10-27 ENCOUNTER — Encounter (HOSPITAL_COMMUNITY): Payer: Self-pay

## 2016-10-27 NOTE — Telephone Encounter (Signed)
Pt called to make sure that she is taking the K the right way. I went over the instructions and she understood

## 2016-11-01 ENCOUNTER — Ambulatory Visit (HOSPITAL_COMMUNITY): Payer: PPO | Admitting: Anesthesiology

## 2016-11-01 ENCOUNTER — Encounter (HOSPITAL_COMMUNITY): Payer: Self-pay

## 2016-11-01 ENCOUNTER — Ambulatory Visit (HOSPITAL_COMMUNITY)
Admission: RE | Admit: 2016-11-01 | Discharge: 2016-11-01 | Disposition: A | Discharge status: Home / Self Care | Payer: PPO | Source: Ambulatory Visit | Attending: Gastroenterology | Admitting: Gastroenterology

## 2016-11-01 ENCOUNTER — Encounter (HOSPITAL_COMMUNITY)
Admission: RE | Disposition: A | Discharge status: Home / Self Care | Payer: Self-pay | Source: Ambulatory Visit | Attending: Gastroenterology

## 2016-11-01 ENCOUNTER — Telehealth: Payer: Self-pay | Admitting: Gastroenterology

## 2016-11-01 DIAGNOSIS — Z79890 Hormone replacement therapy (postmenopausal): Secondary | ICD-10-CM | POA: Insufficient documentation

## 2016-11-01 DIAGNOSIS — Z6836 Body mass index (BMI) 36.0-36.9, adult: Secondary | ICD-10-CM | POA: Insufficient documentation

## 2016-11-01 DIAGNOSIS — D125 Benign neoplasm of sigmoid colon: Secondary | ICD-10-CM | POA: Diagnosis not present

## 2016-11-01 DIAGNOSIS — Z79899 Other long term (current) drug therapy: Secondary | ICD-10-CM | POA: Diagnosis not present

## 2016-11-01 DIAGNOSIS — Z87891 Personal history of nicotine dependence: Secondary | ICD-10-CM | POA: Diagnosis not present

## 2016-11-01 DIAGNOSIS — K5790 Diverticulosis of intestine, part unspecified, without perforation or abscess without bleeding: Secondary | ICD-10-CM | POA: Diagnosis not present

## 2016-11-01 DIAGNOSIS — I10 Essential (primary) hypertension: Secondary | ICD-10-CM | POA: Insufficient documentation

## 2016-11-01 DIAGNOSIS — J309 Allergic rhinitis, unspecified: Secondary | ICD-10-CM | POA: Diagnosis not present

## 2016-11-01 DIAGNOSIS — Q438 Other specified congenital malformations of intestine: Secondary | ICD-10-CM | POA: Diagnosis not present

## 2016-11-01 DIAGNOSIS — D122 Benign neoplasm of ascending colon: Secondary | ICD-10-CM | POA: Diagnosis not present

## 2016-11-01 DIAGNOSIS — K648 Other hemorrhoids: Secondary | ICD-10-CM | POA: Diagnosis not present

## 2016-11-01 DIAGNOSIS — Z791 Long term (current) use of non-steroidal anti-inflammatories (NSAID): Secondary | ICD-10-CM | POA: Insufficient documentation

## 2016-11-01 DIAGNOSIS — Z800 Family history of malignant neoplasm of digestive organs: Secondary | ICD-10-CM | POA: Insufficient documentation

## 2016-11-01 DIAGNOSIS — Z1211 Encounter for screening for malignant neoplasm of colon: Secondary | ICD-10-CM | POA: Diagnosis not present

## 2016-11-01 DIAGNOSIS — Z7982 Long term (current) use of aspirin: Secondary | ICD-10-CM | POA: Diagnosis not present

## 2016-11-01 DIAGNOSIS — D120 Benign neoplasm of cecum: Secondary | ICD-10-CM

## 2016-11-01 DIAGNOSIS — K219 Gastro-esophageal reflux disease without esophagitis: Secondary | ICD-10-CM | POA: Diagnosis not present

## 2016-11-01 DIAGNOSIS — Z1212 Encounter for screening for malignant neoplasm of rectum: Secondary | ICD-10-CM

## 2016-11-01 DIAGNOSIS — E785 Hyperlipidemia, unspecified: Secondary | ICD-10-CM | POA: Diagnosis not present

## 2016-11-01 DIAGNOSIS — D123 Benign neoplasm of transverse colon: Secondary | ICD-10-CM | POA: Insufficient documentation

## 2016-11-01 DIAGNOSIS — K635 Polyp of colon: Secondary | ICD-10-CM

## 2016-11-01 HISTORY — PX: POLYPECTOMY: SHX5525

## 2016-11-01 HISTORY — PX: COLONOSCOPY WITH PROPOFOL: SHX5780

## 2016-11-01 LAB — GLUCOSE, CAPILLARY: GLUCOSE-CAPILLARY: 145 mg/dL — AB (ref 65–99)

## 2016-11-01 SURGERY — COLONOSCOPY WITH PROPOFOL
Anesthesia: Monitor Anesthesia Care

## 2016-11-01 MED ORDER — MIDAZOLAM HCL 2 MG/2ML IJ SOLN
INTRAMUSCULAR | Status: AC
  Filled 2016-11-01: qty 2

## 2016-11-01 MED ORDER — SODIUM CHLORIDE 0.9 % IJ SOLN
INTRAMUSCULAR | Status: DC | PRN
  Administered 2016-11-01 (×2): 2 mL
  Administered 2016-11-01: 1 mL
  Administered 2016-11-01: 2 mL

## 2016-11-01 MED ORDER — GLYCOPYRROLATE 0.2 MG/ML IJ SOLN
INTRAMUSCULAR | Status: AC
  Filled 2016-11-01: qty 1

## 2016-11-01 MED ORDER — SODIUM CHLORIDE 0.9 % IJ SOLN
INTRAMUSCULAR | Status: AC
  Filled 2016-11-01: qty 10

## 2016-11-01 MED ORDER — PROPOFOL 10 MG/ML IV BOLUS
INTRAVENOUS | Status: AC
  Filled 2016-11-01: qty 20

## 2016-11-01 MED ORDER — FENTANYL CITRATE (PF) 100 MCG/2ML IJ SOLN
25.0000 ug | Freq: Once | INTRAMUSCULAR | Status: AC
  Administered 2016-11-01: 25 ug via INTRAVENOUS

## 2016-11-01 MED ORDER — MIDAZOLAM HCL 5 MG/5ML IJ SOLN
INTRAMUSCULAR | Status: DC | PRN
  Administered 2016-11-01 (×2): 1 mg via INTRAVENOUS

## 2016-11-01 MED ORDER — SODIUM CHLORIDE 0.9% FLUSH
INTRAVENOUS | Status: AC
  Filled 2016-11-01: qty 10

## 2016-11-01 MED ORDER — EPINEPHRINE PF 1 MG/10ML IJ SOSY
PREFILLED_SYRINGE | INTRAMUSCULAR | Status: DC | PRN
  Administered 2016-11-01: 1 mL
  Administered 2016-11-01: 2 mL
  Administered 2016-11-01: 1 mL

## 2016-11-01 MED ORDER — FENTANYL CITRATE (PF) 100 MCG/2ML IJ SOLN
INTRAMUSCULAR | Status: AC
  Filled 2016-11-01: qty 2

## 2016-11-01 MED ORDER — CHLORHEXIDINE GLUCONATE CLOTH 2 % EX PADS: 6.0000 | MEDICATED_PAD | Freq: Once | CUTANEOUS | Status: DC

## 2016-11-01 MED ORDER — EPINEPHRINE PF 1 MG/10ML IJ SOSY
PREFILLED_SYRINGE | INTRAMUSCULAR | Status: AC
  Filled 2016-11-01: qty 10

## 2016-11-01 MED ORDER — MIDAZOLAM HCL 2 MG/2ML IJ SOLN
1.0000 mg | INTRAMUSCULAR | Status: AC
Start: 2016-11-01 — End: 2016-11-01
  Administered 2016-11-01: 2 mg via INTRAVENOUS

## 2016-11-01 MED ORDER — PROPOFOL 10 MG/ML IV BOLUS
INTRAVENOUS | Status: AC
  Filled 2016-11-01: qty 40

## 2016-11-01 MED ORDER — PROPOFOL 500 MG/50ML IV EMUL
INTRAVENOUS | Status: DC | PRN
  Administered 2016-11-01 (×3): via INTRAVENOUS
  Administered 2016-11-01: 75 ug/kg/min via INTRAVENOUS
  Administered 2016-11-01: 10:00:00 via INTRAVENOUS

## 2016-11-01 MED ORDER — GLYCOPYRROLATE 0.2 MG/ML IJ SOLN
0.2000 mg | Freq: Once | INTRAMUSCULAR | Status: AC | PRN
  Administered 2016-11-01: 0.2 mg via INTRAVENOUS

## 2016-11-01 MED ORDER — EPHEDRINE SULFATE 50 MG/ML IJ SOLN
INTRAMUSCULAR | Status: AC
  Filled 2016-11-01: qty 1

## 2016-11-01 MED ORDER — LACTATED RINGERS IV SOLN
INTRAVENOUS | Status: DC
  Administered 2016-11-01: 08:00:00 via INTRAVENOUS

## 2016-11-01 NOTE — Op Note (Signed)
[facility] Patient Name: Sarah Coleman Procedure Date: 11/01/2016 7:33 AM MRN: [id number] Date of Birth: Jan 03, 1951 Attending MD: Barney Drain , MD CSN: [id number] Age: 66 Admit Type: Outpatient Procedure:                Colonoscopy WITH ENDOSCOPIC MUCOSAL RESECTION,                            SNARE/COLD FORCEPS POLYPECTOMY Indications:              Screening patient at increased risk: Family history                            of 1st-degree relative with colorectal cancer at                            age 29 years (or older) Providers:                Barney Drain, MD, Otis Peak B. Sharon Seller, RN, Aram Candela Referring MD:             Norwood Levo. Simpson MD, MD Medicines:                Propofol per Anesthesia Complications:            No immediate complications. Estimated Blood Loss:     Estimated blood loss was minimal. Procedure:                Pre-Anesthesia Assessment:                           - Prior to the procedure, a History and Physical                            was performed, and patient medications and                            allergies were reviewed. The patient's tolerance of                            previous anesthesia was also reviewed. The risks                            and benefits of the procedure and the sedation                            options and risks were discussed with the patient.                            All questions were answered, and informed consent                            was obtained. Prior Anticoagulants: The patient has  taken aspirin, last dose was 1 day prior to                            procedure. ASA Grade Assessment: II - A patient                            with mild systemic disease. After reviewing the                            risks and benefits, the patient was deemed in                            satisfactory condition to undergo the procedure.      After obtaining informed consent, the colonoscope                            was passed under direct vision. Throughout the                            procedure, the patient's blood pressure, pulse, and                            oxygen saturations were monitored continuously. The                            EC-[id number] ([id number]) scope was introduced through                            the anus and advanced to the the cecum, identified                            by appendiceal orifice and ileocecal valve. The                            colonoscopy was technically difficult and complex                            due to significant looping and the patient's                            agitation. Successful completion of the procedure                            was aided by increasing the dose of sedation                            medication, changing the patient to a supine                            position, changing the patient to a prone position,                            using manual pressure, straightening and shortening  the scope to obtain bowel loop reduction, applying                            abdominal pressure and COLOWRAP. The patient                            tolerated the procedure well. The quality of the                            bowel preparation was excellent. The ileocecal                            valve, appendiceal orifice, and rectum were                            photographed. Scope In: 8:56:49 AM Scope Out: 10:31:22 AM Scope Withdrawal Time: 1 hour 12 minutes 36 seconds  Total Procedure Duration: 1 hour 34 minutes 33 seconds  Findings:      Two flat polyps were found in the cecum. The polyps were 5 to 9 mm in       size. Area was successfully injected with 2 mL saline for a lift       polypectomy. These polyps were removed with a saline injection-lift       technique using a hot snare. Resection and retrieval were complete. To        prevent bleeding post-intervention, two hemostatic clips were       successfully placed (MR conditional). There was no bleeding at the end       of the procedure.      Three flat polyps were found in the proximal ascending colon. The polyps       were 3 to 4 mm in size. To prevent bleeding post-intervention, two       hemostatic clips were successfully placed (MR conditional). There was no       bleeding at the end of the procedure. These polyps were removed with a       cold biopsy forceps. Resection and retrieval were complete.      Two flat polyps were found in the hepatic flexure. The polyps were 4 to       12 mm in size. Area was successfully injected with 3 mL of a 1:10,000       solution of epinephrine for hemostasis. Area was successfully injected       with 2 mL saline for a lift polypectomy. These polyps were removed with       a saline injection-lift technique using a hot snare. Resection and       retrieval were complete.      Two flat polyps were found in the proximal transverse colon. The polyps       were 5 to 7 mm in size. Area was successfully injected with 1 mL of a       1:10,000 solution of epinephrine for hemostasis. Area was successfully       injected with 1 mL saline for a lift polypectomy. These polyps were       removed with a saline injection-lift technique using a hot snare.       Resection and retrieval were complete.      A 4 mm polyp  was found in the sigmoid colon. The polyp was flat. Area       was successfully injected with 1 mL of a 1:10,000 solution of       epinephrine for hemostasis. The polyp was removed with a hot snare.       Resection and retrieval were complete.      Multiple small and large-mouthed diverticula were found in the sigmoid       colon, descending colon and ascending colon.      Non-bleeding internal hemorrhoids were found during retroflexion. The       hemorrhoids were moderate.      The sigmoid colon, descending colon and transverse  colon were       significantly redundant. Impression:               - TEN COLON POLYPS REMOVED                           - MILD Diverticulosis in the sigmoid colon, in the                            descending colon and in the ascending colon.                           - SMALL Non-bleeding internal hemorrhoids.                           - EXTREMELY Redundant LEFT colon. Moderate Sedation:      Per Anesthesia Care Recommendation:           - High fiber diet.                           - Continue present medications.                           - Await pathology results.                           - Repeat colonoscopy 1-3 YEARS for surveillance.                           - Patient has a contact number available for                            emergencies. The signs and symptoms of potential                            delayed complications were discussed with the                            patient. Return to normal activities tomorrow.                            Written discharge instructions were provided to the                            patient. Procedure Code(s):        ---  Professional ---                           [phone], Colonoscopy, flexible; with removal of                            tumor(s), polyp(s), or other lesion(s) by snare                            technique                           45381, Colonoscopy, flexible; with directed                            submucosal injection(s), any substance                           51761, 59, Colonoscopy, flexible; with biopsy,                            single or multiple Diagnosis Code(s):        --- Professional ---                           Z80.0, Family history of malignant neoplasm of                            digestive organs                           D12.0, Benign neoplasm of cecum                           D12.2, Benign neoplasm of ascending colon                           D12.3, Benign neoplasm of transverse colon (hepatic                             flexure or splenic flexure)                           D12.5, Benign neoplasm of sigmoid colon                           K64.8, Other hemorrhoids                           K57.30, Diverticulosis of large intestine without                            perforation or abscess without bleeding                           Q43.8, Other specified congenital malformations of  intestine CPT copyright 2016 [employer]. All rights reserved. The codes documented in this report are preliminary and upon coder review may  be revised to meet current compliance requirements. Barney Drain, MD Barney Drain, MD 11/01/2016 10:57:48 AM This report has been signed electronically. Number of Addenda: 0

## 2016-11-01 NOTE — Anesthesia Preprocedure Evaluation (Signed)
Anesthesia Evaluation  Patient identified by MRN, date of birth, ID band Patient awake    Reviewed: Allergy & Precautions, NPO status , Patient's Chart, lab work & pertinent test results  Airway Mallampati: II  TM Distance: >3 FB     Dental  (+) Edentulous Upper, Edentulous Lower   Pulmonary former smoker,    breath sounds clear to auscultation       Cardiovascular hypertension, Pt. on medications  Rhythm:Regular Rate:Normal     Neuro/Psych    GI/Hepatic GERD  Medicated and Controlled,  Endo/Other  diabetes, Type 2Morbid obesity  Renal/GU      Musculoskeletal  (+) Arthritis ,   Abdominal   Peds  Hematology   Anesthesia Other Findings   Reproductive/Obstetrics                             Anesthesia Physical Anesthesia Plan  ASA: III  Anesthesia Plan: MAC   Post-op Pain Management:    Induction: Intravenous  Airway Management Planned: Simple Face Mask  Additional Equipment:   Intra-op Plan:   Post-operative Plan:   Informed Consent: I have reviewed the patients History and Physical, chart, labs and discussed the procedure including the risks, benefits and alternatives for the proposed anesthesia with the patient or authorized representative who has indicated his/her understanding and acceptance.     Plan Discussed with:   Anesthesia Plan Comments:         Anesthesia Quick Evaluation

## 2016-11-01 NOTE — Telephone Encounter (Signed)
I called Dr. Griffin Dakin office and spoke to dusty. Dr. Moshe Cipro does not have appts until April. Pt has been scheduled and OV with Dr. Meda Coffee on 11/08/2016 at 10:40 Am and to check potassium.  I called pt and LMOM for a return call.

## 2016-11-01 NOTE — Progress Notes (Signed)
Spoke to Dr. Oneida Alar office per Dr. Valinda Hoar. They are to make an apoointment with Dr. Moshe Cipro for Mrs. Narez's recheck of Potassium. Verbalized understanduing with patient and office.

## 2016-11-01 NOTE — Discharge Instructions (Signed)
I removed 10 polyps. I PLACED SEVEN CLIPS IN YOUR RIGHT COLON TO PREVENT BLEEDING IN 7-10 DAYS.  YOU HAVE diverticulosis IN YOUR RIGHT AND LEFT COLON. YOU HAVE A EXTREMELY FLOPPY LEFT COLON. IT WAS CHALLENGING TO COMPLETE YOUR COLONOSCOPY.   YOUR POTASSIUM IS LOW DUE TO YOUR MEDICINE: MAXZIDE. RECHECK POTASSIUM NEXT TUES MAR 27.  HOLD ASPIRIN. RE-START MAR 28.  NO MRI FOR 30 DAYS DUE TO METAL CLIP PLACEMENT IN THE COLON.  DRINK WATER TO KEEP YOUR URINE LIGHT YELLOW.  FOLLOW A HIGH FIBER DIET. AVOID ITEMS THAT CAUSE BLOATING. See info below.  CONTINUE YOUR WEIGHT LOSS EFFORTS.   USE PREPARATION H FOUR TIMES  A DAY IF NEEDED TO RELIEVE RECTAL PAIN/PRESSURE/BLEEDING.   YOUR BIOPSY RESULTS WILL BE AVAILABLE IN MY CHART  MAR 23 AND MY OFFICE WILL CONTACT YOU IN 10-14 DAYS WITH YOUR RESULTS.   Next colonoscopy in 1-3 years WITH PROPOFOL AND YOU WILL NEED TO HOLD ASPIRIN FOR 7 DAYS PRIOR TO YOUR COLONOSCOPY.. YOUR SISTERS, BROTHERS, CHILDREN, AND PARENTS NEED TO HAVE A COLONOSCOPY STARTING AT THE AGE OF 40.    Colonoscopy Care After Read the instructions outlined below and refer to this sheet in the next week. These discharge instructions provide you with general information on caring for yourself after you leave the hospital. While your treatment has been planned according to the most current medical practices available, unavoidable complications occasionally occur. If you have any problems or questions after discharge, call DR. Venita Seng, [phone].  ACTIVITY  You may resume your regular activity, but move at a slower pace for the next 24 hours.   Take frequent rest periods for the next 24 hours.   Walking will help get rid of the air and reduce the bloated feeling in your belly (abdomen).   No driving for 24 hours (because of the medicine (anesthesia) used during the test).   You may shower.   Do not sign any important legal documents or operate any machinery for 24 hours (because  of the anesthesia used during the test).    NUTRITION  Drink plenty of fluids.   You may resume your normal diet as instructed by your doctor.   Begin with a light meal and progress to your normal diet. Heavy or fried foods are harder to digest and may make you feel sick to your stomach (nauseated).   Avoid alcoholic beverages for 24 hours or as instructed.    MEDICATIONS  You may resume your normal medications.   WHAT YOU CAN EXPECT TODAY  Some feelings of bloating in the abdomen.   Passage of more gas than usual.   Spotting of blood in your stool or on the toilet paper  .  IF YOU HAD POLYPS REMOVED DURING THE COLONOSCOPY:  Eat a soft diet IF YOU HAVE NAUSEA, BLOATING, ABDOMINAL PAIN, OR VOMITING.    FINDING OUT THE RESULTS OF YOUR TEST Not all test results are available during your visit. DR. Oneida Alar WILL CALL YOU WITHIN 14 DAYS OF YOUR PROCEDUE WITH YOUR RESULTS. Do not assume everything is normal if you have not heard from DR. Tyniesha Howald, CALL HER OFFICE AT [phone].  SEEK IMMEDIATE MEDICAL ATTENTION AND CALL THE OFFICE: [phone] IF:  You have more than a spotting of blood in your stool.   Your belly is swollen (abdominal distention).   You are nauseated or vomiting.   You have a temperature over 101F.   You have abdominal pain or discomfort that is severe or  gets worse throughout the day.  High-Fiber Diet A high-fiber diet changes your normal diet to include more whole grains, legumes, fruits, and vegetables. Changes in the diet involve replacing refined carbohydrates with unrefined foods. The calorie level of the diet is essentially unchanged. The Dietary Reference Intake (recommended amount) for adult males is 38 grams per day. For adult females, it is 25 grams per day. Pregnant and lactating women should consume 28 grams of fiber per day. Fiber is the intact part of a plant that is not broken down during digestion. Functional fiber is fiber that has been  isolated from the plant to provide a beneficial effect in the body. PURPOSE  Increase stool bulk.   Ease and regulate bowel movements.   Lower cholesterol.  REDUCE RISK OF COLON CANCER  INDICATIONS THAT YOU NEED MORE FIBER  Constipation and hemorrhoids.   Uncomplicated diverticulosis (intestine condition) and irritable bowel syndrome.   Weight management.   As a protective measure against hardening of the arteries (atherosclerosis), diabetes, and cancer.   GUIDELINES FOR INCREASING FIBER IN THE DIET  Start adding fiber to the diet slowly. A gradual increase of about 5 more grams (2 slices of whole-wheat bread, 2 servings of most fruits or vegetables, or 1 bowl of high-fiber cereal) per day is best. Too rapid an increase in fiber may result in constipation, flatulence, and bloating.   Drink enough water and fluids to keep your urine clear or pale yellow. Water, juice, or caffeine-free drinks are recommended. Not drinking enough fluid may cause constipation.   Eat a variety of high-fiber foods rather than one type of fiber.   Try to increase your intake of fiber through using high-fiber foods rather than fiber pills or supplements that contain small amounts of fiber.   The goal is to change the types of food eaten. Do not supplement your present diet with high-fiber foods, but replace foods in your present diet.   INCLUDE A VARIETY OF FIBER SOURCES  Replace refined and processed grains with whole grains, canned fruits with fresh fruits, and incorporate other fiber sources. White rice, white breads, and most bakery goods contain little or no fiber.   Brown whole-grain rice, buckwheat oats, and many fruits and vegetables are all good sources of fiber. These include: broccoli, Brussels sprouts, cabbage, cauliflower, beets, sweet potatoes, white potatoes (skin on), carrots, tomatoes, eggplant, squash, berries, fresh fruits, and dried fruits.   Cereals appear to be the richest source  of fiber. Cereal fiber is found in whole grains and bran. Bran is the fiber-rich outer coat of cereal grain, which is largely removed in refining. In whole-grain cereals, the bran remains. In breakfast cereals, the largest amount of fiber is found in those with "bran" in their names. The fiber content is sometimes indicated on the label.   You may need to include additional fruits and vegetables each day.   In baking, for 1 cup white flour, you may use the following substitutions:   1 cup whole-wheat flour minus 2 tablespoons.   1/2 cup white flour plus 1/2 cup whole-wheat flour.   Polyps, Colon  A polyp is extra tissue that grows inside your body. Colon polyps grow in the large intestine. The large intestine, also called the colon, is part of your digestive system. It is a long, hollow tube at the end of your digestive tract where your body makes and stores stool. Most polyps are not dangerous. They are benign. This means they are not cancerous. But  over time, some types of polyps can turn into cancer. Polyps that are smaller than a pea are usually not harmful. But larger polyps could someday become or may already be cancerous. To be safe, doctors remove all polyps and test them.   PREVENTION There is not one sure way to prevent polyps. You might be able to lower your risk of getting them if you:  Eat more fruits and vegetables and less fatty food.   Do not smoke.   Avoid alcohol.   Exercise every day.   Lose weight if you are overweight.   Eating more calcium and folate can also lower your risk of getting polyps. Some foods that are rich in calcium are milk, cheese, and broccoli. Some foods that are rich in folate are chickpeas, kidney beans, and spinach.    Diverticulosis Diverticulosis is a common condition that develops when small pouches (diverticula) form in the wall of the colon. The risk of diverticulosis increases with age. It happens more often in people who eat a low-fiber  diet. Most individuals with diverticulosis have no symptoms. Those individuals with symptoms usually experience belly (abdominal) pain, constipation, or loose stools (diarrhea).  HOME CARE INSTRUCTIONS  Increase the amount of fiber in your diet as directed by your caregiver or dietician. This may reduce symptoms of diverticulosis.   Drink at least 6 to 8 glasses of water each day to prevent constipation.   Try not to strain when you have a bowel movement.   Avoiding nuts and seeds to prevent complications is NOT NECESSARY.     FOODS HAVING HIGH FIBER CONTENT INCLUDE:  Fruits. Apple, peach, pear, tangerine, raisins, prunes.   Vegetables. Brussels sprouts, asparagus, broccoli, cabbage, carrot, cauliflower, romaine lettuce, spinach, summer squash, tomato, winter squash, zucchini.   Starchy Vegetables. Baked beans, kidney beans, lima beans, split peas, lentils, potatoes (with skin).   Grains. Whole wheat bread, brown rice, bran flake cereal, plain oatmeal, white rice, shredded wheat, bran muffins.    SEEK IMMEDIATE MEDICAL CARE IF:  You develop increasing pain or severe bloating.   You have an oral temperature above 101F.   You develop vomiting or bowel movements that are bloody or black.   Hemorrhoids Hemorrhoids are dilated (enlarged) veins around the rectum. Sometimes clots will form in the veins. This makes them swollen and painful. These are called thrombosed hemorrhoids. Causes of hemorrhoids include:  Constipation.   Straining to have a bowel movement.   HEAVY LIFTING  HOME CARE INSTRUCTIONS  Eat a well balanced diet and drink 6 to 8 glasses of water every day to avoid constipation. You may also use a bulk laxative.   Avoid straining to have bowel movements.   Keep anal area dry and clean.   Do not use a donut shaped pillow or sit on the toilet for long periods. This increases blood pooling and pain.   Move your bowels when your body has the urge; this will  require less straining and will decrease pain and pressure.

## 2016-11-01 NOTE — Transfer of Care (Signed)
Immediate Anesthesia Transfer of Care Note  Patient: Sarah Coleman  Procedure(s) Performed: Procedure(s) with comments: COLONOSCOPY WITH PROPOFOL (N/A) - 830  POLYPECTOMY - colon  Patient Location: PACU  Anesthesia Type:MAC  Level of Consciousness: awake and patient cooperative  Airway & Oxygen Therapy: Patient Spontanous Breathing and Patient connected to face mask oxygen  Post-op Assessment: Report given to RN, Post -op Vital signs reviewed and stable and Patient moving all extremities  Post vital signs: Reviewed and stable  Last Vitals:  Vitals:   11/01/16 0810 11/01/16 0815  BP: 125/85 122/81  Pulse:    Resp: 13 12  Temp:      Last Pain:  Vitals:   11/01/16 0716  TempSrc: Oral  PainSc: 3          Complications: No apparent anesthesia complications

## 2016-11-01 NOTE — Anesthesia Postprocedure Evaluation (Signed)
Anesthesia Post Note  Patient: Sarah Coleman  Procedure(s) Performed: Procedure(s) (LRB): COLONOSCOPY WITH PROPOFOL (N/A) POLYPECTOMY  Patient location during evaluation: PACU Anesthesia Type: MAC Level of consciousness: awake and patient cooperative Pain management: pain level controlled Vital Signs Assessment: post-procedure vital signs reviewed and stable Respiratory status: spontaneous breathing, nonlabored ventilation and respiratory function stable Cardiovascular status: blood pressure returned to baseline Postop Assessment: no signs of nausea or vomiting Anesthetic complications: no     Last Vitals:  Vitals:   11/01/16 0810 11/01/16 0815  BP: 125/85 122/81  Pulse:    Resp: 13 12  Temp:      Last Pain:  Vitals:   11/01/16 0716  TempSrc: Oral  PainSc: 3                  Jandel Patriarca J

## 2016-11-01 NOTE — Telephone Encounter (Signed)
Lattie Haw, RN at Short Stay called to say that [facility] wanted [place] to make OV for next Tuesday March 27 at Dr Simpson's office to have patient's Potassium checked.

## 2016-11-01 NOTE — H&P (Signed)
Primary Care Physician:  Tula Nakayama, MD Primary Gastroenterologist:  Dr. Oneida Alar  Pre-Procedure History & Physical: HPI:  Sarah Coleman is a 66 y.o. female here for [place].  Past Medical History:  Diagnosis Date  . Allergic rhinitis   . Allergy   . Anemia   . Arthritis   . GERD (gastroesophageal reflux disease) 2013  . Hyperlipidemia   . Hypertension   . Metabolic syndrome X 5784  . Morbid obesity ([place]) 2000  . Obesity   . Prediabetes 2010    Past Surgical History:  Procedure Laterality Date  . COLONOSCOPY N/A 10/07/2016   Procedure: COLONOSCOPY;  Surgeon: Danie Binder, MD;  Location: [facility];  Service: Endoscopy;  Laterality: N/A;  9:30 AM  . GERD    . PARTIAL HYSTERECTOMY  1987   secondary to ovarian cyst   . TUBAL LIGATION  1975    Prior to Admission medications   Medication Sig Start Date End Date Taking? Authorizing Provider  acetaminophen (TYLENOL) 325 MG tablet Take 325-650 mg by mouth every 6 (six) hours as needed (for pain/headaches.).   Yes Historical Provider, MD  aspirin EC 81 MG tablet Take 81 mg by mouth daily at 6 PM.  07/22/13  Yes Fayrene Helper, MD  azelastine (ASTELIN) 0.1 % nasal spray Place 1 spray into both nostrils 2 (two) times daily. Use in each nostril as directed 02/09/16  Yes Fayrene Helper, MD  CALCIUM-MAGNESIUM-ZINC PO Take 1 tablet by mouth 3 (three) times daily.    Yes Historical Provider, MD  carboxymethylcellulose (REFRESH PLUS) 0.5 % SOLN Place 1-2 drops into both eyes 3 (three) times daily as needed (for dry eyes).   Yes Historical Provider, MD  cholecalciferol (VITAMIN D) 1000 units tablet Take 1,000 Units by mouth daily.   Yes Historical Provider, MD  cyclobenzaprine (FLEXERIL) 10 MG tablet TAKE ONE TABLET BY MOUTH AT BEDTIME Patient taking differently: TAKE ONE TABLET BY MOUTH AT BEDTIME AS NEEDED FOR BACK OR NECK SPASMS/PAIN 07/06/16  Yes Raylene Everts, MD  EQ ALLERGY RELIEF 10 MG tablet TAKE ONE  TABLET BY MOUTH ONCE DAILY Patient taking differently: TAKE ONE TABLET BY MOUTH ONCE DAILY IN THE MORNING 10/24/16  Yes Fayrene Helper, MD  estradiol (ESTRACE) 0.1 MG/GM vaginal cream Place 1 Applicatorful vaginally every other day as needed (for vaginal discomfort).    Yes Historical Provider, MD  fluticasone (FLONASE) 50 MCG/ACT nasal spray USE TWO SPRAY(S) IN EACH NOSTRIL ONCE DAILY Patient taking differently: USE TWO SPRAY(S) IN EACH NOSTRIL ONCE DAILY IN THE EVENING 08/25/16  Yes Fayrene Helper, MD  Iron-Vitamins (GERITOL PO) Take 1 tablet by mouth daily.   Yes Historical Provider, MD  lovastatin (MEVACOR) 20 MG tablet TAKE ONE TABLET BY MOUTH AT BEDTIME 08/27/16  Yes Fayrene Helper, MD  meclizine (ANTIVERT) 12.5 MG tablet TAKE ONE TABLET BY MOUTH THREE TIMES DAILY AS NEEDED FOR DIZZINESS Patient taking differently: TAKE ONE TABLET BY MOUTH THREE TIMES DAILY AS NEEDED FOR DIZZINESS/VERTIGO 06/24/16  Yes Fayrene Helper, MD  Omega-3 Fatty Acids (OMEGA-3 FISH OIL PO) Take 3 tablets by mouth daily. Omega xl    Yes Historical Provider, MD  pantoprazole (PROTONIX) 20 MG tablet TAKE ONE TABLET BY MOUTH ONCE DAILY 05/24/16  Yes Fayrene Helper, MD  polyethylene glycol-electrolytes (TRILYTE) 420 g solution Take 4,000 mLs by mouth as directed. 10/11/16  Yes Danie Binder, MD  potassium chloride (K-DUR) 10 MEQ tablet TAKE ONE TABLET  BY MOUTH TWICE DAILY 08/25/16  Yes Fayrene Helper, MD  ranitidine (ZANTAC) 150 MG capsule Take 150 mg by mouth as needed for heartburn. Reported on 10/07/2015   Yes Historical Provider, MD  triamterene-hydrochlorothiazide (MAXZIDE) 75-50 MG tablet Take 1 tablet by mouth daily. 02/09/16  Yes Fayrene Helper, MD  fluconazole (DIFLUCAN) 150 MG tablet TAKE 1 TABLET BY MOUTH ONCE A WEEK, REPEAT IN ONE WEEK Patient taking differently: TAKE 1 TABLET BY MOUTH ONCE A WEEK AS NEEDED FOR YEAST INFECTION. 10/19/16   Fayrene Helper, MD  levofloxacin (LEVAQUIN) 500 MG  tablet Take 1 tablet (500 mg total) by mouth daily. Patient not taking: Reported on 10/24/2016 10/03/16   Raylene Everts, MD  naproxen (NAPROSYN) 500 MG tablet Take 1 tablet (500 mg total) by mouth 2 (two) times daily. 04/23/16   Evalee [place], PA-C  Potassium Chloride ER 20 MEQ TBCR TAKE TWO PILL TWICE TODAY. DOSES SHOULD BE 4 HRS APART AND THEN 2 PO DAILY FOR 3 DAYS 10/26/16   Danie Binder, MD  predniSONE (STERAPRED UNI-PAK 21 TAB) 5 MG (21) TBPK tablet Take 1 tablet (5 mg total) by mouth as directed. Use as directed Patient not taking: Reported on 10/24/2016 10/10/16   Fayrene Helper, MD    Allergies as of 10/11/2016 - Review Complete 10/07/2016  Allergen Reaction Noted  . Ibuprofen  12/06/2007  . Penicillins  12/06/2007    Family History  Problem Relation Age of Onset  . Heart attack Mother   . Heart disease Mother 47    massive heart attack  . Prostate cancer Father   . Cancer Father 66    colon  . Hypertension Sister   . Ovarian cancer Sister   . Cancer Brother 36    oral   . Hypertension Brother   . Heart disease Brother     Social History   Social History  . Marital status: Married    Spouse name: N/A  . Number of children: 5  . Years of education: N/A   Occupational History  . retired     Social History Main Topics  . Smoking status: Former Smoker    Packs/day: 1.00    Years: 3.00    Quit date: 08/15/1974  . Smokeless tobacco: Never Used  . Alcohol use No  . Drug use: No  . Sexual activity: Yes    Birth control/ protection: Surgical   Other Topics Concern  . Not on file   Social History Narrative  . No narrative on file    Review of Systems: See HPI, otherwise negative ROS   Physical Exam: BP 122/87   Pulse 91   Temp 98.1 F (36.7 C) (Oral)   Resp 16   Ht 5\' 6"  (1.676 m)   Wt 227 lb (103 kg)   SpO2 95%   BMI 36.64 kg/m  General:   Alert,  pleasant and cooperative in NAD Head:  Normocephalic and atraumatic. Neck:  Supple; Lungs:  Clear  throughout to auscultation.    Heart:  Regular rate and rhythm. Abdomen:  Soft, nontender and nondistended. Normal bowel sounds, without guarding, and without rebound.   Neurologic:  Alert and  oriented x4;  grossly normal neurologically.  Impression/Plan:     SCREENING  Plan:  1. TCS TODAY. DISCUSSED PROCEDURE, BENEFITS, & RISKS: < 1% chance of medication reaction, bleeding, perforation, or rupture of spleen/liver.

## 2016-11-01 NOTE — Telephone Encounter (Signed)
PT called and is aware.

## 2016-11-03 ENCOUNTER — Telehealth: Payer: Self-pay | Admitting: Family Medicine

## 2016-11-03 LAB — POCT I-STAT 4, (NA,K, GLUC, HGB,HCT)
Glucose, Bld: 125 mg/dL — ABNORMAL HIGH (ref 65–99)
HCT: 35 % — ABNORMAL LOW (ref 36.0–46.0)
Hemoglobin: 11.9 g/dL — ABNORMAL LOW (ref 12.0–15.0)
POTASSIUM: 3 mmol/L — AB (ref 3.5–5.1)
Sodium: 136 mmol/L (ref 135–145)

## 2016-11-03 NOTE — Telephone Encounter (Signed)
pls let her know I have noted low potassium on two recent labs, needs rx , I have entered pls send after you speak wih her

## 2016-11-04 ENCOUNTER — Encounter (HOSPITAL_COMMUNITY): Payer: Self-pay | Admitting: Gastroenterology

## 2016-11-04 ENCOUNTER — Other Ambulatory Visit: Payer: Self-pay | Admitting: Family Medicine

## 2016-11-04 ENCOUNTER — Other Ambulatory Visit: Payer: Self-pay

## 2016-11-04 MED ORDER — POTASSIUM CHLORIDE CRYS ER 20 MEQ PO TBCR: 20.0000 meq | EXTENDED_RELEASE_TABLET | Freq: Two times a day (BID) | ORAL | 3 refills | Status: DC

## 2016-11-04 NOTE — Telephone Encounter (Signed)
Sorry about that, she needs to take the dose Dr. Oneida Alar sent in, and we need to find out if she has actually been taking the 10 meq twice daily, if she has been I will need to increase the dose, if she has not been she needs to take it, start after the 20 meq load that Dr Oneida Alar sent   Pls , send me back info once you are able to get it , since there is confusion

## 2016-11-04 NOTE — Telephone Encounter (Signed)
I have printed dose increase,pls send  I see she has f/u next week with r Meda Coffee, recommend rept chem 7 on her new dose 4/10 if none ordered at that visit

## 2016-11-04 NOTE — Telephone Encounter (Signed)
She took all of Dr Oneida Alar prescription and was told after she finished to go back to your prescription of 39meq bid and she has been taking those on a regular basis. I told her to check with the pharmacy after lunch for her new dose.

## 2016-11-04 NOTE — Telephone Encounter (Signed)
I'm confused. There are 3 potassiums on her list. Most recent was on 3/14 that was sent in by Dr Oneida Alar. Looks like she is already on the dose that you sent in. 51meq bid. Did you mean to increase it?

## 2016-11-07 ENCOUNTER — Other Ambulatory Visit: Payer: Self-pay | Admitting: Family Medicine

## 2016-11-08 ENCOUNTER — Ambulatory Visit (INDEPENDENT_AMBULATORY_CARE_PROVIDER_SITE_OTHER): Payer: PPO | Admitting: Family Medicine

## 2016-11-08 ENCOUNTER — Encounter: Payer: Self-pay | Admitting: Family Medicine

## 2016-11-08 VITALS — BP 130/84 | HR 88 | Temp 97.5°F | Resp 18 | Ht 66.0 in | Wt 227.1 lb

## 2016-11-08 DIAGNOSIS — E876 Hypokalemia: Secondary | ICD-10-CM | POA: Diagnosis not present

## 2016-11-08 DIAGNOSIS — D126 Benign neoplasm of colon, unspecified: Secondary | ICD-10-CM | POA: Diagnosis not present

## 2016-11-08 LAB — BASIC METABOLIC PANEL
BUN: 8 mg/dL (ref 7–25)
CHLORIDE: 100 mmol/L (ref 98–110)
CO2: 29 mmol/L (ref 20–31)
CREATININE: 0.98 mg/dL (ref 0.50–0.99)
Calcium: 9.4 mg/dL (ref 8.6–10.4)
Glucose, Bld: 96 mg/dL (ref 65–99)
POTASSIUM: 3.6 mmol/L (ref 3.5–5.3)
Sodium: 137 mmol/L (ref 135–146)

## 2016-11-08 NOTE — Patient Instructions (Addendum)
Eat potassium foods  Take medicine as prescribed Avoid laxatives High fiber diet Take metamucil ( or generic) daily for fiber Continue to drink water   Hypokalemia   Eat more foods that contain a lot of potassium, such as:  Nuts, such as peanuts and pistachios.  Seeds, such as sunflower seeds and pumpkin seeds.  Peas, lentils, and lima beans.  Whole grain and bran cereals and breads.  Fresh fruits and vegetables, such as apricots, avocado, bananas, cantaloupe, kiwi, oranges, tomatoes, asparagus, and potatoes.  Orange juice.  Tomato juice.  Red meats. (limit)  Yogurt.  .  High-Fiber Diet Fiber, also called dietary fiber, is a type of carbohydrate found in fruits, vegetables, whole grains, and beans. A high-fiber diet can have many health benefits. Your health care provider may recommend a high-fiber diet to help:  Prevent constipation. Fiber can make your bowel movements more regular.  Lower your cholesterol.  Relieve hemorrhoids, uncomplicated diverticulosis, or irritable bowel syndrome.  Prevent overeating as part of a weight-loss plan.  Prevent heart disease, type 2 diabetes, and certain cancers. What is my plan? The recommended daily intake of fiber includes:  38 grams for men under age 50.  17 grams for men over age 39.  39 grams for women under age 50.  28 grams for women over age 66. You can get the recommended daily intake of dietary fiber by eating a variety of fruits, vegetables, grains, and beans. Your health care provider may also recommend a fiber supplement if it is not possible to get enough fiber through your diet. What do I need to know about a high-fiber diet?  Fiber supplements have not been widely studied for their effectiveness, so it is better to get fiber through food sources.  Always check the fiber content on thenutrition facts label of any prepackaged food. Look for foods that contain at least 5 grams of fiber per serving.  Ask  your dietitian if you have questions about specific foods that are related to your condition, especially if those foods are not listed in the following section.  Increase your daily fiber consumption gradually. Increasing your intake of dietary fiber too quickly may cause bloating, cramping, or gas.  Drink plenty of water. Water helps you to digest fiber. What foods can I eat? Grains  Whole-grain breads. Multigrain cereal. Oats and oatmeal. Brown rice. Barley. Bulgur wheat. [place]. Bran muffins. Popcorn. Rye wafer crackers. Vegetables  Sweet potatoes. Spinach. Kale. Artichokes. Cabbage. Broccoli. Green peas. Carrots. Squash. Fruits  Berries. Pears. Apples. Oranges. Avocados. Prunes and raisins. Dried figs. Meats and Other Protein Sources  [place], kidney, pinto, and soy beans. Split peas. Lentils. Nuts and seeds. Dairy  Fiber-fortified yogurt. Beverages  Fiber-fortified soy milk. Fiber-fortified orange juice. Other  Fiber bars. The items listed above may not be a complete list of recommended foods or beverages. Contact your dietitian for more options.  What foods are not recommended? Grains  White bread. Pasta made with refined flour. White rice. Vegetables  Fried potatoes. Canned vegetables. Well-cooked vegetables. Fruits  Fruit juice. Cooked, strained fruit. Meats and Other Protein Sources  Fatty cuts of meat. Fried Sales executive or fried fish. Dairy  Milk. Yogurt. Cream cheese. Sour cream. Beverages  Soft drinks. Other  Cakes and pastries. Butter and oils. The items listed above may not be a complete list of foods and beverages to avoid. Contact your dietitian for more information.  What are some tips for including high-fiber foods in my diet?  Eat a wide  variety of high-fiber foods.  Make sure that half of all grains consumed each day are whole grains.  Replace breads and cereals made from refined flour or white flour with whole-grain breads and cereals.  Replace white rice  with brown rice, bulgur wheat, or millet.  Start the day with a breakfast that is high in fiber, such as a cereal that contains at least 5 grams of fiber per serving.  Use beans in place of meat in soups, salads, or pasta.  Eat high-fiber snacks, such as berries, raw vegetables, nuts, or popcorn. This information is not intended to replace advice given to you by your health care provider. Make sure you discuss any questions you have with your health care provider. Document Released: 08/01/2005 Document Revised: 01/07/2016 Document Reviewed: 01/14/2014 Elsevier Interactive Patient Education  2017 [employer].

## 2016-11-08 NOTE — Progress Notes (Signed)
Chief Complaint  Patient presents with  . Follow-up   Patient is here for follow-up. She had a colonoscopy recently. In her pre-colonoscopy blood work she was found to be hypokalemic. Her potassium was increased and she is here today to have her potassium rechecked. She also would like her colonoscopy results. She has colon cancer in her family. Her colonoscopy was positive for 10 polyps. Polyps were tubular adenomas. She also had melanosis coli, diverticulosis, redundant colon, and hemorrhoids. I discussed with her the importance of drinking enough water and eating a high-fiber diet. She also should take a fiber supplement such as Metamucil once or twice a day. I advised her to not use laxatives. She will need another colonoscopy soon (1-3 years). Patient feels well. No abdominal pain, rectal bleeding, constipation or diarrhea since her procedure.   Patient Active Problem List   Diagnosis Date Noted  . Polyp of colon   . Family hx of colon cancer   . Annual physical exam 06/04/2015  . Nonspecific abnormal electrocardiogram (ECG) (EKG) 12/22/2014  . Metabolic syndrome X 38/[id number]/7342  . Prediabetes 04/05/2013  . GERD (gastroesophageal reflux disease) 11/01/2012  . Hyperlipidemia LDL goal <100 06/09/2008  . Episodic recurrent vertigo 12/07/2007  . Morbid obesity ([place]) 12/06/2007  . Essential hypertension 12/06/2007  . Seasonal allergies 12/06/2007    Outpatient Encounter Prescriptions as of 11/08/2016  Medication Sig  . acetaminophen (TYLENOL) 325 MG tablet Take 325-650 mg by mouth every 6 (six) hours as needed (for pain/headaches.).  Marland Kitchen azelastine (ASTELIN) 0.1 % nasal spray Place 1 spray into both nostrils 2 (two) times daily. Use in each nostril as directed  . CALCIUM-MAGNESIUM-ZINC PO Take 1 tablet by mouth 3 (three) times daily.   . carboxymethylcellulose (REFRESH PLUS) 0.5 % SOLN Place 1-2 drops into both eyes 3 (three) times daily as needed (for dry eyes).  . cholecalciferol  (VITAMIN D) 1000 units tablet Take 1,000 Units by mouth daily.  . cyclobenzaprine (FLEXERIL) 10 MG tablet TAKE ONE TABLET BY MOUTH AT BEDTIME (Patient taking differently: TAKE ONE TABLET BY MOUTH AT BEDTIME AS NEEDED FOR BACK OR NECK SPASMS/PAIN)  . EQ ALLERGY RELIEF 10 MG tablet TAKE ONE TABLET BY MOUTH ONCE DAILY (Patient taking differently: TAKE ONE TABLET BY MOUTH ONCE DAILY IN THE MORNING)  . estradiol (ESTRACE) 0.1 MG/GM vaginal cream Place 1 Applicatorful vaginally every other day as needed (for vaginal discomfort).   . fluticasone (FLONASE) 50 MCG/ACT nasal spray USE TWO SPRAY(S) IN EACH NOSTRIL ONCE DAILY (Patient taking differently: USE TWO SPRAY(S) IN EACH NOSTRIL ONCE DAILY IN THE EVENING)  . Iron-Vitamins (GERITOL PO) Take 1 tablet by mouth daily.  Marland Kitchen lovastatin (MEVACOR) 20 MG tablet TAKE ONE TABLET BY MOUTH AT BEDTIME  . meclizine (ANTIVERT) 12.5 MG tablet TAKE ONE TABLET BY MOUTH THREE TIMES DAILY AS NEEDED FOR DIZZINESS (Patient taking differently: TAKE ONE TABLET BY MOUTH THREE TIMES DAILY AS NEEDED FOR DIZZINESS/VERTIGO)  . Omega-3 Fatty Acids (OMEGA-3 FISH OIL PO) Take 3 tablets by mouth daily. Omega xl   . pantoprazole (PROTONIX) 20 MG tablet TAKE ONE TABLET BY MOUTH ONCE DAILY  . potassium chloride SA (K-DUR,KLOR-CON) 20 MEQ tablet Take 1 tablet (20 mEq total) by mouth 2 (two) times daily.  . ranitidine (ZANTAC) 150 MG capsule Take 150 mg by mouth as needed for heartburn. Reported on 10/07/2015  . triamterene-hydrochlorothiazide (MAXZIDE) 75-50 MG tablet TAKE ONE TABLET BY MOUTH ONCE DAILY   No facility-administered encounter medications on file as of  11/08/2016.     Allergies  Allergen Reactions  . Cinnamon Swelling  . Ibuprofen Swelling  . Penicillins Nausea And Vomiting    Has patient had a PCN reaction causing immediate rash, facial/tongue/throat swelling, SOB or lightheadedness with hypotension:No Has patient had a PCN reaction causing severe rash involving mucus  membranes or skin necrosis:No Has patient had a PCN reaction that required hospitalization:No Has patient had a PCN reaction occurring within the last 10 years:No If all of the above answers are "NO", then may proceed with Cephalosporin use.   Marland Kitchen Sweet Potato Swelling    Review of Systems  Constitutional: Negative for activity change, appetite change and fatigue.  HENT: Negative.  Negative for congestion.   Eyes: Negative.  Negative for visual disturbance.  Respiratory: Negative.  Negative for cough and shortness of breath.   Gastrointestinal: Negative for abdominal pain, blood in stool, constipation and diarrhea.  Genitourinary: Negative for dysuria and frequency.  Musculoskeletal: Negative.  Negative for arthralgias and myalgias.    BP 130/84 (BP Location: Right Arm, Patient Position: Sitting, Cuff Size: Normal)   Pulse 88   Temp 97.5 F (36.4 C) (Temporal)   Resp 18   Ht 5\' 6"  (1.676 m)   Wt 227 lb 1.9 oz (103 kg)   SpO2 98%   BMI 36.66 kg/m   Physical Exam  Constitutional: She is oriented to person, place, and time. She appears well-developed and well-nourished. No distress.  HENT:  Head: Normocephalic and atraumatic.  Mouth/Throat: Oropharynx is clear and moist.  Eyes: Conjunctivae are normal. Pupils are equal, round, and reactive to light.  Cardiovascular: Normal rate, regular rhythm and normal heart sounds.   Pulmonary/Chest: Effort normal and breath sounds normal.  Abdominal: Soft. Bowel sounds are normal. There is no tenderness.  Neurological: She is alert and oriented to person, place, and time.  Skin:  vitiligo  Psychiatric: She has a normal mood and affect. Her behavior is normal.  Nursing note and vitals reviewed.   ASSESSMENT/PLAN:  1. Hypokalemia  - Basic metabolic panel  2. Tubular adenoma of colon    Patient Instructions  Eat potassium foods  Take medicine as prescribed Avoid laxatives High fiber diet Take metamucil ( or generic) daily for  fiber Continue to drink water   Hypokalemia   Eat more foods that contain a lot of potassium, such as:  Nuts, such as peanuts and pistachios.  Seeds, such as sunflower seeds and pumpkin seeds.  Peas, lentils, and lima beans.  Whole grain and bran cereals and breads.  Fresh fruits and vegetables, such as apricots, avocado, bananas, cantaloupe, kiwi, oranges, tomatoes, asparagus, and potatoes.  Orange juice.  Tomato juice.  Red meats. (limit)  Yogurt.  .  High-Fiber Diet Fiber, also called dietary fiber, is a type of carbohydrate found in fruits, vegetables, whole grains, and beans. A high-fiber diet can have many health benefits. Your health care provider may recommend a high-fiber diet to help:  Prevent constipation. Fiber can make your bowel movements more regular.  Lower your cholesterol.  Relieve hemorrhoids, uncomplicated diverticulosis, or irritable bowel syndrome.  Prevent overeating as part of a weight-loss plan.  Prevent heart disease, type 2 diabetes, and certain cancers. What is my plan? The recommended daily intake of fiber includes:  38 grams for men under age 43.  42 grams for men over age 74.  57 grams for women under age 68.  25 grams for women over age 9. You can get the recommended daily intake of  dietary fiber by eating a variety of fruits, vegetables, grains, and beans. Your health care provider may also recommend a fiber supplement if it is not possible to get enough fiber through your diet. What do I need to know about a high-fiber diet?  Fiber supplements have not been widely studied for their effectiveness, so it is better to get fiber through food sources.  Always check the fiber content on thenutrition facts label of any prepackaged food. Look for foods that contain at least 5 grams of fiber per serving.  Ask your dietitian if you have questions about specific foods that are related to your condition, especially if those foods are  not listed in the following section.  Increase your daily fiber consumption gradually. Increasing your intake of dietary fiber too quickly may cause bloating, cramping, or gas.  Drink plenty of water. Water helps you to digest fiber. What foods can I eat? Grains  Whole-grain breads. Multigrain cereal. Oats and oatmeal. Brown rice. Barley. Bulgur wheat. [place]. Bran muffins. Popcorn. Rye wafer crackers. Vegetables  Sweet potatoes. Spinach. Kale. Artichokes. Cabbage. Broccoli. Green peas. Carrots. Squash. Fruits  Berries. Pears. Apples. Oranges. Avocados. Prunes and raisins. Dried figs. Meats and Other Protein Sources  [place], kidney, pinto, and soy beans. Split peas. Lentils. Nuts and seeds. Dairy  Fiber-fortified yogurt. Beverages  Fiber-fortified soy milk. Fiber-fortified orange juice. Other  Fiber bars. The items listed above may not be a complete list of recommended foods or beverages. Contact your dietitian for more options.  What foods are not recommended? Grains  White bread. Pasta made with refined flour. White rice. Vegetables  Fried potatoes. Canned vegetables. Well-cooked vegetables. Fruits  Fruit juice. Cooked, strained fruit. Meats and Other Protein Sources  Fatty cuts of meat. Fried Sales executive or fried fish. Dairy  Milk. Yogurt. Cream cheese. Sour cream. Beverages  Soft drinks. Other  Cakes and pastries. Butter and oils. The items listed above may not be a complete list of foods and beverages to avoid. Contact your dietitian for more information.  What are some tips for including high-fiber foods in my diet?  Eat a wide variety of high-fiber foods.  Make sure that half of all grains consumed each day are whole grains.  Replace breads and cereals made from refined flour or white flour with whole-grain breads and cereals.  Replace white rice with brown rice, bulgur wheat, or millet.  Start the day with a breakfast that is high in fiber, such as a cereal that  contains at least 5 grams of fiber per serving.  Use beans in place of meat in soups, salads, or pasta.  Eat high-fiber snacks, such as berries, raw vegetables, nuts, or popcorn. This information is not intended to replace advice given to you by your health care provider. Make sure you discuss any questions you have with your health care provider. Document Released: 08/01/2005 Document Revised: 01/07/2016 Document Reviewed: 01/14/2014 Elsevier Interactive Patient Education  2017 [employer]    Raylene Everts, MD

## 2016-11-09 ENCOUNTER — Encounter: Payer: Self-pay | Admitting: Family Medicine

## 2016-11-28 ENCOUNTER — Other Ambulatory Visit: Payer: Self-pay | Admitting: Family Medicine

## 2016-11-29 DIAGNOSIS — H1013 Acute atopic conjunctivitis, bilateral: Secondary | ICD-10-CM | POA: Diagnosis not present

## 2016-11-29 DIAGNOSIS — H40033 Anatomical narrow angle, bilateral: Secondary | ICD-10-CM | POA: Diagnosis not present

## 2016-12-07 ENCOUNTER — Telehealth: Payer: Self-pay

## 2016-12-07 NOTE — Telephone Encounter (Signed)
pt called asking if she should restart her asa?

## 2016-12-07 NOTE — Telephone Encounter (Signed)
Called Sarah Coleman, aware to resume asa.

## 2016-12-07 NOTE — Telephone Encounter (Signed)
Yes resume aspirin 81 mg one daily pls let her know

## 2016-12-10 ENCOUNTER — Other Ambulatory Visit: Payer: Self-pay | Admitting: Family Medicine

## 2016-12-14 ENCOUNTER — Ambulatory Visit: Payer: PPO | Admitting: Family Medicine

## 2016-12-14 ENCOUNTER — Encounter: Payer: Self-pay | Admitting: Family Medicine

## 2016-12-15 DIAGNOSIS — E785 Hyperlipidemia, unspecified: Secondary | ICD-10-CM | POA: Diagnosis not present

## 2016-12-15 DIAGNOSIS — R7303 Prediabetes: Secondary | ICD-10-CM | POA: Diagnosis not present

## 2016-12-15 DIAGNOSIS — E559 Vitamin D deficiency, unspecified: Secondary | ICD-10-CM | POA: Diagnosis not present

## 2016-12-15 DIAGNOSIS — I10 Essential (primary) hypertension: Secondary | ICD-10-CM | POA: Diagnosis not present

## 2016-12-16 LAB — COMPREHENSIVE METABOLIC PANEL
ALBUMIN: 4.3 g/dL (ref 3.6–5.1)
ALT: 20 U/L (ref 6–29)
AST: 24 U/L (ref 10–35)
Alkaline Phosphatase: 58 U/L (ref 33–130)
BUN: 12 mg/dL (ref 7–25)
CALCIUM: 9.8 mg/dL (ref 8.6–10.4)
CHLORIDE: 102 mmol/L (ref 98–110)
CO2: 28 mmol/L (ref 20–31)
CREATININE: 1.03 mg/dL — AB (ref 0.50–0.99)
GLUCOSE: 111 mg/dL — AB (ref 65–99)
Potassium: 3.6 mmol/L (ref 3.5–5.3)
SODIUM: 139 mmol/L (ref 135–146)
Total Bilirubin: 0.5 mg/dL (ref 0.2–1.2)
Total Protein: 7.1 g/dL (ref 6.1–8.1)

## 2016-12-16 LAB — HEMOGLOBIN A1C
Hgb A1c MFr Bld: 6 % — ABNORMAL HIGH (ref <5.7)
MEAN PLASMA GLUCOSE: 126 mg/dL

## 2016-12-16 LAB — LIPID PANEL
CHOL/HDL RATIO: 5.3 ratio — AB (ref <5.0)
CHOLESTEROL: 185 mg/dL (ref <200)
HDL: 35 mg/dL — ABNORMAL LOW (ref >50)
LDL Cholesterol: 74 mg/dL (ref <100)
Triglycerides: 382 mg/dL — ABNORMAL HIGH (ref <150)
VLDL: 76 mg/dL — ABNORMAL HIGH (ref <30)

## 2016-12-16 LAB — VITAMIN D 25 HYDROXY (VIT D DEFICIENCY, FRACTURES): VIT D 25 HYDROXY: 49 ng/mL (ref 30–100)

## 2016-12-20 ENCOUNTER — Ambulatory Visit (INDEPENDENT_AMBULATORY_CARE_PROVIDER_SITE_OTHER): Payer: PPO | Admitting: Family Medicine

## 2016-12-20 ENCOUNTER — Encounter: Payer: Self-pay | Admitting: Family Medicine

## 2016-12-20 VITALS — BP 140/80 | HR 96 | Temp 98.2°F | Resp 18 | Ht 66.0 in | Wt 219.0 lb

## 2016-12-20 DIAGNOSIS — I10 Essential (primary) hypertension: Secondary | ICD-10-CM

## 2016-12-20 DIAGNOSIS — R7303 Prediabetes: Secondary | ICD-10-CM

## 2016-12-20 DIAGNOSIS — K219 Gastro-esophageal reflux disease without esophagitis: Secondary | ICD-10-CM

## 2016-12-20 DIAGNOSIS — E8881 Metabolic syndrome: Secondary | ICD-10-CM | POA: Diagnosis not present

## 2016-12-20 DIAGNOSIS — E785 Hyperlipidemia, unspecified: Secondary | ICD-10-CM

## 2016-12-20 NOTE — Patient Instructions (Addendum)
Annual Physical exam in early October, call if you need me sooner  Fasting lipid, cmp and EGFR and hBa1C , TSH 1 week before next visit  Please cut back on fried and fatty foods, esp cookies, b blood sugar and triglycerides have increased  Blood pressure needs to be lower, cut back on canned foods and salt  It is important that you exercise regularly at least 30 minutes 5 times a week. If you develop chest pain, have severe difficulty breathing, or feel very tired, stop exercising immediately and seek medical attention    Please work on good  health habits so that your health will improve. 1. Commitment to daily physical activity for 30 to 60  minutes, if you are able to do this.  2. Commitment to wise food choices. Aim for half of your  food intake to be vegetable and fruit, one quarter starchy foods, and one quarter protein. Try to eat on a regular schedule  3 meals per day, snacking between meals should be limited to vegetables or fruits or small portions of nuts. 64 ounces of water per day is generally recommended, unless you have specific health conditions, like heart failure or kidney failure where you will need to limit fluid intake.  3. Commitment to sufficient and a  good quality of physical and mental rest daily, generally between 6 to 8 hours per day.  WITH PERSISTANCE AND PERSEVERANCE, THE IMPOSSIBLE , BECOMES THE NORM!

## 2016-12-21 ENCOUNTER — Encounter: Payer: Self-pay | Admitting: Family Medicine

## 2016-12-21 NOTE — Assessment & Plan Note (Signed)
Patient educated about the importance of limiting  Carbohydrate intake , the need to commit to daily physical activity for a minimum of 30 minutes , and to commit weight loss. The fact that changes in all these areas will reduce or eliminate all together the development of diabetes is stressed.  Deteriorated  Diabetic Labs Latest Ref Rng & Units 12/15/2016 11/08/2016 10/26/2016 07/27/2016 02/08/2016  HbA1c <5.7 % 6.0(H) - - 5.7(H) 5.9(H)  Chol <200 mg/dL 185 - - 184 192  HDL >50 mg/dL 35(L) - - 37(L) 41(L)  Calc LDL <100 mg/dL 74 - - 78 88  Triglycerides <150 mg/dL 382(H) - - 346(H) 316(H)  Creatinine 0.50 - 0.99 mg/dL 1.03(H) 0.98 1.00 0.99 0.92   BP/Weight 12/20/2016 11/08/2016 11/01/2016 10/26/2016 10/07/2016 02/19/6766 2/0/9470  Systolic BP 962 836 629 476 546 503 546  Diastolic BP 80 84 83 92 93 80 88  Wt. (Lbs) 219 227.12 227 227 227 227.04 227  BMI 35.35 36.66 36.64 36.64 36.64 36.65 36.64   No flowsheet data found.

## 2016-12-21 NOTE — Progress Notes (Signed)
Sarah Coleman     MRN: [id number]      DOB: [birth year]   HPI Ms. Schewe is here for follow up and re-evaluation of chronic medical conditions, medication management and review of any available recent lab and radiology data.  Preventive health is updated, specifically  Cancer screening and Immunization.   Questions or concerns regarding consultations or procedures which the PT has had in the interim are  addressed. The PT denies any adverse reactions to current medications since the last visit.  There are no new concerns.  There are no specific complaints   ROS Denies recent fever or chills. Denies sinus pressure, nasal congestion, ear pain or sore throat. Denies chest congestion, productive cough or wheezing. Denies chest pains, palpitations and leg swelling Denies abdominal pain, nausea, vomiting,diarrhea or constipation.   Denies dysuria, frequency, hesitancy or incontinence. Denies joint pain, swelling and limitation in mobility. Denies headaches, seizures, numbness, or tingling. Denies depression, anxiety or insomnia. Denies skin break down or rash.   PE  BP 140/80 (BP Location: Left Arm, Patient Position: Sitting, Cuff Size: Normal)   Pulse 96   Temp 98.2 F (36.8 C) (Temporal)   Resp 18   Ht 5\' 6"  (1.676 m)   Wt 219 lb (99.3 kg)   SpO2 97%   BMI 35.35 kg/m   Patient alert and oriented and in no cardiopulmonary distress.  HEENT: No facial asymmetry, EOMI,   oropharynx pink and moist.  Neck supple no JVD, no mass.  Chest: Clear to auscultation bilaterally.  CVS: S1, S2 no murmurs, no S3.Regular rate.  ABD: Soft non tender.   Ext: No edema  MS: Adequate ROM spine, shoulders, hips and knees.  Skin: Intact, no ulcerations or rash noted.  Psych: Good eye contact, normal affect. Memory intact not anxious or depressed appearing.  CNS: CN 2-12 intact, power,  normal throughout.no focal deficits noted.   Assessment & Plan  Prediabetes Patient educated about  the importance of limiting  Carbohydrate intake , the need to commit to daily physical activity for a minimum of 30 minutes , and to commit weight loss. The fact that changes in all these areas will reduce or eliminate all together the development of diabetes is stressed.  Deteriorated  Diabetic Labs Latest Ref Rng & Units 12/15/2016 11/08/2016 10/26/2016 07/27/2016 02/08/2016  HbA1c <5.7 % 6.0(H) - - 5.7(H) 5.9(H)  Chol <200 mg/dL 185 - - 184 192  HDL >50 mg/dL 35(L) - - 37(L) 41(L)  Calc LDL <100 mg/dL 74 - - 78 88  Triglycerides <150 mg/dL 382(H) - - 346(H) 316(H)  Creatinine 0.50 - 0.99 mg/dL 1.03(H) 0.98 1.00 0.99 0.92   BP/Weight 12/20/2016 11/08/2016 11/01/2016 10/26/2016 10/07/2016 04/16/6383 01/19/5992  Systolic BP 570 177 939 030 092 330 076  Diastolic BP 80 84 83 92 93 80 88  Wt. (Lbs) 219 227.12 227 227 227 227.04 227  BMI 35.35 36.66 36.64 36.64 36.64 36.65 36.64   No flowsheet data found.    Morbid obesity Improved Patient re-educated about  the importance of commitment to a  minimum of 150 minutes of exercise per week.  The importance of healthy food choices with portion control discussed. Encouraged to start a food diary, count calories and to consider  joining a support group. Sample diet sheets offered. Goals set by the patient for the next several months.   Weight /BMI 12/20/2016 11/08/2016 11/01/2016  WEIGHT 219 lb 227 lb 1.9 oz 227 lb  HEIGHT 5\' 6"  5'  6" 5\' 6"   BMI 35.35 kg/m2 36.66 kg/m2 36.64 kg/m2      Metabolic syndrome X The increased risk of cardiovascular disease associated with this diagnosis, and the need to consistently work on lifestyle to change this is discussed. Following  a  heart healthy diet ,commitment to 30 minutes of exercise at least 5 days per week, as well as control of blood sugar and cholesterol , and achieving a healthy weight are all the areas to be addressed .   Hyperlipidemia LDL goal <100 Uncontrolled, elevated  triglycerides Hyperlipidemia:Low fat diet discussed and encouraged.   Lipid Panel  Lab Results  Component Value Date   CHOL 185 12/15/2016   HDL 35 (L) 12/15/2016   LDLCALC 74 12/15/2016   LDLDIRECT 69 02/13/2009   TRIG 382 (H) 12/15/2016   CHOLHDL 5.3 (H) 12/15/2016       GERD (gastroesophageal reflux disease) improved  Essential hypertension Sub optimal control, not at goal, no med change DASH diet and commitment to daily physical activity for a minimum of 30 minutes discussed and encouraged, as a part of hypertension management. The importance of attaining a healthy weight is also discussed.  BP/Weight 12/20/2016 11/08/2016 11/01/2016 10/26/2016 10/07/2016 12/15/7941 09/21/6145  Systolic BP 092 957 473 403 709 643 838  Diastolic BP 80 84 83 92 93 80 88  Wt. (Lbs) 219 227.12 227 227 227 227.04 227  BMI 35.35 36.66 36.64 36.64 36.64 36.65 36.64

## 2016-12-21 NOTE — Assessment & Plan Note (Signed)
The increased risk of cardiovascular disease associated with this diagnosis, and the need to consistently work on lifestyle to change this is discussed. Following  a  heart healthy diet ,commitment to 30 minutes of exercise at least 5 days per week, as well as control of blood sugar and cholesterol , and achieving a healthy weight are all the areas to be addressed .  

## 2016-12-21 NOTE — Assessment & Plan Note (Signed)
Uncontrolled, elevated triglycerides Hyperlipidemia:Low fat diet discussed and encouraged.   Lipid Panel  Lab Results  Component Value Date   CHOL 185 12/15/2016   HDL 35 (L) 12/15/2016   LDLCALC 74 12/15/2016   LDLDIRECT 69 02/13/2009   TRIG 382 (H) 12/15/2016   CHOLHDL 5.3 (H) 12/15/2016

## 2016-12-21 NOTE — Assessment & Plan Note (Signed)
Sub optimal control, not at goal, no med change DASH diet and commitment to daily physical activity for a minimum of 30 minutes discussed and encouraged, as a part of hypertension management. The importance of attaining a healthy weight is also discussed.  BP/Weight 12/20/2016 11/08/2016 11/01/2016 10/26/2016 10/07/2016 08/20/1094 0/11/5407  Systolic BP 811 914 782 956 213 086 578  Diastolic BP 80 84 83 92 93 80 88  Wt. (Lbs) 219 227.12 227 227 227 227.04 227  BMI 35.35 36.66 36.64 36.64 36.64 36.65 36.64

## 2016-12-21 NOTE — Assessment & Plan Note (Signed)
Improved Patient re-educated about  the importance of commitment to a  minimum of 150 minutes of exercise per week.  The importance of healthy food choices with portion control discussed. Encouraged to start a food diary, count calories and to consider  joining a support group. Sample diet sheets offered. Goals set by the patient for the next several months.   Weight /BMI 12/20/2016 11/08/2016 11/01/2016  WEIGHT 219 lb 227 lb 1.9 oz 227 lb  HEIGHT 5\' 6"  5\' 6"  5\' 6"   BMI 35.35 kg/m2 36.66 kg/m2 36.64 kg/m2

## 2016-12-21 NOTE — Assessment & Plan Note (Signed)
improved

## 2016-12-28 ENCOUNTER — Other Ambulatory Visit: Payer: Self-pay

## 2016-12-28 MED ORDER — ESTRADIOL 0.1 MG/GM VA CREA: 1.0000 | TOPICAL_CREAM | VAGINAL | 3 refills | Status: DC | PRN

## 2017-01-16 ENCOUNTER — Encounter: Payer: Self-pay | Admitting: Cardiology

## 2017-01-16 ENCOUNTER — Ambulatory Visit (INDEPENDENT_AMBULATORY_CARE_PROVIDER_SITE_OTHER): Payer: PPO | Admitting: Cardiology

## 2017-01-16 VITALS — BP 149/87 | HR 83 | Ht 66.0 in | Wt 220.0 lb

## 2017-01-16 DIAGNOSIS — I10 Essential (primary) hypertension: Secondary | ICD-10-CM

## 2017-01-16 DIAGNOSIS — E782 Mixed hyperlipidemia: Secondary | ICD-10-CM | POA: Diagnosis not present

## 2017-01-16 MED ORDER — CHLORTHALIDONE 25 MG PO TABS: 25.0000 mg | ORAL_TABLET | Freq: Every day | ORAL | 3 refills | Status: DC

## 2017-01-16 MED ORDER — SPIRONOLACTONE 25 MG PO TABS: 12.5000 mg | ORAL_TABLET | Freq: Every day | ORAL | 3 refills | Status: DC

## 2017-01-16 NOTE — Patient Instructions (Signed)
Medication Instructions:   Stop Maxzide.  Begin Chlorthalisone 25mg  daily.  Begin Aldactone 12.5mg  daily.  Continue all other medications.    Labwork:  BMET, Magnesium - orders given today.  Due in 2 weeks.  Office will contact with results via phone or letter.    Testing/Procedures: none  Follow-Up: Your physician wants you to follow up in:  1 year.  You will receive a reminder letter in the mail one-two months in advance.  If you don't receive a letter, please call our office to schedule the follow up appointment   Any Other Special Instructions Will Be Listed Below (If Applicable).  If you need a refill on your cardiac medications before your next appointment, please call your pharmacy.

## 2017-01-16 NOTE — Progress Notes (Signed)
Clinical Summary Sarah Coleman is a 66 y.o.female seen today for follow up of the following medical problems.   1. HTN - compliant with meds - does not check bp regulary at home.   2. Hyperlipidemia - primary issue has been elevated TGs. She has been working on dietary changes though admits to some indiscretions.  - compliant with statin. Has been on fish oil - 12/2016 TC 383 HDL 35 LDL 74  -making dietary chocolates.  Past Medical History:  Diagnosis Date  . Allergic rhinitis   . Allergy   . Anemia   . Arthritis   . GERD (gastroesophageal reflux disease) 2013  . Hyperlipidemia   . Hypertension   . Metabolic syndrome X 6295  . Morbid obesity ([place]) 2000  . Obesity   . Prediabetes 2010     Allergies  Allergen Reactions  . Cinnamon Swelling  . Ibuprofen Swelling  . Penicillins Nausea And Vomiting    Has patient had a PCN reaction causing immediate rash, facial/tongue/throat swelling, SOB or lightheadedness with hypotension:No Has patient had a PCN reaction causing severe rash involving mucus membranes or skin necrosis:No Has patient had a PCN reaction that required hospitalization:No Has patient had a PCN reaction occurring within the last 10 years:No If all of the above answers are "NO", then may proceed with Cephalosporin use.   Marland Kitchen Sweet Potato Swelling     Current Outpatient Prescriptions  Medication Sig Dispense Refill  . acetaminophen (TYLENOL) 325 MG tablet Take 325-650 mg by mouth every 6 (six) hours as needed (for pain/headaches.).    Marland Kitchen azelastine (ASTELIN) 0.1 % nasal spray Place 1 spray into both nostrils 2 (two) times daily. Use in each nostril as directed 30 mL 12  . CALCIUM-MAGNESIUM-ZINC PO Take 1 tablet by mouth 3 (three) times daily.     . carboxymethylcellulose (REFRESH PLUS) 0.5 % SOLN Place 1-2 drops into both eyes 3 (three) times daily as needed (for dry eyes).    . cholecalciferol (VITAMIN D) 1000 units tablet Take 1,000 Units by mouth daily.      . cyclobenzaprine (FLEXERIL) 10 MG tablet TAKE ONE TABLET BY MOUTH AT BEDTIME 30 tablet 3  . EQ ALLERGY RELIEF 10 MG tablet TAKE ONE TABLET BY MOUTH ONCE DAILY (Patient taking differently: TAKE ONE TABLET BY MOUTH ONCE DAILY IN THE MORNING) 90 tablet 1  . estradiol (ESTRACE) 0.1 MG/GM vaginal cream Place 1 Applicatorful vaginally every other day as needed (for vaginal discomfort). 42.5 g 3  . fluticasone (FLONASE) 50 MCG/ACT nasal spray USE TWO SPRAY(S) IN EACH NOSTRIL ONCE DAILY (Patient taking differently: USE TWO SPRAY(S) IN EACH NOSTRIL ONCE DAILY IN THE EVENING) 48 g 1  . Iron-Vitamins (GERITOL PO) Take 1 tablet by mouth daily.    Marland Kitchen lovastatin (MEVACOR) 20 MG tablet TAKE ONE TABLET BY MOUTH AT BEDTIME 90 tablet 1  . meclizine (ANTIVERT) 12.5 MG tablet TAKE ONE TABLET BY MOUTH THREE TIMES DAILY AS NEEDED FOR DIZZINESS (Patient taking differently: TAKE ONE TABLET BY MOUTH THREE TIMES DAILY AS NEEDED FOR DIZZINESS/VERTIGO) 90 tablet 2  . Omega-3 Fatty Acids (OMEGA-3 FISH OIL PO) Take 3 tablets by mouth daily. Omega xl     . pantoprazole (PROTONIX) 20 MG tablet TAKE ONE TABLET BY MOUTH ONCE DAILY 90 tablet 1  . potassium chloride SA (K-DUR,KLOR-CON) 20 MEQ tablet Take 1 tablet (20 mEq total) by mouth 2 (two) times daily. 180 tablet 3  . ranitidine (ZANTAC) 150 MG capsule Take 150 mg  by mouth as needed for heartburn. Reported on 10/07/2015    . triamterene-hydrochlorothiazide (MAXZIDE) 75-50 MG tablet TAKE ONE TABLET BY MOUTH ONCE DAILY 90 tablet 1   No current facility-administered medications for this visit.      Past Surgical History:  Procedure Laterality Date  . COLONOSCOPY N/A 10/07/2016   Procedure: COLONOSCOPY;  Surgeon: Danie Binder, MD;  Location: [facility];  Service: Endoscopy;  Laterality: N/A;  9:30 AM  . COLONOSCOPY WITH PROPOFOL N/A 11/01/2016   Procedure: COLONOSCOPY WITH PROPOFOL;  Surgeon: Danie Binder, MD;  Location: [facility];  Service: Endoscopy;  Laterality:  N/A;  830   . GERD    . PARTIAL HYSTERECTOMY  1987   secondary to ovarian cyst   . POLYPECTOMY  11/01/2016   Procedure: POLYPECTOMY;  Surgeon: Danie Binder, MD;  Location: [facility];  Service: Endoscopy;;  colon  . TUBAL LIGATION  1975     Allergies  Allergen Reactions  . Cinnamon Swelling  . Ibuprofen Swelling  . Penicillins Nausea And Vomiting    Has patient had a PCN reaction causing immediate rash, facial/tongue/throat swelling, SOB or lightheadedness with hypotension:No Has patient had a PCN reaction causing severe rash involving mucus membranes or skin necrosis:No Has patient had a PCN reaction that required hospitalization:No Has patient had a PCN reaction occurring within the last 10 years:No If all of the above answers are "NO", then may proceed with Cephalosporin use.   Marland Kitchen Sweet Potato Swelling      Family History  Problem Relation Age of Onset  . Heart attack Mother   . Heart disease Mother 1       massive heart attack  . Prostate cancer Father   . Cancer Father 30       colon  . Hypertension Sister   . Ovarian cancer Sister   . Cancer Brother 42       oral   . Hypertension Brother   . Heart disease Brother      Social History Sarah Coleman reports that she quit smoking about 42 years ago. She has a 3.00 pack-year smoking history. She has never used smokeless tobacco. Sarah Coleman reports that she does not drink alcohol.   Review of Systems CONSTITUTIONAL: No weight loss, fever, chills, weakness or fatigue.  HEENT: Eyes: No visual loss, blurred vision, double vision or yellow sclerae.No hearing loss, sneezing, congestion, runny nose or sore throat.  SKIN: No rash or itching.  CARDIOVASCULAR: no chest pain, no palpitations.  RESPIRATORY: No shortness of breath, cough or sputum.  GASTROINTESTINAL: No anorexia, nausea, vomiting or diarrhea. No abdominal pain or blood.  GENITOURINARY: No burning on urination, no polyuria NEUROLOGICAL: No headache,  dizziness, syncope, paralysis, ataxia, numbness or tingling in the extremities. No change in bowel or bladder control.  MUSCULOSKELETAL: No muscle, back pain, joint pain or stiffness.  LYMPHATICS: No enlarged nodes. No history of splenectomy.  PSYCHIATRIC: No history of depression or anxiety.  ENDOCRINOLOGIC: No reports of sweating, cold or heat intolerance. No polyuria or polydipsia.  Marland Kitchen   Physical Examination Vitals:   01/16/17 0827  BP: (!) 149/87  Pulse: 83   Vitals:   01/16/17 0827  Weight: 220 lb (99.8 kg)  Height: 5\' 6"  (1.676 m)    Gen: resting comfortably, no acute distress HEENT: no scleral icterus, pupils equal round and reactive, no palptable cervical adenopathy,  CV: RRR, no m/r/g, no jvd Resp: Clear to auscultation bilaterally GI: abdomen is soft,  non-tender, non-distended, normal bowel sounds, no hepatosplenomegaly MSK: extremities are warm, no edema.  Skin: warm, no rash Neuro:  no focal deficits Psych: appropriate affect   Diagnostic Studies 01/2015 echo Study Conclusions  - Left ventricle: The cavity size was normal. Wall thickness was  normal. Systolic function was normal. The estimated ejection  fraction was in the range of 60% to 65%. Wall motion was normal;  there were no regional wall motion abnormalities. Doppler  parameters are consistent with abnormal left ventricular  relaxation (grade 1 diastolic dysfunction). - Mitral valve: There was trivial regurgitation. - Right atrium: Central venous pressure (est): 3 mm Hg. - Atrial septum: A patent foramen ovale cannot be excluded based on  available images. - Tricuspid valve: There was trivial regurgitation. - Pericardium, extracardiac: There was no pericardial effusion.  Impressions:  - Normal LV wall thickness with LVEF 18-40%, grade 1 diastolic  dysfunction. Trivial mitral and tricuspid regurgitation. Cannot  exclude PFO based on available images. Unable to assess  PASP.     Assessment and Plan   1. HTN - elevated in clinic. We will d/c maxide, start chlorthalidone 25mg  and aldactone 12.5mg  daily - check BMET/Mg in 2 weeks. May need to adjust aldactone or her home KCl based on those results, traditionally has had issues with low potassium    2. Hyperlipidemia - we discussed dietary and lifestyle modifications to improve triglycerides -continue statin, fish oil.   EKG in clinic shows SR, no ischemic change  F/u 1 year  [name], M.D.

## 2017-01-17 ENCOUNTER — Encounter: Payer: Self-pay | Admitting: Family Medicine

## 2017-01-17 ENCOUNTER — Ambulatory Visit (INDEPENDENT_AMBULATORY_CARE_PROVIDER_SITE_OTHER): Payer: PPO | Admitting: Family Medicine

## 2017-01-17 VITALS — BP 130/90 | HR 96 | Resp 16 | Ht 66.0 in | Wt 221.0 lb

## 2017-01-17 DIAGNOSIS — M549 Dorsalgia, unspecified: Secondary | ICD-10-CM

## 2017-01-17 DIAGNOSIS — N3090 Cystitis, unspecified without hematuria: Secondary | ICD-10-CM

## 2017-01-17 DIAGNOSIS — E8881 Metabolic syndrome: Secondary | ICD-10-CM

## 2017-01-17 DIAGNOSIS — R102 Pelvic and perineal pain: Secondary | ICD-10-CM

## 2017-01-17 DIAGNOSIS — I10 Essential (primary) hypertension: Secondary | ICD-10-CM | POA: Diagnosis not present

## 2017-01-17 DIAGNOSIS — E785 Hyperlipidemia, unspecified: Secondary | ICD-10-CM

## 2017-01-17 LAB — POCT URINALYSIS DIPSTICK
BILIRUBIN UA: NEGATIVE
Blood, UA: NEGATIVE
GLUCOSE UA: NEGATIVE
KETONES UA: NEGATIVE
LEUKOCYTES UA: NEGATIVE
Nitrite, UA: NEGATIVE
PROTEIN UA: NEGATIVE
SPEC GRAV UA: 1.015 (ref 1.010–1.025)
Urobilinogen, UA: 0.2 E.U./dL
pH, UA: 7 (ref 5.0–8.0)

## 2017-01-17 MED ORDER — FLUCONAZOLE 150 MG PO TABS: ORAL_TABLET | ORAL | 0 refills | Status: DC

## 2017-01-17 MED ORDER — PREDNISONE 5 MG (21) PO TBPK: 5.0000 mg | ORAL_TABLET | ORAL | 0 refills | Status: DC

## 2017-01-17 MED ORDER — METHYLPREDNISOLONE ACETATE 80 MG/ML IJ SUSP
80.0000 mg | Freq: Once | INTRAMUSCULAR | Status: AC
  Administered 2017-01-17: 80 mg via INTRAMUSCULAR

## 2017-01-17 NOTE — Patient Instructions (Addendum)
Physical exam as before , call if you need me sooner  Urine shows no infection  Medication is sent for low back pain radiaiting to stomach, 6 day course of prednisone, and depo medrol administered in offfice  Two  tablets sent for possible yeast infection, do not take lovastatin, cholesterol tablet the days you take the  fluconazole  You are being referred for a pelvic ultrasound  Hope you feel better soon  Thank you  for choosing [facility]. We consider it a privelige to serve you.  Delivering excellent health care in a caring and  compassionate way is our goal.  Partnering with you,  so that together we can achieve this goal is our strategy.

## 2017-01-17 NOTE — Progress Notes (Signed)
CHAIRTY Sarah Coleman     MRN: [id number]      DOB: Aug 10, 1951   HPI Sarah Coleman is here for follow up and re-evaluation of chronic medical conditions, medication management and review of any available recent lab and radiology data.  Preventive health is updated, specifically  Cancer screening and Immunization.   Saw cardiology yesterday, BP elevated and med change made pt has not started as yet, and will fill script today  1 month h/oburning with urine, incomplete emptying  And lower pelvic pain radiaitng to back, wonders if her ovary is the problem'states had one ovary removed  And womb at age 21 had a cyst    ROS Denies recent fever or chills. Denies sinus pressure, nasal congestion, ear pain or sore throat. Denies chest congestion, productive cough or wheezing. Denies chest pains, palpitations and leg swelling Denies abdominal pain, nausea, vomiting,diarrhea or constipation.    Denies headaches, seizures, numbness, or tingling. Denies depression, anxiety or insomnia. Denies skin break down or rash.   PE  BP 120/84   Pulse 96   Resp 16   Ht 5\' 6"  (1.676 m)   Wt 221 lb (100.2 kg)   SpO2 95%   BMI 35.67 kg/m   Patient alert and oriented and in no cardiopulmonary distress.  HEENT: No facial asymmetry, EOMI,   oropharynx pink and moist.  Neck supple no JVD, no mass.  Chest: Clear to auscultation bilaterally.  CVS: S1, S2 no murmurs, no S3.Regular rate.  ABD: Soft very tender LLQ, with mild guarding and rebound, no palpable organomegaly or mass.  Pelvic: Womb absent, no palpable adnexal mass,  However tender in left adnexa, white discharge present, no odor, no ulcers on exam  Ext: No edema  MS: Adequate ROM spine, shoulders, hips and knees.  Skin: Intact, no ulcerations or rash noted.  Psych: Good eye contact, normal affect. Memory intact not anxious or depressed appearing.  CNS: CN 2-12 intact, power,  normal throughout.no focal deficits noted.   Assessment &  Plan  Back pain with radiation Depomedrol 80 mg IM in office followed by prednisone 5 mg dose pack Current flare radiating to LLQ  Pelvic pain in female One month h/o lower pelvic pain, had hysterectomy and one ovary removed at age 71 reportedly, now concerned that the remaining ovary may be a problem, will refer for [place], tender on exam over LLQ  Morbid obesity Deteriorated. Patient re-educated about  the importance of commitment to a  minimum of 150 minutes of exercise per week.  The importance of healthy food choices with portion control discussed. Encouraged to start a food diary, count calories and to consider  joining a support group. Sample diet sheets offered. Goals set by the patient for the next several months.   Weight /BMI 01/17/2017 01/16/2017 12/20/2016  WEIGHT 221 lb 220 lb 219 lb  HEIGHT 5\' 6"  5\' 6"  5\' 6"   BMI 35.67 kg/m2 35.51 kg/m2 35.35 kg/m2      Essential hypertension UNControlled, change in medication recently proposed by cardiology, pt to start in near future DASH diet and commitment to daily physical activity for a minimum of 30 minutes discussed and encouraged, as a part of hypertension management. The importance of attaining a healthy weight is also discussed.  BP/Weight 01/17/2017 01/16/2017 12/20/2016 11/08/2016 11/01/2016 10/26/2016 3/84/5364  Systolic BP 680 321 224 825 003 704 888  Diastolic BP 90 87 80 84 83 92 93  Wt. (Lbs) 221 220 219 227.12 227 227 227  BMI 35.67 35.51 35.35 36.66 36.64 36.64 36.64       Hyperlipidemia LDL goal <100 Hyperlipidemia:Low fat diet discussed and encouraged.   Lipid Panel  Lab Results  Component Value Date   CHOL 185 12/15/2016   HDL 35 (L) 12/15/2016   LDLCALC 74 12/15/2016   LDLDIRECT 69 02/13/2009   TRIG 382 (H) 12/15/2016   CHOLHDL 5.3 (H) 12/15/2016    uncontrolled with high cV risk, needs to change diet Updated lab needed at/ before next visit.    Metabolic syndrome X The increased risk of cardiovascular  disease associated with this diagnosis, and the need to consistently work on lifestyle to change this is discussed. Following  a  heart healthy diet ,commitment to 30 minutes of exercise at least 5 days per week, as well as control of blood sugar and cholesterol , and achieving a healthy weight are all the areas to be addressed .   Cystitis Urinalysis is normal and pt is reassured no infection

## 2017-01-17 NOTE — Assessment & Plan Note (Addendum)
Depomedrol 80 mg IM in office followed by prednisone 5 mg dose pack Current flare radiating to LLQ

## 2017-01-20 DIAGNOSIS — R102 Pelvic and perineal pain: Secondary | ICD-10-CM | POA: Insufficient documentation

## 2017-01-20 NOTE — Assessment & Plan Note (Signed)
Hyperlipidemia:Low fat diet discussed and encouraged.   Lipid Panel  Lab Results  Component Value Date   CHOL 185 12/15/2016   HDL 35 (L) 12/15/2016   LDLCALC 74 12/15/2016   LDLDIRECT 69 02/13/2009   TRIG 382 (H) 12/15/2016   CHOLHDL 5.3 (H) 12/15/2016    uncontrolled with high cV risk, needs to change diet Updated lab needed at/ before next visit.

## 2017-01-20 NOTE — Assessment & Plan Note (Signed)
The increased risk of cardiovascular disease associated with this diagnosis, and the need to consistently work on lifestyle to change this is discussed. Following  a  heart healthy diet ,commitment to 30 minutes of exercise at least 5 days per week, as well as control of blood sugar and cholesterol , and achieving a healthy weight are all the areas to be addressed .  

## 2017-01-20 NOTE — Assessment & Plan Note (Signed)
Urinalysis is normal and pt is reassured no infection

## 2017-01-20 NOTE — Assessment & Plan Note (Signed)
Deteriorated. Patient re-educated about  the importance of commitment to a  minimum of 150 minutes of exercise per week.  The importance of healthy food choices with portion control discussed. Encouraged to start a food diary, count calories and to consider  joining a support group. Sample diet sheets offered. Goals set by the patient for the next several months.   Weight /BMI 01/17/2017 01/16/2017 12/20/2016  WEIGHT 221 lb 220 lb 219 lb  HEIGHT 5\' 6"  5\' 6"  5\' 6"   BMI 35.67 kg/m2 35.51 kg/m2 35.35 kg/m2

## 2017-01-20 NOTE — Assessment & Plan Note (Signed)
UNControlled, change in medication recently proposed by cardiology, pt to start in near future DASH diet and commitment to daily physical activity for a minimum of 30 minutes discussed and encouraged, as a part of hypertension management. The importance of attaining a healthy weight is also discussed.  BP/Weight 01/17/2017 01/16/2017 12/20/2016 11/08/2016 11/01/2016 10/26/2016 9/89/2119  Systolic BP 417 408 144 818 563 149 702  Diastolic BP 90 87 80 84 83 92 93  Wt. (Lbs) 221 220 219 227.12 227 227 227  BMI 35.67 35.51 35.35 36.66 36.64 36.64 36.64

## 2017-01-20 NOTE — Assessment & Plan Note (Signed)
One month h/o lower pelvic pain, had hysterectomy and one ovary removed at age 66 reportedly, now concerned that the remaining ovary may be a problem, will refer for [place], tender on exam over LLQ

## 2017-01-25 ENCOUNTER — Telehealth: Payer: Self-pay | Admitting: *Deleted

## 2017-01-25 ENCOUNTER — Other Ambulatory Visit: Payer: Self-pay | Admitting: Family Medicine

## 2017-01-25 DIAGNOSIS — M5442 Lumbago with sciatica, left side: Secondary | ICD-10-CM

## 2017-01-25 NOTE — Telephone Encounter (Signed)
Patient called stating she is having pain in her lower back and is scheduled for a CT tomorrow, patient is requesting something for pain. Please advise [phone] or [phone]

## 2017-01-25 NOTE — Telephone Encounter (Signed)
Spoke with pt c/o low back pain limiting hr ability to walk for over 1 month, prednisone afforded some relief, radiates to LLQ I have ordered x ray of low back also, and I recommended tylenol 325 mg two tabs twice daily and flexeril at bedtime, she is fine with that

## 2017-01-26 ENCOUNTER — Ambulatory Visit (HOSPITAL_COMMUNITY)
Admission: RE | Admit: 2017-01-26 | Discharge: 2017-01-26 | Disposition: A | Discharge status: Home / Self Care | Payer: PPO | Source: Ambulatory Visit | Attending: Family Medicine | Admitting: Family Medicine

## 2017-01-26 DIAGNOSIS — R1032 Left lower quadrant pain: Secondary | ICD-10-CM | POA: Diagnosis not present

## 2017-01-26 DIAGNOSIS — M5137 Other intervertebral disc degeneration, lumbosacral region: Secondary | ICD-10-CM | POA: Insufficient documentation

## 2017-01-26 DIAGNOSIS — M5442 Lumbago with sciatica, left side: Secondary | ICD-10-CM | POA: Diagnosis not present

## 2017-01-26 DIAGNOSIS — M545 Low back pain: Secondary | ICD-10-CM | POA: Diagnosis not present

## 2017-01-26 DIAGNOSIS — R102 Pelvic and perineal pain: Secondary | ICD-10-CM

## 2017-01-30 ENCOUNTER — Telehealth: Payer: Self-pay

## 2017-01-30 ENCOUNTER — Other Ambulatory Visit: Payer: Self-pay | Admitting: Cardiology

## 2017-01-30 DIAGNOSIS — I10 Essential (primary) hypertension: Secondary | ICD-10-CM | POA: Diagnosis not present

## 2017-01-30 DIAGNOSIS — Z79899 Other long term (current) drug therapy: Secondary | ICD-10-CM

## 2017-01-30 LAB — BASIC METABOLIC PANEL
BUN: 11 mg/dL (ref 7–25)
CO2: 27 mmol/L (ref 20–31)
Calcium: 9.5 mg/dL (ref 8.6–10.4)
Chloride: 99 mmol/L (ref 98–110)
Creat: 1 mg/dL — ABNORMAL HIGH (ref 0.50–0.99)
Glucose, Bld: 106 mg/dL — ABNORMAL HIGH (ref 65–99)
POTASSIUM: 3.3 mmol/L — AB (ref 3.5–5.3)
Sodium: 137 mmol/L (ref 135–146)

## 2017-01-30 LAB — MAGNESIUM: MAGNESIUM: 2 mg/dL (ref 1.5–2.5)

## 2017-01-30 NOTE — Telephone Encounter (Signed)
-----   Message from [name], MD sent at 01/30/2017  3:29 PM EDT ----- Potassium is mildly decreased, clarify how she has been taking her potassium.   J branch MD

## 2017-01-30 NOTE — Telephone Encounter (Signed)
Spoke with pt. She states that she is taking her potassim as prescribed.

## 2017-01-30 NOTE — Telephone Encounter (Signed)
Have her increase her potassium to 40 in am and 20 in pm, repeat BMET and Mg in 2 weeks  Zandra Abts MD

## 2017-01-31 MED ORDER — POTASSIUM CHLORIDE CRYS ER 20 MEQ PO TBCR: EXTENDED_RELEASE_TABLET | ORAL | 3 refills | Status: DC

## 2017-01-31 NOTE — Addendum Note (Signed)
Addended by: Debbora Lacrosse R on: 01/31/2017 11:56 AM   Modules accepted: Orders

## 2017-01-31 NOTE — Telephone Encounter (Signed)
Called pt. Left message. Will mail lab slip.

## 2017-02-08 ENCOUNTER — Encounter (HOSPITAL_COMMUNITY): Payer: Self-pay | Admitting: Emergency Medicine

## 2017-02-08 ENCOUNTER — Emergency Department (HOSPITAL_COMMUNITY): Payer: PPO

## 2017-02-08 ENCOUNTER — Observation Stay (HOSPITAL_COMMUNITY)
Admission: EM | Admit: 2017-02-08 | Discharge: 2017-02-09 | Disposition: A | Discharge status: Home / Self Care | Payer: PPO | Attending: Internal Medicine | Admitting: Internal Medicine

## 2017-02-08 DIAGNOSIS — Z87891 Personal history of nicotine dependence: Secondary | ICD-10-CM | POA: Diagnosis not present

## 2017-02-08 DIAGNOSIS — Z79899 Other long term (current) drug therapy: Secondary | ICD-10-CM | POA: Insufficient documentation

## 2017-02-08 DIAGNOSIS — R079 Chest pain, unspecified: Secondary | ICD-10-CM | POA: Diagnosis not present

## 2017-02-08 DIAGNOSIS — K219 Gastro-esophageal reflux disease without esophagitis: Secondary | ICD-10-CM | POA: Diagnosis not present

## 2017-02-08 DIAGNOSIS — I10 Essential (primary) hypertension: Secondary | ICD-10-CM | POA: Diagnosis not present

## 2017-02-08 DIAGNOSIS — E785 Hyperlipidemia, unspecified: Secondary | ICD-10-CM | POA: Diagnosis not present

## 2017-02-08 DIAGNOSIS — R0789 Other chest pain: Secondary | ICD-10-CM | POA: Diagnosis not present

## 2017-02-08 DIAGNOSIS — R072 Precordial pain: Secondary | ICD-10-CM | POA: Diagnosis not present

## 2017-02-08 DIAGNOSIS — R7303 Prediabetes: Secondary | ICD-10-CM

## 2017-02-08 DIAGNOSIS — R0602 Shortness of breath: Secondary | ICD-10-CM | POA: Diagnosis not present

## 2017-02-08 LAB — BASIC METABOLIC PANEL
Anion gap: 7 (ref 5–15)
BUN: 11 mg/dL (ref 6–20)
CALCIUM: 9.2 mg/dL (ref 8.9–10.3)
CO2: 28 mmol/L (ref 22–32)
Chloride: 99 mmol/L — ABNORMAL LOW (ref 101–111)
Creatinine, Ser: 0.91 mg/dL (ref 0.44–1.00)
GFR calc Af Amer: >60 mL/min (ref >60)
GLUCOSE: 111 mg/dL — AB (ref 65–99)
Potassium: 3.1 mmol/L — ABNORMAL LOW (ref 3.5–5.1)
Sodium: 134 mmol/L — ABNORMAL LOW (ref 135–145)

## 2017-02-08 LAB — CBC WITH DIFFERENTIAL/PLATELET
BASOS ABS: 0 10*3/uL (ref 0.0–0.1)
BASOS PCT: 0 %
EOS PCT: 1 %
Eosinophils Absolute: 0.1 10*3/uL (ref 0.0–0.7)
HEMATOCRIT: 34.7 % — AB (ref 36.0–46.0)
Hemoglobin: 11.9 g/dL — ABNORMAL LOW (ref 12.0–15.0)
LYMPHS PCT: 49 %
Lymphs Abs: 3 10*3/uL (ref 0.7–4.0)
MCH: 30.8 pg (ref 26.0–34.0)
MCHC: 34.3 g/dL (ref 30.0–36.0)
MCV: 89.9 fL (ref 78.0–100.0)
MONO ABS: 0.3 10*3/uL (ref 0.1–1.0)
MONOS PCT: 4 %
Neutro Abs: 2.8 10*3/uL (ref 1.7–7.7)
Neutrophils Relative %: 46 %
PLATELETS: 258 10*3/uL (ref 150–400)
RBC: 3.86 MIL/uL — ABNORMAL LOW (ref 3.87–5.11)
RDW: 14 % (ref 11.5–15.5)
WBC: 6.1 10*3/uL (ref 4.0–10.5)

## 2017-02-08 LAB — TROPONIN I
Troponin I: <0.03 ng/mL (ref <0.03)
Troponin I: <0.03 ng/mL (ref <0.03)
Troponin I: <0.03 ng/mL (ref <0.03)

## 2017-02-08 MED ORDER — NITROGLYCERIN 0.4 MG SL SUBL: 0.4000 mg | SUBLINGUAL_TABLET | SUBLINGUAL | Status: DC | PRN

## 2017-02-08 MED ORDER — ACETAMINOPHEN 325 MG PO TABS: 650.0000 mg | ORAL_TABLET | ORAL | Status: DC | PRN

## 2017-02-08 MED ORDER — FLUCONAZOLE 100 MG PO TABS
150.0000 mg | ORAL_TABLET | Freq: Once | ORAL | Status: AC
  Administered 2017-02-08: 150 mg via ORAL
  Filled 2017-02-08: qty 2

## 2017-02-08 MED ORDER — GI COCKTAIL ~~LOC~~
30.0000 mL | Freq: Once | ORAL | Status: AC
  Administered 2017-02-08: 30 mL via ORAL
  Filled 2017-02-08: qty 30

## 2017-02-08 MED ORDER — GI COCKTAIL ~~LOC~~: 30.0000 mL | Freq: Four times a day (QID) | ORAL | Status: DC | PRN

## 2017-02-08 MED ORDER — MECLIZINE HCL 12.5 MG PO TABS: 12.5000 mg | ORAL_TABLET | Freq: Three times a day (TID) | ORAL | Status: DC | PRN

## 2017-02-08 MED ORDER — AZELASTINE HCL 0.1 % NA SOLN
1.0000 | Freq: Two times a day (BID) | NASAL | Status: DC
  Filled 2017-02-08: qty 30

## 2017-02-08 MED ORDER — ENOXAPARIN SODIUM 100 MG/ML ~~LOC~~ SOLN
100.0000 mg | Freq: Two times a day (BID) | SUBCUTANEOUS | Status: DC
  Administered 2017-02-08 – 2017-02-09 (×2): 100 mg via SUBCUTANEOUS
  Filled 2017-02-08 (×2): qty 1

## 2017-02-08 MED ORDER — METOPROLOL TARTRATE 25 MG PO TABS
12.5000 mg | ORAL_TABLET | Freq: Two times a day (BID) | ORAL | Status: DC
  Administered 2017-02-08 – 2017-02-09 (×2): 12.5 mg via ORAL
  Filled 2017-02-08 (×2): qty 1

## 2017-02-08 MED ORDER — ASPIRIN EC 325 MG PO TBEC
325.0000 mg | DELAYED_RELEASE_TABLET | Freq: Every day | ORAL | Status: DC
  Administered 2017-02-08 – 2017-02-09 (×2): 325 mg via ORAL
  Filled 2017-02-08 (×2): qty 1

## 2017-02-08 MED ORDER — MORPHINE SULFATE (PF) 2 MG/ML IV SOLN: 2.0000 mg | INTRAVENOUS | Status: DC | PRN

## 2017-02-08 MED ORDER — PANTOPRAZOLE SODIUM 40 MG PO TBEC
40.0000 mg | DELAYED_RELEASE_TABLET | Freq: Every day | ORAL | Status: DC
  Administered 2017-02-08 – 2017-02-09 (×2): 40 mg via ORAL
  Filled 2017-02-08 (×2): qty 1

## 2017-02-08 MED ORDER — POLYVINYL ALCOHOL 1.4 % OP SOLN: 1.0000 [drp] | Freq: Three times a day (TID) | OPHTHALMIC | Status: DC | PRN

## 2017-02-08 MED ORDER — FAMOTIDINE 20 MG PO TABS
20.0000 mg | ORAL_TABLET | Freq: Two times a day (BID) | ORAL | Status: DC
  Administered 2017-02-08 – 2017-02-09 (×2): 20 mg via ORAL
  Filled 2017-02-08 (×2): qty 1

## 2017-02-08 MED ORDER — FLUTICASONE PROPIONATE 50 MCG/ACT NA SUSP
2.0000 | Freq: Every day | NASAL | Status: DC
  Filled 2017-02-08: qty 16

## 2017-02-08 MED ORDER — POTASSIUM CHLORIDE CRYS ER 20 MEQ PO TBCR
20.0000 meq | EXTENDED_RELEASE_TABLET | Freq: Two times a day (BID) | ORAL | Status: DC
  Filled 2017-02-08 (×2): qty 1

## 2017-02-08 MED ORDER — CYCLOBENZAPRINE HCL 10 MG PO TABS
10.0000 mg | ORAL_TABLET | Freq: Every day | ORAL | Status: DC
  Administered 2017-02-08: 10 mg via ORAL
  Filled 2017-02-08: qty 1

## 2017-02-08 MED ORDER — ATORVASTATIN CALCIUM 40 MG PO TABS
40.0000 mg | ORAL_TABLET | Freq: Every day | ORAL | Status: DC
  Administered 2017-02-08: 40 mg via ORAL
  Filled 2017-02-08: qty 1

## 2017-02-08 MED ORDER — POTASSIUM CHLORIDE CRYS ER 20 MEQ PO TBCR
40.0000 meq | EXTENDED_RELEASE_TABLET | Freq: Once | ORAL | Status: AC
  Administered 2017-02-08: 40 meq via ORAL
  Filled 2017-02-08: qty 2

## 2017-02-08 MED ORDER — ONDANSETRON HCL 4 MG/2ML IJ SOLN: 4.0000 mg | Freq: Four times a day (QID) | INTRAMUSCULAR | Status: DC | PRN

## 2017-02-08 NOTE — ED Provider Notes (Signed)
[facility] DEPT Provider Note   CSN: [id number] Arrival date & time: 02/08/17  1048     History   Chief Complaint Chief Complaint  Patient presents with  . Chest Pain    HPI Sarah Coleman is a 66 y.o. female.  HPI  Pt was seen at 1125. Per EMS and pt report, c/o gradual onset and persistence of constant mid-sternal chest "pain" and SOB that began this morning approximately 0930/1000. Pt states she had eaten some cucumbers and then was sitting in a chair watching TV when her symptoms began. Pt states she "felt flushed" up into her neck and bilat arms. EMS gave pt ASA 325mg  and SL ntg x2 with complete relief. Pt states she "feels better" on arrival to the ED. Denies palpitations, no cough, no abd pain, no N/V/D.   Past Medical History:  Diagnosis Date  . Allergic rhinitis   . Allergy   . Anemia   . Arthritis   . GERD (gastroesophageal reflux disease) 2013  . Hyperlipidemia   . Hypertension   . Metabolic syndrome X 3300  . Morbid obesity ([place]) 2000  . Obesity   . Prediabetes 2010    Patient Active Problem List   Diagnosis Date Noted  . Pelvic pain in female 01/20/2017  . Back pain with radiation 01/17/2017  . Polyp of colon   . Family hx of colon cancer   . Cystitis 11/12/2015  . Nonspecific abnormal electrocardiogram (ECG) (EKG) 12/22/2014  . Metabolic syndrome X 76/22/6333  . Prediabetes 04/05/2013  . GERD (gastroesophageal reflux disease) 11/01/2012  . Hyperlipidemia LDL goal <100 06/09/2008  . Episodic recurrent vertigo 12/07/2007  . Morbid obesity ([place]) 12/06/2007  . Essential hypertension 12/06/2007  . Seasonal allergies 12/06/2007    Past Surgical History:  Procedure Laterality Date  . COLONOSCOPY N/A 10/07/2016   Procedure: COLONOSCOPY;  Surgeon: Danie Binder, MD;  Location: [facility];  Service: Endoscopy;  Laterality: N/A;  9:30 AM  . COLONOSCOPY WITH PROPOFOL N/A 11/01/2016   Procedure: COLONOSCOPY WITH PROPOFOL;  Surgeon: Danie Binder,  MD;  Location: [facility];  Service: Endoscopy;  Laterality: N/A;  830   . GERD    . PARTIAL HYSTERECTOMY  1987   secondary to ovarian cyst   . POLYPECTOMY  11/01/2016   Procedure: POLYPECTOMY;  Surgeon: Danie Binder, MD;  Location: [facility];  Service: Endoscopy;;  colon  . TUBAL LIGATION  1975    OB History    Gravida Para Term Preterm AB Living   6 5 5   1      SAB TAB Ectopic Multiple Live Births   1               Home Medications    Prior to Admission medications   Medication Sig Start Date End Date Taking? Authorizing Provider  acetaminophen (TYLENOL) 325 MG tablet Take 325-650 mg by mouth every 6 (six) hours as needed (for pain/headaches.).    [provider]  azelastine (ASTELIN) 0.1 % nasal spray Place 1 spray into both nostrils 2 (two) times daily. Use in each nostril as directed 02/09/16   Fayrene Helper, MD  CALCIUM-MAGNESIUM-ZINC PO Take 1 tablet by mouth 3 (three) times daily.     [provider]  carboxymethylcellulose (REFRESH PLUS) 0.5 % SOLN Place 1-2 drops into both eyes 3 (three) times daily as needed (for dry eyes).    [provider]  chlorthalidone (HYGROTON) 25 MG tablet Take 1 tablet (  25 mg total) by mouth daily. 01/16/17 04/16/17  [name], MD  cholecalciferol (VITAMIN D) 1000 units tablet Take 1,000 Units by mouth daily.    [provider]  cyclobenzaprine (FLEXERIL) 10 MG tablet TAKE ONE TABLET BY MOUTH AT BEDTIME 12/12/16   Fayrene Helper, MD  EQ ALLERGY RELIEF 10 MG tablet TAKE ONE TABLET BY MOUTH ONCE DAILY Patient taking differently: TAKE ONE TABLET BY MOUTH ONCE DAILY IN THE MORNING 10/24/16   Fayrene Helper, MD  estradiol (ESTRACE) 0.1 MG/GM vaginal cream Place 1 Applicatorful vaginally every other day as needed (for vaginal discomfort). 12/28/16   Fayrene Helper, MD  fluconazole (DIFLUCAN) 150 MG tablet One tablet once daily for 2 days , take one tofay and one tablet following  prednisone dose pack 01/17/17   Fayrene Helper, MD  fluticasone (FLONASE) 50 MCG/ACT nasal spray USE TWO SPRAY(S) IN EACH NOSTRIL ONCE DAILY Patient taking differently: USE TWO SPRAY(S) IN EACH NOSTRIL ONCE DAILY IN THE EVENING 08/25/16   Fayrene Helper, MD  Iron-Vitamins (GERITOL PO) Take 1 tablet by mouth daily.    [provider]  lovastatin (MEVACOR) 20 MG tablet TAKE ONE TABLET BY MOUTH AT BEDTIME 11/28/16   Fayrene Helper, MD  meclizine (ANTIVERT) 12.5 MG tablet TAKE ONE TABLET BY MOUTH THREE TIMES DAILY AS NEEDED FOR DIZZINESS Patient taking differently: TAKE ONE TABLET BY MOUTH THREE TIMES DAILY AS NEEDED FOR DIZZINESS/VERTIGO 06/24/16   Fayrene Helper, MD  Omega-3 Fatty Acids (OMEGA-3 FISH OIL PO) Take 3 tablets by mouth daily. Omega xl     [provider]  pantoprazole (PROTONIX) 20 MG tablet TAKE ONE TABLET BY MOUTH ONCE DAILY 11/28/16   Fayrene Helper, MD  potassium chloride SA (K-DUR,KLOR-CON) 20 MEQ tablet Take 40 meq in the morning and 20 meq in the evening. 01/31/17   [name], MD  predniSONE (STERAPRED UNI-PAK 21 TAB) 5 MG (21) TBPK tablet Take 1 tablet (5 mg total) by mouth as directed. Use as directed 01/17/17   Fayrene Helper, MD  ranitidine (ZANTAC) 150 MG capsule Take 150 mg by mouth as needed for heartburn. Reported on 10/07/2015    [provider]  spironolactone (ALDACTONE) 25 MG tablet Take 0.5 tablets (12.5 mg total) by mouth daily. 01/16/17   [name], MD  triamterene-hydrochlorothiazide (MAXZIDE) 75-50 MG tablet Take 1 tablet by mouth daily. 11/07/16   [provider]    Family History Family History  Problem Relation Age of Onset  . Heart attack Mother   . Heart disease Mother 87       massive heart attack  . Prostate cancer Father   . Cancer Father 66       colon  . Hypertension Sister   . Ovarian cancer Sister   . Cancer Brother 75       oral   . Hypertension Brother   . Heart  disease Brother     Social History Social History  Substance Use Topics  . Smoking status: Former Smoker    Packs/day: 1.00    Years: 3.00    Quit date: 08/15/1974  . Smokeless tobacco: Never Used  . Alcohol use No     Allergies   Cinnamon; Ibuprofen; Penicillins; and Sweet potato   Review of Systems Review of Systems ROS: Statement: All systems negative except as marked or noted in the HPI; Constitutional: Negative for fever and chills. ; ; Eyes: Negative for eye pain,  redness and discharge. ; ; ENMT: Negative for ear pain, hoarseness, nasal congestion, sinus pressure and sore throat. ; ; Cardiovascular: +CP, SOB. Negative for palpitations, diaphoresis, and peripheral edema. ; ; Respiratory: Negative for cough, wheezing and stridor. ; ; Gastrointestinal: Negative for nausea, vomiting, diarrhea, abdominal pain, blood in stool, hematemesis, jaundice and rectal bleeding. . ; ; Genitourinary: Negative for dysuria, flank pain and hematuria. ; ; Musculoskeletal: Negative for back pain and neck pain. Negative for swelling and trauma.; ; Skin: Negative for pruritus, rash, abrasions, blisters, bruising and skin lesion.; ; Neuro: Negative for headache, lightheadedness and neck stiffness. Negative for weakness, altered level of consciousness, altered mental status, extremity weakness, paresthesias, involuntary movement, seizure and syncope.       Physical Exam Updated Vital Signs BP 112/78   Pulse 78   Temp 98.4 F (36.9 C) (Oral)   Resp 16   Ht 5\' 6"  (1.676 m)   Wt 99.8 kg (220 lb)   SpO2 99%   BMI 35.51 kg/m   Physical Exam 1130: Physical examination:  Nursing notes reviewed; Vital signs and O2 SAT reviewed;  Constitutional: Well developed, Well nourished, Well hydrated, In no acute distress; Head:  Normocephalic, atraumatic; Eyes: EOMI, PERRL, No scleral icterus; ENMT: Mouth and pharynx normal, Mucous membranes moist; Neck: Supple, Full range of motion, No lymphadenopathy;  Cardiovascular: Regular rate and rhythm, No gallop; Respiratory: Breath sounds clear & equal bilaterally, No wheezes.  Speaking full sentences with ease, Normal respiratory effort/excursion; Chest: Nontender, Movement normal; Abdomen: Soft, Nontender, Nondistended, Normal bowel sounds; Genitourinary: No CVA tenderness; Extremities: Pulses normal, No tenderness, No edema, No calf edema or asymmetry.; Neuro: AA&Ox3, Major CN grossly intact.  Speech clear. No gross focal motor or sensory deficits in extremities.; Skin: Color normal, Warm, Dry.   ED Treatments / Results  Labs (all labs ordered are listed, but only abnormal results are displayed)   EKG  EKG Interpretation  Date/Time:  Wednesday February 08 2017 11:00:56 EDT Ventricular Rate:  77 PR Interval:    QRS Duration: 92 QT Interval:  435 QTC Calculation: 493 R Axis:   -31 Text Interpretation:  Sinus rhythm Left axis deviation Borderline prolonged QT interval No old tracing to compare Confirmed by [facility]  MD, Nunzio Cory [phone]) on 02/08/2017 11:37:32 AM       Radiology   Procedures Procedures (including critical care time)  Medications Ordered in ED Medications  potassium chloride SA (K-DUR,KLOR-CON) CR tablet 40 mEq (not administered)  gi cocktail (Maalox,Lidocaine,Donnatal) (30 mLs Oral Given 02/08/17 1238)     Initial Impression / Assessment and Plan / ED Course  I have reviewed the triage vital signs and the nursing notes.  Pertinent labs & imaging results that were available during my care of the patient were reviewed by me and considered in my medical decision making (see chart for details).  MDM Reviewed: previous chart, nursing note and vitals Reviewed previous: labs Interpretation: labs, ECG and x-ray   Results for orders placed or performed during the hospital encounter of 00/93/81  Basic metabolic panel  Result Value Ref Range   Sodium 134 (L) 135 - 145 mmol/L   Potassium 3.1 (L) 3.5 - 5.1 mmol/L   Chloride  99 (L) 101 - 111 mmol/L   CO2 28 22 - 32 mmol/L   Glucose, Bld 111 (H) 65 - 99 mg/dL   BUN 11 6 - 20 mg/dL   Creatinine, Ser 0.91 0.44 - 1.00 mg/dL   Calcium 9.2 8.9 - 10.3 mg/dL  GFR calc non Af Amer >60 >60 mL/min   GFR calc Af Amer >60 >60 mL/min   Anion gap 7 5 - 15  CBC with Differential  Result Value Ref Range   WBC 6.1 4.0 - 10.5 K/uL   RBC 3.86 (L) 3.87 - 5.11 MIL/uL   Hemoglobin 11.9 (L) 12.0 - 15.0 g/dL   HCT 34.7 (L) 36.0 - 46.0 %   MCV 89.9 78.0 - 100.0 fL   MCH 30.8 26.0 - 34.0 pg   MCHC 34.3 30.0 - 36.0 g/dL   RDW 14.0 11.5 - 15.5 %   Platelets 258 150 - 400 K/uL   Neutrophils Relative % 46 %   Neutro Abs 2.8 1.7 - 7.7 K/uL   Lymphocytes Relative 49 %   Lymphs Abs 3.0 0.7 - 4.0 K/uL   Monocytes Relative 4 %   Monocytes Absolute 0.3 0.1 - 1.0 K/uL   Eosinophils Relative 1 %   Eosinophils Absolute 0.1 0.0 - 0.7 K/uL   Basophils Relative 0 %   Basophils Absolute 0.0 0.0 - 0.1 K/uL  Troponin I  Result Value Ref Range   Troponin I <0.03 <0.03 ng/mL   Dg Chest 2 View Result Date: 02/08/2017 CLINICAL DATA:  Chest pain, shortness of Breath EXAM: CHEST  2 VIEW COMPARISON:  None. FINDINGS: Heart and mediastinal contours are within normal limits. No focal opacities or effusions. No acute bony abnormality. IMPRESSION: No active cardiopulmonary disease. Electronically Signed   By: Rolm Baptise M.D.   On: 02/08/2017 11:23    1305:  Pt denies any further CP. Does c/o "my reflux" while in the ED; GI cocktail given with improvement.  T/C to [facility] Dr. Sheran Fava, case discussed, including:  HPI, pertinent PM/SHx, VS/PE, dx testing, ED course and treatment:  Agreeable to admit.   Final Clinical Impressions(s) / ED Diagnoses   Final diagnoses:  None    New Prescriptions New Prescriptions   No medications on file     Francine Graven, DO 02/11/17 [id number]

## 2017-02-08 NOTE — ED Notes (Addendum)
Started with chest pain and SOB this am @1000 .  Given NTG sl, Zofran, and (4) 81 mg ASA via EMS.  Pain eased with first dose of NTG.  Pain was across front chest and back.  Currently not having any pain.

## 2017-02-08 NOTE — Progress Notes (Signed)
ANTICOAGULATION CONSULT NOTE - Initial Consult  Pharmacy Consult for lovenox Indication: chest pain/ACS  Allergies  Allergen Reactions  . Cinnamon Swelling  . Ibuprofen Swelling  . Penicillins Nausea And Vomiting    Has patient had a PCN reaction causing immediate rash, facial/tongue/throat swelling, SOB or lightheadedness with hypotension:No Has patient had a PCN reaction causing severe rash involving mucus membranes or skin necrosis:No Has patient had a PCN reaction that required hospitalization:No Has patient had a PCN reaction occurring within the last 10 years:No If all of the above answers are "NO", then may proceed with Cephalosporin use.   Marland Kitchen Sweet Potato Swelling    Patient Measurements: Height: 5\' 6"  (167.6 cm) Weight: 221 lb 1.9 oz (100.3 kg) IBW/kg (Calculated) : 59.3  Vital Signs: Temp: 98.4 F (36.9 C) (06/27 1605) Temp Source: Oral (06/27 1605) BP: 125/70 (06/27 1605) Pulse Rate: 73 (06/27 1605)  Labs:  Recent Labs  02/08/17 1120  HGB 11.9*  HCT 34.7*  PLT 258  CREATININE 0.91  TROPONINI <0.03    Estimated Creatinine Clearance: 71.7 mL/min (by C-G formula based on SCr of 0.91 mg/dL).   Medical History: Past Medical History:  Diagnosis Date  . Allergic rhinitis   . Allergy   . Anemia   . Arthritis   . GERD (gastroesophageal reflux disease) 2013  . Hyperlipidemia   . Hypertension   . Metabolic syndrome X 0254  . Morbid obesity ([place]) 2000  . Obesity   . Prediabetes 2010    Medications:  Prescriptions Prior to Admission  Medication Sig Dispense Refill Last Dose  . acetaminophen (TYLENOL) 325 MG tablet Take 325-650 mg by mouth every 6 (six) hours as needed (for pain/headaches.).   Past Week at Unknown time  . azelastine (ASTELIN) 0.1 % nasal spray Place 1 spray into both nostrils 2 (two) times daily. Use in each nostril as directed 30 mL 12 02/07/2017 at Unknown time  . CALCIUM-MAGNESIUM-ZINC PO Take 1 tablet by mouth 3 (three) times daily.     Past Week at Unknown time  . carboxymethylcellulose (REFRESH PLUS) 0.5 % SOLN Place 1-2 drops into both eyes 3 (three) times daily as needed (for dry eyes).   Past Month at Unknown time  . chlorthalidone (HYGROTON) 25 MG tablet Take 1 tablet (25 mg total) by mouth daily. 90 tablet 3 02/07/2017 at Unknown time  . cholecalciferol (VITAMIN D) 1000 units tablet Take 1,000 Units by mouth daily.   Past Week at Unknown time  . cyclobenzaprine (FLEXERIL) 10 MG tablet TAKE ONE TABLET BY MOUTH AT BEDTIME 30 tablet 3 Past Week at Unknown time  . EQ ALLERGY RELIEF 10 MG tablet TAKE ONE TABLET BY MOUTH ONCE DAILY (Patient taking differently: TAKE ONE TABLET BY MOUTH ONCE DAILY IN THE MORNING) 90 tablet 1 02/07/2017 at Unknown time  . estradiol (ESTRACE) 0.1 MG/GM vaginal cream Place 1 Applicatorful vaginally every other day as needed (for vaginal discomfort). 42.5 g 3 Past Week at Unknown time  . fluticasone (FLONASE) 50 MCG/ACT nasal spray USE TWO SPRAY(S) IN EACH NOSTRIL ONCE DAILY (Patient taking differently: USE TWO SPRAY(S) IN EACH NOSTRIL ONCE DAILY IN THE EVENING) 48 g 1 02/07/2017 at Unknown time  . Iron-Vitamins (GERITOL PO) Take 1 tablet by mouth daily.   Past Week at Unknown time  . lovastatin (MEVACOR) 20 MG tablet TAKE ONE TABLET BY MOUTH AT BEDTIME 90 tablet 1 02/07/2017 at Unknown time  . meclizine (ANTIVERT) 12.5 MG tablet TAKE ONE TABLET BY MOUTH THREE TIMES  DAILY AS NEEDED FOR DIZZINESS (Patient taking differently: TAKE ONE TABLET BY MOUTH THREE TIMES DAILY AS NEEDED FOR DIZZINESS/VERTIGO) 90 tablet 2 unknown  . Omega-3 Fatty Acids (OMEGA-3 FISH OIL PO) Take 3 tablets by mouth daily. Omega xl    Past Week at Unknown time  . pantoprazole (PROTONIX) 20 MG tablet TAKE ONE TABLET BY MOUTH ONCE DAILY 90 tablet 1 02/07/2017 at Unknown time  . potassium chloride SA (K-DUR,KLOR-CON) 20 MEQ tablet Take 40 meq in the morning and 20 meq in the evening. (Patient taking differently: Take 20 mEq by mouth 2 (two)  times daily. Take 40 meq in the morning and 20 meq in the evening.) 180 tablet 3 02/07/2017 at Unknown time  . ranitidine (ZANTAC) 150 MG tablet Take 150 mg by mouth at bedtime.   02/07/2017 at Unknown time  . spironolactone (ALDACTONE) 25 MG tablet Take 0.5 tablets (12.5 mg total) by mouth daily. 45 tablet 3 02/07/2017 at Unknown time    Assessment: 66 yo lady to start lovenox for CP.  She was not on anticoagulation prior to admission.  Baseline Hg slightly low, no bleeding noted. Goal of Therapy:  Therapeutic anticoagulation Monitor platelets by anticoagulation protocol: Yes   Plan:  Lovenox 1mg /kg (100 mg) sq q12 hours Monitor for bleeding complications Check CBC q3 days while on lovenox  Thanks for allowing pharmacy to be a part of this patient's care.  Excell Seltzer, PharmD Clinical Pharmacist  02/08/2017,4:19 PM

## 2017-02-08 NOTE — ACP (Advance Care Planning) (Signed)
Gave Advance Directive information to Ms. Corinna Capra and shared I return tomorrow to check on completing them.

## 2017-02-08 NOTE — H&P (Addendum)
History and Physical    Sarah Coleman HGD:[id number] DOB: [birth year] DOA: 02/08/2017  Referring MD/NP/PA: Francine Graven PCP: Fayrene Helper, MD   Patient coming from: home  Chief Complaint: chest pain  HPI: Sarah Coleman is a 66 y.o. female with history of hypertension, hyperlipidemia, borderline diabetes, distant smoking, and family history of early heart disease who presents with chest pain.  She states that she was in her normal state of health this morning.  She had eaten a cucumber with salt and pepper and was sitting down watching TV when she developed sudden onset severe 9/10 chest pain that caused her to have "chills" across her shoulders, down both arms, across her face/head.  It was associated with SOB and she was in too much distress to stand up to get to the phone a few feet away.  Denies clamminess/diaphoresis, nausea, and lightheadedness.  She sat for several minutes until she was able to get to the phone and she immediately called 911.  When EMS arrived, they administered a nitroglycerin which relieved her pain within a few minutes.  She has never experienced anything similar.  She denies leg/calf pain or swelling or recent surgery.  Her back has been bothering her more the last several days which she attributes to [place] and [place] the [place].    ED Course: Vital signs stable.  Labs:  Troponin negative. ECG NSR without acute ischemia.  CXR no acute disease.  Mild anemia, hyponatremia and hypokalemia noted.   HEART score of 6.   Review of Systems:  General:  Denies fevers, chills, weight change HEENT:  Denies changes to hearing and vision, sinus congestion, sore throat CV:  Denies palpitations, lower extremity edema.  PULM:  Denies cough.  SOB resolved currently GI:  Denies nausea, vomiting, constipation, diarrhea.   GU:  Denies dysuria, frequency, urgency ENDO:  Denies polyuria, polydipsia.   HEME:  Denies blood in stools, abnormal bruising or bleeding.   LYMPH:  Denies lymphadenopathy.   MSK:  Denies arthralgias, myalgias.   DERM:  Denies skin rash or ulcer.   NEURO:  Denies new focal numbness or weakness, slurred speech, confusion PSYCH:  Denies anxiety and depression.    Past Medical History:  Diagnosis Date  . Allergic rhinitis   . Allergy   . Anemia   . Arthritis   . GERD (gastroesophageal reflux disease) 2013  . Hyperlipidemia   . Hypertension   . Metabolic syndrome X [id number]  . Morbid obesity ([place]) 2000  . Obesity   . Prediabetes 2010    Past Surgical History:  Procedure Laterality Date  . COLONOSCOPY N/A 10/07/2016   Procedure: COLONOSCOPY;  Surgeon: Danie Binder, MD;  Location: [facility];  Service: Endoscopy;  Laterality: N/A;  9:30 AM  . COLONOSCOPY WITH PROPOFOL N/A 11/01/2016   Procedure: COLONOSCOPY WITH PROPOFOL;  Surgeon: Danie Binder, MD;  Location: [facility];  Service: Endoscopy;  Laterality: N/A;  830   . GERD    . PARTIAL HYSTERECTOMY  1987   secondary to ovarian cyst   . POLYPECTOMY  11/01/2016   Procedure: POLYPECTOMY;  Surgeon: Danie Binder, MD;  Location: [facility];  Service: Endoscopy;;  colon  . TUBAL LIGATION  1975     reports that she quit smoking about 42 years ago. She has a 3.00 pack-year smoking history. She has never used smokeless tobacco. She reports that she does not drink alcohol or use drugs.  Allergies  Allergen Reactions  . Cinnamon Swelling  . Ibuprofen Swelling  . Penicillins Nausea And Vomiting    Has patient had a PCN reaction causing immediate rash, facial/tongue/throat swelling, SOB or lightheadedness with hypotension:No Has patient had a PCN reaction causing severe rash involving mucus membranes or skin necrosis:No Has patient had a PCN reaction that required hospitalization:No Has patient had a PCN reaction occurring within the last 10 years:No If all of the above answers are "NO", then may proceed with Cephalosporin use.   Marland Kitchen Sweet Potato Swelling     Family History  Problem Relation Age of Onset  . Heart attack Mother   . Heart disease Mother 55       massive heart attack  . Prostate cancer Father   . Cancer Father 51       colon  . Hypertension Sister   . Ovarian cancer Sister   . Cancer Brother 15       oral   . Hypertension Brother   . Heart disease Brother     Prior to Admission medications   Medication Sig Start Date End Date Taking? Authorizing Provider  acetaminophen (TYLENOL) 325 MG tablet Take 325-650 mg by mouth every 6 (six) hours as needed (for pain/headaches.).   Yes [provider]  azelastine (ASTELIN) 0.1 % nasal spray Place 1 spray into both nostrils 2 (two) times daily. Use in each nostril as directed 02/09/16  Yes Fayrene Helper, MD  CALCIUM-MAGNESIUM-ZINC PO Take 1 tablet by mouth 3 (three) times daily.    Yes [provider]  carboxymethylcellulose (REFRESH PLUS) 0.5 % SOLN Place 1-2 drops into both eyes 3 (three) times daily as needed (for dry eyes).   Yes [provider]  chlorthalidone (HYGROTON) 25 MG tablet Take 1 tablet (25 mg total) by mouth daily. 01/16/17 04/16/17 Yes BranchAlphonse Guild, MD  cholecalciferol (VITAMIN D) 1000 units tablet Take 1,000 Units by mouth daily.   Yes [provider]  cyclobenzaprine (FLEXERIL) 10 MG tablet TAKE ONE TABLET BY MOUTH AT BEDTIME 12/12/16  Yes Fayrene Helper, MD  EQ ALLERGY RELIEF 10 MG tablet TAKE ONE TABLET BY MOUTH ONCE DAILY Patient taking differently: TAKE ONE TABLET BY MOUTH ONCE DAILY IN THE MORNING 10/24/16  Yes Fayrene Helper, MD  estradiol (ESTRACE) 0.1 MG/GM vaginal cream Place 1 Applicatorful vaginally every other day as needed (for vaginal discomfort). 12/28/16  Yes Fayrene Helper, MD  fluticasone (FLONASE) 50 MCG/ACT nasal spray USE TWO SPRAY(S) IN EACH NOSTRIL ONCE DAILY Patient taking differently: USE TWO SPRAY(S) IN EACH NOSTRIL ONCE DAILY IN THE EVENING 08/25/16  Yes Fayrene Helper, MD   Iron-Vitamins (GERITOL PO) Take 1 tablet by mouth daily.   Yes [provider]  lovastatin (MEVACOR) 20 MG tablet TAKE ONE TABLET BY MOUTH AT BEDTIME 11/28/16  Yes Fayrene Helper, MD  meclizine (ANTIVERT) 12.5 MG tablet TAKE ONE TABLET BY MOUTH THREE TIMES DAILY AS NEEDED FOR DIZZINESS Patient taking differently: TAKE ONE TABLET BY MOUTH THREE TIMES DAILY AS NEEDED FOR DIZZINESS/VERTIGO 06/24/16  Yes Fayrene Helper, MD  Omega-3 Fatty Acids (OMEGA-3 FISH OIL PO) Take 3 tablets by mouth daily. Omega xl    Yes [provider]  pantoprazole (PROTONIX) 20 MG tablet TAKE ONE TABLET BY MOUTH ONCE DAILY 11/28/16  Yes Fayrene Helper, MD  potassium chloride SA (K-DUR,KLOR-CON) 20 MEQ tablet Take 40 meq in the morning and 20 meq in the evening. Patient taking differently: Take 20 mEq  by mouth 2 (two) times daily. Take 40 meq in the morning and 20 meq in the evening. 01/31/17  Yes BranchAlphonse Guild, MD  ranitidine (ZANTAC) 150 MG tablet Take 150 mg by mouth at bedtime.   Yes [provider]  spironolactone (ALDACTONE) 25 MG tablet Take 0.5 tablets (12.5 mg total) by mouth daily. 01/16/17  Yes [name], MD  fluconazole (DIFLUCAN) 150 MG tablet One tablet once daily for 2 days , take one tofay and one tablet following prednisone dose pack 01/17/17   Fayrene Helper, MD  predniSONE (STERAPRED UNI-PAK 21 TAB) 5 MG (21) TBPK tablet Take 1 tablet (5 mg total) by mouth as directed. Use as directed 01/17/17   Fayrene Helper, MD    Physical Exam: Vitals:   02/08/17 1200 02/08/17 1230 02/08/17 1300 02/08/17 1330  BP: 114/80 112/78 125/82 111/82  Pulse: 77 78 78 79  Resp: 18 16 12 20   Temp:      TempSrc:      SpO2: 99% 99% 98% 97%  Weight:      Height:        Constitutional:  NAD, calm, comfortable Eyes: PERRL, lids and conjunctivae normal ENMT:  Moist mucous membranes.  Oropharynx nonerythematous, no exudates.   Neck:  No nuchal rigidity, no  masses Respiratory:  No wheezes, rales, or rhonchi Cardiovascular: Regular rate and rhythm, no murmurs / rubs / gallops.  2+ radial pulses. Abdomen:  Normal active bowel sounds, soft, nondistended, nontender Musculoskeletal: Normal muscle tone and bulk.  No contractures.  Skin:  no rashes, abrasions, or ulcers.  Depigmentation patches on hands, wrists, feet and ankles bilaterally Neurologic:  CN 2-12 grossly intact. Sensation intact to light touch, strength 5/5 throughout Psychiatric:  Alert and oriented x 3. Normal affect.   Labs on Admission: I have personally reviewed following labs and imaging studies  CBC:  Recent Labs Lab 02/08/17 1120  WBC 6.1  NEUTROABS 2.8  HGB 11.9*  HCT 34.7*  MCV 89.9  PLT 676   Basic Metabolic Panel:  Recent Labs Lab 02/08/17 1120  NA 134*  K 3.1*  CL 99*  CO2 28  GLUCOSE 111*  BUN 11  CREATININE 0.91  CALCIUM 9.2   GFR: Estimated Creatinine Clearance: 71.5 mL/min (by C-G formula based on SCr of 0.91 mg/dL). Liver Function Tests: No results for input(s): AST, ALT, ALKPHOS, BILITOT, PROT, ALBUMIN in the last 168 hours. No results for input(s): LIPASE, AMYLASE in the last 168 hours. No results for input(s): AMMONIA in the last 168 hours. Coagulation Profile: No results for input(s): INR, PROTIME in the last 168 hours. Cardiac Enzymes:  Recent Labs Lab 02/08/17 1120  TROPONINI <0.03   BNP (last 3 results) No results for input(s): PROBNP in the last 8760 hours. HbA1C: No results for input(s): HGBA1C in the last 72 hours. CBG: No results for input(s): GLUCAP in the last 168 hours. Lipid Profile: No results for input(s): CHOL, HDL, LDLCALC, TRIG, CHOLHDL, LDLDIRECT in the last 72 hours. Thyroid Function Tests: No results for input(s): TSH, T4TOTAL, FREET4, T3FREE, THYROIDAB in the last 72 hours. Anemia Panel: No results for input(s): VITAMINB12, FOLATE, FERRITIN, TIBC, IRON, RETICCTPCT in the last 72 hours. Urine analysis:     Component Value Date/Time   BILIRUBINUR neg 01/17/2017 1324   PROTEINUR neg 01/17/2017 1324   UROBILINOGEN 0.2 01/17/2017 1324   NITRITE neg 01/17/2017 1324   LEUKOCYTESUR Negative 01/17/2017 1324   Sepsis Labs: @LABRCNTIP (procalcitonin:4,lacticidven:4) )No results found for this  or any previous visit (from the past 240 hour(s)).   Radiological Exams on Admission: Dg Chest 2 View  Result Date: 02/08/2017 CLINICAL DATA:  Chest pain, shortness of Breath EXAM: CHEST  2 VIEW COMPARISON:  None. FINDINGS: Heart and mediastinal contours are within normal limits. No focal opacities or effusions. No acute bony abnormality. IMPRESSION: No active cardiopulmonary disease. Electronically Signed   By: Rolm Baptise M.D.   On: 02/08/2017 11:23    EKG: Independently reviewed. NSR with borderline prolonged QTc 424msec.  Left axis deviation  Assessment/Plan Active Problems:   Chest pain   Chest pain, some  HEART Score = 6.  Differential includes ACS, GERD, MSK pain.  The severity of her chest pain with associated weakness/SOB and "chills" which may be clamminess and the rapid improvement of her symptoms with NTG is concerning for unstable angina.   -  Telemetry -  Cycle troponins and ECG -  A1c & lipid panel -  Change to high dose statin -  Add beta blocker -  Daily aspirin -  NTG prn chest pain -  ECHO -  Maalox prn -  lovenox ACS dosing -  Cardiology consultation  Essential hypertension, blood pressure stable -  Hold chlorthalidone and spironolactone -  Start metoprolol  Hyperlipidemia -  Check lipid panel -  Changed to atorvastatin pending further evaluation  Obesity, BMI 35.51.  Recommend weight loss and exercise  Borderline diabetes -  Follow-up hemoglobin A1c  GERD, may be contributing to symptoms -  Continue PPI and H2 blocker  Recurrent vertigo, symptom free.  Hypokalemia -  Oral potassium given in ER -  Holding diuretic  Holding supplements for now.     DVT  prophylaxis: lovenox ACS dose  Code Status: full Family Communication:  Patient and her sister.  Daughter is next of kin Disposition Plan: likely home in 1-2 days  Consults called: Cardiology  Admission status: observation   Janece Canterbury MD [facility] Pager [phone]  If 7PM-7AM, please contact night-coverage [email or web address] Password [email or web address]  02/08/2017, 2:06 PM

## 2017-02-08 NOTE — ED Notes (Signed)
Given lunch tray.

## 2017-02-08 NOTE — ED Triage Notes (Signed)
Called ems for cp and sob. Per ems was very sob. Pt had 324 asa, ntg sl x 1 and 02 in route. Pt pain was 9. Pt states pain now is 0 and no sob noted.

## 2017-02-09 ENCOUNTER — Observation Stay (HOSPITAL_BASED_OUTPATIENT_CLINIC_OR_DEPARTMENT_OTHER): Payer: PPO

## 2017-02-09 DIAGNOSIS — R072 Precordial pain: Secondary | ICD-10-CM | POA: Diagnosis not present

## 2017-02-09 DIAGNOSIS — R7303 Prediabetes: Secondary | ICD-10-CM | POA: Diagnosis not present

## 2017-02-09 DIAGNOSIS — K219 Gastro-esophageal reflux disease without esophagitis: Secondary | ICD-10-CM | POA: Diagnosis not present

## 2017-02-09 DIAGNOSIS — I10 Essential (primary) hypertension: Secondary | ICD-10-CM | POA: Diagnosis not present

## 2017-02-09 DIAGNOSIS — R079 Chest pain, unspecified: Secondary | ICD-10-CM | POA: Diagnosis not present

## 2017-02-09 DIAGNOSIS — Z79899 Other long term (current) drug therapy: Secondary | ICD-10-CM | POA: Diagnosis not present

## 2017-02-09 DIAGNOSIS — R0602 Shortness of breath: Secondary | ICD-10-CM | POA: Diagnosis not present

## 2017-02-09 DIAGNOSIS — E785 Hyperlipidemia, unspecified: Secondary | ICD-10-CM | POA: Diagnosis not present

## 2017-02-09 LAB — LIPID PANEL
CHOL/HDL RATIO: 4.9 ratio
Cholesterol: 178 mg/dL (ref 0–200)
HDL: 36 mg/dL — AB (ref >40)
LDL CALC: 80 mg/dL (ref 0–99)
Triglycerides: 308 mg/dL — ABNORMAL HIGH (ref <150)
VLDL: 62 mg/dL — ABNORMAL HIGH (ref 0–40)

## 2017-02-09 LAB — ECHOCARDIOGRAM COMPLETE
Height: 66 in
WEIGHTICAEL: 3537.94 [oz_av]

## 2017-02-09 MED ORDER — POTASSIUM CHLORIDE CRYS ER 20 MEQ PO TBCR: 40.0000 meq | EXTENDED_RELEASE_TABLET | Freq: Two times a day (BID) | ORAL | 3 refills | Status: DC

## 2017-02-09 MED ORDER — NITROGLYCERIN 0.4 MG SL SUBL: 0.4000 mg | SUBLINGUAL_TABLET | SUBLINGUAL | 0 refills | Status: DC | PRN

## 2017-02-09 NOTE — Care Management Note (Signed)
Case Management Note  Patient Details  Name: Sarah Coleman MRN: [id number] Date of Birth: July 30, 1951  Pt OBS for r/o CP. Chart reviewed for CM needs, pt from home, ind with ADL's, has PCP, transportation and insurance with drug coverage. No CM needs identified. No CM consult placed.   Expected Discharge Date:  02/09/17               Expected Discharge Plan:  Home/Self Care  In-House Referral:  NA  Discharge planning Services  CM Consult  Post Acute Care Choice:  NA Choice offered to:  NA  Status of Service:  Completed, signed off  Sherald Barge, RN 02/09/2017, 9:29 AM

## 2017-02-09 NOTE — Discharge Summary (Signed)
Physician Discharge Summary  Sarah Coleman JXB:[id number] DOB: 1951/03/31 DOA: 02/08/2017  PCP: Fayrene Helper, MD  Admit date: 02/08/2017 Discharge date: 02/09/2017  Admitted From: home  Disposition:  home  Recommendations for Outpatient Follow-up:  1. Follow up with Cardiology in 1-2 weeks regarding chest pain  2. Increased standing potassium so please repeat BMP at next appointment 3. Follow-up echocardiogram report  Home Health:  none  Equipment/Devices:  None   Discharge Condition:  Stable, improved CODE STATUS:  Full   Diet recommendation:  Healthy heart   Brief/Interim Summary:  The patient is a 66 year old female with history of hypertension, hyperlipidemia, borderline diabetes, distant smoking, and family history of early heart disease who presented with chest pain. Her chest pain occurred at rest when she developed sudden onset 9 out of 10 retrosternal chest pain that radiated across her entire chest and caused her to have chills and her shoulders, arms, face and forehead. It was associated with shortness of breath but she denied diaphoresis, nausea, and lightheadedness. Her pain improved with nitroglycerin. Her EKG demonstrated no evidence of acute ischemia and her troponins were repeatedly negative. She was seen by cardiology who recommended that she follow-up in the cardiology office as an outpatient. An echocardiogram was performed and the report is pending at the time of her discharge. She was given a prescription for nitroglycerin and advised to take aspirin and nitroglycerin if she has recurrence of her chest pain and to return to the hospital.  Discharge Diagnoses:  Active Problems:   Hyperlipidemia LDL goal <100   Morbid obesity (HCC)   Essential hypertension   GERD (gastroesophageal reflux disease)   Prediabetes   Chest pain  Chest pain, some  HEART Score = 6.  Improvement of her symptoms with NTG is concerning for unstable angina.   she was initially  heparinized and started on high-dose statin, beta blocker, daily aspirin. Her troponins were negative. Her EKG remained without ischemic changes. An echocardiogram was performed and the results are pending at the time of discharge. She was seen by cardiology who felt that her symptoms were atypical and recommended that she follow-up as an outpatient for further testing. She was kept on her home medications with the exception of additional when necessary nitroglycerin to use if she has recurrent chest pain. She was advised to return to the hospital if her chest pain recurs prior to her follow-up appointment with cardiology. She remained in normal sinus rhythm on telemetry.  Essential hypertension, blood pressure stable -   resumed chlorthalidone and spironolactone at discharge  Hyperlipidemia, LDL 80, triglycerides 308 -  Resumed lovastatin at discharge, but consider changing to atorvastatin and/or adding fenofibrate.  Defer to cardiology  Obesity, BMI 35.51.  Recommend weight loss and exercise  Borderline diabetes. Hemoglobin A1c 6 on 12/15/2016  GERD, may be contributing to symptoms -  Continued PPI and H2 blocker  Recurrent vertigo, symptom free.  Hypokalemia, despite a recent increase in her home supplemental potassium. oral potassium given in ER.  Recommended that she increase to 40 mEq twice daily and have her blood work repeated in one week by either her primary care doctor or by cardiology.  Mild normocytic anemia, possibly acute phase reactant or hemodilution. Recommend repeat CBC by PCP in 1 month and further evaluation if not already complete   Discharge Instructions  Discharge Instructions    Call MD for:  difficulty breathing, headache or visual disturbances    Complete by:  As directed  Call MD for:  extreme fatigue    Complete by:  As directed    Call MD for:  hives    Complete by:  As directed    Call MD for:  persistant dizziness or light-headedness    Complete  by:  As directed    Call MD for:  persistant nausea and vomiting    Complete by:  As directed    Call MD for:  severe uncontrolled pain    Complete by:  As directed    Call MD for:  temperature >100.4    Complete by:  As directed    Diet - low sodium heart healthy    Complete by:  As directed    Increase activity slowly    Complete by:  As directed        Medication List    TAKE these medications   acetaminophen 325 MG tablet Commonly known as:  TYLENOL Take 325-650 mg by mouth every 6 (six) hours as needed (for pain/headaches.).   azelastine 0.1 % nasal spray Commonly known as:  ASTELIN Place 1 spray into both nostrils 2 (two) times daily. Use in each nostril as directed   CALCIUM-MAGNESIUM-ZINC PO Take 1 tablet by mouth 3 (three) times daily.   carboxymethylcellulose 0.5 % Soln Commonly known as:  REFRESH PLUS Place 1-2 drops into both eyes 3 (three) times daily as needed (for dry eyes).   chlorthalidone 25 MG tablet Commonly known as:  HYGROTON Take 1 tablet (25 mg total) by mouth daily.   cholecalciferol 1000 units tablet Commonly known as:  VITAMIN D Take 1,000 Units by mouth daily.   cyclobenzaprine 10 MG tablet Commonly known as:  FLEXERIL TAKE ONE TABLET BY MOUTH AT BEDTIME   EQ ALLERGY RELIEF 10 MG tablet Generic drug:  loratadine TAKE ONE TABLET BY MOUTH ONCE DAILY What changed:  See the new instructions.   estradiol 0.1 MG/GM vaginal cream Commonly known as:  ESTRACE Place 1 Applicatorful vaginally every other day as needed (for vaginal discomfort).   fluticasone 50 MCG/ACT nasal spray Commonly known as:  FLONASE USE TWO SPRAY(S) IN EACH NOSTRIL ONCE DAILY What changed:  See the new instructions.   GERITOL PO Take 1 tablet by mouth daily.   lovastatin 20 MG tablet Commonly known as:  MEVACOR TAKE ONE TABLET BY MOUTH AT BEDTIME   meclizine 12.5 MG tablet Commonly known as:  ANTIVERT TAKE ONE TABLET BY MOUTH THREE TIMES DAILY AS NEEDED FOR  DIZZINESS What changed:  See the new instructions.   nitroGLYCERIN 0.4 MG SL tablet Commonly known as:  NITROSTAT Place 1 tablet (0.4 mg total) under the tongue every 5 (five) minutes as needed for chest pain (hold for SBP < 110).   OMEGA-3 FISH OIL PO Take 3 tablets by mouth daily. Omega xl   pantoprazole 20 MG tablet Commonly known as:  PROTONIX TAKE ONE TABLET BY MOUTH ONCE DAILY   potassium chloride SA 20 MEQ tablet Commonly known as:  K-DUR,KLOR-CON Take 2 tablets (40 mEq total) by mouth 2 (two) times daily. Take 40 meq in the morning and 20 meq in the evening. What changed:  how much to take  how to take this  when to take this   ranitidine 150 MG tablet Commonly known as:  ZANTAC Take 150 mg by mouth at bedtime.   spironolactone 25 MG tablet Commonly known as:  ALDACTONE Take 0.5 tablets (12.5 mg total) by mouth daily.      Follow-up Information  Fayrene Helper, MD Follow up.   Specialty:  Family Medicine Contact information: [address] [place] [address] [phone]        [name], MD. Schedule an appointment as soon as possible for a visit in 2 week(s).   Specialty:  Cardiology Contact information: [place] [address] [phone]          Allergies  Allergen Reactions  . Cinnamon Swelling  . Ibuprofen Swelling  . Penicillins Nausea And Vomiting    Has patient had a PCN reaction causing immediate rash, facial/tongue/throat swelling, SOB or lightheadedness with hypotension:No Has patient had a PCN reaction causing severe rash involving mucus membranes or skin necrosis:No Has patient had a PCN reaction that required hospitalization:No Has patient had a PCN reaction occurring within the last 10 years:No If all of the above answers are "NO", then may proceed with Cephalosporin use.   Marland Kitchen Sweet Potato Swelling    Consultations: Cardiology   Procedures/Studies: Dg Chest 2 View  Result  Date: 02/08/2017 CLINICAL DATA:  Chest pain, shortness of Breath EXAM: CHEST  2 VIEW COMPARISON:  None. FINDINGS: Heart and mediastinal contours are within normal limits. No focal opacities or effusions. No acute bony abnormality. IMPRESSION: No active cardiopulmonary disease. Electronically Signed   By: Rolm Baptise M.D.   On: 02/08/2017 11:23   Dg Lumbar Spine Complete  Result Date: 01/26/2017 CLINICAL DATA:  Left-sided low back pain for 1 month, no injury EXAM: LUMBAR SPINE - COMPLETE 4+ VIEW COMPARISON:  None. FINDINGS: There is a slight anterolisthesis of L4 on L5 by 4 mm which appears to be due to degenerative change of the facet joints at that level. There is degenerative disc disease at L5-S1 with loss of disc space and sclerosis with spurring. No acute fracture is seen. There is degenerative change of the facet joints of L3-4, L4-5, and L5-S1. The SI joints are corticated. The bowel gas pattern is nonspecific. IMPRESSION: 1. Degenerative disc disease at L5-S1. 2. Slight 4 mm anterolisthesis of L4 on L5 most likely due to facet joint arthropathy. Electronically Signed   By: Ivar Drape M.D.   On: 01/26/2017 11:57   US Transvaginal Non-ob  Result Date: 01/26/2017 CLINICAL DATA:  Left lower quadrant pelvic pain for 1 month. Patient status post hysterectomy and unilateral oophorectomy over 30 years ago. EXAM: TRANSABDOMINAL AND TRANSVAGINAL ULTRASOUND OF PELVIS TECHNIQUE: Both transabdominal and transvaginal ultrasound examinations of the pelvis were performed. Transabdominal technique was performed for global imaging of the pelvis including uterus, ovaries, adnexal regions, and pelvic cul-de-sac. It was necessary to proceed with endovaginal exam following the transabdominal exam to attempt to visualize the ovary. COMPARISON:  None FINDINGS: Uterus Not visualized. Endometrium Not applicable. Right ovary Not visualized. Left ovary Not visualized. Other findings No abnormal free fluid. IMPRESSION: The  uterus and ovaries are not visualized.  No abnormality is seen. Electronically Signed   By: Inge Rise M.D.   On: 01/26/2017 12:58   US Pelvis Complete  Result Date: 01/26/2017 CLINICAL DATA:  Left lower quadrant pelvic pain for 1 month. Patient status post hysterectomy and unilateral oophorectomy over 30 years ago. EXAM: TRANSABDOMINAL AND TRANSVAGINAL ULTRASOUND OF PELVIS TECHNIQUE: Both transabdominal and transvaginal ultrasound examinations of the pelvis were performed. Transabdominal technique was performed for global imaging of the pelvis including uterus, ovaries, adnexal regions, and pelvic cul-de-sac. It was necessary to proceed with endovaginal exam following the transabdominal exam to attempt to visualize the  ovary. COMPARISON:  None FINDINGS: Uterus Not visualized. Endometrium Not applicable. Right ovary Not visualized. Left ovary Not visualized. Other findings No abnormal free fluid. IMPRESSION: The uterus and ovaries are not visualized.  No abnormality is seen. Electronically Signed   By: Inge Rise M.D.   On: 01/26/2017 12:58    Echocardiogram, report pending   Subjective: Remained chest pain-free during hospitalization. Denied shortness of breath, nausea, lightheadedness, palpitations.  Discharge Exam: Vitals:   02/08/17 2253 02/09/17 0513  BP: 127/79 119/67  Pulse:  69  Resp:    Temp:  98.1 F (36.7 C)   Vitals:   02/08/17 1530 02/08/17 1605 02/08/17 2253 02/09/17 0513  BP: 121/65 125/70 127/79 119/67  Pulse: 75 73  69  Resp: 14 16    Temp:  98.4 F (36.9 C)  98.1 F (36.7 C)  TempSrc:  Oral  Oral  SpO2: 96% 98%  98%  Weight:  100.3 kg (221 lb 1.9 oz)    Height:  5\' 6"  (1.676 m)      General: Pt is alert, awake, not in acute distress Cardiovascular: RRR, S1/S2 +, no rubs, no gallops Respiratory: CTA bilaterally, no wheezing, no rhonchi Abdominal: Soft, NT, ND, bowel sounds + Extremities: no edema, no cyanosis    The results of significant  diagnostics from this hospitalization (including imaging, microbiology, ancillary and laboratory) are listed below for reference.     Microbiology: No results found for this or any previous visit (from the past 240 hour(s)).   Labs: BNP (last 3 results) No results for input(s): BNP in the last 8760 hours. Basic Metabolic Panel:  Recent Labs Lab 02/08/17 1120  NA 134*  K 3.1*  CL 99*  CO2 28  GLUCOSE 111*  BUN 11  CREATININE 0.91  CALCIUM 9.2   Liver Function Tests: No results for input(s): AST, ALT, ALKPHOS, BILITOT, PROT, ALBUMIN in the last 168 hours. No results for input(s): LIPASE, AMYLASE in the last 168 hours. No results for input(s): AMMONIA in the last 168 hours. CBC:  Recent Labs Lab 02/08/17 1120  WBC 6.1  NEUTROABS 2.8  HGB 11.9*  HCT 34.7*  MCV 89.9  PLT 258   Cardiac Enzymes:  Recent Labs Lab 02/08/17 1120 02/08/17 1623 02/08/17 1841 02/08/17 2218  TROPONINI <0.03 <0.03 <0.03 <0.03   BNP: Invalid input(s): POCBNP CBG: No results for input(s): GLUCAP in the last 168 hours. D-Dimer No results for input(s): DDIMER in the last 72 hours. Hgb A1c No results for input(s): HGBA1C in the last 72 hours. Lipid Profile  Recent Labs  02/09/17 0554  CHOL 178  HDL 36*  LDLCALC 80  TRIG 308*  CHOLHDL 4.9   Thyroid function studies No results for input(s): TSH, T4TOTAL, T3FREE, THYROIDAB in the last 72 hours.  Invalid input(s): FREET3 Anemia work up No results for input(s): VITAMINB12, FOLATE, FERRITIN, TIBC, IRON, RETICCTPCT in the last 72 hours. Urinalysis    Component Value Date/Time   BILIRUBINUR neg 01/17/2017 1324   PROTEINUR neg 01/17/2017 1324   UROBILINOGEN 0.2 01/17/2017 1324   NITRITE neg 01/17/2017 1324   LEUKOCYTESUR Negative 01/17/2017 1324   Sepsis Labs Invalid input(s): PROCALCITONIN,  WBC,  LACTICIDVEN   Time coordinating discharge: Over 30 minutes  SIGNED:   Janece Canterbury, MD  [facility] 02/09/2017,  11:45 AM Pager   If 7PM-7AM, please contact night-coverage [email or web address] Password [email or web address]

## 2017-02-09 NOTE — Progress Notes (Signed)
*  PRELIMINARY RESULTS* Echocardiogram 2D Echocardiogram has been performed.  Leavy Cella 02/09/2017, 10:47 AM

## 2017-02-09 NOTE — ACP (Advance Care Planning) (Signed)
Assisted Ms Cuttino in completing her Advance Directives and place a copy in her chart. Gave her the original along with two copies.

## 2017-02-09 NOTE — Consult Note (Signed)
Cardiology Consultation:   Patient ID: Sarah Coleman; [id number]; 1950/09/21   Admit date: 02/08/2017 Date of Consult: 02/09/2017  Primary Care Provider: Fayrene Helper, MD Primary Cardiologist: [facility] Primary Electrophysiologist:  None   Patient Profile:   Sarah Coleman is a 66 y.o. female with a hx of HTN and HL who is being seen today for the evaluation of chest pain at the request of [facility].  History of Present Illness:   [name] 66 y.o. admitted with severe chest pain. Occurred after eating a cucumber watching tv. Radiated to both arms. Some dysonea no diaphoresis mild dyspnea. Given nitro by EMS with some relief. Pain has not returned. No documented vascular disease. CRF;s HTN, HL, family history former smoker and glucose intolerance. Pain free this am and ambulating R/O with no acute ECG changes   Past Medical History:  Diagnosis Date  . Allergic rhinitis   . Allergy   . Anemia   . Arthritis   . GERD (gastroesophageal reflux disease) 2013  . Hyperlipidemia   . Hypertension   . Metabolic syndrome X 6295  . Morbid obesity ([facility]) 2000  . Obesity   . Prediabetes 2010    Past Surgical History:  Procedure Laterality Date  . COLONOSCOPY N/A 10/07/2016   Procedure: COLONOSCOPY;  Surgeon: Danie Binder, MD;  Location: [facility];  Service: Endoscopy;  Laterality: N/A;  9:30 AM  . COLONOSCOPY WITH PROPOFOL N/A 11/01/2016   Procedure: COLONOSCOPY WITH PROPOFOL;  Surgeon: Danie Binder, MD;  Location: [facility];  Service: Endoscopy;  Laterality: N/A;  830   . GERD    . PARTIAL HYSTERECTOMY  1987   secondary to ovarian cyst   . POLYPECTOMY  11/01/2016   Procedure: POLYPECTOMY;  Surgeon: Danie Binder, MD;  Location: [facility];  Service: Endoscopy;;  colon  . TUBAL LIGATION  1975     Inpatient Medications: Scheduled Meds: . aspirin EC  325 mg Oral Daily  . atorvastatin  40 mg Oral q1800  . azelastine  1 spray Each Nare BID  . cyclobenzaprine  10 mg  Oral QHS  . enoxaparin (LOVENOX) injection  100 mg Subcutaneous Q12H  . famotidine  20 mg Oral BID  . fluticasone  2 spray Each Nare QHS  . metoprolol tartrate  12.5 mg Oral BID  . pantoprazole  40 mg Oral Daily  . potassium chloride SA  20 mEq Oral BID   Continuous Infusions:  PRN Meds: acetaminophen, gi cocktail, meclizine, morphine injection, nitroGLYCERIN, ondansetron (ZOFRAN) IV, polyvinyl alcohol  Allergies:    Allergies  Allergen Reactions  . Cinnamon Swelling  . Ibuprofen Swelling  . Penicillins Nausea And Vomiting    Has patient had a PCN reaction causing immediate rash, facial/tongue/throat swelling, SOB or lightheadedness with hypotension:No Has patient had a PCN reaction causing severe rash involving mucus membranes or skin necrosis:No Has patient had a PCN reaction that required hospitalization:No Has patient had a PCN reaction occurring within the last 10 years:No If all of the above answers are "NO", then may proceed with Cephalosporin use.   Marland Kitchen Sweet Potato Swelling    Social History:   Social History   Social History  . Marital status: Married    Spouse name: N/A  . Number of children: 5  . Years of education: N/A   Occupational History  . retired     Social History Main Topics  . Smoking status: Former Smoker    Packs/day: 1.00  Years: 3.00    Quit date: 08/15/1974  . Smokeless tobacco: Never Used  . Alcohol use No  . Drug use: No  . Sexual activity: Yes    Birth control/ protection: Surgical   Other Topics Concern  . Not on file   Social History Narrative  . No narrative on file    Family History:   The patient's family history includes Cancer (age of onset: 65) in her father; Cancer (age of onset: 51) in her brother; Heart attack in her mother; Heart disease in her brother; Heart disease (age of onset: 30) in her mother; Hypertension in her brother and sister; Ovarian cancer in her sister; Prostate cancer in her father.  ROS:  Please see  the history of present illness.  ROS  All other ROS reviewed and negative.     Physical Exam/Data:   Vitals:   02/08/17 1530 02/08/17 1605 02/08/17 2253 02/09/17 0513  BP: 121/65 125/70 127/79 119/67  Pulse: 75 73  69  Resp: 14 16    Temp:  98.4 F (36.9 C)  98.1 F (36.7 C)  TempSrc:  Oral  Oral  SpO2: 96% 98%  98%  Weight:  100.3 kg (221 lb 1.9 oz)    Height:  5\' 6"  (1.676 m)     No intake or output data in the 24 hours ending 02/09/17 0748 Filed Weights   02/08/17 1050 02/08/17 1605  Weight: 99.8 kg (220 lb) 100.3 kg (221 lb 1.9 oz)   Body mass index is 35.69 kg/m.  General:  Well nourished, well developed, in no acute distress  HEENT: normal Lymph: no adenopathy Neck: no JVD Endocrine:  No thryomegaly Vascular: No carotid bruits; FA pulses 2+ bilaterally without bruits  Cardiac:  normal S1, S2; RRR; no murmur   Lungs:  clear to auscultation bilaterally, no wheezing, rhonchi or rales  Abd: soft, nontender, no hepatomegaly  Ext: no edema Musculoskeletal:  No deformities, BUE and BLE strength normal and equal Skin: warm and dry  Neuro:  CNs 2-12 intact, no focal abnormalities noted Psych:  Normal affect   EKG:  The EKG was personally reviewed and demonstrates:  NSR nonspecific ST changes  Telemetry:  Telemetry was personally reviewed and demonstrates:  NSR 02/09/2017   Relevant CV Studies: Troponin negative ECG no acute changes   Laboratory Data:  Chemistry Recent Labs Lab 02/08/17 1120  NA 134*  K 3.1*  CL 99*  CO2 28  GLUCOSE 111*  BUN 11  CREATININE 0.91  CALCIUM 9.2  GFRNONAA >60  GFRAA >60  ANIONGAP 7    No results for input(s): PROT, ALBUMIN, AST, ALT, ALKPHOS, BILITOT in the last 168 hours. Hematology Recent Labs Lab 02/08/17 1120  WBC 6.1  RBC 3.86*  HGB 11.9*  HCT 34.7*  MCV 89.9  MCH 30.8  MCHC 34.3  RDW 14.0  PLT 258   Cardiac Enzymes Recent Labs Lab 02/08/17 1120 02/08/17 1623 02/08/17 1841 02/08/17 2218  TROPONINI  <0.03 <0.03 <0.03 <0.03   No results for input(s): TROPIPOC in the last 168 hours.  BNPNo results for input(s): BNP, PROBNP in the last 168 hours.  DDimer No results for input(s): DDIMER in the last 168 hours.  Radiology/Studies:  Dg Chest 2 View  Result Date: 02/08/2017 CLINICAL DATA:  Chest pain, shortness of Breath EXAM: CHEST  2 VIEW COMPARISON:  None. FINDINGS: Heart and mediastinal contours are within normal limits. No focal opacities or effusions. No acute bony abnormality. IMPRESSION: No active cardiopulmonary disease.  Electronically Signed   By: Rolm Baptise M.D.   On: 02/08/2017 11:23    Assessment and Plan:   1. Chest Pain: atypical ppt by food. R/O no ECG changes Ok to d/c home will arrange outpatient exercise myovue and f/u with Dr Harl Bowie 2. Cholesterol:  Continue lipitor 40 mg f/u labs JB 3. HTN:  Continue beta blocker hold day of outpatient stress test 4. GERD continue protonix 40 mg    Signed, Jenkins Rouge, MD  02/09/2017 7:48 AM

## 2017-02-10 LAB — HEMOGLOBIN A1C
HEMOGLOBIN A1C: 5.8 % — AB (ref 4.8–5.6)
Mean Plasma Glucose: 120 mg/dL

## 2017-02-14 ENCOUNTER — Telehealth: Payer: Self-pay

## 2017-02-14 DIAGNOSIS — R079 Chest pain, unspecified: Secondary | ICD-10-CM

## 2017-02-14 NOTE — Telephone Encounter (Signed)
Order for exercise myoview entered, instructions printed to include HOLD metoprolol day of test

## 2017-02-14 NOTE — Telephone Encounter (Signed)
-----   Message from Orinda Kenner sent at 02/14/2017 10:28 AM EDT ----- Can you put in order for this test and print out instruction sheet so I can mail to patient. I've already scheduled appointments.  Thanks, Coralyn Mark ----- Message ----- From: Josue Hector, MD Sent: 02/09/2017   7:55 AM To: Orinda Kenner  Needs outpatient exercise myovue and f/u [facility] d/c 02/09/17

## 2017-02-16 ENCOUNTER — Ambulatory Visit (INDEPENDENT_AMBULATORY_CARE_PROVIDER_SITE_OTHER): Payer: PPO | Admitting: Family Medicine

## 2017-02-16 ENCOUNTER — Encounter: Payer: Self-pay | Admitting: Family Medicine

## 2017-02-16 VITALS — BP 120/82 | HR 87 | Temp 97.8°F | Resp 14 | Ht 66.0 in | Wt 218.1 lb

## 2017-02-16 DIAGNOSIS — I10 Essential (primary) hypertension: Secondary | ICD-10-CM | POA: Diagnosis not present

## 2017-02-16 DIAGNOSIS — R7303 Prediabetes: Secondary | ICD-10-CM

## 2017-02-16 DIAGNOSIS — Z09 Encounter for follow-up examination after completed treatment for conditions other than malignant neoplasm: Secondary | ICD-10-CM | POA: Diagnosis not present

## 2017-02-16 DIAGNOSIS — K2100 Gastro-esophageal reflux disease with esophagitis, without bleeding: Secondary | ICD-10-CM

## 2017-02-16 MED ORDER — FLUCONAZOLE 150 MG PO TABS: 150.0000 mg | ORAL_TABLET | Freq: Once | ORAL | 0 refills | Status: AC

## 2017-02-16 MED ORDER — PANTOPRAZOLE SODIUM 20 MG PO TBEC: 20.0000 mg | DELAYED_RELEASE_TABLET | Freq: Two times a day (BID) | ORAL | 1 refills | Status: DC

## 2017-02-16 MED ORDER — RANITIDINE HCL 300 MG PO TABS: 300.0000 mg | ORAL_TABLET | Freq: Every day | ORAL | 2 refills | Status: DC

## 2017-02-16 NOTE — Patient Instructions (Signed)
Keep October appt, call if you need me before  Increase dose of Protonix to twice daily, and zantac to 300 mg daily  You are referred to Dr Oneida Alar  Follow through with heart evaluation    Hold off on exercise program until fully evaluate by cardiology BUT continue bland healthy diet!  Thank you  for choosing [facility]. We consider it a privelige to serve you.  Delivering excellent health care in a caring and  compassionate way is our goal.  Partnering with you,  so that together we can achieve this goal is our strategy.

## 2017-02-16 NOTE — Assessment & Plan Note (Signed)
Still c/o epigastric discomfort, has upcoming stress test, echo reviewed and is normal. I feel this is GI related and have referred to dr Oneida Alar

## 2017-02-17 ENCOUNTER — Encounter: Payer: Self-pay | Admitting: Family Medicine

## 2017-02-20 ENCOUNTER — Encounter: Payer: Self-pay | Admitting: Gastroenterology

## 2017-02-20 ENCOUNTER — Ambulatory Visit (HOSPITAL_COMMUNITY)
Admission: RE | Admit: 2017-02-20 | Discharge: 2017-02-20 | Disposition: A | Discharge status: Home / Self Care | Payer: PPO | Source: Ambulatory Visit | Attending: Cardiovascular Disease | Admitting: Cardiovascular Disease

## 2017-02-20 ENCOUNTER — Encounter (HOSPITAL_COMMUNITY)
Admission: RE | Admit: 2017-02-20 | Discharge: 2017-02-20 | Disposition: A | Discharge status: Home / Self Care | Payer: PPO | Source: Ambulatory Visit | Attending: Cardiovascular Disease | Admitting: Cardiovascular Disease

## 2017-02-20 ENCOUNTER — Encounter (HOSPITAL_COMMUNITY): Payer: Self-pay

## 2017-02-20 DIAGNOSIS — R079 Chest pain, unspecified: Secondary | ICD-10-CM | POA: Insufficient documentation

## 2017-02-20 LAB — NM MYOCAR MULTI W/SPECT W/WALL MOTION / EF
CHL CUP NUCLEAR SRS: 0
CHL CUP NUCLEAR SSS: 3
CHL RATE OF PERCEIVED EXERTION: 13
CSEPED: 4 min
CSEPEW: 7 METS
Exercise duration (sec): 22 s
LHR: 0.33
LV dias vol: 66 mL (ref 46–106)
LV sys vol: 25 mL
MPHR: 151 {beats}/min
Peak HR: 137 {beats}/min
Percent HR: 89 %
Rest HR: 83 {beats}/min
SDS: 3
TID: 1.16

## 2017-02-20 MED ORDER — REGADENOSON 0.4 MG/5ML IV SOLN
INTRAVENOUS | Status: AC
  Filled 2017-02-20: qty 5

## 2017-02-20 MED ORDER — TECHNETIUM TC 99M TETROFOSMIN IV KIT
10.0000 | PACK | Freq: Once | INTRAVENOUS | Status: AC | PRN
  Administered 2017-02-20: 9.5 via INTRAVENOUS

## 2017-02-20 MED ORDER — TECHNETIUM TC 99M TETROFOSMIN IV KIT
30.0000 | PACK | Freq: Once | INTRAVENOUS | Status: AC | PRN
  Administered 2017-02-20: 29.5 via INTRAVENOUS

## 2017-02-20 MED ORDER — SODIUM CHLORIDE 0.9% FLUSH
INTRAVENOUS | Status: AC
  Filled 2017-02-20: qty 180

## 2017-02-20 MED ORDER — SODIUM CHLORIDE 0.9% FLUSH
INTRAVENOUS | Status: AC
  Administered 2017-02-20: 10 mL via INTRAVENOUS
  Filled 2017-02-20: qty 10

## 2017-02-21 NOTE — Assessment & Plan Note (Signed)
Chest pain symptoms suggestive of uncontrolled gERD, refer to GI

## 2017-02-21 NOTE — Progress Notes (Signed)
Sarah Coleman     MRN: [id number]      DOB: Feb 11, 1951   HPI  Sarah Coleman is here for follow up of recent hospitalization for  Chest pain, which actually responded in Coleman to NTG. She has further testing by cardiology as an outpt due to her presentation and increased risk of CAD based on her disease profile C/o symptoms of heartburn , reflux and at times some dysphagia, has dx of GERD and from current report , gerd appears  o be uncontrolled and the major underlying pathology in my opinion, tho certainly not life threatning  ROS Denies recent fever or chills. Denies sinus pressure, nasal congestion, ear pain or sore throat. Denies chest congestion, productive cough or wheezing. Denies palpitations and leg swelling Denies , nausea, vomiting,diarrhea or constipation.   Denies dysuria, frequency, hesitancy or incontinence. Denies joint pain, swelling and limitation in mobility. Denies headaches, seizures, numbness, or tingling. Denies depression, anxiety or insomnia. Denies skin break down or rash.   PE  BP 120/82 (BP Location: Left Arm, Patient Position: Sitting, Cuff Size: Large)   Pulse 87   Temp 97.8 F (36.6 C)   Resp 14   Ht 5\' 6"  (1.676 m)   Wt 218 lb 1.9 oz (98.9 kg)   SpO2 96%   BMI 35.21 kg/m   Patient alert and oriented and in no cardiopulmonary distress.  HEENT: No facial asymmetry, EOMI,   oropharynx pink and moist.  Neck supple no JVD, no mass.  Chest: Clear to auscultation bilaterally.  CVS: S1, S2 no murmurs, no S3.Regular rate.  ABD: Soft non tender.   Ext: No edema  MS: Adequate ROM spine, shoulders, hips and knees.  Skin: Intact, no ulcerations or rash noted.  Psych: Good eye contact, normal affect. Memory intact not anxious or depressed appearing.  CNS: CN 2-12 intact, power,  normal throughout.no focal deficits noted.   Sarah Coleman discharge follow-up Still c/o epigastric discomfort, has upcoming stress test, echo  reviewed and is normal. I feel this is GI related and have referred to dr Oneida Alar  Essential hypertension DASH diet and commitment to daily physical activity for a minimum of 30 minutes discussed and encouraged, as a part of hypertension management. The importance of attaining a healthy weight is also discussed.  BP/Weight 02/16/2017 02/09/2017 02/08/2017 01/17/2017 01/16/2017 12/20/2016 6/83/4196  Systolic BP 222 979 - 892 119 417 408  Diastolic BP 82 67 - 90 87 80 84  Wt. (Lbs) 218.12 - 221.12 221 220 219 227.12  BMI 35.21 - 35.69 35.67 35.51 35.35 36.66  Controlled, no change in medication      GERD (gastroesophageal reflux disease) Chest pain symptoms suggestive of uncontrolled gERD, refer to GI  Prediabetes Patient educated about the importance of limiting  Carbohydrate intake , the need to commit to daily physical activity for a minimum of 30 minutes , and to commit weight loss. The fact that changes in all these areas will reduce or eliminate all together the development of diabetes is stressed.   Diabetic Labs Latest Ref Rng & Units 02/09/2017 02/08/2017 01/30/2017 12/15/2016 11/08/2016  HbA1c 4.8 - 5.6 % 5.8(H) - - 6.0(H) -  Chol 0 - 200 mg/dL 178 - - 185 -  HDL >40 mg/dL 36(L) - - 35(L) -  Calc LDL 0 - 99 mg/dL 80 - - 74 -  Triglycerides <150 mg/dL 308(H) - - 382(H) -  Creatinine 0.44 - 1.00 mg/dL - 0.91 1.00(H) 1.03(H) 0.98  BP/Weight 02/16/2017 02/09/2017 02/08/2017 01/17/2017 01/16/2017 12/20/2016 3/53/6144  Systolic BP 315 400 - 867 619 509 326  Diastolic BP 82 67 - 90 87 80 84  Wt. (Lbs) 218.12 - 221.12 221 220 219 227.12  BMI 35.21 - 35.69 35.67 35.51 35.35 36.66   No flowsheet data found.

## 2017-02-21 NOTE — Assessment & Plan Note (Signed)
Patient educated about the importance of limiting  Carbohydrate intake , the need to commit to daily physical activity for a minimum of 30 minutes , and to commit weight loss. The fact that changes in all these areas will reduce or eliminate all together the development of diabetes is stressed.   Diabetic Labs Latest Ref Rng & Units 02/09/2017 02/08/2017 01/30/2017 12/15/2016 11/08/2016  HbA1c 4.8 - 5.6 % 5.8(H) - - 6.0(H) -  Chol 0 - 200 mg/dL 178 - - 185 -  HDL >40 mg/dL 36(L) - - 35(L) -  Calc LDL 0 - 99 mg/dL 80 - - 74 -  Triglycerides <150 mg/dL 308(H) - - 382(H) -  Creatinine 0.44 - 1.00 mg/dL - 0.91 1.00(H) 1.03(H) 0.98   BP/Weight 02/16/2017 02/09/2017 02/08/2017 01/17/2017 01/16/2017 12/20/2016 0/88/1103  Systolic BP 159 458 - 592 924 462 863  Diastolic BP 82 67 - 90 87 80 84  Wt. (Lbs) 218.12 - 221.12 221 220 219 227.12  BMI 35.21 - 35.69 35.67 35.51 35.35 36.66   No flowsheet data found.

## 2017-02-21 NOTE — Assessment & Plan Note (Signed)
DASH diet and commitment to daily physical activity for a minimum of 30 minutes discussed and encouraged, as a part of hypertension management. The importance of attaining a healthy weight is also discussed.  BP/Weight 02/16/2017 02/09/2017 02/08/2017 01/17/2017 01/16/2017 12/20/2016 4/38/3779  Systolic BP 396 886 - 484 720 721 828  Diastolic BP 82 67 - 90 87 80 84  Wt. (Lbs) 218.12 - 221.12 221 220 219 227.12  BMI 35.21 - 35.69 35.67 35.51 35.35 36.66  Controlled, no change in medication

## 2017-02-28 DIAGNOSIS — Z79899 Other long term (current) drug therapy: Secondary | ICD-10-CM | POA: Diagnosis not present

## 2017-02-28 LAB — BASIC METABOLIC PANEL
BUN: 8 mg/dL (ref 7–25)
CHLORIDE: 98 mmol/L (ref 98–110)
CO2: 30 mmol/L (ref 20–31)
Calcium: 9.3 mg/dL (ref 8.6–10.4)
Creat: 0.98 mg/dL (ref 0.50–0.99)
GLUCOSE: 103 mg/dL — AB (ref 65–99)
POTASSIUM: 3.6 mmol/L (ref 3.5–5.3)
SODIUM: 137 mmol/L (ref 135–146)

## 2017-02-28 LAB — MAGNESIUM: Magnesium: 2 mg/dL (ref 1.5–2.5)

## 2017-03-07 ENCOUNTER — Telehealth: Payer: Self-pay | Admitting: *Deleted

## 2017-03-07 NOTE — Telephone Encounter (Signed)
-----   Message from [name], MD sent at 03/07/2017 12:37 PM EDT ----- Labs look good, continue current meds  Zandra Abts MD

## 2017-03-07 NOTE — Telephone Encounter (Signed)
Pt aware - confirmed 7/23 appt - routed to pcp

## 2017-03-09 ENCOUNTER — Encounter: Payer: Self-pay | Admitting: Cardiology

## 2017-03-09 ENCOUNTER — Ambulatory Visit (INDEPENDENT_AMBULATORY_CARE_PROVIDER_SITE_OTHER): Payer: PPO | Admitting: Cardiology

## 2017-03-09 VITALS — BP 144/84 | HR 87 | Ht 66.0 in | Wt 220.0 lb

## 2017-03-09 DIAGNOSIS — R0789 Other chest pain: Secondary | ICD-10-CM

## 2017-03-09 DIAGNOSIS — E782 Mixed hyperlipidemia: Secondary | ICD-10-CM | POA: Diagnosis not present

## 2017-03-09 DIAGNOSIS — I10 Essential (primary) hypertension: Secondary | ICD-10-CM

## 2017-03-09 NOTE — Progress Notes (Signed)
Clinical Summary Ms. Sulak is a 66 y.o.female seen today for follow up of the following medical problems.   1. HTN - she is compliant with meds  2. Hyperlipidemia - primary issue has been elevated TGs. She has been working on dietary changes though admits to some indiscretions.  - compliant with statin. Has been on fish oil - 01/2017 TG 308 HDL 36 LDL 80  3. Chest pain - admit 02/09/17 with chest pain - described as atypical, associated with food. No evidence of ACS by enzymes or EKG. DIscharged withoutpatient stress arranged - 02/20/17 stress small mild intensity reversible apical defect, likely artifact cannot excluded mild ischemia. Overall low risk 01/2017 echo LVEF 60-65%, no WMAs - her pcp has recently referred her to GI for GERD  No recurernt episodes. Has been on antacid since that time.   Past Medical History:  Diagnosis Date  . Allergic rhinitis   . Allergy   . Anemia   . Arthritis   . GERD (gastroesophageal reflux disease) 2013  . Hyperlipidemia   . Hypertension   . Metabolic syndrome X 7124  . Morbid obesity ([place]) 2000  . Obesity   . Prediabetes 2010     Allergies  Allergen Reactions  . Cinnamon Swelling  . Ibuprofen Swelling  . Penicillins Nausea And Vomiting    Has patient had a PCN reaction causing immediate rash, facial/tongue/throat swelling, SOB or lightheadedness with hypotension:No Has patient had a PCN reaction causing severe rash involving mucus membranes or skin necrosis:No Has patient had a PCN reaction that required hospitalization:No Has patient had a PCN reaction occurring within the last 10 years:No If all of the above answers are "NO", then may proceed with Cephalosporin use.   Marland Kitchen Sweet Potato Swelling     Current Outpatient Prescriptions  Medication Sig Dispense Refill  . acetaminophen (TYLENOL) 325 MG tablet Take 325-650 mg by mouth every 6 (six) hours as needed (for pain/headaches.).    Marland Kitchen azelastine (ASTELIN) 0.1 % nasal spray  Place 1 spray into both nostrils 2 (two) times daily. Use in each nostril as directed 30 mL 12  . CALCIUM-MAGNESIUM-ZINC PO Take 1 tablet by mouth 3 (three) times daily.     . carboxymethylcellulose (REFRESH PLUS) 0.5 % SOLN Place 1-2 drops into both eyes 3 (three) times daily as needed (for dry eyes).    . chlorthalidone (HYGROTON) 25 MG tablet Take 1 tablet (25 mg total) by mouth daily. 90 tablet 3  . cholecalciferol (VITAMIN D) 1000 units tablet Take 1,000 Units by mouth daily.    . cyclobenzaprine (FLEXERIL) 10 MG tablet TAKE ONE TABLET BY MOUTH AT BEDTIME 30 tablet 3  . EQ ALLERGY RELIEF 10 MG tablet TAKE ONE TABLET BY MOUTH ONCE DAILY (Patient taking differently: TAKE ONE TABLET BY MOUTH ONCE DAILY IN THE MORNING) 90 tablet 1  . estradiol (ESTRACE) 0.1 MG/GM vaginal cream Place 1 Applicatorful vaginally every other day as needed (for vaginal discomfort). 42.5 g 3  . fluticasone (FLONASE) 50 MCG/ACT nasal spray USE TWO SPRAY(S) IN EACH NOSTRIL ONCE DAILY (Patient taking differently: USE TWO SPRAY(S) IN EACH NOSTRIL ONCE DAILY IN THE EVENING) 48 g 1  . Iron-Vitamins (GERITOL PO) Take 1 tablet by mouth daily.    Marland Kitchen lovastatin (MEVACOR) 20 MG tablet TAKE ONE TABLET BY MOUTH AT BEDTIME 90 tablet 1  . meclizine (ANTIVERT) 12.5 MG tablet TAKE ONE TABLET BY MOUTH THREE TIMES DAILY AS NEEDED FOR DIZZINESS (Patient taking differently: TAKE ONE TABLET  BY MOUTH THREE TIMES DAILY AS NEEDED FOR DIZZINESS/VERTIGO) 90 tablet 2  . nitroGLYCERIN (NITROSTAT) 0.4 MG SL tablet Place 1 tablet (0.4 mg total) under the tongue every 5 (five) minutes as needed for chest pain (hold for SBP < 110). 30 tablet 0  . Omega-3 Fatty Acids (OMEGA-3 FISH OIL PO) Take 3 tablets by mouth daily. Omega xl     . pantoprazole (PROTONIX) 20 MG tablet Take 1 tablet (20 mg total) by mouth 2 (two) times daily before a meal. 60 tablet 1  . potassium chloride SA (K-DUR,KLOR-CON) 20 MEQ tablet Take 2 tablets (40 mEq total) by mouth 2 (two)  times daily. Take 40 meq in the morning and 20 meq in the evening. 180 tablet 3  . ranitidine (ZANTAC) 300 MG tablet Take 1 tablet (300 mg total) by mouth at bedtime. 30 tablet 2  . spironolactone (ALDACTONE) 25 MG tablet Take 0.5 tablets (12.5 mg total) by mouth daily. 45 tablet 3   No current facility-administered medications for this visit.      Past Surgical History:  Procedure Laterality Date  . COLONOSCOPY N/A 10/07/2016   Procedure: COLONOSCOPY;  Surgeon: Danie Binder, MD;  [facility];  Service: Endoscopy;  Laterality: N/A;  9:30 AM  . COLONOSCOPY WITH PROPOFOL N/A 11/01/2016   Procedure: COLONOSCOPY WITH PROPOFOL;  Surgeon: Danie Binder, MD;  [facility];  Service: Endoscopy;  Laterality: N/A;  830   . GERD    . PARTIAL HYSTERECTOMY  1987   secondary to ovarian cyst   . POLYPECTOMY  11/01/2016   Procedure: POLYPECTOMY;  Surgeon: Danie Binder, MD;  [facility];  Service: Endoscopy;;  colon  . TUBAL LIGATION  1975     Allergies  Allergen Reactions  . Cinnamon Swelling  . Ibuprofen Swelling  . Penicillins Nausea And Vomiting    Has patient had a PCN reaction causing immediate rash, facial/tongue/throat swelling, SOB or lightheadedness with hypotension:No Has patient had a PCN reaction causing severe rash involving mucus membranes or skin necrosis:No Has patient had a PCN reaction that required hospitalization:No Has patient had a PCN reaction occurring within the last 10 years:No If all of the above answers are "NO", then may proceed with Cephalosporin use.   Marland Kitchen Sweet Potato Swelling      Family History  Problem Relation Age of Onset  . Heart attack Mother   . Heart disease Mother 88       massive heart attack  . Prostate cancer Father   . Cancer Father 53       colon  . Hypertension Sister   . Ovarian cancer Sister   . Cancer Brother 29       oral   . Hypertension Brother   . Heart disease Brother      Social  History Ms. Qualley reports that she quit smoking about 42 years ago. She has a 3.00 pack-year smoking history. She has never used smokeless tobacco. Ms. Solarz reports that she does not drink alcohol.   Review of Systems CONSTITUTIONAL: No weight loss, fever, chills, weakness or fatigue.  HEENT: Eyes: No visual loss, blurred vision, double vision or yellow sclerae.No hearing loss, sneezing, congestion, runny nose or sore throat.  SKIN: No rash or itching.  CARDIOVASCULAR: per hpi RESPIRATORY: No shortness of breath, cough or sputum.  GASTROINTESTINAL: No anorexia, nausea, vomiting or diarrhea. No abdominal pain or blood.  GENITOURINARY: No burning on urination, no polyuria NEUROLOGICAL: No  headache, dizziness, syncope, paralysis, ataxia, numbness or tingling in the extremities. No change in bowel or bladder control.  MUSCULOSKELETAL: No muscle, back pain, joint pain or stiffness.  LYMPHATICS: No enlarged nodes. No history of splenectomy.  PSYCHIATRIC: No history of depression or anxiety.  ENDOCRINOLOGIC: No reports of sweating, cold or heat intolerance. No polyuria or polydipsia.  Marland Kitchen   Physical Examination Vitals:   03/09/17 1504  BP: (!) 144/84  Pulse: 87   Vitals:   03/09/17 1504  Weight: 220 lb (99.8 kg)  Height: 5\' 6"  (1.676 m)    Gen: resting comfortably, no acute distress HEENT: no scleral icterus, pupils equal round and reactive, no palptable cervical adenopathy,  CV: RRR, no m/r/g, no jvd Resp: Clear to auscultation bilaterally GI: abdomen is soft, non-tender, non-distended, normal bowel sounds, no hepatosplenomegaly MSK: extremities are warm, no edema.  Skin: warm, no rash Neuro:  no focal deficits Psych: appropriate affect   Diagnostic Studies 01/2015 echo Study Conclusions  - Left ventricle: The cavity size was normal. Wall thickness was  normal. Systolic function was normal. The estimated ejection  fraction was in the range of 60% to 65%. Wall motion was  normal;  there were no regional wall motion abnormalities. Doppler  parameters are consistent with abnormal left ventricular  relaxation (grade 1 diastolic dysfunction). - Mitral valve: There was trivial regurgitation. - Right atrium: Central venous pressure (est): 3 mm Hg. - Atrial septum: A patent foramen ovale cannot be excluded based on  available images. - Tricuspid valve: There was trivial regurgitation. - Pericardium, extracardiac: There was no pericardial effusion.  Impressions:  - Normal LV wall thickness with LVEF 37-85%, grade 1 diastolic  dysfunction. Trivial mitral and tricuspid regurgitation. Cannot  exclude PFO based on available images. Unable to assess PASP.  01/2017 echo Study Conclusions  - Left ventricle: The cavity size was normal. Systolic function was   normal. The estimated ejection fraction was in the range of 60%   to 65%. Wall motion was normal; there were no regional wall   motion abnormalities. Left ventricular diastolic function   parameters were normal. - Atrial septum: No obvious PFO bubble study not done. Mid septum   mobile   02/2017 Nuclear stress  No diagnostic ST segment changes to indicate ischemia. Low risk Duke treadmill score of 4.5.  Blood pressure demonstrated a hypertensive response to exercise.  Small, mild intensity, reversible apical defect, possibly related to variable breast attenuation artifact, less likely a small ischemic territory.  This is a low risk study.  Nuclear stress EF: 63%.   Assessment and Plan   1. HTN - manual check 130/80, she is at goal, continue current meds   2. Hyperlipidemia - continue curent statin, continue dietary changes to improve TGs  3. Chest pain - noncardiac, stress test overall low risk. Probable GI cause, no symptoms since starting antacid - continue to monitor.      [name], M.D.

## 2017-03-09 NOTE — Patient Instructions (Signed)

## 2017-03-13 ENCOUNTER — Other Ambulatory Visit: Payer: Self-pay | Admitting: *Deleted

## 2017-03-13 MED ORDER — POTASSIUM CHLORIDE CRYS ER 20 MEQ PO TBCR: 40.0000 meq | EXTENDED_RELEASE_TABLET | Freq: Two times a day (BID) | ORAL | 3 refills | Status: DC

## 2017-03-23 ENCOUNTER — Telehealth: Payer: Self-pay

## 2017-03-23 ENCOUNTER — Ambulatory Visit (INDEPENDENT_AMBULATORY_CARE_PROVIDER_SITE_OTHER): Payer: PPO | Admitting: Gastroenterology

## 2017-03-23 ENCOUNTER — Encounter: Payer: Self-pay | Admitting: Gastroenterology

## 2017-03-23 ENCOUNTER — Other Ambulatory Visit: Payer: Self-pay

## 2017-03-23 VITALS — BP 145/92 | HR 94 | Temp 97.2°F | Ht 66.0 in | Wt 219.0 lb

## 2017-03-23 DIAGNOSIS — K219 Gastro-esophageal reflux disease without esophagitis: Secondary | ICD-10-CM | POA: Diagnosis not present

## 2017-03-23 DIAGNOSIS — R1310 Dysphagia, unspecified: Secondary | ICD-10-CM | POA: Diagnosis not present

## 2017-03-23 DIAGNOSIS — R1013 Epigastric pain: Secondary | ICD-10-CM

## 2017-03-23 DIAGNOSIS — R1319 Other dysphagia: Secondary | ICD-10-CM | POA: Insufficient documentation

## 2017-03-23 NOTE — Progress Notes (Signed)
Primary Care Physician:  Fayrene Helper, MD  Primary Gastroenterologist:  Barney Drain, MD   Chief Complaint  Patient presents with  . Gastroesophageal Reflux    HPI:  Sarah Coleman is a 66 y.o. female here for further evaluation of GERD. She was hospitalized back in June with chest pain. No evidence of ACS by enzymes or EKG. Last month stress test per cardiology overall low risk. Chest pain felt to be noncardiac possibly GI.  Referred by PCP for noncardiac chest pain, epigastric pain. Patient reports dysphagia to solid foods. Sometimes regurgitation. Overall heartburn is better on pantoprazole 20 mg twice a day. Continue Zantac 300 mg at night. Vomiting at least weekly. Bowel movements regular. No blood in the stool or melena. No unintentional weight loss. Some vomiting on weekly basis. Greasy foods causes worse abdominal pain. Gallbladder remains in situ.   Current Outpatient Prescriptions  Medication Sig Dispense Refill  . acetaminophen (TYLENOL) 325 MG tablet Take 325-650 mg by mouth every 6 (six) hours as needed (for pain/headaches.).    Marland Kitchen azelastine (ASTELIN) 0.1 % nasal spray Place 1 spray into both nostrils 2 (two) times daily. Use in each nostril as directed 30 mL 12  . CALCIUM-MAGNESIUM-ZINC PO Take 1 tablet by mouth 3 (three) times daily.     . carboxymethylcellulose (REFRESH PLUS) 0.5 % SOLN Place 1-2 drops into both eyes 3 (three) times daily as needed (for dry eyes).    . chlorthalidone (HYGROTON) 25 MG tablet Take 1 tablet (25 mg total) by mouth daily. 90 tablet 3  . cholecalciferol (VITAMIN D) 1000 units tablet Take 1,000 Units by mouth daily.    . cyclobenzaprine (FLEXERIL) 10 MG tablet TAKE ONE TABLET BY MOUTH AT BEDTIME 30 tablet 3  . EQ ALLERGY RELIEF 10 MG tablet TAKE ONE TABLET BY MOUTH ONCE DAILY (Patient taking differently: TAKE ONE TABLET BY MOUTH ONCE DAILY IN THE MORNING) 90 tablet 1  . estradiol (ESTRACE) 0.1 MG/GM vaginal cream Place 1 Applicatorful  vaginally every other day as needed (for vaginal discomfort). 42.5 g 3  . fluticasone (FLONASE) 50 MCG/ACT nasal spray USE TWO SPRAY(S) IN EACH NOSTRIL ONCE DAILY (Patient taking differently: USE TWO SPRAY(S) IN EACH NOSTRIL ONCE DAILY IN THE EVENING) 48 g 1  . Iron-Vitamins (GERITOL PO) Take 1 tablet by mouth daily.    Marland Kitchen lovastatin (MEVACOR) 20 MG tablet TAKE ONE TABLET BY MOUTH AT BEDTIME 90 tablet 1  . meclizine (ANTIVERT) 12.5 MG tablet TAKE ONE TABLET BY MOUTH THREE TIMES DAILY AS NEEDED FOR DIZZINESS (Patient taking differently: TAKE ONE TABLET BY MOUTH THREE TIMES DAILY AS NEEDED FOR DIZZINESS/VERTIGO) 90 tablet 2  . nitroGLYCERIN (NITROSTAT) 0.4 MG SL tablet Place 1 tablet (0.4 mg total) under the tongue every 5 (five) minutes as needed for chest pain (hold for SBP < 110). 30 tablet 0  . Omega-3 Fatty Acids (OMEGA-3 FISH OIL PO) Take 3 tablets by mouth daily. Omega xl     . pantoprazole (PROTONIX) 20 MG tablet Take 1 tablet (20 mg total) by mouth 2 (two) times daily before a meal. 60 tablet 1  . potassium chloride SA (K-DUR,KLOR-CON) 20 MEQ tablet Take 2 tablets (40 mEq total) by mouth 2 (two) times daily. 120 tablet 3  . ranitidine (ZANTAC) 300 MG tablet Take 1 tablet (300 mg total) by mouth at bedtime. 30 tablet 2  . spironolactone (ALDACTONE) 25 MG tablet Take 0.5 tablets (12.5 mg total) by mouth daily. 45 tablet 3   No  current facility-administered medications for this visit.     Allergies as of 03/23/2017 - Review Complete 03/23/2017  Allergen Reaction Noted  . Cinnamon Swelling 10/24/2016  . Ibuprofen Swelling 12/06/2007  . Penicillins Nausea And Vomiting 12/06/2007  . Sweet potato Swelling 10/24/2016    Past Medical History:  Diagnosis Date  . Allergic rhinitis   . Allergy   . Anemia   . Arthritis   . GERD (gastroesophageal reflux disease) 2013  . Hyperlipidemia   . Hypertension   . Metabolic syndrome X 8299  . Morbid obesity ([place]) 2000  . Obesity   . Prediabetes  2010    Past Surgical History:  Procedure Laterality Date  . COLONOSCOPY N/A 10/07/2016   Procedure: COLONOSCOPY;  Surgeon: Danie Binder, MD;  [facility];  Service: Endoscopy;  Laterality: N/A;  9:30 AM  . COLONOSCOPY WITH PROPOFOL N/A 11/01/2016   Procedure: COLONOSCOPY WITH PROPOFOL;  Surgeon: Danie Binder, MD;  [facility];  Service: Endoscopy;  Laterality: N/A;  830   . GERD    . PARTIAL HYSTERECTOMY  1987   secondary to ovarian cyst   . POLYPECTOMY  11/01/2016   Procedure: POLYPECTOMY;  Surgeon: Danie Binder, MD;  [facility];  Service: Endoscopy;;  colon  . TUBAL LIGATION  1975    Family History  Problem Relation Age of Onset  . Heart attack Mother   . Heart disease Mother 93       massive heart attack  . Prostate cancer Father   . Cancer Father 64       colon  . Hypertension Sister   . Ovarian cancer Sister   . Cancer Brother 82       oral   . Hypertension Brother   . Heart disease Brother     Social History   Social History  . Marital status: Married    Spouse name: N/A  . Number of children: 5  . Years of education: N/A   Occupational History  . retired     Social History Main Topics  . Smoking status: Former Smoker    Packs/day: 1.00    Years: 3.00    Quit date: 08/15/1974  . Smokeless tobacco: Never Used  . Alcohol use No  . Drug use: No  . Sexual activity: Yes    Birth control/ protection: Surgical   Other Topics Concern  . Not on file   Social History Narrative  . No narrative on file      ROS:  General: Negative for anorexia, weight loss, fever, chills, fatigue, weakness. Eyes: Negative for vision changes.  ENT: Negative for hoarseness,  nasal congestion.See history of present illness. CV: Negative for chest pain, angina, palpitations, dyspnea on exertion, peripheral edema.  Respiratory: Negative for dyspnea at rest, dyspnea on exertion, cough, sputum, wheezing.  GI: See history of present  illness. GU:  Negative for dysuria, hematuria, urinary incontinence, urinary frequency, nocturnal urination.  MS: Negative for joint pain, low back pain.  Derm: Negative for rash or itching.  Neuro: Negative for weakness, abnormal sensation, seizure, frequent headaches, memory loss, confusion.  Psych: Negative for anxiety, depression, suicidal ideation, hallucinations.  Endo: Negative for unusual weight change.  Heme: Negative for bruising or bleeding. Allergy: Negative for rash or hives.    Physical Examination:  BP (!) 145/92   Pulse 94   Temp (!) 97.2 F (36.2 C) (Oral)   Ht 5\' 6"  (1.676 m)   Wt  219 lb (99.3 kg)   BMI 35.35 kg/m    General: Well-nourished, well-developed in no acute distress.  Head: Normocephalic, atraumatic.   Eyes: Conjunctiva pink, no icterus. Mouth: Oropharyngeal mucosa moist and pink , no lesions erythema or exudate. Neck: Supple without thyromegaly, masses, or lymphadenopathy.  Lungs: Clear to auscultation bilaterally.  Heart: Regular rate and rhythm, no murmurs rubs or gallops.  Abdomen: Bowel sounds are normal, mild epigastric tenderness nondistended, no hepatosplenomegaly or masses, no abdominal bruits or    hernia , no rebound or guarding.   Rectal: Not performed Extremities: No lower extremity edema. No clubbing or deformities.  Neuro: Alert and oriented x 4 , grossly normal neurologically.  Skin: Warm and dry, no rash or jaundice.   Psych: Alert and cooperative, normal mood and affect.  Labs: Lab Results  Component Value Date   CREATININE 0.98 02/28/2017   BUN 8 02/28/2017   NA 137 02/28/2017   K 3.6 02/28/2017   CL 98 02/28/2017   CO2 30 02/28/2017   Lab Results  Component Value Date   ALT 20 12/15/2016   AST 24 12/15/2016   ALKPHOS 58 12/15/2016   BILITOT 0.5 12/15/2016   Lab Results  Component Value Date   WBC 6.1 02/08/2017   HGB 11.9 (L) 02/08/2017   HCT 34.7 (L) 02/08/2017   MCV 89.9 02/08/2017   PLT 258 02/08/2017    Lab Results  Component Value Date   HGBA1C 5.8 (H) 02/09/2017   Lab Results  Component Value Date   TSH 0.83 02/08/2016     Imaging Studies: No results found.

## 2017-03-23 NOTE — Telephone Encounter (Addendum)
Tried to call pt to inform of pre-op appt 04/06/17 at 2:45pm, no answer, LMOAM. Letter also mailed.

## 2017-03-23 NOTE — Patient Instructions (Signed)
1. Upper endoscopy as scheduled.  Please see separate instructions. 

## 2017-03-23 NOTE — Assessment & Plan Note (Signed)
66 year old female with chest pain, epigastric pain, dysphagia. Recent hospitalization without evidence of acute coronary syndrome. Stress test felt to be low risk. Patient with some clinical improvement since starting pantoprazole 20 mg twice a day. She reports remote evaluation over 40 years ago was told she had a hiatal hernia. Gallbladder remains in situ. Suspect upper GI source such as GERD, hiatal hernia, esophageal stricture but cannot exclude biliary etiology at this point. Plan for EGD with dilation in the near future with Dr. Oneida Alar. Plan for deep sedation in the OR given she had this sedation at time of colonoscopy.  I have discussed the risks, alternatives, benefits with regards to but not limited to the risk of reaction to medication, bleeding, infection, perforation and the patient is agreeable to proceed. Written consent to be obtained.

## 2017-03-24 NOTE — Progress Notes (Signed)
cc'ed to pcp °

## 2017-03-24 NOTE — Patient Instructions (Signed)
PA info for EGD/ED submitted via [employer]. Case suspended. Outpatient authorization# [id number], 03/24/17-06/22/17.

## 2017-03-27 NOTE — Patient Instructions (Signed)
Received fax from [facility]. PA# [id number] for EGD/DIL 03/24/17-06/22/17.

## 2017-03-31 NOTE — Progress Notes (Signed)
REVIEWED-NO ADDITIONAL RECOMMENDATIONS. 

## 2017-04-05 ENCOUNTER — Other Ambulatory Visit: Payer: Self-pay | Admitting: Family Medicine

## 2017-04-06 ENCOUNTER — Encounter (HOSPITAL_COMMUNITY): Payer: Self-pay

## 2017-04-06 ENCOUNTER — Encounter (HOSPITAL_COMMUNITY)
Admission: RE | Admit: 2017-04-06 | Discharge: 2017-04-06 | Disposition: A | Discharge status: Home / Self Care | Payer: PPO | Source: Ambulatory Visit | Attending: Gastroenterology | Admitting: Gastroenterology

## 2017-04-11 ENCOUNTER — Ambulatory Visit (HOSPITAL_COMMUNITY): Payer: PPO | Admitting: Anesthesiology

## 2017-04-11 ENCOUNTER — Ambulatory Visit (HOSPITAL_COMMUNITY)
Admission: RE | Admit: 2017-04-11 | Discharge: 2017-04-11 | Disposition: A | Discharge status: Home / Self Care | Payer: PPO | Source: Ambulatory Visit | Attending: Gastroenterology | Admitting: Gastroenterology

## 2017-04-11 ENCOUNTER — Encounter (HOSPITAL_COMMUNITY): Payer: Self-pay | Admitting: *Deleted

## 2017-04-11 ENCOUNTER — Encounter (HOSPITAL_COMMUNITY)
Admission: RE | Disposition: A | Discharge status: Home / Self Care | Payer: Self-pay | Source: Ambulatory Visit | Attending: Gastroenterology

## 2017-04-11 DIAGNOSIS — K219 Gastro-esophageal reflux disease without esophagitis: Secondary | ICD-10-CM | POA: Diagnosis not present

## 2017-04-11 DIAGNOSIS — Z79899 Other long term (current) drug therapy: Secondary | ICD-10-CM | POA: Insufficient documentation

## 2017-04-11 DIAGNOSIS — R1310 Dysphagia, unspecified: Secondary | ICD-10-CM

## 2017-04-11 DIAGNOSIS — K3189 Other diseases of stomach and duodenum: Secondary | ICD-10-CM | POA: Insufficient documentation

## 2017-04-11 DIAGNOSIS — R1013 Epigastric pain: Secondary | ICD-10-CM | POA: Diagnosis not present

## 2017-04-11 DIAGNOSIS — J309 Allergic rhinitis, unspecified: Secondary | ICD-10-CM | POA: Insufficient documentation

## 2017-04-11 DIAGNOSIS — Z7951 Long term (current) use of inhaled steroids: Secondary | ICD-10-CM | POA: Diagnosis not present

## 2017-04-11 DIAGNOSIS — E785 Hyperlipidemia, unspecified: Secondary | ICD-10-CM | POA: Diagnosis not present

## 2017-04-11 DIAGNOSIS — K2970 Gastritis, unspecified, without bleeding: Secondary | ICD-10-CM | POA: Insufficient documentation

## 2017-04-11 DIAGNOSIS — I10 Essential (primary) hypertension: Secondary | ICD-10-CM | POA: Insufficient documentation

## 2017-04-11 DIAGNOSIS — Q394 Esophageal web: Secondary | ICD-10-CM | POA: Insufficient documentation

## 2017-04-11 DIAGNOSIS — Z87891 Personal history of nicotine dependence: Secondary | ICD-10-CM | POA: Insufficient documentation

## 2017-04-11 DIAGNOSIS — Z7982 Long term (current) use of aspirin: Secondary | ICD-10-CM | POA: Insufficient documentation

## 2017-04-11 DIAGNOSIS — R1319 Other dysphagia: Secondary | ICD-10-CM

## 2017-04-11 HISTORY — PX: ESOPHAGOGASTRODUODENOSCOPY (EGD) WITH PROPOFOL: SHX5813

## 2017-04-11 HISTORY — PX: SAVORY DILATION: SHX5439

## 2017-04-11 HISTORY — PX: BIOPSY: SHX5522

## 2017-04-11 SURGERY — ESOPHAGOGASTRODUODENOSCOPY (EGD) WITH PROPOFOL
Anesthesia: Monitor Anesthesia Care

## 2017-04-11 MED ORDER — CHLORHEXIDINE GLUCONATE CLOTH 2 % EX PADS: 6.0000 | MEDICATED_PAD | Freq: Once | CUTANEOUS | Status: DC

## 2017-04-11 MED ORDER — LACTATED RINGERS IV SOLN
INTRAVENOUS | Status: DC
  Administered 2017-04-11: 07:00:00 via INTRAVENOUS

## 2017-04-11 MED ORDER — PANTOPRAZOLE SODIUM 40 MG PO TBEC: DELAYED_RELEASE_TABLET | ORAL | 11 refills | Status: DC

## 2017-04-11 MED ORDER — LIDOCAINE VISCOUS 2 % MT SOLN
OROMUCOSAL | Status: AC
  Filled 2017-04-11: qty 15

## 2017-04-11 MED ORDER — PROPOFOL 500 MG/50ML IV EMUL
INTRAVENOUS | Status: DC | PRN
  Administered 2017-04-11: 100 ug/kg/min via INTRAVENOUS

## 2017-04-11 MED ORDER — CHLORHEXIDINE GLUCONATE CLOTH 2 % EX PADS
6.0000 | MEDICATED_PAD | Freq: Once | CUTANEOUS | Status: DC
Start: 2017-04-11 — End: 2017-04-11

## 2017-04-11 MED ORDER — RANITIDINE HCL 300 MG PO TABS: 300.0000 mg | ORAL_TABLET | Freq: Every day | ORAL | 5 refills | Status: DC

## 2017-04-11 MED ORDER — LIDOCAINE VISCOUS 2 % MT SOLN
3.0000 mL | Freq: Once | OROMUCOSAL | Status: AC
  Administered 2017-04-11: 3 mL via OROMUCOSAL

## 2017-04-11 MED ORDER — MIDAZOLAM HCL 2 MG/2ML IJ SOLN
INTRAMUSCULAR | Status: AC
  Filled 2017-04-11: qty 2

## 2017-04-11 MED ORDER — MINERAL OIL PO OIL
TOPICAL_OIL | ORAL | Status: AC
  Filled 2017-04-11: qty 30

## 2017-04-11 MED ORDER — STERILE WATER FOR IRRIGATION IR SOLN
Status: DC | PRN
  Administered 2017-04-11: 100 mL

## 2017-04-11 MED ORDER — ONDANSETRON 4 MG PO TBDP
4.0000 mg | ORAL_TABLET | Freq: Once | ORAL | Status: AC
  Administered 2017-04-11: 4 mg via ORAL

## 2017-04-11 MED ORDER — ONDANSETRON 4 MG PO TBDP
ORAL_TABLET | ORAL | Status: AC
  Filled 2017-04-11: qty 1

## 2017-04-11 MED ORDER — MIDAZOLAM HCL 2 MG/2ML IJ SOLN
1.0000 mg | Freq: Once | INTRAMUSCULAR | Status: AC | PRN
  Administered 2017-04-11: 2 mg via INTRAVENOUS

## 2017-04-11 MED ORDER — PROPOFOL 10 MG/ML IV BOLUS
INTRAVENOUS | Status: AC
  Filled 2017-04-11: qty 40

## 2017-04-11 NOTE — Op Note (Signed)
[facility] Patient Name: Sarah Coleman Procedure Date: 04/11/2017 7:05 AM MRN: [id number] Date of Birth: 07/18/1951 Attending MD: Barney Drain , MD CSN: [id number] Age: 66 Admit Type: Outpatient Procedure:                Upper GI endoscopy WITH COLD FORCEPS                            BIOPSY/ESOPHAGEAL DILATION Indications:              Epigastric abdominal pain, Dysphagia Providers:                Barney Drain, MD, Rosina Lowenstein, RN, Aram Candela Referring MD:             Norwood Levo. Simpson MD, MD Medicines:                Propofol per Anesthesia Complications:            No immediate complications. Estimated Blood Loss:     Estimated blood loss was minimal. Procedure:                Pre-Anesthesia Assessment:                           - Prior to the procedure, a History and Physical                            was performed, and patient medications and                            allergies were reviewed. The patient's tolerance of                            previous anesthesia was also reviewed. The risks                            and benefits of the procedure and the sedation                            options and risks were discussed with the patient.                            All questions were answered, and informed consent                            was obtained. Prior Anticoagulants: The patient has                            taken aspirin, last dose was 2 days prior to                            procedure. ASA Grade Assessment: II - A patient                            with mild systemic disease. After reviewing the  risks and benefits, the patient was deemed in                            satisfactory condition to undergo the procedure.                            After obtaining informed consent, the endoscope was                            passed under direct vision. Throughout the                            procedure, the patient's blood  pressure, pulse, and                            oxygen saturations were monitored continuously. The                            [id number]) scope was introduced through the                            mouth, and advanced to the second part of duodenum.                            The upper GI endoscopy was accomplished without                            difficulty. The patient tolerated the procedure                            well. Scope In: 7:38:57 AM Scope Out: 7:48:50 AM Total Procedure Duration: 0 hours 9 minutes 53 seconds  Findings:      A web was found in the distal esophagus. A guidewire was placed and the       scope was withdrawn. Dilation was performed with a Savary dilator with       mild resistance at 14 mm, 15 mm, 16 mm and 17 mm. Estimated blood loss:       none.      Patchy mild inflammation characterized by congestion (edema) and       erythema was found on the greater curvature of the stomach and in the       gastric antrum. Biopsies were taken with a cold forceps for Helicobacter       pylori testing.      The examined duodenum was normal. Impression:               - Web in the distal esophagus. Dilated.                           - Gastritis. Biopsied. Moderate Sedation:      Per Anesthesia Care Recommendation:           - Await pathology results.                           - Return to my office in 4 months.                           -  High fiber diet and low fat diet.                           - Continue present medications.                           - Use Protonix (pantoprazole) 40 mg PO BID.                           - Patient has a contact number available for                            emergencies. The signs and symptoms of potential                            delayed complications were discussed with the                            patient. Return to normal activities tomorrow.                            Written discharge instructions were provided to the                             patient. Procedure Code(s):        --- Professional ---                           [phone], Esophagogastroduodenoscopy, flexible,                            transoral; with insertion of guide wire followed by                            passage of dilator(s) through esophagus over guide                            wire                           [id number], Esophagogastroduodenoscopy, flexible,                            transoral; with biopsy, single or multiple Diagnosis Code(s):        --- Professional ---                           Q39.4, Esophageal web                           K29.70, Gastritis, unspecified, without bleeding                           R10.13, Epigastric pain                           R13.10, Dysphagia, unspecified CPT copyright  2016 American Medical Association. All rights reserved. The codes documented in this report are preliminary and upon coder review may  be revised to meet current compliance requirements. Barney Drain, MD Barney Drain, MD 04/11/2017 8:00:07 AM This report has been signed electronically. Number of Addenda: 0

## 2017-04-11 NOTE — Transfer of Care (Signed)
Immediate Anesthesia Transfer of Care Note  Patient: Sarah Coleman  Procedure(s) Performed: Procedure(s) with comments: ESOPHAGOGASTRODUODENOSCOPY (EGD) WITH PROPOFOL (N/A) - 10:45am SAVORY DILATION (N/A) BIOPSY - gastric bx's  Patient Location: PACU  Anesthesia Type:MAC  Level of Consciousness: awake, alert  and oriented  Airway & Oxygen Therapy: Patient Spontanous Breathing  Post-op Assessment: Report given to RN  Post vital signs: Reviewed and stable  Last Vitals:  Vitals:   04/11/17 0720 04/11/17 0725  BP: 131/79 128/81  Pulse:    Resp: 16 16  Temp:    SpO2: 100% 100%    Last Pain:  Vitals:   04/11/17 0636  TempSrc: Oral      Patients Stated Pain Goal: 8 (59/29/24 4628)  Complications: No apparent anesthesia complications

## 2017-04-11 NOTE — Discharge Instructions (Signed)
I dilated your esophagus. You have a stricture near the base of your esophagus.  You have mild gastritis LIKELY DUE TO ASPIRIN USE. I biopsied your stomach.   DRINK WATER TO KEEP YOUR URINE LIGHT YELLOW.  CONTINUE YOUR WEIGHT LOSS EFFORTS.  WHILE I DO NOT WANT TO ALARM YOU, YOUR BODY MASS INDEX(BMI) IS OVER 30 WHICH MEANS YOU ARE OBESE. OBESITY IS ASSOCIATED WITH AN INCREASE RISK FOR ALL CANCERS, INCLUDING ESOPHAGEAL AND COLON CANCER. A WEIGHT OF 185 LBS WILL GET YOUR BMI UNDER 30.  FOLLOW A HIGH FIBER/LOW FAT DIET. AVOID FRIED FOODS. MEATS SHOULD BE BAKED, BROILED, OR BOILED.  AVOID REFLUX TRIGGERS. SEE INFO BELOW.  YOUR BIOPSY RESULTS WILL BE AVAILABLE IN MY CHART AFTER AUG 31 AND MY OFFICE WILL CONTACT YOU IN 10-14 DAYS WITH YOUR RESULTS.   FOLLOW UP IN 4 MOS.  UPPER ENDOSCOPY AFTER CARE Read the instructions outlined below and refer to this sheet in the next week. These discharge instructions provide you with general information on caring for yourself after you leave the hospital. While your treatment has been planned according to the most current medical practices available, unavoidable complications occasionally occur. If you have any problems or questions after discharge, call DR. Kendall Arnell, [phone].  ACTIVITY  You may resume your regular activity, but move at a slower pace for the next 24 hours.   Take frequent rest periods for the next 24 hours.   Walking will help get rid of the air and reduce the bloated feeling in your belly (abdomen).   No driving for 24 hours (because of the medicine (anesthesia) used during the test).   You may shower.   Do not sign any important legal documents or operate any machinery for 24 hours (because of the anesthesia used during the test).    NUTRITION  Drink plenty of fluids.   You may resume your normal diet as instructed by your doctor.   Begin with a light meal and progress to your normal diet. Heavy or fried foods are harder to  digest and may make you feel sick to your stomach (nauseated).   Avoid alcoholic beverages for 24 hours or as instructed.    MEDICATIONS  You may resume your normal medications.   WHAT YOU CAN EXPECT TODAY  Some feelings of bloating in the abdomen.   Passage of more gas than usual.    IF YOU HAD A BIOPSY TAKEN DURING THE UPPER ENDOSCOPY:  Eat a soft diet IF YOU HAVE NAUSEA, BLOATING, ABDOMINAL PAIN, OR VOMITING.    FINDING OUT THE RESULTS OF YOUR TEST Not all test results are available during your visit. DR. Oneida Alar WILL CALL YOU WITHIN 14 DAYS OF YOUR PROCEDUE WITH YOUR RESULTS. Do not assume everything is normal if you have not heard from DR. Andriy Sherk, CALL HER OFFICE AT [phone].  SEEK IMMEDIATE MEDICAL ATTENTION AND CALL THE OFFICE: [phone] IF:  You have more than a spotting of blood in your stool.   Your belly is swollen (abdominal distention).   You are nauseated or vomiting.   You have a temperature over 101F.   You have abdominal pain or discomfort that is severe or gets worse throughout the day.  Gastritis  Gastritis is an inflammation (the body's way of reacting to injury and/or infection) of the stomach. It is often caused by viral or bacterial (germ) infections. It can also be caused BY ASPIRIN, BC/GOODY POWDER'S, (IBUPROFEN) MOTRIN, OR ALEVE (NAPROXEN), chemicals (including alcohol), SPICY FOODS, and medications.  This illness may be associated with generalized malaise (feeling tired, not well), UPPER ABDOMINAL STOMACH cramps, and fever. One common bacterial cause of gastritis is an organism known as H. Pylori. This can be treated with antibiotics.   ESOPHAGEAL STRICTURE  Esophageal strictures can be caused by stomach acid backing up into the tube that carries food from the mouth down to the stomach (lower esophagus).  TREATMENT There are a number of medicines used to treat reflux/stricture, including: Antacids.  Proton-pump  inhibitors:PROTONIX ZANTAC   Lifestyle and home remedies TO CONTROL REFLUX/HEARTBURN  You may eliminate or reduce the frequency of heartburn by making the following lifestyle changes:   Control your weight. Being overweight is a major risk factor for heartburn and GERD. Excess pounds put pressure on your abdomen, pushing up your stomach and causing acid to back up into your esophagus.    Eat smaller meals. 4 TO 6 MEALS A DAY. This reduces pressure on the lower esophageal sphincter, helping to prevent the valve from opening and acid from washing back into your esophagus.    Loosen your belt. Clothes that fit tightly around your waist put pressure on your abdomen and the lower esophageal sphincter.    Eliminate heartburn triggers. Everyone has specific triggers. Common triggers such as fatty or fried foods, spicy food, tomato sauce, carbonated beverages, alcohol, chocolate, mint, garlic, onion, caffeine and nicotine may make heartburn worse.    Avoid stooping or bending. Tying your shoes is OK. Bending over for longer periods to weed your garden isn't, especially soon after eating.    Don't lie down after a meal. Wait at least three to four hours after eating before going to bed, and don't lie down right after eating.    Alternative medicine  Several home remedies exist for treating GERD, but they provide only temporary relief. They include drinking baking soda (sodium bicarbonate) added to water or drinking other fluids such as baking soda mixed with cream of tartar and water.   Although these liquids create temporary relief by neutralizing, washing away or buffering acids, eventually they aggravate the situation by adding gas and fluid to your stomach, increasing pressure and causing more acid reflux. Further, adding more sodium to your diet may increase your blood pressure and add stress to your heart, and excessive bicarbonate ingestion can alter the acid-base balance in your  body.

## 2017-04-11 NOTE — Anesthesia Postprocedure Evaluation (Signed)
Anesthesia Post Note  Patient: JAELYNE DEEG  Procedure(s) Performed: Procedure(s) (LRB): ESOPHAGOGASTRODUODENOSCOPY (EGD) WITH PROPOFOL (N/A) SAVORY DILATION (N/A) BIOPSY  Patient location during evaluation: PACU Anesthesia Type: MAC Level of consciousness: awake and alert and oriented Pain management: pain level controlled Vital Signs Assessment: post-procedure vital signs reviewed and stable Respiratory status: spontaneous breathing Cardiovascular status: blood pressure returned to baseline Postop Assessment: no signs of nausea or vomiting Anesthetic complications: no     Last Vitals:  Vitals:   04/11/17 0815 04/11/17 0824  BP: 125/84 135/75  Pulse: 79 75  Resp: 14 16  Temp:  36.6 C  SpO2: 95% 97%    Last Pain:  Vitals:   04/11/17 0824  TempSrc: Oral                 Brailyn Killion

## 2017-04-11 NOTE — Anesthesia Preprocedure Evaluation (Signed)
Anesthesia Evaluation  Patient identified by MRN, date of birth, ID band Patient awake    Airway Mallampati: I  TM Distance: >3 FB Neck ROM: Full    Dental  (+) Edentulous Upper, Edentulous Lower   Pulmonary former smoker,    Pulmonary exam normal breath sounds clear to auscultation       Cardiovascular Exercise Tolerance: Good hypertension, Pt. on medications  Rhythm:Regular Rate:Normal  Patient denies any CP or SOB   Neuro/Psych    GI/Hepatic GERD  Medicated,  Endo/Other    Renal/GU      Musculoskeletal  (+) Arthritis , Osteoarthritis,    Abdominal (+)  Abdomen: soft.    Peds  Hematology   Anesthesia Other Findings   Reproductive/Obstetrics                             Anesthesia Physical Anesthesia Plan  ASA: III  Anesthesia Plan: MAC   Post-op Pain Management:    Induction: Intravenous  PONV Risk Score and Plan: Ondansetron and Midazolam  Airway Management Planned: Mask and Natural Airway  Additional Equipment:   Intra-op Plan:   Post-operative Plan:   Informed Consent: I have reviewed the patients History and Physical, chart, labs and discussed the procedure including the risks, benefits and alternatives for the proposed anesthesia with the patient or authorized representative who has indicated his/her understanding and acceptance.     Plan Discussed with:   Anesthesia Plan Comments:         Anesthesia Quick Evaluation

## 2017-04-11 NOTE — H&P (Signed)
Primary Care Physician:  Fayrene Helper, MD Primary Gastroenterologist:  Dr. Oneida Alar  Pre-Procedure History & Physical: HPI:  Sarah Coleman is a 66 y.o. female here for [facility].  Past Medical History:  Diagnosis Date  . Allergic rhinitis   . Allergy   . Anemia   . Arthritis   . GERD (gastroesophageal reflux disease) 2013  . Hyperlipidemia   . Hypertension   . Metabolic syndrome X 3790  . Morbid obesity ([place]) 2000  . Obesity   . Prediabetes 2010    Past Surgical History:  Procedure Laterality Date  . COLONOSCOPY N/A 10/07/2016   Procedure: COLONOSCOPY;  Surgeon: Danie Binder, MD;  Location: [facility];  Service: Endoscopy;  Laterality: N/A;  9:30 AM  . COLONOSCOPY WITH PROPOFOL N/A 11/01/2016   Procedure: COLONOSCOPY WITH PROPOFOL;  Surgeon: Danie Binder, MD;  Location: [facility];  Service: Endoscopy;  Laterality: N/A;  830   . GERD    . PARTIAL HYSTERECTOMY  1987   secondary to ovarian cyst   . POLYPECTOMY  11/01/2016   Procedure: POLYPECTOMY;  Surgeon: Danie Binder, MD;  Location: [facility];  Service: Endoscopy;;  colon  . TUBAL LIGATION  1975    Prior to Admission medications   Medication Sig Start Date End Date Taking? Authorizing Provider  acetaminophen (TYLENOL) 325 MG tablet Take 325-650 mg by mouth every 3 (three) hours as needed for mild pain.    Yes [provider]  aspirin EC 81 MG tablet Take 81 mg by mouth daily.   Yes [provider]  azelastine (ASTELIN) 0.1 % nasal spray Place 1 spray into both nostrils 2 (two) times daily. Use in each nostril as directed Patient taking differently: Place 2 sprays into both nostrils 2 (two) times daily. Use in each nostril as directed 02/09/16  Yes Fayrene Helper, MD  CALCIUM-MAGNESIUM-ZINC PO Take 1 tablet by mouth 2 (two) times daily.    Yes [provider]  carboxymethylcellulose (REFRESH PLUS) 0.5 % SOLN Place 1-2 drops into both eyes 3 (three) times daily as  needed (for dry eyes).   Yes [provider]  chlorthalidone (HYGROTON) 25 MG tablet Take 1 tablet (25 mg total) by mouth daily. 01/16/17 04/16/17 Yes BranchAlphonse Guild, MD  cholecalciferol (VITAMIN D) 1000 units tablet Take 1,000 Units by mouth daily.   Yes [provider]  cyclobenzaprine (FLEXERIL) 10 MG tablet TAKE ONE TABLET BY MOUTH AT BEDTIME Patient taking differently: TAKE ONE TABLET BY MOUTH AT BEDTIME AS NEEDED FOR MUSCLE SPASMS 12/12/16  Yes Fayrene Helper, MD  estradiol (ESTRACE) 0.1 MG/GM vaginal cream Place 1 Applicatorful vaginally every other day as needed (for vaginal discomfort). 12/28/16  Yes Fayrene Helper, MD  fluticasone [facility]) 50 MCG/ACT nasal spray USE TWO SPRAYS IN EACH NOSTRIL ONCE DAILY 04/05/17  Yes Fayrene Helper, MD  Iron-Vitamins (GERITOL PO) Take 1 tablet by mouth daily.   Yes [provider]  loratadine (CLARITIN) 10 MG tablet Take 10 mg by mouth daily.   Yes [provider]  lovastatin (MEVACOR) 20 MG tablet TAKE ONE TABLET BY MOUTH AT BEDTIME 11/28/16  Yes Fayrene Helper, MD  meclizine (ANTIVERT) 12.5 MG tablet TAKE ONE TABLET BY MOUTH THREE TIMES DAILY AS NEEDED FOR DIZZINESS Patient taking differently: TAKE ONE TABLET BY MOUTH THREE TIMES DAILY AS NEEDED FOR DIZZINESS/VERTIGO 06/24/16  Yes Fayrene Helper, MD  Omega-3 300 MG CAPS Take 1,200 mg by mouth daily.  Yes [provider]  pantoprazole (PROTONIX) 20 MG tablet Take 1 tablet (20 mg total) by mouth 2 (two) times daily before a meal. 02/16/17  Yes Fayrene Helper, MD  Phenylephrine-DM-GG-APAP (SUDAFED PE PRESSURE+PAIN+COLD) 5-10-100-325 MG TABS Take 2 tablets by mouth every 4 (four) hours as needed (COLD AND SINUS PRESSURE).   Yes [provider]  potassium chloride SA (K-DUR,KLOR-CON) 20 MEQ tablet Take 2 tablets (40 mEq total) by mouth 2 (two) times daily. 03/13/17  Yes BranchAlphonse Guild, MD  ranitidine (ZANTAC) 300 MG tablet Take  1 tablet (300 mg total) by mouth at bedtime. 02/16/17  Yes Fayrene Helper, MD  spironolactone (ALDACTONE) 25 MG tablet Take 0.5 tablets (12.5 mg total) by mouth daily. 01/16/17  Yes Branch, Alphonse Guild, MD  EQ ALLERGY RELIEF 10 MG tablet TAKE ONE TABLET BY MOUTH ONCE DAILY Patient taking differently: TAKE ONE TABLET BY MOUTH ONCE DAILY IN THE MORNING 10/24/16   Fayrene Helper, MD  nitroGLYCERIN (NITROSTAT) 0.4 MG SL tablet Place 1 tablet (0.4 mg total) under the tongue every 5 (five) minutes as needed for chest pain (hold for SBP < 110). 02/09/17   Janece Canterbury, MD    Allergies as of 03/23/2017 - Review Complete 03/23/2017  Allergen Reaction Noted  . Cinnamon Swelling 10/24/2016  . Ibuprofen Swelling 12/06/2007  . Penicillins Nausea And Vomiting 12/06/2007  . Sweet potato Swelling 10/24/2016    Family History  Problem Relation Age of Onset  . Heart attack Mother   . Heart disease Mother 62       massive heart attack  . Prostate cancer Father   . Cancer Father 28       colon  . Hypertension Sister   . Ovarian cancer Sister   . Cancer Brother 66       oral   . Hypertension Brother   . Heart disease Brother     Social History   Social History  . Marital status: Married    Spouse name: N/A  . Number of children: 5  . Years of education: N/A   Occupational History  . retired     Social History Main Topics  . Smoking status: Former Smoker    Packs/day: 1.00    Years: 3.00    Quit date: 08/15/1974  . Smokeless tobacco: Never Used  . Alcohol use No  . Drug use: No  . Sexual activity: Yes    Birth control/ protection: Surgical   Other Topics Concern  . Not on file   Social History Narrative  . No narrative on file    Review of Systems: See HPI, otherwise negative ROS   Physical Exam: BP 134/90   Pulse 84   Temp 98.4 F (36.9 C) (Oral)   Resp 12   Ht 5\' 6"  (1.676 m)   Wt 219 lb (99.3 kg)   SpO2 98%   BMI 35.35 kg/m  General:   Alert,  pleasant and  cooperative in NAD Head:  Normocephalic and atraumatic. Neck:  Supple; Lungs:  Clear throughout to auscultation.    Heart:  Regular rate and rhythm. Abdomen:  Soft, nontender and nondistended. Normal bowel sounds, without guarding, and without rebound.   Neurologic:  Alert and  oriented x4;  grossly normal neurologically.  Impression/Plan:     DYSPHAGIA/abd pain PLAN:  EGD/DIL TODAY. DISCUSSED PROCEDURE, BENEFITS, & RISKS: < 1% chance of medication reaction, bleeding, or perforation.

## 2017-04-12 ENCOUNTER — Telehealth: Payer: Self-pay | Admitting: Gastroenterology

## 2017-04-12 NOTE — Telephone Encounter (Signed)
Please call pt. HER stomach Bx shows gastritis DUE TO ASA USE.   DRINK WATER TO KEEP YOUR URINE LIGHT YELLOW.  CONTINUE YOUR WEIGHT LOSS EFFORTS.    FOLLOW A HIGH FIBER/LOW FAT DIET. AVOID FRIED FOODS. MEATS SHOULD BE BAKED, BROILED, OR BOILED.  AVOID REFLUX TRIGGERS. W.  FOLLOW UP IN 4 MOS E30 DYSPHAGIA/GASTRITIS.Marland Kitchen

## 2017-04-13 ENCOUNTER — Encounter (HOSPITAL_COMMUNITY): Payer: Self-pay | Admitting: Gastroenterology

## 2017-04-13 NOTE — Telephone Encounter (Signed)
LMOM to call back

## 2017-04-13 NOTE — Telephone Encounter (Signed)
OV made °

## 2017-04-13 NOTE — Telephone Encounter (Signed)
Pt is aware of results. 

## 2017-04-15 ENCOUNTER — Other Ambulatory Visit: Payer: Self-pay | Admitting: Family Medicine

## 2017-05-01 ENCOUNTER — Other Ambulatory Visit: Payer: Self-pay | Admitting: Family Medicine

## 2017-05-01 DIAGNOSIS — J302 Other seasonal allergic rhinitis: Secondary | ICD-10-CM

## 2017-05-09 ENCOUNTER — Ambulatory Visit (INDEPENDENT_AMBULATORY_CARE_PROVIDER_SITE_OTHER): Payer: PPO

## 2017-05-09 DIAGNOSIS — Z23 Encounter for immunization: Secondary | ICD-10-CM

## 2017-05-22 DIAGNOSIS — E785 Hyperlipidemia, unspecified: Secondary | ICD-10-CM | POA: Diagnosis not present

## 2017-05-22 DIAGNOSIS — I10 Essential (primary) hypertension: Secondary | ICD-10-CM | POA: Diagnosis not present

## 2017-05-22 DIAGNOSIS — R7303 Prediabetes: Secondary | ICD-10-CM | POA: Diagnosis not present

## 2017-05-23 ENCOUNTER — Encounter: Payer: Self-pay | Admitting: Family Medicine

## 2017-05-23 ENCOUNTER — Ambulatory Visit (INDEPENDENT_AMBULATORY_CARE_PROVIDER_SITE_OTHER): Payer: PPO | Admitting: Family Medicine

## 2017-05-23 VITALS — BP 120/76 | HR 106 | Temp 97.9°F | Resp 16 | Ht 66.0 in | Wt 220.2 lb

## 2017-05-23 DIAGNOSIS — Z0000 Encounter for general adult medical examination without abnormal findings: Secondary | ICD-10-CM

## 2017-05-23 DIAGNOSIS — I10 Essential (primary) hypertension: Secondary | ICD-10-CM

## 2017-05-23 DIAGNOSIS — Z1231 Encounter for screening mammogram for malignant neoplasm of breast: Secondary | ICD-10-CM

## 2017-05-23 DIAGNOSIS — E785 Hyperlipidemia, unspecified: Secondary | ICD-10-CM

## 2017-05-23 DIAGNOSIS — R7303 Prediabetes: Secondary | ICD-10-CM

## 2017-05-23 LAB — COMPLETE METABOLIC PANEL WITH GFR
AG Ratio: 1.7 (calc) (ref 1.0–2.5)
ALT: 15 U/L (ref 6–29)
AST: 18 U/L (ref 10–35)
Albumin: 4.5 g/dL (ref 3.6–5.1)
Alkaline phosphatase (APISO): 72 U/L (ref 33–130)
BUN/Creatinine Ratio: 9 (calc) (ref 6–22)
BUN: 9 mg/dL (ref 7–25)
CALCIUM: 9.7 mg/dL (ref 8.6–10.4)
CO2: 30 mmol/L (ref 20–32)
CREATININE: 1 mg/dL — AB (ref 0.50–0.99)
Chloride: 101 mmol/L (ref 98–110)
GFR, EST NON AFRICAN AMERICAN: 58 mL/min/{1.73_m2} — AB (ref >=60)
GFR, Est African American: 68 mL/min/{1.73_m2} (ref >=60)
GLOBULIN: 2.7 g/dL (ref 1.9–3.7)
Glucose, Bld: 103 mg/dL — ABNORMAL HIGH (ref 65–99)
Potassium: 3.9 mmol/L (ref 3.5–5.3)
SODIUM: 139 mmol/L (ref 135–146)
Total Bilirubin: 0.5 mg/dL (ref 0.2–1.2)
Total Protein: 7.2 g/dL (ref 6.1–8.1)

## 2017-05-23 LAB — LIPID PANEL
CHOL/HDL RATIO: 4.4 (calc) (ref <5.0)
Cholesterol: 170 mg/dL (ref <200)
HDL: 39 mg/dL — AB (ref >50)
LDL Cholesterol (Calc): 92 mg/dL (calc)
NON-HDL CHOLESTEROL (CALC): 131 mg/dL — AB (ref <130)
TRIGLYCERIDES: 293 mg/dL — AB (ref <150)

## 2017-05-23 LAB — HEMOGLOBIN A1C
HEMOGLOBIN A1C: 6 %{Hb} — AB (ref <5.7)
Mean Plasma Glucose: 126 (calc)
eAG (mmol/L): 7 (calc)

## 2017-05-23 LAB — TSH: TSH: 0.72 mIU/L (ref 0.40–4.50)

## 2017-05-23 NOTE — Patient Instructions (Addendum)
F/u in 4.5 to 5  month, call if you need me sooner  Mammogram to be scheduled at checkout for Dec 18 or after Wellness with nurse in January  It is important that you exercise regularly at least 30 minutes 5 times a week. If you develop chest pain, have severe difficulty breathing, or feel very tired, stop exercising immediately and seek medical attention    Please cut back on cookies and sweets,  And commit to more regular exercise, you need to work on weight loss more consistently   Fasting lipid, cmp and eGFR and hBA1C 1 week before next visit  Thank you  for choosing [facility]. We consider it a privelige to serve you.  Delivering excellent health care in a caring and  compassionate way is our goal.  Partnering with you,  so that together we can achieve this goal is our strategy.

## 2017-05-27 ENCOUNTER — Encounter: Payer: Self-pay | Admitting: Family Medicine

## 2017-05-27 NOTE — Progress Notes (Signed)
    Sarah Coleman     MRN: [id number]      DOB: October 24, 1950  HPI: Patient is in for annual physical exam. No other health concerns are expressed or addressed at the visit. Recent labs, if available are reviewed. Immunization is reviewed , and  updated if needed.   PE: BP 120/76 (BP Location: Left Arm, Patient Position: Sitting, Cuff Size: Normal)   Pulse (!) 106   Temp 97.9 F (36.6 C) (Other (Comment))   Resp 16   Ht 5\' 6"  (1.676 m)   Wt 220 lb 4 oz (99.9 kg)   SpO2 97%   BMI 35.55 kg/m   Pleasant  female, alert and oriented x 3, in no cardio-pulmonary distress. Afebrile. HEENT No facial trauma or asymetry. Sinuses non tender.  Extra occullar muscles intact, pupils equally reactive to light. External ears normal, tympanic membranes clear. Oropharynx moist, no exudate. Neck: supple, no adenopathy,JVD or thyromegaly.No bruits.  Chest: Clear to ascultation bilaterally.No crackles or wheezes. Non tender to palpation  Breast: No asymetry,no masses or lumps. No tenderness. No nipple discharge or inversion. No axillary or supraclavicular adenopathy  Cardiovascular system; Heart sounds normal,  S1 and  S2 ,no S3.  No murmur, or thrill. Apical beat not displaced Peripheral pulses normal.  Abdomen: Soft, non tender, no organomegaly or masses. No bruits. Bowel sounds normal. No guarding, tenderness or rebound.  Rectal:  Not indicated due to recent colonoscopy.  GU: Not indicated , asymptomatic  Musculoskeletal exam: Full ROM of spine, hips , shoulders and knees. No deformity ,swelling or crepitus noted. No muscle wasting or atrophy.   Neurologic: Cranial nerves 2 to 12 intact. Power, tone ,sensation and reflexes normal throughout. No disturbance in gait. No tremor.  Skin: Intact, no ulceration, erythema , scaling or rash noted. Pigmentation normal throughout  Psych; Normal mood and affect. Judgement and concentration normal   Assessment & Plan:  Annual  physical exam Annual exam as documented. Counseling done  re healthy lifestyle involving commitment to 150 minutes exercise per week, heart healthy diet, and attaining healthy weight.The importance of adequate sleep also discussed. . Immunization and cancer screening needs are specifically addressed at this visit.

## 2017-05-27 NOTE — Assessment & Plan Note (Signed)
Annual exam as documented. Counseling done  re healthy lifestyle involving commitment to 150 minutes exercise per week, heart healthy diet, and attaining healthy weight.The importance of adequate sleep also discussed.  Immunization and cancer screening needs are specifically addressed at this visit.  

## 2017-06-05 ENCOUNTER — Telehealth: Payer: Self-pay | Admitting: Family Medicine

## 2017-06-05 ENCOUNTER — Other Ambulatory Visit: Payer: Self-pay | Admitting: Family Medicine

## 2017-06-05 NOTE — Telephone Encounter (Signed)
Patient calling to request appt with dr simpson today.  She has a complaint of bladder pressure, urge to go but little urine output, cloudy urine,  And dull lower back pain.  She states this has been going on for about a week.  She experienced this about 2 years ago and Dr. Moshe Cipro wrote something for her that cleared it up.  Patient states she started the AZO otc 5 days ago and has gotten some relief.  Please advise if she can come for a nurse visit for urine test or if she can be worked in with Dr. Meda Coffee.

## 2017-06-05 NOTE — Telephone Encounter (Signed)
Tried to call patient twice, busy signal both times

## 2017-06-05 NOTE — Telephone Encounter (Signed)
AZO will affect the ability to test urine.  She needs to stop for 24 h and bring in spec tomorrow.  Push fluids

## 2017-06-05 NOTE — Telephone Encounter (Signed)
Ave standing orders for Dr. Moshe Cipro that state that if a patient has nurse visit for UA and has positive nitrites and leukocytes, I can give Cipro 500 mg 1 tab PO BID #6, as long as the patient does not have fever, chills or flank pain. But I also need to send for uc. Please advise if patient can come in today. I ask because of the lower back pain, and because the UC results will need to be read, as well as if you will advise if patient only has positive on one of two items.

## 2017-06-06 NOTE — Telephone Encounter (Signed)
Patient was informed of message by D.Christa See

## 2017-06-06 NOTE — Telephone Encounter (Signed)
Seen [phone] 

## 2017-06-07 ENCOUNTER — Ambulatory Visit (INDEPENDENT_AMBULATORY_CARE_PROVIDER_SITE_OTHER): Payer: PPO

## 2017-06-07 ENCOUNTER — Telehealth: Payer: Self-pay

## 2017-06-07 DIAGNOSIS — R300 Dysuria: Secondary | ICD-10-CM | POA: Diagnosis not present

## 2017-06-07 LAB — POCT URINALYSIS DIPSTICK
BILIRUBIN UA: NEGATIVE
GLUCOSE UA: NEGATIVE
Ketones, UA: NEGATIVE
LEUKOCYTES UA: NEGATIVE
NITRITE UA: NEGATIVE
Protein, UA: NEGATIVE
Spec Grav, UA: 1.02 (ref 1.010–1.025)
Urobilinogen, UA: 0.2 E.U./dL
pH, UA: 6.5 (ref 5.0–8.0)

## 2017-06-07 NOTE — Telephone Encounter (Signed)
Pt came in office to have urine checked, poct ua done.See results; she is c/o lower abd discomfort as well as lower back pain.

## 2017-06-08 NOTE — Telephone Encounter (Signed)
UA dip showed trace blood.  No treatment.  Repeat if symptoms worsen or persist

## 2017-06-12 ENCOUNTER — Other Ambulatory Visit: Payer: Self-pay | Admitting: Family Medicine

## 2017-06-21 ENCOUNTER — Other Ambulatory Visit: Payer: Self-pay | Admitting: Family Medicine

## 2017-06-21 NOTE — Telephone Encounter (Signed)
Seen [phone] 

## 2017-06-22 ENCOUNTER — Telehealth: Payer: Self-pay | Admitting: Family Medicine

## 2017-06-22 ENCOUNTER — Ambulatory Visit: Payer: PPO | Admitting: Family Medicine

## 2017-06-22 ENCOUNTER — Other Ambulatory Visit: Payer: Self-pay

## 2017-06-22 ENCOUNTER — Encounter: Payer: Self-pay | Admitting: Family Medicine

## 2017-06-22 VITALS — BP 124/84 | HR 93 | Resp 16 | Ht 66.0 in | Wt 221.0 lb

## 2017-06-22 DIAGNOSIS — E785 Hyperlipidemia, unspecified: Secondary | ICD-10-CM

## 2017-06-22 DIAGNOSIS — J302 Other seasonal allergic rhinitis: Secondary | ICD-10-CM

## 2017-06-22 DIAGNOSIS — I10 Essential (primary) hypertension: Secondary | ICD-10-CM

## 2017-06-22 DIAGNOSIS — J309 Allergic rhinitis, unspecified: Secondary | ICD-10-CM

## 2017-06-22 DIAGNOSIS — N760 Acute vaginitis: Secondary | ICD-10-CM

## 2017-06-22 DIAGNOSIS — Z113 Encounter for screening for infections with a predominantly sexual mode of transmission: Secondary | ICD-10-CM

## 2017-06-22 DIAGNOSIS — M549 Dorsalgia, unspecified: Secondary | ICD-10-CM

## 2017-06-22 DIAGNOSIS — Z7689 Persons encountering health services in other specified circumstances: Secondary | ICD-10-CM | POA: Diagnosis not present

## 2017-06-22 MED ORDER — METRONIDAZOLE 500 MG PO TABS: 500.0000 mg | ORAL_TABLET | Freq: Two times a day (BID) | ORAL | 0 refills | Status: DC

## 2017-06-22 MED ORDER — LOVASTATIN 20 MG PO TABS: 20.0000 mg | ORAL_TABLET | Freq: Every day | ORAL | 1 refills | Status: DC

## 2017-06-22 MED ORDER — PREDNISONE 5 MG PO TABS: 5.0000 mg | ORAL_TABLET | Freq: Two times a day (BID) | ORAL | 0 refills | Status: DC

## 2017-06-22 NOTE — Patient Instructions (Signed)
F/u in February as before, call if you need me sooner  You are treated for sinus infection  Urine is being sent for STD testing and also you need labs today for HSV2 and HIV testing per your request  .Please work on good  health habits so that your health will improve. 1. Commitment to daily physical activity for 30 to 60  minutes, if you are able to do this.  2. Commitment to wise food choices. Aim for half of your  food intake to be vegetable and fruit, one quarter starchy foods, and one quarter protein. Try to eat on a regular schedule  3 meals per day, snacking between meals should be limited to vegetables or fruits or small portions of nuts. 64 ounces of water per day is generally recommended, unless you have specific health conditions, like heart failure or kidney failure where you will need to limit fluid intake.  3. Commitment to sufficient and a  good quality of physical and mental rest daily, generally between 6 to 8 hours per day.  WITH PERSISTANCE AND PERSEVERANCE, THE IMPOSSIBLE , BECOMES THE NORM!  Thank you  for choosing [facility]. We consider it a privelige to serve you.  Delivering excellent health care in a caring and  compassionate way is our goal.  Partnering with you,  so that together we can achieve this goal is our strategy.

## 2017-06-22 NOTE — Telephone Encounter (Signed)
Vella Raring, left message on nurse line regarding patient. She states they received prescription for prednisone 5 mg, #10 with instructions to take one tablet BID for 4 days. She asks that you clarify prescription- is patient to take for 5 days, or only get #8?  Callback# [phone]

## 2017-06-22 NOTE — Telephone Encounter (Signed)
Updated rx sent

## 2017-06-22 NOTE — Progress Notes (Signed)
   Sarah Coleman     MRN: [id number]      DOB: [birth year]   HPI Sarah Coleman is here with a 3 day h/o frontal pressure and increased sinus drainage C/o back pian associated with intercourse C/o concern re possible STD exposure and wants to be tested for all, states her spouse continues to be unfaithful, and she has learned to tune this out    ROS Denies recent fever or chills. Denies sinus pressure, nasal congestion, ear pain or sore throat. Denies chest congestion, productive cough or wheezing. Denies chest pains, palpitations and leg swelling Denies abdominal pain, nausea, vomiting,diarrhea or constipation.   Denies dysuria, frequency, hesitancy or incontinence. C/o back pain Denies headaches, seizures, numbness, or tingling. Denies depression, anxiety or insomnia. Denies skin break down or rash.   PE  BP 124/84   Pulse 93   Resp 16   Ht 5\' 6"  (1.676 m)   Wt 221 lb (100.2 kg)   SpO2 99%   BMI 35.67 kg/m   Patient alert and oriented and in no cardiopulmonary distress.  HEENT: No facial asymmetry, EOMI,   oropharynx pink and moist.  Neck supple no JVD, no mass.  Chest: Clear to auscultation bilaterally.  CVS: S1, S2 no murmurs, no S3.Regular rate.  ABD: Soft non tender.   Ext: No edema  MS: Decreased  ROM spine, adequate in shoulders, hips and knees.  Skin: Intact, no ulcerations or rash noted.  Psych: Good eye contact, normal affect. Memory intact not anxious or depressed appearing.  CNS: CN 2-12 intact, power,  normal throughout.no focal deficits noted.   Assessment & Plan  Encounter for assessment of STD exposure Primary concern per patient is possible exposure to STD due to spouse's infidelity Specimens will be tested through urine and blood samples Advised condom use if she is so concerned, has had prior attempts at confronting spouse , he repeatedly denies.  Essential hypertension Controlled, no change in medication DASH diet and commitment to daily  physical activity for a minimum of 30 minutes discussed and encouraged, as a part of hypertension management. The importance of attaining a healthy weight is also discussed.  BP/Weight 06/22/2017 05/23/2017 04/11/2017 03/23/2017 03/09/2017 02/16/2017 6/64/4034  Systolic BP 742 595 638 756 433 295 188  Diastolic BP 84 76 75 92 84 82 67  Wt. (Lbs) 221 220.25 219 219 220 218.12 -  BMI 35.67 35.55 35.35 35.35 35.51 35.21 -       Back pain with radiation Increased pain wih intercourse, tylenol, and muscle relaxant   Hyperlipidemia LDL goal <100 Hyperlipidemia:Low fat diet discussed and encouraged.   Lipid Panel  Lab Results  Component Value Date   CHOL 170 05/22/2017   HDL 39 (L) 05/22/2017   LDLCALC 80 02/09/2017   LDLDIRECT 69 02/13/2009   TRIG 293 (H) 05/22/2017   CHOLHDL 4.4 05/22/2017   Elevated TG, needs to reduce fried and fatty foods    Seasonal allergies Increased and uncontrolled symptoms, needs to take medication daily

## 2017-06-23 ENCOUNTER — Inpatient Hospital Stay (HOSPITAL_COMMUNITY): Admit: 2017-06-23 | Payer: Self-pay

## 2017-06-23 LAB — HIV ANTIBODY (ROUTINE TESTING W REFLEX): HIV 1&2 Ab, 4th Generation: NONREACTIVE

## 2017-06-23 LAB — HSV 2 ANTIBODY, IGG: HSV 2 Glycoprotein G Ab, IgG: <0.9 index

## 2017-06-27 ENCOUNTER — Other Ambulatory Visit: Payer: Self-pay | Admitting: Cardiology

## 2017-06-27 MED ORDER — POTASSIUM CHLORIDE CRYS ER 20 MEQ PO TBCR: 40.0000 meq | EXTENDED_RELEASE_TABLET | Freq: Two times a day (BID) | ORAL | 6 refills | Status: DC

## 2017-06-27 NOTE — Telephone Encounter (Signed)
° °  1. Which medications need to be refilled? (please list name of each medication and dose if known)  potassium chloride SA (K-DUR,KLOR-CON) 20    2. Which pharmacy/location (including street and city if local pharmacy) is medication to be sent to? [facility], [place]     3. Do they need a 30 day or 90 day supply?

## 2017-06-27 NOTE — Telephone Encounter (Signed)
Done

## 2017-06-28 ENCOUNTER — Other Ambulatory Visit (HOSPITAL_COMMUNITY)
Admission: RE | Admit: 2017-06-28 | Discharge: 2017-06-28 | Disposition: A | Discharge status: Home / Self Care | Payer: PPO | Source: Ambulatory Visit | Attending: Family Medicine | Admitting: Family Medicine

## 2017-06-28 ENCOUNTER — Ambulatory Visit: Payer: PPO

## 2017-06-28 DIAGNOSIS — N760 Acute vaginitis: Secondary | ICD-10-CM | POA: Diagnosis not present

## 2017-06-28 NOTE — Addendum Note (Signed)
Addended by: Eual Fines on: 06/28/2017 11:19 AM   Modules accepted: Orders

## 2017-06-29 LAB — URINE CYTOLOGY ANCILLARY ONLY
CHLAMYDIA, DNA PROBE: NEGATIVE
NEISSERIA GONORRHEA: NEGATIVE
TRICH (WINDOWPATH): NEGATIVE

## 2017-06-30 LAB — URINE CYTOLOGY ANCILLARY ONLY
Bacterial vaginitis: NEGATIVE
CANDIDA VAGINITIS: NEGATIVE

## 2017-07-04 ENCOUNTER — Telehealth: Payer: Self-pay

## 2017-07-04 MED ORDER — FLUCONAZOLE 150 MG PO TABS: 150.0000 mg | ORAL_TABLET | Freq: Once | ORAL | 0 refills | Status: AC

## 2017-07-04 NOTE — Telephone Encounter (Signed)
Ok per dr to send 1 diflucan to pharmacy for post antibiotic use

## 2017-07-09 ENCOUNTER — Encounter: Payer: Self-pay | Admitting: Family Medicine

## 2017-07-09 DIAGNOSIS — J309 Allergic rhinitis, unspecified: Secondary | ICD-10-CM | POA: Insufficient documentation

## 2017-07-09 NOTE — Assessment & Plan Note (Signed)
Primary concern per patient is possible exposure to STD due to spouse's infidelity Specimens will be tested through urine and blood samples Advised condom use if she is so concerned, has had prior attempts at confronting spouse , he repeatedly denies.

## 2017-07-09 NOTE — Assessment & Plan Note (Signed)
Increased pain wih intercourse, tylenol, and muscle relaxant

## 2017-07-09 NOTE — Assessment & Plan Note (Signed)
Controlled, no change in medication DASH diet and commitment to daily physical activity for a minimum of 30 minutes discussed and encouraged, as a part of hypertension management. The importance of attaining a healthy weight is also discussed.  BP/Weight 06/22/2017 05/23/2017 04/11/2017 03/23/2017 03/09/2017 02/16/2017 4/72/0721  Systolic BP 828 833 744 514 604 799 872  Diastolic BP 84 76 75 92 84 82 67  Wt. (Lbs) 221 220.25 219 219 220 218.12 -  BMI 35.67 35.55 35.35 35.35 35.51 35.21 -

## 2017-07-09 NOTE — Assessment & Plan Note (Signed)
Hyperlipidemia:Low fat diet discussed and encouraged.   Lipid Panel  Lab Results  Component Value Date   CHOL 170 05/22/2017   HDL 39 (L) 05/22/2017   LDLCALC 80 02/09/2017   LDLDIRECT 69 02/13/2009   TRIG 293 (H) 05/22/2017   CHOLHDL 4.4 05/22/2017   Elevated TG, needs to reduce fried and fatty foods

## 2017-07-09 NOTE — Assessment & Plan Note (Signed)
Increased and uncontrolled symptoms, needs to take medication daily

## 2017-07-11 ENCOUNTER — Other Ambulatory Visit: Payer: Self-pay | Admitting: Family Medicine

## 2017-07-17 ENCOUNTER — Telehealth: Payer: Self-pay | Admitting: *Deleted

## 2017-07-17 NOTE — Telephone Encounter (Signed)
Patient aware of her results 

## 2017-07-17 NOTE — Telephone Encounter (Signed)
Patient called requesting to talk to Velna Hatchet only, patient request a return call to [phone]

## 2017-07-18 ENCOUNTER — Encounter: Payer: Self-pay | Admitting: Family Medicine

## 2017-07-19 ENCOUNTER — Encounter: Payer: Self-pay | Admitting: Nurse Practitioner

## 2017-07-19 ENCOUNTER — Ambulatory Visit: Payer: PPO | Admitting: Nurse Practitioner

## 2017-07-19 VITALS — BP 146/93 | HR 83 | Temp 97.8°F | Ht 66.0 in | Wt 219.8 lb

## 2017-07-19 DIAGNOSIS — Z86010 Personal history of colonic polyps: Secondary | ICD-10-CM

## 2017-07-19 DIAGNOSIS — R1310 Dysphagia, unspecified: Secondary | ICD-10-CM

## 2017-07-19 DIAGNOSIS — K219 Gastro-esophageal reflux disease without esophagitis: Secondary | ICD-10-CM

## 2017-07-19 DIAGNOSIS — R1319 Other dysphagia: Secondary | ICD-10-CM

## 2017-07-19 NOTE — Assessment & Plan Note (Signed)
No dysphagia symptoms, essentially resolved at this time.  Remains on PPI.  Continue prescription medications, return for follow-up in April.

## 2017-07-19 NOTE — Progress Notes (Signed)
Referring Provider: Fayrene Helper, MD Primary Care Physician:  Fayrene Helper, MD Primary GI:  Dr. Oneida Alar  Chief Complaint  Patient presents with  . Gastroesophageal Reflux    pp f/u    HPI:   Sarah Coleman is a 66 y.o. female who presents for follow-up on GERD.  The patient was last seen in our office 03/23/2017 for GERD, abdominal pain, dysphagia.  Previous hospitalization in June 2018 for chest pain with no evidence of ACS by enzymes or EKG and follow-up testing indicated overall low cardiovascular risk.  Noted solid food dysphasia with occasional regurgitation, overall heartburn better on Protonix 20 mg a day and Zantac 300 mg at night.  Vomiting at least weekly.  No other GI symptoms.  Recommended upper endoscopy for further evaluation propofol/MAC.  EGD completed 04/11/2017 which found distal esophageal web status post dilation, gastritis status post biopsy.  Surgical pathology found the biopsies to be antral and body mucosa changes with slight hyperemia and mild reactive changes.  Recommended increase Protonix to 40 mg twice daily.  Deemed gastritis due to aspirin use.  Recommending avoid reflux triggers as well.  Today she states she's doing well overall. Dysphagia is resolved after dilation. GERD symptoms resolved, no breakthrough symptoms. She has completed her 3 months of bid PPI and now on daily Protonix. Denies abdominal pain, N/V, hematochezia, melena, fever, chills, unintentional weight loss. Denies chest pain, dyspnea, dizziness, lightheadedness, syncope, near syncope. Denies any other upper or lower GI symptoms.  Last colonoscopy was completed 3/218 and up to date. Due in 1-3 years.  Past Medical History:  Diagnosis Date  . Allergic rhinitis   . Allergy   . Anemia   . Arthritis   . GERD (gastroesophageal reflux disease) 2013  . Hyperlipidemia   . Hypertension   . Metabolic syndrome X 0350  . Morbid obesity ([place]) 2000  . Obesity   . Prediabetes 2010     Past Surgical History:  Procedure Laterality Date  . BIOPSY  04/11/2017   Procedure: BIOPSY;  Surgeon: Danie Binder, MD;  Location: [facility];  Service: Endoscopy;;  gastric bx's  . COLONOSCOPY N/A 10/07/2016   Procedure: COLONOSCOPY;  Surgeon: Danie Binder, MD;  Location: [facility];  Service: Endoscopy;  Laterality: N/A;  9:30 AM  . COLONOSCOPY WITH PROPOFOL N/A 11/01/2016   Procedure: COLONOSCOPY WITH PROPOFOL;  Surgeon: Danie Binder, MD;  Location: [facility];  Service: Endoscopy;  Laterality: N/A;  830   . ESOPHAGOGASTRODUODENOSCOPY (EGD) WITH PROPOFOL N/A 04/11/2017   Procedure: ESOPHAGOGASTRODUODENOSCOPY (EGD) WITH PROPOFOL;  Surgeon: Danie Binder, MD;  Location: [facility];  Service: Endoscopy;  Laterality: N/A;  10:45am  . GERD    . PARTIAL HYSTERECTOMY  1987   secondary to ovarian cyst   . POLYPECTOMY  11/01/2016   Procedure: POLYPECTOMY;  Surgeon: Danie Binder, MD;  Location: [facility];  Service: Endoscopy;;  colon  . SAVORY DILATION N/A 04/11/2017   Procedure: SAVORY DILATION;  Surgeon: Danie Binder, MD;  Location: [facility];  Service: Endoscopy;  Laterality: N/A;  . TUBAL LIGATION  1975    Current Outpatient Medications  Medication Sig Dispense Refill  . acetaminophen (TYLENOL) 325 MG tablet Take 325-650 mg by mouth every 3 (three) hours as needed for mild pain.     Marland Kitchen aspirin EC 81 MG tablet Take 81 mg by mouth daily.    Marland Kitchen azelastine (ASTELIN) 0.1 % nasal spray  USE ONE SPRAY(S) IN EACH NOSTRIL TWICE DAILY AS DIRECTED 30 mL 12  . CALCIUM-MAGNESIUM-ZINC PO Take 1 tablet by mouth 2 (two) times daily.     . carboxymethylcellulose (REFRESH PLUS) 0.5 % SOLN Place 1-2 drops into both eyes 3 (three) times daily as needed (for dry eyes).    . chlorthalidone (HYGROTON) 25 MG tablet Take 1 tablet (25 mg total) by mouth daily. 90 tablet 3  . cholecalciferol (VITAMIN D) 1000 units tablet Take 1,000 Units by mouth daily.    . cyclobenzaprine (FLEXERIL)  10 MG tablet TAKE 1 TABLET BY MOUTH AT BEDTIME 30 tablet 3  . EQ ALLERGY RELIEF 10 MG tablet TAKE 1 TABLET BY MOUTH ONCE DAILY 90 tablet 1  . estradiol (ESTRACE) 0.1 MG/GM vaginal cream Place 1 Applicatorful vaginally every other day as needed (for vaginal discomfort). 42.5 g 3  . fluticasone (FLONASE) 50 MCG/ACT nasal spray USE 2 SPRAY(S) IN EACH NOSTRIL ONCE DAILY 16 g 3  . Iron-Vitamins (GERITOL PO) Take 1 tablet by mouth daily.    Marland Kitchen loratadine (CLARITIN) 10 MG tablet Take 10 mg by mouth daily.    Marland Kitchen lovastatin (MEVACOR) 20 MG tablet Take 1 tablet (20 mg total) at bedtime by mouth. 90 tablet 1  . meclizine (ANTIVERT) 12.5 MG tablet TAKE ONE TABLET BY MOUTH THREE TIMES DAILY AS NEEDED FOR DIZZINESS 90 tablet 2  . nitroGLYCERIN (NITROSTAT) 0.4 MG SL tablet Place 1 tablet (0.4 mg total) under the tongue every 5 (five) minutes as needed for chest pain (hold for SBP < 110). 30 tablet 0  . Omega-3 300 MG CAPS Take 1,200 mg by mouth daily.    . pantoprazole (PROTONIX) 40 MG tablet 1 PO 30 MINUTES PRIOR TO MEALS BID FOR 3 MOS THEN QD (Patient taking differently: 40 mg daily. 1 PO 30 MINUTES PRIOR TO MEALS BID FOR 3 MOS THEN QD) 60 tablet 11  . potassium chloride SA (K-DUR,KLOR-CON) 20 MEQ tablet Take 2 tablets (40 mEq total) 2 (two) times daily by mouth. 120 tablet 6  . ranitidine (ZANTAC) 300 MG tablet Take 1 tablet (300 mg total) by mouth at bedtime. If needed to control reflux/heartburn 30 tablet 5  . spironolactone (ALDACTONE) 25 MG tablet Take 0.5 tablets (12.5 mg total) by mouth daily. 45 tablet 3  . predniSONE (DELTASONE) 5 MG tablet Take 1 tablet (5 mg total) 2 (two) times daily by mouth. (Patient not taking: Reported on 07/19/2017) 10 tablet 0   No current facility-administered medications for this visit.     Allergies as of 07/19/2017 - Review Complete 07/19/2017  Allergen Reaction Noted  . Cinnamon Swelling 10/24/2016  . Ibuprofen Swelling 12/06/2007  . Penicillins Nausea And Vomiting  12/06/2007  . Sweet potato Swelling 10/24/2016    Family History  Problem Relation Age of Onset  . Heart attack Mother   . Heart disease Mother 63       massive heart attack  . Prostate cancer Father   . Colon cancer Father 54  . Hypertension Sister   . Ovarian cancer Sister   . Cancer Brother 74       oral   . Hypertension Brother   . Heart disease Brother     Social History   Socioeconomic History  . Marital status: Married    Spouse name: None  . Number of children: 5  . Years of education: None  . Highest education level: None  Social Needs  . Financial resource strain: None  .  Food insecurity - worry: None  . Food insecurity - inability: None  . Transportation needs - medical: None  . Transportation needs - non-medical: None  Occupational History  . Occupation: retired   Tobacco Use  . Smoking status: Former Smoker    Packs/day: 1.00    Years: 3.00    Pack years: 3.00    Last attempt to quit: 08/15/1974    Years since quitting: 42.9  . Smokeless tobacco: Never Used  Substance and Sexual Activity  . Alcohol use: No    Alcohol/week: 0.0 oz  . Drug use: No  . Sexual activity: Yes    Birth control/protection: Surgical  Other Topics Concern  . None  Social History Narrative  . None    Review of Systems: Complete ROS negative except as per HPI.   Physical Exam: BP (!) 146/93   Pulse 83   Temp 97.8 F (36.6 C) (Oral)   Ht _0  (1.676 m)   Wt 219 lb 12.8 oz (99.7 kg)   BMI 35.48 kg/m  General:   Alert and oriented. Pleasant and cooperative. Well-nourished and well-developed.  Eyes:  Without icterus, sclera clear and conjunctiva pink.  Ears:  Normal auditory acuity. Cardiovascular:  S1, S2 present without murmurs appreciated. Extremities without clubbing or edema. Respiratory:  Clear to auscultation bilaterally. No wheezes, rales, or rhonchi. No distress.  Gastrointestinal:  +BS, soft, non-tender and non-distended. No HSM noted. No guarding or  rebound. No masses appreciated.  Rectal:  Deferred  Musculoskalatal:  Symmetrical without gross deformities. Neurologic:  Alert and oriented x4;  grossly normal neurologically. Psych:  Alert and cooperative. Normal mood and affect. Heme/Lymph/Immune: No excessive bruising noted.    07/19/2017 1:43 PM   Disclaimer: This note was dictated with voice recognition software. Similar sounding words can inadvertently be transcribed and may not be corrected upon review.

## 2017-07-19 NOTE — Progress Notes (Signed)
CC'ED TO PCP 

## 2017-07-19 NOTE — Assessment & Plan Note (Signed)
Symptoms essentially resolved at this time.  She completed 3 months of twice a day dosing of Protonix and is now back on once a day dosing.  No breakthrough symptoms.  Continue to monitor, return for follow-up in April

## 2017-07-19 NOTE — Patient Instructions (Signed)
1. Continue taking Protonix once a day. 2. We will have you return in April 2019 to schedule your surveillance colonoscopy because of multiple polyps on your last colonoscopy and your family history of colon cancer. 3. Call if you have any questions or concerns.

## 2017-07-19 NOTE — Assessment & Plan Note (Signed)
The patient has a family history of colon cancer in a primary relative in her father diagnosed at age 67.  Her last colonoscopy was just under a year ago when she had 10 polyps removed which were tubular adenoma.  Recommended 1-3-year repeat.  She is on recall for 1 year and will be due in April 2019.  No significant GI symptoms or red flag/warning signs or symptoms.  We will have her return for follow-up in April 2019 for surveillance colonoscopy given personal history of colon polyps and family history of colon cancer.

## 2017-07-24 ENCOUNTER — Ambulatory Visit: Payer: PPO | Admitting: Gastroenterology

## 2017-08-03 ENCOUNTER — Encounter (HOSPITAL_COMMUNITY): Payer: Self-pay

## 2017-08-03 ENCOUNTER — Ambulatory Visit (HOSPITAL_COMMUNITY)
Admission: RE | Admit: 2017-08-03 | Discharge: 2017-08-03 | Disposition: A | Discharge status: Home / Self Care | Payer: PPO | Source: Ambulatory Visit | Attending: Family Medicine | Admitting: Family Medicine

## 2017-08-03 DIAGNOSIS — Z1231 Encounter for screening mammogram for malignant neoplasm of breast: Secondary | ICD-10-CM | POA: Insufficient documentation

## 2017-08-03 DIAGNOSIS — R7303 Prediabetes: Secondary | ICD-10-CM

## 2017-08-03 DIAGNOSIS — I10 Essential (primary) hypertension: Secondary | ICD-10-CM

## 2017-08-03 DIAGNOSIS — E785 Hyperlipidemia, unspecified: Secondary | ICD-10-CM

## 2017-08-18 ENCOUNTER — Encounter: Payer: Self-pay | Admitting: Family Medicine

## 2017-08-18 ENCOUNTER — Other Ambulatory Visit: Payer: Self-pay

## 2017-08-18 ENCOUNTER — Ambulatory Visit (INDEPENDENT_AMBULATORY_CARE_PROVIDER_SITE_OTHER): Payer: PPO | Admitting: Family Medicine

## 2017-08-18 VITALS — BP 132/84 | HR 84 | Temp 97.2°F | Resp 18 | Ht 66.0 in | Wt 222.1 lb

## 2017-08-18 DIAGNOSIS — B9789 Other viral agents as the cause of diseases classified elsewhere: Secondary | ICD-10-CM

## 2017-08-18 DIAGNOSIS — J069 Acute upper respiratory infection, unspecified: Secondary | ICD-10-CM | POA: Diagnosis not present

## 2017-08-18 MED ORDER — BENZONATATE 100 MG PO CAPS: 100.0000 mg | ORAL_CAPSULE | Freq: Two times a day (BID) | ORAL | 0 refills | Status: DC | PRN

## 2017-08-18 MED ORDER — SULFAMETHOXAZOLE-TRIMETHOPRIM 800-160 MG PO TABS: 1.0000 | ORAL_TABLET | Freq: Two times a day (BID) | ORAL | 0 refills | Status: DC

## 2017-08-18 NOTE — Progress Notes (Signed)
Chief Complaint  Patient presents with  . Sinus Problem    x 1 week   Patient had cough cold runny nose and sinus symptoms for about a week.  She states that she is been managing at home with Flonase and pushing fluids.  She states today she coughed up "dark stuff" and thought she should be seen.  No sweats or chills.  No fever.  No underlying COPD or asthma.  She does have allergies.  No chest pain.  No wheezing.  No fatigue.  Patient Active Problem List   Diagnosis Date Noted  . History of adenomatous polyp of colon 07/19/2017  . Allergic rhinitis 07/09/2017  . Abdominal pain, epigastric 03/23/2017  . Esophageal dysphagia 03/23/2017  . Back pain with radiation 01/17/2017  . Polyp of colon   . Family hx of colon cancer   . Encounter for assessment of STD exposure 02/09/2016  . Metabolic syndrome X 22/09/5425  . Prediabetes 04/05/2013  . GERD (gastroesophageal reflux disease) 11/01/2012  . Hyperlipidemia LDL goal <100 06/09/2008  . Episodic recurrent vertigo 12/07/2007  . Morbid obesity ([place]) 12/06/2007  . Essential hypertension 12/06/2007  . Seasonal allergies 12/06/2007    Outpatient Encounter Medications as of 08/18/2017  Medication Sig  . acetaminophen (TYLENOL) 325 MG tablet Take 325-650 mg by mouth every 3 (three) hours as needed for mild pain.   Marland Kitchen aspirin EC 81 MG tablet Take 81 mg by mouth daily.  Marland Kitchen azelastine (ASTELIN) 0.1 % nasal spray USE ONE SPRAY(S) IN EACH NOSTRIL TWICE DAILY AS DIRECTED  . CALCIUM-MAGNESIUM-ZINC PO Take 1 tablet by mouth 2 (two) times daily.   . carboxymethylcellulose (REFRESH PLUS) 0.5 % SOLN Place 1-2 drops into both eyes 3 (three) times daily as needed (for dry eyes).  . chlorthalidone (HYGROTON) 25 MG tablet Take 1 tablet (25 mg total) by mouth daily.  . cholecalciferol (VITAMIN D) 1000 units tablet Take 1,000 Units by mouth daily.  . cyclobenzaprine (FLEXERIL) 10 MG tablet TAKE 1 TABLET BY MOUTH AT BEDTIME  . EQ ALLERGY RELIEF 10 MG tablet  TAKE 1 TABLET BY MOUTH ONCE DAILY  . estradiol (ESTRACE) 0.1 MG/GM vaginal cream Place 1 Applicatorful vaginally every other day as needed (for vaginal discomfort).  . fluticasone (FLONASE) 50 MCG/ACT nasal spray USE 2 SPRAY(S) IN EACH NOSTRIL ONCE DAILY  . Iron-Vitamins (GERITOL PO) Take 1 tablet by mouth daily.  Marland Kitchen loratadine (CLARITIN) 10 MG tablet Take 10 mg by mouth daily.  Marland Kitchen lovastatin (MEVACOR) 20 MG tablet Take 1 tablet (20 mg total) at bedtime by mouth.  . meclizine (ANTIVERT) 12.5 MG tablet TAKE ONE TABLET BY MOUTH THREE TIMES DAILY AS NEEDED FOR DIZZINESS  . nitroGLYCERIN (NITROSTAT) 0.4 MG SL tablet Place 1 tablet (0.4 mg total) under the tongue every 5 (five) minutes as needed for chest pain (hold for SBP < 110).  . Omega-3 300 MG CAPS Take 1,200 mg by mouth daily.  . pantoprazole (PROTONIX) 40 MG tablet 1 PO 30 MINUTES PRIOR TO MEALS BID FOR 3 MOS THEN QD (Patient taking differently: 40 mg daily. 1 PO 30 MINUTES PRIOR TO MEALS BID FOR 3 MOS THEN QD)  . potassium chloride SA (K-DUR,KLOR-CON) 20 MEQ tablet Take 2 tablets (40 mEq total) 2 (two) times daily by mouth.  . predniSONE (DELTASONE) 5 MG tablet Take 1 tablet (5 mg total) 2 (two) times daily by mouth.  . ranitidine (ZANTAC) 300 MG tablet Take 1 tablet (300 mg total) by mouth at bedtime.  If needed to control reflux/heartburn  . spironolactone (ALDACTONE) 25 MG tablet Take 0.5 tablets (12.5 mg total) by mouth daily.  . benzonatate (TESSALON) 100 MG capsule Take 1 capsule (100 mg total) by mouth 2 (two) times daily as needed for cough.  . sulfamethoxazole-trimethoprim (BACTRIM DS,SEPTRA DS) 800-160 MG tablet Take 1 tablet by mouth 2 (two) times daily. For infection   No facility-administered encounter medications on file as of 08/18/2017.     Allergies  Allergen Reactions  . Cinnamon Swelling  . Ibuprofen Swelling  . Penicillins Nausea And Vomiting    Has patient had a PCN reaction causing immediate rash, facial/tongue/throat  swelling, SOB or lightheadedness with hypotension:No Has patient had a PCN reaction causing severe rash involving mucus membranes or skin necrosis:No Has patient had a PCN reaction that required hospitalization:No Has patient had a PCN reaction occurring within the last 10 years:No If all of the above answers are "NO", then may proceed with Cephalosporin use.   Marland Kitchen Sweet Potato Swelling    Review of Systems  Constitutional: Negative for activity change, appetite change, chills, fatigue and fever.  HENT: Positive for congestion, postnasal drip, rhinorrhea, sinus pressure, sinus pain and sore throat.   Eyes: Negative for redness and visual disturbance.  Respiratory: Positive for cough. Negative for shortness of breath.   Cardiovascular: Negative for chest pain and palpitations.  Gastrointestinal: Negative for constipation and diarrhea.  Genitourinary: Negative for difficulty urinating and frequency.  Musculoskeletal: Negative for arthralgias and myalgias.  Neurological: Positive for headaches. Negative for dizziness.  Psychiatric/Behavioral: Negative for sleep disturbance.    BP 132/84 (BP Location: Left Arm, Patient Position: Sitting, Cuff Size: Normal)   Pulse 84   Temp (!) 97.2 F (36.2 C) (Temporal)   Resp 18   Ht 5\' 6"  (1.676 m)   Wt 222 lb 1.3 oz (100.7 kg)   SpO2 98%   BMI 35.84 kg/m   Physical Exam  Constitutional: She is oriented to person, place, and time. She appears well-developed and well-nourished.  HENT:  Head: Normocephalic and atraumatic.  Right Ear: External ear normal.  Left Ear: External ear normal.  Post pharynx red, nasal membranes swollen and red  Eyes: Conjunctivae are normal. Pupils are equal, round, and reactive to light.  Neck: Normal range of motion.  Cardiovascular: Normal rate, regular rhythm and normal heart sounds.  Pulmonary/Chest: Effort normal and breath sounds normal.  Abdominal: Soft. Bowel sounds are normal. There is no tenderness.    Musculoskeletal: Normal range of motion. She exhibits no edema.  Lymphadenopathy:    She has no cervical adenopathy.  Neurological: She is alert and oriented to person, place, and time.  Psychiatric: She has a normal mood and affect. Her behavior is normal.    ASSESSMENT/PLAN:  Viral upper respiratory infection.  Discussed need to avoid antibiotics.  Patient Instructions  Push fluids Take mucinex DM if needed for congestion Continue using your flonase and sinus rinses Tessalon for cough  Call if not better in a few days  Fill and use antibiotic ONLY if symptoms worsen over time   Raylene Everts, MD

## 2017-08-18 NOTE — Patient Instructions (Signed)
Push fluids Take mucinex DM if needed for congestion Continue using your flonase and sinus rinses Tessalon for cough  Call if not better in a few days  Fill and use antibiotic ONLY if symptoms worsen over time

## 2017-09-01 ENCOUNTER — Telehealth: Payer: Self-pay | Admitting: Family Medicine

## 2017-09-01 ENCOUNTER — Other Ambulatory Visit: Payer: Self-pay

## 2017-09-01 MED ORDER — FLUCONAZOLE 150 MG PO TABS: 150.0000 mg | ORAL_TABLET | Freq: Once | ORAL | 0 refills | Status: AC

## 2017-09-01 NOTE — Telephone Encounter (Signed)
1 pill sent.

## 2017-09-01 NOTE — Telephone Encounter (Signed)
Pt is calling, stating she has a yeast infection wants something called in, she does not need a call back,

## 2017-09-04 ENCOUNTER — Telehealth: Payer: Self-pay | Admitting: Family Medicine

## 2017-09-04 ENCOUNTER — Other Ambulatory Visit: Payer: Self-pay

## 2017-09-04 MED ORDER — FLUCONAZOLE 150 MG PO TABS: 150.0000 mg | ORAL_TABLET | Freq: Once | ORAL | 0 refills | Status: AC

## 2017-09-04 MED ORDER — FLUCONAZOLE 150 MG PO TABS: 150.0000 mg | ORAL_TABLET | Freq: Once | ORAL | 0 refills | Status: DC

## 2017-09-04 NOTE — Telephone Encounter (Signed)
Sarah Coleman, [name], has questions about a medication that was e-scribed today.  [phone]

## 2017-09-04 NOTE — Telephone Encounter (Signed)
Correction to rx made

## 2017-10-11 ENCOUNTER — Ambulatory Visit: Payer: PPO | Admitting: Family Medicine

## 2017-10-18 ENCOUNTER — Telehealth: Payer: Self-pay | Admitting: Family Medicine

## 2017-10-18 NOTE — Telephone Encounter (Signed)
Allergies are flared up , swelling, feeling sick and week, can you call something in ..[phone]

## 2017-10-18 NOTE — Telephone Encounter (Signed)
Called and left message to call back with more info

## 2017-10-18 NOTE — Telephone Encounter (Signed)
Is she wanting something for allergies or swelling?

## 2017-10-19 NOTE — Telephone Encounter (Signed)
Pt called back --[facility] are causing the Allergies-she is allergic to them. It is causing her face to swell, and feel little nausea. PT advised she took 2 benadrlys and that helped and she went to bed.

## 2017-10-19 NOTE — Telephone Encounter (Signed)
Advised zyrtec and to continue her flonase

## 2017-10-20 ENCOUNTER — Other Ambulatory Visit: Payer: Self-pay | Admitting: Family Medicine

## 2017-10-20 ENCOUNTER — Telehealth: Payer: Self-pay | Admitting: Family Medicine

## 2017-10-20 MED ORDER — PREDNISONE 5 MG PO TABS: 5.0000 mg | ORAL_TABLET | Freq: Two times a day (BID) | ORAL | 0 refills | Status: AC

## 2017-10-20 NOTE — Telephone Encounter (Signed)
5 day course of prednisone is sent in.Please let her know She has no appt Missed her Feb appt, she needs a wellness visit this is overdue, pls schedule one swish her for March with nurse/ soonest available with nurse, and also please schedule her and MD appt 3 months after her wellnes with me, tell her she has not been in to see me since last October and  Missed her feb f/u and we have things we are working on together!!!

## 2017-10-20 NOTE — Telephone Encounter (Signed)
Scheduled 10/25/17

## 2017-10-20 NOTE — Progress Notes (Signed)
pew

## 2017-10-20 NOTE — Telephone Encounter (Signed)
Patient called in because she is having swelling in her mouth and face, she says its allergies and she was advised to take zyrtec. She said zyrtec and lorapadine have not worked for her, she says normally Dr.Simpson will give her prednisone because this does work for her. Cb#: [phone] [facility]: [facility] in [place]

## 2017-10-23 DIAGNOSIS — Z1231 Encounter for screening mammogram for malignant neoplasm of breast: Secondary | ICD-10-CM | POA: Diagnosis not present

## 2017-10-23 DIAGNOSIS — I10 Essential (primary) hypertension: Secondary | ICD-10-CM | POA: Diagnosis not present

## 2017-10-23 DIAGNOSIS — E785 Hyperlipidemia, unspecified: Secondary | ICD-10-CM | POA: Diagnosis not present

## 2017-10-23 DIAGNOSIS — R7303 Prediabetes: Secondary | ICD-10-CM | POA: Diagnosis not present

## 2017-10-24 LAB — COMPLETE METABOLIC PANEL WITH GFR
AG RATIO: 1.7 (calc) (ref 1.0–2.5)
ALT: 17 U/L (ref 6–29)
AST: 16 U/L (ref 10–35)
Albumin: 4.5 g/dL (ref 3.6–5.1)
Alkaline phosphatase (APISO): 57 U/L (ref 33–130)
BILIRUBIN TOTAL: 0.6 mg/dL (ref 0.2–1.2)
BUN: 16 mg/dL (ref 7–25)
CALCIUM: 9.6 mg/dL (ref 8.6–10.4)
CHLORIDE: 99 mmol/L (ref 98–110)
CO2: 29 mmol/L (ref 20–32)
Creat: 0.97 mg/dL (ref 0.50–0.99)
GFR, EST NON AFRICAN AMERICAN: 60 mL/min/{1.73_m2} (ref >=60)
GFR, Est African American: 70 mL/min/{1.73_m2} (ref >=60)
Globulin: 2.7 g/dL (calc) (ref 1.9–3.7)
Glucose, Bld: 93 mg/dL (ref 65–99)
POTASSIUM: 3.3 mmol/L — AB (ref 3.5–5.3)
Sodium: 137 mmol/L (ref 135–146)
Total Protein: 7.2 g/dL (ref 6.1–8.1)

## 2017-10-24 LAB — LIPID PANEL
CHOL/HDL RATIO: 4.4 (calc) (ref <5.0)
Cholesterol: 186 mg/dL (ref <200)
HDL: 42 mg/dL — AB (ref >50)
LDL Cholesterol (Calc): 104 mg/dL (calc) — ABNORMAL HIGH
NON-HDL CHOLESTEROL (CALC): 144 mg/dL — AB (ref <130)
Triglycerides: 300 mg/dL — ABNORMAL HIGH (ref <150)

## 2017-10-24 LAB — HEMOGLOBIN A1C
Hgb A1c MFr Bld: 6.2 % of total Hgb — ABNORMAL HIGH (ref <5.7)
MEAN PLASMA GLUCOSE: 131 (calc)
eAG (mmol/L): 7.3 (calc)

## 2017-10-25 ENCOUNTER — Encounter: Payer: Self-pay | Admitting: Family Medicine

## 2017-10-25 ENCOUNTER — Ambulatory Visit (INDEPENDENT_AMBULATORY_CARE_PROVIDER_SITE_OTHER): Payer: PPO | Admitting: Family Medicine

## 2017-10-25 VITALS — BP 138/82 | HR 83 | Resp 16 | Ht 66.0 in | Wt 220.0 lb

## 2017-10-25 DIAGNOSIS — I10 Essential (primary) hypertension: Secondary | ICD-10-CM

## 2017-10-25 DIAGNOSIS — R7303 Prediabetes: Secondary | ICD-10-CM | POA: Diagnosis not present

## 2017-10-25 DIAGNOSIS — E785 Hyperlipidemia, unspecified: Secondary | ICD-10-CM | POA: Diagnosis not present

## 2017-10-25 DIAGNOSIS — J302 Other seasonal allergic rhinitis: Secondary | ICD-10-CM | POA: Diagnosis not present

## 2017-10-25 DIAGNOSIS — E8881 Metabolic syndrome: Secondary | ICD-10-CM

## 2017-10-25 MED ORDER — POTASSIUM CHLORIDE CRYS ER 20 MEQ PO TBCR: EXTENDED_RELEASE_TABLET | ORAL | 5 refills | Status: DC

## 2017-10-25 NOTE — Patient Instructions (Addendum)
Wellness with nurse is past due, please schedule as soon as possible  Fasting lipid, chem 7 and EGFr, hBA1C , CBC 1 week before next visit  Potassium is increased to 5 daily  Use flonase, and Astelin every day and OK to flush with saline drops/ Netty pot    Physical exam with MD end August or early September  Please change food choice  As we discussed, and  Start more regular exercise  Cholesterol and blood sugar have both increased which increases your risk of heart disease and stroke  It is important that you exercise regularly at least 30 minutes 5 times a week. If you develop chest pain, have severe difficulty breathing, or feel very tired, stop exercising immediately and seek medical attention  Thank you  for choosing [facility]. We consider it a privelige to serve you.  Delivering excellent health care in a caring and  compassionate way is our goal.  Partnering with you,  so that together we can achieve this goal is our strategy.

## 2017-10-29 NOTE — Progress Notes (Signed)
Sarah Coleman     MRN: [id number]      DOB: 12/06/1950   HPI [name] is here for follow up and re-evaluation of chronic medical conditions, medication management and review of any available recent lab and radiology data.  Preventive health is updated, specifically  Cancer screening and Immunization.   Questions or concerns regarding consultations or procedures which the PT has had in the interim are  addressed. The PT denies any adverse reactions to current medications since the last visit.  C/o increased and uncontrolled allergy symptoms of nasal and sui nus pressure and drainage and cough ROS Denies recent fever or chills. Denies ear pain or sore throat. Denies wheezing. Denies chest pains, palpitations and leg swelling Denies abdominal pain, nausea, vomiting,diarrhea or constipation.   Denies dysuria, frequency, hesitancy or incontinence. Denies joint pain, swelling and limitation in mobility. Denies headaches, seizures, numbness, or tingling. Denies depression, anxiety or insomnia. Denies skin break down or rash.   PE  BP 138/82   Pulse 83   Resp 16   Ht 5\' 6"  (1.676 m)   Wt 220 lb (99.8 kg)   SpO2 97%   BMI 35.51 kg/m   Patient alert and oriented and in no cardiopulmonary distress.  HEENT: No facial asymmetry, EOMI,   oropharynx pink and moist.  Neck supple no JVD, no mass.  Chest: Clear to auscultation bilaterally.  CVS: S1, S2 no murmurs, no S3.Regular rate.  ABD: Soft non tender.   Ext: No edema  MS: Adequate ROM spine, shoulders, hips and knees.  Skin: Intact, no ulcerations or rash noted.  Psych: Good eye contact, normal affect. Memory intact not anxious or depressed appearing.  CNS: CN 2-12 intact, power,  normal throughout.no focal deficits noted.   Assessment & Plan  Essential hypertension Controlled, no change in medication DASH diet and commitment to daily physical activity for a minimum of 30 minutes discussed and encouraged, as a part of  hypertension management. The importance of attaining a healthy weight is also discussed.  BP/Weight 10/25/2017 08/18/2017 07/19/2017 06/22/2017 05/23/2017 11/03/252 09/21/621  Systolic BP 762 831 517 616 073 710 626  Diastolic BP 82 84 93 84 76 75 92  Wt. (Lbs) 220 222.08 219.8 221 220.25 219 219  BMI 35.51 35.84 35.48 35.67 35.55 35.35 35.35       Hyperlipidemia LDL goal <100 Hyperlipidemia:Low fat diet discussed and encouraged.   Lipid Panel  Lab Results  Component Value Date   CHOL 186 10/23/2017   HDL 42 (L) 10/23/2017   LDLCALC 104 (H) 10/23/2017   LDLDIRECT 69 02/13/2009   TRIG 300 (H) 10/23/2017   CHOLHDL 4.4 10/23/2017    Uncontrolled, dietary modification needed to reduce CVD also needs to commit to regular exercise   Morbid obesity ([facility]) Unchanged. Patient re-educated about  the importance of commitment to a  minimum of 150 minutes of exercise per week.  The importance of healthy food choices with portion control discussed. Encouraged to start a food diary, count calories and to consider  joining a support group. Sample diet sheets offered. Goals set by the patient for the next several months.   Weight /BMI 10/25/2017 08/18/2017 07/19/2017  WEIGHT 220 lb 222 lb 1.3 oz 219 lb 12.8 oz  HEIGHT 5\' 6"  5\' 6"  5\' 6"   BMI 35.51 kg/m2 35.84 kg/m2 35.48 kg/m2      Prediabetes Patient educated about the importance of limiting  Carbohydrate intake , the need to commit to daily physical activity  for a minimum of 30 minutes , and to commit weight loss. The fact that changes in all these areas will reduce or eliminate all together the development of diabetes is stressed.  Deteriorated  Diabetic Labs Latest Ref Rng & Units 10/23/2017 05/22/2017 02/28/2017 02/09/2017 02/08/2017  HbA1c <5.7 % of total Hgb 6.2(H) 6.0(H) - 5.8(H) -  Chol <200 mg/dL 186 170 - 178 -  HDL >50 mg/dL 42(L) 39(L) - 36(L) -  Calc LDL mg/dL (calc) 104(H) 92 - 80 -  Triglycerides <150 mg/dL 300(H) 293(H) -  308(H) -  Creatinine 0.50 - 0.99 mg/dL 0.97 1.00(H) 0.98 - 0.91   BP/Weight 10/25/2017 08/18/2017 07/19/2017 06/22/2017 05/23/2017 0/98/1191 11/19/8293  Systolic BP 621 308 657 846 962 952 841  Diastolic BP 82 84 93 84 76 75 92  Wt. (Lbs) 220 222.08 219.8 221 220.25 219 219  BMI 35.51 35.84 35.48 35.67 35.55 35.35 35.35   No flowsheet data found.    Seasonal allergies Uncontrolled Ad tess lon perle short term, continue daily use of ar least 2 agents and saline flush  Metabolic syndrome X The increased risk of cardiovascular disease associated with this diagnosis, and the need to consistently work on lifestyle to change this is discussed. Following  a  heart healthy diet ,commitment to 30 minutes of exercise at least 5 days per week, as well as control of blood sugar and cholesterol , and achieving a healthy weight are all the areas to be addressed .

## 2017-10-29 NOTE — Assessment & Plan Note (Signed)
Patient educated about the importance of limiting  Carbohydrate intake , the need to commit to daily physical activity for a minimum of 30 minutes , and to commit weight loss. The fact that changes in all these areas will reduce or eliminate all together the development of diabetes is stressed.  Deteriorated  Diabetic Labs Latest Ref Rng & Units 10/23/2017 05/22/2017 02/28/2017 02/09/2017 02/08/2017  HbA1c <5.7 % of total Hgb 6.2(H) 6.0(H) - 5.8(H) -  Chol <200 mg/dL 186 170 - 178 -  HDL >50 mg/dL 42(L) 39(L) - 36(L) -  Calc LDL mg/dL (calc) 104(H) 92 - 80 -  Triglycerides <150 mg/dL 300(H) 293(H) - 308(H) -  Creatinine 0.50 - 0.99 mg/dL 0.97 1.00(H) 0.98 - 0.91   BP/Weight 10/25/2017 08/18/2017 07/19/2017 06/22/2017 05/23/2017 0/08/7492 11/21/6757  Systolic BP 163 846 659 935 701 779 390  Diastolic BP 82 84 93 84 76 75 92  Wt. (Lbs) 220 222.08 219.8 221 220.25 219 219  BMI 35.51 35.84 35.48 35.67 35.55 35.35 35.35   No flowsheet data found.

## 2017-10-29 NOTE — Assessment & Plan Note (Signed)
The increased risk of cardiovascular disease associated with this diagnosis, and the need to consistently work on lifestyle to change this is discussed. Following  a  heart healthy diet ,commitment to 30 minutes of exercise at least 5 days per week, as well as control of blood sugar and cholesterol , and achieving a healthy weight are all the areas to be addressed .  

## 2017-10-29 NOTE — Assessment & Plan Note (Signed)
Controlled, no change in medication DASH diet and commitment to daily physical activity for a minimum of 30 minutes discussed and encouraged, as a part of hypertension management. The importance of attaining a healthy weight is also discussed.  BP/Weight 10/25/2017 08/18/2017 07/19/2017 06/22/2017 05/23/2017 9/45/0388 03/16/8002  Systolic BP 491 791 505 697 948 016 553  Diastolic BP 82 84 93 84 76 75 92  Wt. (Lbs) 220 222.08 219.8 221 220.25 219 219  BMI 35.51 35.84 35.48 35.67 35.55 35.35 35.35

## 2017-10-29 NOTE — Assessment & Plan Note (Addendum)
Hyperlipidemia:Low fat diet discussed and encouraged.   Lipid Panel  Lab Results  Component Value Date   CHOL 186 10/23/2017   HDL 42 (L) 10/23/2017   LDLCALC 104 (H) 10/23/2017   LDLDIRECT 69 02/13/2009   TRIG 300 (H) 10/23/2017   CHOLHDL 4.4 10/23/2017    Uncontrolled, dietary modification needed to reduce CVD also needs to commit to regular exercise

## 2017-10-29 NOTE — Assessment & Plan Note (Signed)
Unchanged. Patient re-educated about  the importance of commitment to a  minimum of 150 minutes of exercise per week.  The importance of healthy food choices with portion control discussed. Encouraged to start a food diary, count calories and to consider  joining a support group. Sample diet sheets offered. Goals set by the patient for the next several months.   Weight /BMI 10/25/2017 08/18/2017 07/19/2017  WEIGHT 220 lb 222 lb 1.3 oz 219 lb 12.8 oz  HEIGHT 5\' 6"  5\' 6"  5\' 6"   BMI 35.51 kg/m2 35.84 kg/m2 35.48 kg/m2

## 2017-10-29 NOTE — Assessment & Plan Note (Addendum)
Uncontrolled Ad tess lon perle short term, continue daily use of ar least 2 agents and saline flush

## 2017-10-30 ENCOUNTER — Telehealth: Payer: Self-pay | Admitting: Family Medicine

## 2017-10-30 NOTE — Telephone Encounter (Signed)
[facility] in [place]  losartin is recalled, patient brought in letter, I gave information to Sarah Coleman.  Patient said she does not need this information back. Cb#: [phone]

## 2017-10-30 NOTE — Telephone Encounter (Signed)
Entry error. The medication was regarding this patients husband

## 2017-10-31 ENCOUNTER — Telehealth: Payer: Self-pay

## 2017-10-31 NOTE — Telephone Encounter (Signed)
Calling again for update on recalled med

## 2017-11-01 NOTE — Telephone Encounter (Signed)
Patient aware.

## 2017-11-06 ENCOUNTER — Other Ambulatory Visit: Payer: Self-pay | Admitting: Family Medicine

## 2017-11-06 DIAGNOSIS — J302 Other seasonal allergic rhinitis: Secondary | ICD-10-CM

## 2017-11-10 ENCOUNTER — Other Ambulatory Visit: Payer: Self-pay | Admitting: Family Medicine

## 2017-11-15 ENCOUNTER — Other Ambulatory Visit: Payer: Self-pay | Admitting: Gastroenterology

## 2017-11-27 ENCOUNTER — Ambulatory Visit (INDEPENDENT_AMBULATORY_CARE_PROVIDER_SITE_OTHER): Payer: PPO

## 2017-11-27 VITALS — BP 130/80 | HR 64 | Temp 98.2°F | Resp 16 | Ht 66.0 in | Wt 221.0 lb

## 2017-11-27 DIAGNOSIS — Z0000 Encounter for general adult medical examination without abnormal findings: Secondary | ICD-10-CM

## 2017-11-27 NOTE — Progress Notes (Signed)
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  Review of Systems:   Cardiac Risk Factors include: advanced age (>8men, >71 women);obesity (BMI >30kg/m2);sedentary lifestyle;hypertension     Objective:     Vitals: BP 130/80 (BP Location: Left Arm, Patient Position: Sitting, Cuff Size: Normal)   Pulse 64   Temp 98.2 F (36.8 C) (Temporal)   Resp 16   Ht 5\' 6"  (1.676 m)   Wt 221 lb (100.2 kg)   SpO2 97%   BMI 35.67 kg/m   Body mass index is 35.67 kg/m.  Advanced Directives 11/27/2017 04/11/2017 02/09/2017 02/08/2017 02/08/2017 11/01/2016 10/26/2016  Does Patient Have a Medical Advance Directive? Yes Yes Yes No No No No  Type of Advance Directive - Healthcare Power of [place];Living will [place];Living will - - - -  Does patient want to make changes to medical advance directive? No - Patient declined - - - - - -  Copy of Press photographer in Chart? - No - copy requested Yes - - - -  Would patient like information on creating a medical advance directive? - - (No Data) Yes (Inpatient - patient requests chaplain consult to create a medical advance directive) - No - Patient declined No - Patient declined    Tobacco Social History   Tobacco Use  Smoking Status Former Smoker  . Packs/day: 1.00  . Years: 3.00  . Pack years: 3.00  . Last attempt to quit: 08/15/1974  . Years since quitting: 43.3  Smokeless Tobacco Never Used     Counseling given: Yes   Clinical Intake:  Pre-visit preparation completed: Yes  Pain : No/denies pain Pain Score: 0-No pain     Nutritional Status: BMI > 30  Obese Diabetes: No  How often do you need to have someone help you when you read instructions, pamphlets, or other written materials from your doctor or pharmacy?: 1 - Never  Interpreter Needed?: No     Past Medical History:  Diagnosis Date  . Allergic rhinitis   . Allergy   .  Anemia   . Arthritis   . GERD (gastroesophageal reflux disease) 2013  . Hyperlipidemia   . Hypertension   . Metabolic syndrome X 9924  . Morbid obesity ([place]) 2000  . Obesity   . Prediabetes 2010   Past Surgical History:  Procedure Laterality Date  . BIOPSY  04/11/2017   Procedure: BIOPSY;  Surgeon: Danie Binder, MD;  [facility];  Service: Endoscopy;;  gastric bx's  . COLONOSCOPY N/A 10/07/2016   Procedure: COLONOSCOPY;  Surgeon: Danie Binder, MD;  [facility];  Service: Endoscopy;  Laterality: N/A;  9:30 AM  . COLONOSCOPY WITH PROPOFOL N/A 11/01/2016   Procedure: COLONOSCOPY WITH PROPOFOL;  Surgeon: Danie Binder, MD;  [facility];  Service: Endoscopy;  Laterality: N/A;  830   . ESOPHAGOGASTRODUODENOSCOPY (EGD) WITH PROPOFOL N/A 04/11/2017   Procedure: ESOPHAGOGASTRODUODENOSCOPY (EGD) WITH PROPOFOL;  Surgeon: Danie Binder, MD;  [facility];  Service: Endoscopy;  Laterality: N/A;  10:45am  . GERD    . PARTIAL HYSTERECTOMY  1987   secondary to ovarian cyst   . POLYPECTOMY  11/01/2016   Procedure: POLYPECTOMY;  Surgeon: Danie Binder, MD;  [facility];  Service: Endoscopy;;  colon  . SAVORY DILATION N/A 04/11/2017   Procedure: SAVORY DILATION;  Surgeon: Danie Binder, MD;  [facility];  Service: Endoscopy;  Laterality: N/A;  . TUBAL LIGATION  1975   Family History  Problem Relation Age of Onset  . Heart attack Mother   . Heart disease Mother 22       massive heart attack  . Prostate cancer Father   . Colon cancer Father 91  . Hypertension Sister   . Ovarian cancer Sister   . Cancer Brother 68       oral   . Hypertension Brother   . Heart disease Brother    Social History   Socioeconomic History  . Marital status: Married    Spouse name: Not on file  . Number of children: 5  . Years of education: Not on file  . Highest education level: Not on file  Occupational History  . Occupation:  retired   Scientific laboratory technician  . Financial resource strain: Not very hard  . Food insecurity:    Worry: Never true    Inability: Never true  . Transportation needs:    Medical: No    Non-medical: No  Tobacco Use  . Smoking status: Former Smoker    Packs/day: 1.00    Years: 3.00    Pack years: 3.00    Last attempt to quit: 08/15/1974    Years since quitting: 43.3  . Smokeless tobacco: Never Used  Substance and Sexual Activity  . Alcohol use: No    Alcohol/week: 0.0 oz  . Drug use: No  . Sexual activity: Yes    Birth control/protection: Surgical  Lifestyle  . Physical activity:    Days per week: 0 days    Minutes per session: 0 min  . Stress: Not at all  Relationships  . Social connections:    Talks on phone: More than three times a week    Gets together: Never    Attends religious service: More than 4 times per year    Active member of club or organization: No    Attends meetings of clubs or organizations: Never    Relationship status: Married  Other Topics Concern  . Not on file  Social History Narrative  . Not on file    Outpatient Encounter Medications as of 11/27/2017  Medication Sig  . acetaminophen (TYLENOL) 325 MG tablet Take 325-650 mg by mouth every 3 (three) hours as needed for mild pain.   Marland Kitchen aspirin EC 81 MG tablet Take 81 mg by mouth daily.  Marland Kitchen azelastine (ASTELIN) 0.1 % nasal spray USE ONE SPRAY(S) IN EACH NOSTRIL TWICE DAILY AS DIRECTED  . benzonatate (TESSALON) 100 MG capsule Take 1 capsule (100 mg total) by mouth 2 (two) times daily as needed for cough.  Marland Kitchen CALCIUM-MAGNESIUM-ZINC PO Take 1 tablet by mouth 2 (two) times daily.   . carboxymethylcellulose (REFRESH PLUS) 0.5 % SOLN Place 1-2 drops into both eyes 3 (three) times daily as needed (for dry eyes).  . chlorthalidone (HYGROTON) 25 MG tablet Take 1 tablet (25 mg total) by mouth daily.  . cholecalciferol (VITAMIN D) 1000 units tablet Take 1,000 Units by mouth daily.  . cyclobenzaprine (FLEXERIL) 10 MG tablet  TAKE 1 TABLET BY MOUTH AT BEDTIME  . EQ ALLERGY RELIEF 10 MG tablet TAKE 1 TABLET BY MOUTH ONCE DAILY  . estradiol (ESTRACE) 0.1 MG/GM vaginal cream Place 1 Applicatorful vaginally every other day as needed (for vaginal discomfort).  . fluticasone (FLONASE) 50 MCG/ACT nasal spray USE 2 SPRAY(S) IN EACH NOSTRIL ONCE DAILY  . Iron-Vitamins (GERITOL PO) Take  1 tablet by mouth daily.  Marland Kitchen loratadine (CLARITIN) 10 MG tablet Take 10 mg by mouth daily.  Marland Kitchen lovastatin (MEVACOR) 20 MG tablet Take 1 tablet (20 mg total) at bedtime by mouth.  . meclizine (ANTIVERT) 12.5 MG tablet TAKE ONE TABLET BY MOUTH THREE TIMES DAILY AS NEEDED FOR DIZZINESS  . nitroGLYCERIN (NITROSTAT) 0.4 MG SL tablet Place 1 tablet (0.4 mg total) under the tongue every 5 (five) minutes as needed for chest pain (hold for SBP < 110).  . Omega-3 300 MG CAPS Take 1,200 mg by mouth daily.  . pantoprazole (PROTONIX) 40 MG tablet 1 PO 30 MINUTES PRIOR TO MEALS BID FOR 3 MOS THEN QD (Patient taking differently: 40 mg daily. 1 PO 30 MINUTES PRIOR TO MEALS BID FOR 3 MOS THEN QD)  . potassium chloride SA (K-DUR,KLOR-CON) 20 MEQ tablet Two tablets in the morning , one tablet at lunch, and two tablets in the evening  . ranitidine (ZANTAC) 300 MG tablet TAKE 1 TABLET BY MOUTH AT BEDTIME. IF NEEDED TO  CONTROL REFLUX/HEARTBURN.  Marland Kitchen spironolactone (ALDACTONE) 25 MG tablet Take 0.5 tablets (12.5 mg total) by mouth daily.   No facility-administered encounter medications on file as of 11/27/2017.     Activities of Daily Living In your present state of health, do you have any difficulty performing the following activities: 11/27/2017 08/18/2017  Hearing? N N  Vision? N N  Difficulty concentrating or making decisions? Y N  Walking or climbing stairs? N N  Dressing or bathing? N N  Doing errands, shopping? N N  Preparing Food and eating ? N -  Using the Toilet? N -  In the past six months, have you accidently leaked urine? N -  Do you have problems with  loss of bowel control? N -  Managing your Medications? N -  Managing your Finances? N -  Housekeeping or managing your Housekeeping? N -  Some recent data might be hidden    Patient Care Team: Fayrene Helper, MD as PCP - General Franchot Gallo, MD as Consulting Physician (Urology) Harl Bowie Alphonse Guild, MD as Consulting Physician (Cardiology)    Assessment:   This is a routine wellness examination for [place].  Exercise Activities and Dietary recommendations Current Exercise Habits: The patient does not participate in regular exercise at present  Goals    . Exercise 3x per week (30 min per time)     Recommend starting an exercise program 3 times a week at least 30 minutes at a time.       Fall Risk Fall Risk  11/27/2017 10/25/2017 08/18/2017 02/16/2017 12/20/2016  Falls in the past year? No No No No No   Is the patient's home free of loose throw rugs in walkways, pet beds, electrical cords, etc?   yes      Grab bars in the bathroom? yes      Handrails on the stairs?   yes      Adequate lighting?   yes  Depression Screen PHQ 2/9 Scores 11/27/2017 10/25/2017 08/18/2017 02/16/2017  PHQ - 2 Score 0 0 0 0  PHQ- 9 Score 0 0 - -     Cognitive Function     6CIT Screen 09/21/2016  What Year? 0 points  What month? 0 points  What time? 0 points  Count back from 20 0 points  Months in reverse 0 points  Repeat phrase 0 points  Total Score 0    Immunization History  Administered Date(s) Administered  .  Influenza Whole 05/08/2007  . Influenza,inj,Quad PF,6+ Mos 04/23/2013, 07/28/2014, 05/14/2015, 04/28/2016, 05/09/2017  . Pneumococcal Conjugate-13 03/26/2014  . Pneumococcal Polysaccharide-23 02/09/2016  . Td 05/09/2006  . Zoster 11/01/2012    Qualifies for Shingles Vaccine? Yes  Screening Tests Health Maintenance  Topic Date Due  . TETANUS/TDAP  11/01/2018 (Originally 05/09/2016)  . INFLUENZA VACCINE  03/15/2018  . MAMMOGRAM  08/04/2019  . COLONOSCOPY  11/02/2026  .  DEXA SCAN  Completed  . Hepatitis C Screening  Completed  . PNA vac Low Risk Adult  Completed    Cancer Screenings: Lung: Low Dose CT Chest recommended if Age 39-80 years, 30 pack-year currently smoking OR have quit w/in 15years. Patient does not qualify. Breast:  Up to date on Mammogram? Yes   Up to date of Bone Density/Dexa? Yes Colorectal: Up to date      Plan:      I have personally reviewed and noted the following in the patient's chart:   . Medical and social history . Use of alcohol, tobacco or illicit drugs  . Current medications and supplements . Functional ability and status . Nutritional status . Physical activity . Advanced directives . List of other physicians . Hospitalizations, surgeries, and ER visits in previous 12 months . Vitals . Screenings to include cognitive, depression, and falls . Referrals and appointments  In addition, I have reviewed and discussed with patient certain preventive protocols, quality metrics, and best practice recommendations. A written personalized care plan for preventive services as well as general preventive health recommendations were provided to patient.     Merceda Elks, LPN  [id number]

## 2017-11-27 NOTE — Patient Instructions (Signed)
Ms. Greenberger , Thank you for taking time to come for your Medicare Wellness Visit. I appreciate your ongoing commitment to your health goals. Please review the following plan we discussed and let me know if I can assist you in the future.   Screening recommendations/referrals: Colonoscopy: Due 2021 Mammogram: Due 07/2018 Bone Density: Up to date Recommended yearly ophthalmology/optometry visit for glaucoma screening and checkup Recommended yearly dental visit for hygiene and checkup  Vaccinations: Influenza vaccine: Due fall 2019 Pneumococcal vaccine: Due 2022 Tdap vaccine: Due Shingles vaccine: Due. Please call insurance for coverage    Advanced directives: Discussed today in office  Conditions/risks identified: None  Next appointment: 04/12/18   Preventive Care 65 Years and Older, Female Preventive care refers to lifestyle choices and visits with your health care provider that can promote health and wellness. What does preventive care include?  A yearly physical exam. This is also called an annual well check.  Dental exams once or twice a year.  Routine eye exams. Ask your health care provider how often you should have your eyes checked.  Personal lifestyle choices, including:  Daily care of your teeth and gums.  Regular physical activity.  Eating a healthy diet.  Avoiding tobacco and drug use.  Limiting alcohol use.  Practicing safe sex.  Taking low-dose aspirin every day.  Taking vitamin and mineral supplements as recommended by your health care provider. What happens during an annual well check? The services and screenings done by your health care provider during your annual well check will depend on your age, overall health, lifestyle risk factors, and family history of disease. Counseling  Your health care provider may ask you questions about your:  Alcohol use.  Tobacco use.  Drug use.  Emotional well-being.  Home and relationship  well-being.  Sexual activity.  Eating habits.  History of falls.  Memory and ability to understand (cognition).  Work and work Statistician.  Reproductive health. Screening  You may have the following tests or measurements:  Height, weight, and BMI.  Blood pressure.  Lipid and cholesterol levels. These may be checked every 5 years, or more frequently if you are over 14 years old.  Skin check.  Lung cancer screening. You may have this screening every year starting at age 64 if you have a 30-pack-year history of smoking and currently smoke or have quit within the past 15 years.  Fecal occult blood test (FOBT) of the stool. You may have this test every year starting at age 27.  Flexible sigmoidoscopy or colonoscopy. You may have a sigmoidoscopy every 5 years or a colonoscopy every 10 years starting at age 95.  Hepatitis C blood test.  Hepatitis B blood test.  Sexually transmitted disease (STD) testing.  Diabetes screening. This is done by checking your blood sugar (glucose) after you have not eaten for a while (fasting). You may have this done every 1-3 years.  Bone density scan. This is done to screen for osteoporosis. You may have this done starting at age 23.  Mammogram. This may be done every 1-2 years. Talk to your health care provider about how often you should have regular mammograms. Talk with your health care provider about your test results, treatment options, and if necessary, the need for more tests. Vaccines  Your health care provider may recommend certain vaccines, such as:  Influenza vaccine. This is recommended every year.  Tetanus, diphtheria, and acellular pertussis (Tdap, Td) vaccine. You may need a Td booster every 10 years.  Zoster  vaccine. You may need this after age 92.  Pneumococcal 13-valent conjugate (PCV13) vaccine. One dose is recommended after age 72.  Pneumococcal polysaccharide (PPSV23) vaccine. One dose is recommended after age  63. Talk to your health care provider about which screenings and vaccines you need and how often you need them. This information is not intended to replace advice given to you by your health care provider. Make sure you discuss any questions you have with your health care provider. Document Released: 08/28/2015 Document Revised: 04/20/2016 Document Reviewed: 06/02/2015 Elsevier Interactive Patient Education  2017 [place] Prevention in the Home Falls can cause injuries. They can happen to people of all ages. There are many things you can do to make your home safe and to help prevent falls. What can I do on the outside of my home?  Regularly fix the edges of walkways and driveways and fix any cracks.  Remove anything that might make you trip as you walk through a door, such as a raised step or threshold.  Trim any bushes or trees on the path to your home.  Use bright outdoor lighting.  Clear any walking paths of anything that might make someone trip, such as rocks or tools.  Regularly check to see if handrails are loose or broken. Make sure that both sides of any steps have handrails.  Any raised decks and porches should have guardrails on the edges.  Have any leaves, snow, or ice cleared regularly.  Use sand or salt on walking paths during winter.  Clean up any spills in your garage right away. This includes oil or grease spills. What can I do in the bathroom?  Use night lights.  Install grab bars by the toilet and in the tub and shower. Do not use towel bars as grab bars.  Use non-skid mats or decals in the tub or shower.  If you need to sit down in the shower, use a plastic, non-slip stool.  Keep the floor dry. Clean up any water that spills on the floor as soon as it happens.  Remove soap buildup in the tub or shower regularly.  Attach bath mats securely with double-sided non-slip rug tape.  Do not have throw rugs and other things on the floor that can make  you trip. What can I do in the bedroom?  Use night lights.  Make sure that you have a light by your bed that is easy to reach.  Do not use any sheets or blankets that are too big for your bed. They should not hang down onto the floor.  Have a firm chair that has side arms. You can use this for support while you get dressed.  Do not have throw rugs and other things on the floor that can make you trip. What can I do in the kitchen?  Clean up any spills right away.  Avoid walking on wet floors.  Keep items that you use a lot in easy-to-reach places.  If you need to reach something above you, use a strong step stool that has a grab bar.  Keep electrical cords out of the way.  Do not use floor polish or wax that makes floors slippery. If you must use wax, use non-skid floor wax.  Do not have throw rugs and other things on the floor that can make you trip. What can I do with my stairs?  Do not leave any items on the stairs.  Make sure that there are handrails  on both sides of the stairs and use them. Fix handrails that are broken or loose. Make sure that handrails are as long as the stairways.  Check any carpeting to make sure that it is firmly attached to the stairs. Fix any carpet that is loose or worn.  Avoid having throw rugs at the top or bottom of the stairs. If you do have throw rugs, attach them to the floor with carpet tape.  Make sure that you have a light switch at the top of the stairs and the bottom of the stairs. If you do not have them, ask someone to add them for you. What else can I do to help prevent falls?  Wear shoes that:  Do not have high heels.  Have rubber bottoms.  Are comfortable and fit you well.  Are closed at the toe. Do not wear sandals.  If you use a stepladder:  Make sure that it is fully opened. Do not climb a closed stepladder.  Make sure that both sides of the stepladder are locked into place.  Ask someone to hold it for you, if  possible.  Clearly mark and make sure that you can see:  Any grab bars or handrails.  First and last steps.  Where the edge of each step is.  Use tools that help you move around (mobility aids) if they are needed. These include:  Canes.  Walkers.  Scooters.  Crutches.  Turn on the lights when you go into a dark area. Replace any light bulbs as soon as they burn out.  Set up your furniture so you have a clear path. Avoid moving your furniture around.  If any of your floors are uneven, fix them.  If there are any pets around you, be aware of where they are.  Review your medicines with your doctor. Some medicines can make you feel dizzy. This can increase your chance of falling. Ask your doctor what other things that you can do to help prevent falls. This information is not intended to replace advice given to you by your health care provider. Make sure you discuss any questions you have with your health care provider. Document Released: 05/28/2009 Document Revised: 01/07/2016 Document Reviewed: 09/05/2014 Elsevier Interactive Patient Education  2017 [employer] for Adults  A healthy lifestyle and preventive care can promote health and wellness. Preventive health guidelines for adults include the following key practices.  . A routine yearly physical is a good way to check with your health care provider about your health and preventive screening. It is a chance to share any concerns and updates on your health and to receive a thorough exam.  . Visit your dentist for a routine exam and preventive care every 6 months. Brush your teeth twice a day and floss once a day. Good oral hygiene prevents tooth decay and gum disease.  . The frequency of eye exams is based on your age, health, family medical history, use  of contact lenses, and other factors. Follow your health care provider's ecommendations for frequency of eye exams.  . Eat a healthy diet. Foods like  vegetables, fruits, whole grains, low-fat dairy products, and lean protein foods contain the nutrients you need without too many calories. Decrease your intake of foods high in solid fats, added sugars, and salt. Eat the right amount of calories for you. Get information about a proper diet from your health care provider, if necessary.  . Regular physical exercise is one of the  most important things you can do for your health. Most adults should get at least 150 minutes of moderate-intensity exercise (any activity that increases your heart rate and causes you to sweat) each week. In addition, most adults need muscle-strengthening exercises on 2 or more days a week.  Silver Sneakers may be a benefit available to you. To determine eligibility, you may visit the website: www.silversneakers.com or contact program at 986-205-7311 Mon-Fri between 8AM-8PM.   . Maintain a healthy weight. The body mass index (BMI) is a screening tool to identify possible weight problems. It provides an estimate of body fat based on height and weight. Your health care provider can find your BMI and can help you achieve or maintain a healthy weight.   For adults 20 years and older: ? A BMI below 18.5 is considered underweight. ? A BMI of 18.5 to 24.9 is normal. ? A BMI of 25 to 29.9 is considered overweight. ? A BMI of 30 and above is considered obese.   . Maintain normal blood lipids and cholesterol levels by exercising and minimizing your intake of saturated fat. Eat a balanced diet with plenty of fruit and vegetables. Blood tests for lipids and cholesterol should begin at age 73 and be repeated every 5 years. If your lipid or cholesterol levels are high, you are over 50, or you are at high risk for heart disease, you may need your cholesterol levels checked more frequently. Ongoing high lipid and cholesterol levels should be treated with medicines if diet and exercise are not working.  . If you smoke, find out from your  health care provider how to quit. If you do not use tobacco, please do not start.  . If you choose to drink alcohol, please do not consume more than 2 drinks per day. One drink is considered to be 12 ounces (355 mL) of beer, 5 ounces (148 mL) of wine, or 1.5 ounces (44 mL) of liquor.  . If you are 38-57 years old, ask your health care provider if you should take aspirin to prevent strokes.  . Use sunscreen. Apply sunscreen liberally and repeatedly throughout the day. You should seek shade when your shadow is shorter than you. Protect yourself by wearing long sleeves, pants, a wide-brimmed hat, and sunglasses year round, whenever you are outdoors.  . Once a month, do a whole body skin exam, using a mirror to look at the skin on your back. Tell your health care provider of new moles, moles that have irregular borders, moles that are larger than a pencil eraser, or moles that have changed in shape or color.

## 2017-11-28 ENCOUNTER — Encounter: Payer: Self-pay | Admitting: Nurse Practitioner

## 2017-11-28 ENCOUNTER — Other Ambulatory Visit: Payer: Self-pay

## 2017-11-28 ENCOUNTER — Ambulatory Visit (INDEPENDENT_AMBULATORY_CARE_PROVIDER_SITE_OTHER): Payer: PPO | Admitting: Nurse Practitioner

## 2017-11-28 ENCOUNTER — Telehealth: Payer: Self-pay

## 2017-11-28 VITALS — BP 151/83 | HR 89 | Temp 97.6°F | Ht 66.0 in | Wt 220.6 lb

## 2017-11-28 DIAGNOSIS — D126 Benign neoplasm of colon, unspecified: Secondary | ICD-10-CM

## 2017-11-28 DIAGNOSIS — K219 Gastro-esophageal reflux disease without esophagitis: Secondary | ICD-10-CM | POA: Diagnosis not present

## 2017-11-28 DIAGNOSIS — R1319 Other dysphagia: Secondary | ICD-10-CM

## 2017-11-28 DIAGNOSIS — R1310 Dysphagia, unspecified: Secondary | ICD-10-CM

## 2017-11-28 MED ORDER — PEG 3350-KCL-NA BICARB-NACL 420 G PO SOLR: 4000.0000 mL | ORAL | 0 refills | Status: DC

## 2017-11-28 NOTE — Assessment & Plan Note (Signed)
Symptoms resolved, continue to monitor.  Follow-up in 6 months.

## 2017-11-28 NOTE — Assessment & Plan Note (Addendum)
History of multiple colon polyps which were all tubular adenoma.  Recommended repeat exam 1-3 years.  She is currently within that timeframe.  She also has a known family history of colon cancer in her father who was diagnosed at age 68.  At this point we will schedule her colonoscopy as previously recommended.  Follow-up in 6 months.  Proceed with colonoscopy on propofol/MAC with Dr. Oneida Alar in the near future. The risks, benefits, and alternatives have been discussed in detail with the patient. They state understanding and desire to proceed.   The patient is not on any anticoagulants, anxiolytics, chronic pain medications, or antidepressants.  Her last colonoscopy was completed under propofol.  We will plan for propofol based on previous sedation for colonoscopy.

## 2017-11-28 NOTE — Patient Instructions (Signed)
1. Continue taking her current medications. 2. We will schedule your colonoscopy for you. 3. Follow-up in 6 months. 4. Call us if you have any questions or concerns.    It was great to see you today!  Have a wonderful Spring and Summer!!!!    At [facility] we value your feedback. You may receive a survey about your visit today. Please share your experience as we strive to create trusting relationships with our patients to provide genuine, compassionate, quality care.

## 2017-11-28 NOTE — Progress Notes (Signed)
Referring Provider: Fayrene Helper, MD Primary Care Physician:  Fayrene Helper, MD Primary GI:  Dr. Oneida Alar  Chief Complaint  Patient presents with  . Gastroesophageal Reflux    f/u. Doing okay  . Consult    TCS    HPI:   Sarah Coleman is a 67 y.o. female who presents for follow-up on GERD and to set up a colonoscopy.  The patient was last seen in our office 07/19/2017 also for GERD.  Cardiac workup historically with low overall cardiovascular risk.  EGD 04/11/2017 which found distal esophageal web status post dilation, gastritis status post biopsy.  Likely mild gastritis.  Increase Protonix to 40 mg twice daily.  Symptoms likely from aspirin use.  Recommended avoid triggers.  At her last visit she was doing well overall, dysphagia resolved, GERD resolved and at that time was on daily PPI.  No other GI symptoms.  Last colonoscopy noted to be 11/01/2016 and up-to-date, due in 1-3 years.  Commended continue Protonix daily, follow-up in April 2019 to schedule surveillance colonoscopy because of multiple polyps on previous exams and family history of colon cancer.  Call with any questions or concerns.  Today she states she's doing well overall. GERD doing well on daily Protonix; has breakthrough symptoms rare/occasionally due to known dietary discretions. Denies dysphagia. Denies abdominal pain, N/V, hematochezia, melena, fever, chills, unintentional weight loss. Denies chest pain, dyspnea, dizziness, lightheadedness, syncope, near syncope. Denies any other upper or lower GI symptoms.  Past Medical History:  Diagnosis Date  . Allergic rhinitis   . Allergy   . Anemia   . Arthritis   . GERD (gastroesophageal reflux disease) 2013  . Hyperlipidemia   . Hypertension   . Metabolic syndrome X 2585  . Morbid obesity ([place]) 2000  . Obesity   . Prediabetes 2010    Past Surgical History:  Procedure Laterality Date  . BIOPSY  04/11/2017   Procedure: BIOPSY;  Surgeon: Danie Binder,  MD;  Location: [facility];  Service: Endoscopy;;  gastric bx's  . COLONOSCOPY N/A 10/07/2016   Procedure: COLONOSCOPY;  Surgeon: Danie Binder, MD;  Location: [facility];  Service: Endoscopy;  Laterality: N/A;  9:30 AM  . COLONOSCOPY WITH PROPOFOL N/A 11/01/2016   Procedure: COLONOSCOPY WITH PROPOFOL;  Surgeon: Danie Binder, MD;  Location: [facility];  Service: Endoscopy;  Laterality: N/A;  830   . ESOPHAGOGASTRODUODENOSCOPY (EGD) WITH PROPOFOL N/A 04/11/2017   Procedure: ESOPHAGOGASTRODUODENOSCOPY (EGD) WITH PROPOFOL;  Surgeon: Danie Binder, MD;  Location: [facility];  Service: Endoscopy;  Laterality: N/A;  10:45am  . GERD    . PARTIAL HYSTERECTOMY  1987   secondary to ovarian cyst   . POLYPECTOMY  11/01/2016   Procedure: POLYPECTOMY;  Surgeon: Danie Binder, MD;  Location: [facility];  Service: Endoscopy;;  colon  . SAVORY DILATION N/A 04/11/2017   Procedure: SAVORY DILATION;  Surgeon: Danie Binder, MD;  Location: [facility];  Service: Endoscopy;  Laterality: N/A;  . TUBAL LIGATION  1975    Current Outpatient Medications  Medication Sig Dispense Refill  . acetaminophen (TYLENOL) 325 MG tablet Take 325-650 mg by mouth every 3 (three) hours as needed for mild pain.     Marland Kitchen aspirin EC 81 MG tablet Take 81 mg by mouth daily.    Marland Kitchen azelastine (ASTELIN) 0.1 % nasal spray USE ONE SPRAY(S) IN EACH NOSTRIL TWICE DAILY AS DIRECTED 30 mL 12  . benzonatate (TESSALON)  100 MG capsule Take 1 capsule (100 mg total) by mouth 2 (two) times daily as needed for cough. 20 capsule 0  . CALCIUM-MAGNESIUM-ZINC PO Take 1 tablet by mouth 2 (two) times daily.     . carboxymethylcellulose (REFRESH PLUS) 0.5 % SOLN Place 1-2 drops into both eyes 3 (three) times daily as needed (for dry eyes).    . chlorthalidone (HYGROTON) 25 MG tablet Take 1 tablet (25 mg total) by mouth daily. 90 tablet 3  . cholecalciferol (VITAMIN D) 1000 units tablet Take 1,000 Units by mouth daily.    . cyclobenzaprine  (FLEXERIL) 10 MG tablet TAKE 1 TABLET BY MOUTH AT BEDTIME 30 tablet 3  . EQ ALLERGY RELIEF 10 MG tablet TAKE 1 TABLET BY MOUTH ONCE DAILY 90 tablet 1  . estradiol (ESTRACE) 0.1 MG/GM vaginal cream Place 1 Applicatorful vaginally every other day as needed (for vaginal discomfort). 42.5 g 3  . fluticasone (FLONASE) 50 MCG/ACT nasal spray USE 2 SPRAY(S) IN EACH NOSTRIL ONCE DAILY 16 g 3  . Iron-Vitamins (GERITOL PO) Take 1 tablet by mouth daily.    Marland Kitchen loratadine (CLARITIN) 10 MG tablet Take 10 mg by mouth daily.    Marland Kitchen lovastatin (MEVACOR) 20 MG tablet Take 1 tablet (20 mg total) at bedtime by mouth. 90 tablet 1  . meclizine (ANTIVERT) 12.5 MG tablet TAKE ONE TABLET BY MOUTH THREE TIMES DAILY AS NEEDED FOR DIZZINESS 90 tablet 2  . nitroGLYCERIN (NITROSTAT) 0.4 MG SL tablet Place 1 tablet (0.4 mg total) under the tongue every 5 (five) minutes as needed for chest pain (hold for SBP < 110). 30 tablet 0  . Omega-3 300 MG CAPS Take 1,200 mg by mouth daily.    . pantoprazole (PROTONIX) 40 MG tablet 1 PO 30 MINUTES PRIOR TO MEALS BID FOR 3 MOS THEN QD (Patient taking differently: 40 mg daily. 1 PO 30 MINUTES PRIOR TO MEALS BID FOR 3 MOS THEN QD) 60 tablet 11  . potassium chloride SA (K-DUR,KLOR-CON) 20 MEQ tablet Two tablets in the morning , one tablet at lunch, and two tablets in the evening 150 tablet 5  . ranitidine (ZANTAC) 300 MG tablet TAKE 1 TABLET BY MOUTH AT BEDTIME. IF NEEDED TO  CONTROL REFLUX/HEARTBURN. 30 tablet 5  . spironolactone (ALDACTONE) 25 MG tablet Take 0.5 tablets (12.5 mg total) by mouth daily. 45 tablet 3   No current facility-administered medications for this visit.     Allergies as of 11/28/2017 - Review Complete 11/28/2017  Allergen Reaction Noted  . Cinnamon Swelling 10/24/2016  . Ibuprofen Swelling 12/06/2007  . Penicillins Nausea And Vomiting 12/06/2007  . Sweet potato Swelling 10/24/2016    Family History  Problem Relation Age of Onset  . Heart attack Mother   . Heart  disease Mother 22       massive heart attack  . Prostate cancer Father   . Colon cancer Father 85  . Hypertension Sister   . Ovarian cancer Sister   . Cancer Brother 82       oral   . Hypertension Brother   . Heart disease Brother     Social History   Socioeconomic History  . Marital status: Married    Spouse name: Not on file  . Number of children: 5  . Years of education: Not on file  . Highest education level: Not on file  Occupational History  . Occupation: retired   Scientific laboratory technician  . Financial resource strain: Not very hard  . Food  insecurity:    Worry: Never true    Inability: Never true  . Transportation needs:    Medical: No    Non-medical: No  Tobacco Use  . Smoking status: Former Smoker    Packs/day: 1.00    Years: 3.00    Pack years: 3.00    Last attempt to quit: 08/15/1974    Years since quitting: 43.3  . Smokeless tobacco: Never Used  Substance and Sexual Activity  . Alcohol use: No    Alcohol/week: 0.0 oz  . Drug use: No  . Sexual activity: Yes    Birth control/protection: Surgical  Lifestyle  . Physical activity:    Days per week: 0 days    Minutes per session: 0 min  . Stress: Not at all  Relationships  . Social connections:    Talks on phone: More than three times a week    Gets together: Never    Attends religious service: More than 4 times per year    Active member of club or organization: No    Attends meetings of clubs or organizations: Never    Relationship status: Married  Other Topics Concern  . Not on file  Social History Narrative  . Not on file    Review of Systems: Complete ROS negative except as per HPI.   Physical Exam: BP (!) 151/83   Pulse 89   Temp 97.6 F (36.4 C) (Oral)   Ht 5\' 6"  (1.676 m)   Wt 220 lb 9.6 oz (100.1 kg)   BMI 35.61 kg/m  General:   Alert and oriented. Pleasant and cooperative. Well-nourished and well-developed.  Eyes:  Without icterus, sclera clear and conjunctiva pink.  Ears:  Normal  auditory acuity. Cardiovascular:  S1, S2 present without murmurs appreciated. Extremities without clubbing or edema. Respiratory:  Clear to auscultation bilaterally. No wheezes, rales, or rhonchi. No distress.  Gastrointestinal:  +BS, soft, non-tender and non-distended. No HSM noted. No guarding or rebound. No masses appreciated.  Rectal:  Deferred  Musculoskalatal:  Symmetrical without gross deformities. Skin:  Intact without significant lesions or rashes. Neurologic:  Alert and oriented x4;  grossly normal neurologically. Psych:  Alert and cooperative. Normal mood and affect. Heme/Lymph/Immune: No excessive bruising noted.    11/28/2017 10:39 AM   Disclaimer: This note was dictated with voice recognition software. Similar sounding words can inadvertently be transcribed and may not be corrected upon review.

## 2017-11-28 NOTE — Telephone Encounter (Signed)
Tried to call pt to inform of pre-op appt 01/16/18 at 12:45pm, no answer, LMOAM. Letter mailed.

## 2017-11-28 NOTE — Assessment & Plan Note (Signed)
Doing well on once daily PPI.  Continues on aspirin.  Breakthrough GERD symptoms only with dietary discretion's.  Continue to monitor, continue PPI, follow-up in 6 months.

## 2017-11-28 NOTE — Progress Notes (Signed)
CC'ED TO PCP 

## 2018-01-06 ENCOUNTER — Other Ambulatory Visit: Payer: Self-pay | Admitting: Cardiology

## 2018-01-15 NOTE — Patient Instructions (Signed)
Sarah Coleman  01/15/2018     @PREFPERIOPPHARMACY @   Your procedure is scheduled on  01/23/2018 .  Report to [facility] at  1200  P.M.  Call this number if you have problems the morning of surgery:  [phone]   Remember:  No food after midnight.  You may drink clear liquids until (follow prep instructions from Dr Nona Dell office.     Clear liquids allowed are:                    Water, Juice (non-citric and without pulp), Carbonated beverages, Clear Tea, Black Coffee only, Plain Jell-O only, Gatorade and Plain Popsicles only    Take these medicines the morning of surgery with A SIP OF WATER  Claritin, antivert, protonix.    Do not wear jewelry, make-up or nail polish.  Do not wear lotions, powders, or perfumes, or deodorant.  Do not shave 48 hours prior to surgery.  Men may shave face and neck.  Do not bring valuables to the hospital.  [facility] is not responsible for any belongings or valuables.  Contacts, dentures or bridgework may not be worn into surgery.  Leave your suitcase in the car.  After surgery it may be brought to your room.  For patients admitted to the hospital, discharge time will be determined by your treatment team.  Patients discharged the day of surgery will not be allowed to drive home.   Name and phone number of your driver:   family Special instructions:  Follow the diet and prep instructions given to you by Dr Nona Dell office.  Please read over the following fact sheets that you were given. Pain Booklet, Coughing and Deep Breathing, Anesthesia Post-op Instructions and Care and Recovery After Surgery       Colonoscopy, Adult A colonoscopy is an exam to look at the large intestine. It is done to check for problems, such as:  Lumps (tumors).  Growths (polyps).  Swelling (inflammation).  Bleeding.  What happens before the procedure? Eating and drinking Follow instructions from your doctor about eating and drinking. These  instructions may include:  A few days before the procedure - follow a low-fiber diet. ? Avoid nuts. ? Avoid seeds. ? Avoid dried fruit. ? Avoid raw fruits. ? Avoid vegetables.  1-3 days before the procedure - follow a clear liquid diet. Avoid liquids that have red or purple dye. Drink only clear liquids, such as: ? Clear broth or bouillon. ? Black coffee or tea. ? Clear juice. ? Clear soft drinks or sports drinks. ? Gelatin dessert. ? Popsicles.  On the day of the procedure - do not eat or drink anything during the 2 hours before the procedure.  Bowel prep If you were prescribed an oral bowel prep:  Take it as told by your doctor. Starting the day before your procedure, you will need to drink a lot of liquid. The liquid will cause you to poop (have bowel movements) until your poop is almost clear or light green.  If your skin or butt gets irritated from diarrhea, you may: ? Wipe the area with wipes that have medicine in them, such as adult wet wipes with aloe and vitamin E. ? Put something on your skin that soothes the area, such as petroleum jelly.  If you throw up (vomit) while drinking the bowel prep, take a break for up to 60 minutes. Then begin the bowel prep again.  If you keep throwing up and you cannot take the bowel prep without throwing up, call your doctor.  General instructions  Ask your doctor about changing or stopping your normal medicines. This is important if you take diabetes medicines or blood thinners.  Plan to have someone take you home from the hospital or clinic. What happens during the procedure?  An IV tube may be put into one of your veins.  You will be given medicine to help you relax (sedative).  To reduce your risk of infection: ? Your doctors will wash their hands. ? Your anal area will be washed with soap.  You will be asked to lie on your side with your knees bent.  Your doctor will get a long, thin, flexible tube ready. The tube will have  a camera and a light on the end.  The tube will be put into your anus.  The tube will be gently put into your large intestine.  Air will be delivered into your large intestine to keep it open. You may feel some pressure or cramping.  The camera will be used to take photos.  A small tissue sample may be removed from your body to be looked at under a microscope (biopsy). If any possible problems are found, the tissue will be sent to a lab for testing.  If small growths are found, your doctor may remove them and have them checked for cancer.  The tube that was put into your anus will be slowly removed. The procedure may vary among doctors and hospitals. What happens after the procedure?  Your doctor will check on you often until the medicines you were given have worn off.  Do not drive for 24 hours after the procedure.  You may have a small amount of blood in your poop.  You may pass gas.  You may have mild cramps or bloating in your belly (abdomen).  It is up to you to get the results of your procedure. Ask your doctor, or the department performing the procedure, when your results will be ready. This information is not intended to replace advice given to you by your health care provider. Make sure you discuss any questions you have with your health care provider. Document Released: 09/03/2010 Document Revised: 06/01/2016 Document Reviewed: 10/13/2015 Elsevier Interactive Patient Education  2017 [employer]  Colonoscopy, Adult, Care After This sheet gives you information about how to care for yourself after your procedure. Your health care provider may also give you more specific instructions. If you have problems or questions, contact your health care provider. What can I expect after the procedure? After the procedure, it is common to have:  A small amount of blood in your stool for 24 hours after the procedure.  Some gas.  Mild abdominal cramping or bloating.  Follow  these instructions at home: General instructions   For the first 24 hours after the procedure: ? Do not drive or use machinery. ? Do not sign important documents. ? Do not drink alcohol. ? Do your regular daily activities at a slower pace than normal. ? Eat soft, easy-to-digest foods. ? Rest often.  Take over-the-counter or prescription medicines only as told by your health care provider.  It is up to you to get the results of your procedure. Ask your health care provider, or the department performing the procedure, when your results will be ready. Relieving cramping and bloating  Try walking around when you have cramps or feel bloated.  Apply  heat to your abdomen as told by your health care provider. Use a heat source that your health care provider recommends, such as a moist heat pack or a heating pad. ? Place a towel between your skin and the heat source. ? Leave the heat on for 20-30 minutes. ? Remove the heat if your skin turns bright red. This is especially important if you are unable to feel pain, heat, or cold. You may have a greater risk of getting burned. Eating and drinking  Drink enough fluid to keep your urine clear or pale yellow.  Resume your normal diet as instructed by your health care provider. Avoid heavy or fried foods that are hard to digest.  Avoid drinking alcohol for as long as instructed by your health care provider. Contact a health care provider if:  You have blood in your stool 2-3 days after the procedure. Get help right away if:  You have more than a small spotting of blood in your stool.  You pass large blood clots in your stool.  Your abdomen is swollen.  You have nausea or vomiting.  You have a fever.  You have increasing abdominal pain that is not relieved with medicine. This information is not intended to replace advice given to you by your health care provider. Make sure you discuss any questions you have with your health care  provider. Document Released: 03/15/2004 Document Revised: 04/25/2016 Document Reviewed: 10/13/2015 Elsevier Interactive Patient Education  2018 [place] Anesthesia is a term that refers to techniques, procedures, and medicines that help a person stay safe and comfortable during a medical procedure. Monitored anesthesia care, or sedation, is one type of anesthesia. Your anesthesia specialist may recommend sedation if you will be having a procedure that does not require you to be unconscious, such as:  Cataract surgery.  A dental procedure.  A biopsy.  A colonoscopy.  During the procedure, you may receive a medicine to help you relax (sedative). There are three levels of sedation:  Mild sedation. At this level, you may feel awake and relaxed. You will be able to follow directions.  Moderate sedation. At this level, you will be sleepy. You may not remember the procedure.  Deep sedation. At this level, you will be asleep. You will not remember the procedure.  The more medicine you are given, the deeper your level of sedation will be. Depending on how you respond to the procedure, the anesthesia specialist may change your level of sedation or the type of anesthesia to fit your needs. An anesthesia specialist will monitor you closely during the procedure. Let your health care provider know about:  Any allergies you have.  All medicines you are taking, including vitamins, herbs, eye drops, creams, and over-the-counter medicines.  Any use of steroids (by mouth or as a cream).  Any problems you or family members have had with sedatives and anesthetic medicines.  Any blood disorders you have.  Any surgeries you have had.  Any medical conditions you have, such as sleep apnea.  Whether you are pregnant or may be pregnant.  Any use of cigarettes, alcohol, or street drugs. What are the risks? Generally, this is a safe procedure. However, problems may  occur, including:  Getting too much medicine (oversedation).  Nausea.  Allergic reaction to medicines.  Trouble breathing. If this happens, a breathing tube may be used to help with breathing. It will be removed when you are awake and breathing on your own.  Heart trouble.  Lung trouble.  Before the procedure Staying hydrated Follow instructions from your health care provider about hydration, which may include:  Up to 2 hours before the procedure - you may continue to drink clear liquids, such as water, clear fruit juice, black coffee, and plain tea.  Eating and drinking restrictions Follow instructions from your health care provider about eating and drinking, which may include:  8 hours before the procedure - stop eating heavy meals or foods such as meat, fried foods, or fatty foods.  6 hours before the procedure - stop eating light meals or foods, such as toast or cereal.  6 hours before the procedure - stop drinking milk or drinks that contain milk.  2 hours before the procedure - stop drinking clear liquids.  Medicines Ask your health care provider about:  Changing or stopping your regular medicines. This is especially important if you are taking diabetes medicines or blood thinners.  Taking medicines such as aspirin and ibuprofen. These medicines can thin your blood. Do not take these medicines before your procedure if your health care provider instructs you not to.  Tests and exams  You will have a physical exam.  You may have blood tests done to show: ? How well your kidneys and liver are working. ? How well your blood can clot.  General instructions  Plan to have someone take you home from the hospital or clinic.  If you will be going home right after the procedure, plan to have someone with you for 24 hours.  What happens during the procedure?  Your blood pressure, heart rate, breathing, level of pain and overall condition will be monitored.  An IV  tube will be inserted into one of your veins.  Your anesthesia specialist will give you medicines as needed to keep you comfortable during the procedure. This may mean changing the level of sedation.  The procedure will be performed. After the procedure  Your blood pressure, heart rate, breathing rate, and blood oxygen level will be monitored until the medicines you were given have worn off.  Do not drive for 24 hours if you received a sedative.  You may: ? Feel sleepy, clumsy, or nauseous. ? Feel forgetful about what happened after the procedure. ? Have a sore throat if you had a breathing tube during the procedure. ? Vomit. This information is not intended to replace advice given to you by your health care provider. Make sure you discuss any questions you have with your health care provider. Document Released: 04/27/2005 Document Revised: 01/08/2016 Document Reviewed: 11/22/2015 Elsevier Interactive Patient Education  2018 [place], Care After These instructions provide you with information about caring for yourself after your procedure. Your health care provider may also give you more specific instructions. Your treatment has been planned according to current medical practices, but problems sometimes occur. Call your health care provider if you have any problems or questions after your procedure. What can I expect after the procedure? After your procedure, it is common to:  Feel sleepy for several hours.  Feel clumsy and have poor balance for several hours.  Feel forgetful about what happened after the procedure.  Have poor judgment for several hours.  Feel nauseous or vomit.  Have a sore throat if you had a breathing tube during the procedure.  Follow these instructions at home: For at least 24 hours after the procedure:   Do not: ? Participate in activities in which you could fall  or become injured. ? Drive. ? Use heavy  machinery. ? Drink alcohol. ? Take sleeping pills or medicines that cause drowsiness. ? Make important decisions or sign legal documents. ? Take care of children on your own.  Rest. Eating and drinking  Follow the diet that is recommended by your health care provider.  If you vomit, drink water, juice, or soup when you can drink without vomiting.  Make sure you have little or no nausea before eating solid foods. General instructions  Have a responsible adult stay with you until you are awake and alert.  Take over-the-counter and prescription medicines only as told by your health care provider.  If you smoke, do not smoke without supervision.  Keep all follow-up visits as told by your health care provider. This is important. Contact a health care provider if:  You keep feeling nauseous or you keep vomiting.  You feel light-headed.  You develop a rash.  You have a fever. Get help right away if:  You have trouble breathing. This information is not intended to replace advice given to you by your health care provider. Make sure you discuss any questions you have with your health care provider. Document Released: 11/22/2015 Document Revised: 03/23/2016 Document Reviewed: 11/22/2015 Elsevier Interactive Patient Education  [employer].

## 2018-01-16 ENCOUNTER — Other Ambulatory Visit: Payer: Self-pay

## 2018-01-16 ENCOUNTER — Encounter (HOSPITAL_COMMUNITY)
Admission: RE | Admit: 2018-01-16 | Discharge: 2018-01-16 | Disposition: A | Discharge status: Home / Self Care | Payer: PPO | Source: Ambulatory Visit | Attending: Gastroenterology | Admitting: Gastroenterology

## 2018-01-16 ENCOUNTER — Encounter (HOSPITAL_COMMUNITY): Payer: Self-pay

## 2018-01-16 DIAGNOSIS — Z01812 Encounter for preprocedural laboratory examination: Secondary | ICD-10-CM | POA: Insufficient documentation

## 2018-01-16 HISTORY — DX: Unspecified convulsions: R56.9

## 2018-01-16 LAB — CBC WITH DIFFERENTIAL/PLATELET
BASOS ABS: 0 10*3/uL (ref 0.0–0.1)
Basophils Relative: 0 %
Eosinophils Absolute: 0.1 10*3/uL (ref 0.0–0.7)
Eosinophils Relative: 1 %
HEMATOCRIT: 37.3 % (ref 36.0–46.0)
HEMOGLOBIN: 12.2 g/dL (ref 12.0–15.0)
LYMPHS PCT: 49 %
Lymphs Abs: 3.5 10*3/uL (ref 0.7–4.0)
MCH: 29.9 pg (ref 26.0–34.0)
MCHC: 32.7 g/dL (ref 30.0–36.0)
MCV: 91.4 fL (ref 78.0–100.0)
MONO ABS: 0.4 10*3/uL (ref 0.1–1.0)
MONOS PCT: 6 %
NEUTROS ABS: 3.1 10*3/uL (ref 1.7–7.7)
Neutrophils Relative %: 44 %
Platelets: 355 10*3/uL (ref 150–400)
RBC: 4.08 MIL/uL (ref 3.87–5.11)
RDW: 14 % (ref 11.5–15.5)
WBC: 7 10*3/uL (ref 4.0–10.5)

## 2018-01-16 LAB — BASIC METABOLIC PANEL
ANION GAP: 10 (ref 5–15)
BUN: 11 mg/dL (ref 6–20)
CALCIUM: 9.8 mg/dL (ref 8.9–10.3)
CHLORIDE: 101 mmol/L (ref 101–111)
CO2: 26 mmol/L (ref 22–32)
Creatinine, Ser: 0.94 mg/dL (ref 0.44–1.00)
GFR calc Af Amer: >60 mL/min (ref >60)
GFR calc non Af Amer: >60 mL/min (ref >60)
GLUCOSE: 93 mg/dL (ref 65–99)
POTASSIUM: 3.3 mmol/L — AB (ref 3.5–5.1)
Sodium: 137 mmol/L (ref 135–145)

## 2018-01-17 NOTE — Pre-Procedure Instructions (Signed)
Called patient and left her a message about her potassium Rx and stopping her diuretic the day before her procedure. Told her to call back for any questions.

## 2018-01-18 ENCOUNTER — Telehealth: Payer: Self-pay | Admitting: General Practice

## 2018-01-18 NOTE — Telephone Encounter (Signed)
Patient called in stating she is already taking Potassium that is prescribed by Dr. Moshe Cipro 40 meq in the morning 20 meg at lunch and 40 meq at dinner.  Please advise if you want her to contact Dr. Moshe Cipro to adjust her potassium.   Routing to Dr. Oneida Alar   Patient can be reached at [phone] or [phone].

## 2018-01-19 ENCOUNTER — Telehealth: Payer: Self-pay | Admitting: *Deleted

## 2018-01-19 NOTE — Telephone Encounter (Signed)
Spoke with patient is aware new procedure time on 01/23/18 is now 12:15pm. She needs to arrive at 10:15am per Hoyle Sauer. Patient aware of this as well. Patient will drink 2nd half of prep at 7:15am and NPO after 9:15am. Nothing further needed

## 2018-01-19 NOTE — Telephone Encounter (Signed)
Patient made aware.

## 2018-01-19 NOTE — Telephone Encounter (Signed)
PLEASE CALL PT. She can continue with KCL as prescribed.she needs to drink 1 CUP OF OJ  EVERY MORNING AND EAT A BANANA IN ADDITION TO TAKING POTASSIUM.

## 2018-01-23 ENCOUNTER — Ambulatory Visit (HOSPITAL_COMMUNITY)
Admission: RE | Admit: 2018-01-23 | Discharge: 2018-01-23 | Disposition: A | Discharge status: Home / Self Care | Payer: PPO | Source: Ambulatory Visit | Attending: Gastroenterology | Admitting: Gastroenterology

## 2018-01-23 ENCOUNTER — Encounter (HOSPITAL_COMMUNITY)
Admission: RE | Disposition: A | Discharge status: Home / Self Care | Payer: Self-pay | Source: Ambulatory Visit | Attending: Gastroenterology

## 2018-01-23 ENCOUNTER — Encounter (HOSPITAL_COMMUNITY): Payer: Self-pay | Admitting: *Deleted

## 2018-01-23 ENCOUNTER — Ambulatory Visit (HOSPITAL_COMMUNITY): Payer: PPO | Admitting: Anesthesiology

## 2018-01-23 DIAGNOSIS — E785 Hyperlipidemia, unspecified: Secondary | ICD-10-CM | POA: Diagnosis not present

## 2018-01-23 DIAGNOSIS — K644 Residual hemorrhoidal skin tags: Secondary | ICD-10-CM | POA: Insufficient documentation

## 2018-01-23 DIAGNOSIS — R7303 Prediabetes: Secondary | ICD-10-CM | POA: Diagnosis not present

## 2018-01-23 DIAGNOSIS — Z1211 Encounter for screening for malignant neoplasm of colon: Secondary | ICD-10-CM | POA: Diagnosis not present

## 2018-01-23 DIAGNOSIS — Z8042 Family history of malignant neoplasm of prostate: Secondary | ICD-10-CM | POA: Insufficient documentation

## 2018-01-23 DIAGNOSIS — K6389 Other specified diseases of intestine: Secondary | ICD-10-CM | POA: Diagnosis not present

## 2018-01-23 DIAGNOSIS — Z8041 Family history of malignant neoplasm of ovary: Secondary | ICD-10-CM | POA: Insufficient documentation

## 2018-01-23 DIAGNOSIS — D123 Benign neoplasm of transverse colon: Secondary | ICD-10-CM

## 2018-01-23 DIAGNOSIS — Z800 Family history of malignant neoplasm of digestive organs: Secondary | ICD-10-CM | POA: Diagnosis not present

## 2018-01-23 DIAGNOSIS — Z8249 Family history of ischemic heart disease and other diseases of the circulatory system: Secondary | ICD-10-CM | POA: Diagnosis not present

## 2018-01-23 DIAGNOSIS — D122 Benign neoplasm of ascending colon: Secondary | ICD-10-CM

## 2018-01-23 DIAGNOSIS — D125 Benign neoplasm of sigmoid colon: Secondary | ICD-10-CM | POA: Insufficient documentation

## 2018-01-23 DIAGNOSIS — Q438 Other specified congenital malformations of intestine: Secondary | ICD-10-CM | POA: Diagnosis not present

## 2018-01-23 DIAGNOSIS — M1990 Unspecified osteoarthritis, unspecified site: Secondary | ICD-10-CM | POA: Diagnosis not present

## 2018-01-23 DIAGNOSIS — K648 Other hemorrhoids: Secondary | ICD-10-CM | POA: Diagnosis not present

## 2018-01-23 DIAGNOSIS — Z86010 Personal history of colonic polyps: Secondary | ICD-10-CM | POA: Insufficient documentation

## 2018-01-23 DIAGNOSIS — D649 Anemia, unspecified: Secondary | ICD-10-CM | POA: Diagnosis not present

## 2018-01-23 DIAGNOSIS — Z7982 Long term (current) use of aspirin: Secondary | ICD-10-CM | POA: Diagnosis not present

## 2018-01-23 DIAGNOSIS — J309 Allergic rhinitis, unspecified: Secondary | ICD-10-CM | POA: Diagnosis not present

## 2018-01-23 DIAGNOSIS — Z79899 Other long term (current) drug therapy: Secondary | ICD-10-CM | POA: Insufficient documentation

## 2018-01-23 DIAGNOSIS — I10 Essential (primary) hypertension: Secondary | ICD-10-CM | POA: Diagnosis not present

## 2018-01-23 DIAGNOSIS — K219 Gastro-esophageal reflux disease without esophagitis: Secondary | ICD-10-CM | POA: Insufficient documentation

## 2018-01-23 DIAGNOSIS — Z87891 Personal history of nicotine dependence: Secondary | ICD-10-CM | POA: Insufficient documentation

## 2018-01-23 DIAGNOSIS — D126 Benign neoplasm of colon, unspecified: Secondary | ICD-10-CM

## 2018-01-23 DIAGNOSIS — K5730 Diverticulosis of large intestine without perforation or abscess without bleeding: Secondary | ICD-10-CM | POA: Insufficient documentation

## 2018-01-23 HISTORY — PX: COLONOSCOPY WITH PROPOFOL: SHX5780

## 2018-01-23 HISTORY — PX: POLYPECTOMY: SHX5525

## 2018-01-23 SURGERY — COLONOSCOPY WITH PROPOFOL
Anesthesia: Monitor Anesthesia Care

## 2018-01-23 MED ORDER — HYDROCODONE-ACETAMINOPHEN 7.5-325 MG PO TABS: 1.0000 | ORAL_TABLET | Freq: Once | ORAL | Status: DC | PRN

## 2018-01-23 MED ORDER — MEPERIDINE HCL 100 MG/ML IJ SOLN: 6.2500 mg | INTRAMUSCULAR | Status: DC | PRN

## 2018-01-23 MED ORDER — LACTATED RINGERS IV SOLN
INTRAVENOUS | Status: DC | PRN
  Administered 2018-01-23: 10:00:00 via INTRAVENOUS

## 2018-01-23 MED ORDER — HYDROMORPHONE HCL 1 MG/ML IJ SOLN: 0.2500 mg | INTRAMUSCULAR | Status: DC | PRN

## 2018-01-23 MED ORDER — PROPOFOL 500 MG/50ML IV EMUL
INTRAVENOUS | Status: DC | PRN
  Administered 2018-01-23: 120 ug/kg/min via INTRAVENOUS
  Administered 2018-01-23: 12:00:00 via INTRAVENOUS

## 2018-01-23 MED ORDER — LACTATED RINGERS IV SOLN: INTRAVENOUS | Status: DC

## 2018-01-23 MED ORDER — PROMETHAZINE HCL 25 MG/ML IJ SOLN: 6.2500 mg | INTRAMUSCULAR | Status: DC | PRN

## 2018-01-23 MED ORDER — CHLORHEXIDINE GLUCONATE CLOTH 2 % EX PADS: 6.0000 | MEDICATED_PAD | Freq: Once | CUTANEOUS | Status: DC

## 2018-01-23 MED ORDER — MIDAZOLAM HCL 5 MG/5ML IJ SOLN
INTRAMUSCULAR | Status: DC | PRN
  Administered 2018-01-23: 2 mg via INTRAVENOUS

## 2018-01-23 NOTE — Anesthesia Postprocedure Evaluation (Signed)
Anesthesia Post Note  Patient: Sarah Coleman  Procedure(s) Performed: COLONOSCOPY WITH PROPOFOL (N/A ) POLYPECTOMY  Patient location during evaluation: PACU Anesthesia Type: MAC Level of consciousness: awake Pain management: pain level controlled Vital Signs Assessment: post-procedure vital signs reviewed and stable Respiratory status: spontaneous breathing, nonlabored ventilation and respiratory function stable Cardiovascular status: blood pressure returned to baseline Postop Assessment: no apparent nausea or vomiting Anesthetic complications: no     Last Vitals:  Vitals:   01/23/18 1003  BP: 126/89  Resp: 18  Temp: 36.7 C  SpO2: 97%    Last Pain:  Vitals:   01/23/18 1003  TempSrc: Oral  PainSc: 0-No pain                 Saundra Gin J

## 2018-01-23 NOTE — Op Note (Signed)
[facility] Patient Name: Sarah Coleman Procedure Date: 01/23/2018 11:30 AM MRN: [id number] Date of Birth: [birth year] Attending MD: Barney Drain MD, MD CSN: [id number] Age: 67 Admit Type: Outpatient Procedure:                Colonoscopy WITH COLD SNARE/SNARE CAUTERY                            POLYEPCTOMY Indications:              Personal history of colonic polyps Providers:                Barney Drain MD, MD, Lurline Del, RN, Aram Candela Referring MD:             Norwood Levo. Simpson MD, MD Medicines:                Propofol per Anesthesia Complications:            No immediate complications. Estimated Blood Loss:     Estimated blood loss was minimal. Procedure:                Pre-Anesthesia Assessment:                           - Prior to the procedure, a History and Physical                            was performed, and patient medications and                            allergies were reviewed. The patient's tolerance of                            previous anesthesia was also reviewed. The risks                            and benefits of the procedure and the sedation                            options and risks were discussed with the patient.                            All questions were answered, and informed consent                            was obtained. Prior Anticoagulants: The patient has                            taken aspirin, last dose was 2 days prior to                            procedure. ASA Grade Assessment: II - A patient                            with mild systemic disease. After reviewing the  risks and benefits, the patient was deemed in                            satisfactory condition to undergo the procedure.                            After obtaining informed consent, the colonoscope                            was passed under direct vision. Throughout the                            procedure, the patient's blood pressure,  pulse, and                            oxygen saturations were monitored continuously. The                            EC-[id number] ([id number]) scope was introduced through                            the anus and advanced to the the cecum, identified                            by appendiceal orifice and ileocecal valve. The                            colonoscopy was somewhat difficult due to a                            redundant colon. Successful completion of the                            procedure was aided by straightening and shortening                            the scope to obtain bowel loop reduction and                            COLOWRAP. The patient tolerated the procedure well.                            The quality of the bowel preparation was excellent.                            The ileocecal valve, appendiceal orifice, and                            rectum were photographed. Scope In: 12:08:29 PM Scope Out: 12:35:35 PM Scope Withdrawal Time: 0 hours 19 minutes 47 seconds  Total Procedure Duration: 0 hours 27 minutes 6 seconds  Findings:      Two sessile polyps were found in the splenic flexure and ascending       colon. The  polyps were 4 to 5 mm in size. These polyps were removed with       a cold snare. Resection and retrieval were complete.      A 4 mm polyp was found in the sigmoid colon. The polyp was sessile. The       polyp was removed with a hot snare. Resection and retrieval were       complete.      Multiple small and large-mouthed diverticula were found in the       recto-sigmoid colon and sigmoid colon.      External and internal hemorrhoids were found during retroflexion. The       hemorrhoids were moderate.      The recto-sigmoid colon, sigmoid colon and descending colon were       moderately redundant. Impression:               - Two 4 to 5 mm polyps at the splenic flexure and                            in the ascending colon, removed with a cold snare.                             Resected and retrieved.                           - One 4 mm polyp in the sigmoid colon, removed with                            a hot snare. Resected and retrieved.                           - MODERATE Diverticulosis in the recto-sigmoid                            colon and in the sigmoid colon.                           - External and internal hemorrhoids.                           - Redundant LEFT colon. Moderate Sedation:      Per Anesthesia Care Recommendation:           - High fiber diet.                           - Continue present medications.                           - Await pathology results.                           - Repeat colonoscopy in 3 years for surveillance.                           - Return to my office in 6 months.                           -  Patient has a contact number available for                            emergencies. The signs and symptoms of potential                            delayed complications were discussed with the                            patient. Return to normal activities tomorrow.                            Written discharge instructions were provided to the                            patient. Procedure Code(s):        --- Professional ---                           [phone], Colonoscopy, flexible; with removal of                            tumor(s), polyp(s), or other lesion(s) by snare                            technique Diagnosis Code(s):        --- Professional ---                           D12.3, Benign neoplasm of transverse colon (hepatic                            flexure or splenic flexure)                           D12.2, Benign neoplasm of ascending colon                           D12.5, Benign neoplasm of sigmoid colon                           K64.8, Other hemorrhoids                           Z86.010, Personal history of colonic polyps                           K57.30, Diverticulosis of large intestine without                             perforation or abscess without bleeding                           Q43.8, Other specified congenital malformations of  intestine CPT copyright 2017 American Medical Association. All rights reserved. The codes documented in this report are preliminary and upon coder review may  be revised to meet current compliance requirements. Barney Drain, MD Barney Drain MD, MD 01/23/2018 12:47:39 PM This report has been signed electronically. Number of Addenda: 0

## 2018-01-23 NOTE — Transfer of Care (Signed)
Immediate Anesthesia Transfer of Care Note  Patient: Sarah Coleman  Procedure(s) Performed: COLONOSCOPY WITH PROPOFOL (N/A ) POLYPECTOMY  Patient Location: PACU  Anesthesia Type:MAC  Level of Consciousness: awake and patient cooperative  Airway & Oxygen Therapy: Patient Spontanous Breathing  Post-op Assessment: Report given to RN and Post -op Vital signs reviewed and stable  Post vital signs: Reviewed and stable  Last Vitals:  Vitals Value Taken Time  BP    Temp    Pulse    Resp    SpO2      Last Pain:  Vitals:   01/23/18 1003  TempSrc: Oral  PainSc: 0-No pain      Patients Stated Pain Goal: 7 (08/02/74 8832)  Complications: No apparent anesthesia complications

## 2018-01-23 NOTE — Discharge Instructions (Signed)
You have MODERATE internal hemorrhoids and diverticulosis IN YOUR LEFT COLON. YOU HAD THREE POLYPS REMOVED.    DRINK WATER TO KEEP YOUR URINE LIGHT YELLOW.  CONTINUE YOUR WEIGHT LOSS EFFORTS. YOUR BODY MASS INDEX IS OVER 30 WHICH MEANS YOU ARE OBESE. OBESITY IS ASSOCIATED WITH AN INCREASED FOR CIRRHOSIS AND ALL CANCERS, INCLUDING ESOPHAGEAL AND COLON CANCER.  FOLLOW A HIGH FIBER DIET. AVOID ITEMS THAT CAUSE BLOATING. See info below.  YOUR BIOPSY RESULTS WILL BE AVAILABLE IN 7 DAYS.  USE PREPARATION H FOUR TIMES  A DAY IF NEEDED TO RELIEVE RECTAL PAIN/PRESSURE/BLEEDING.  Next colonoscopy in 3 years. YOUR SISTERS, BROTHERS, CHILDREN, AND PARENTS NEED TO HAVE A COLONOSCOPY STARTING AT THE AGE OF 40.   Colonoscopy Care After Read the instructions outlined below and refer to this sheet in the next week. These discharge instructions provide you with general information on caring for yourself after you leave the hospital. While your treatment has been planned according to the most current medical practices available, unavoidable complications occasionally occur. If you have any problems or questions after discharge, call DR. Bahja Bence, [phone].  ACTIVITY  You may resume your regular activity, but move at a slower pace for the next 24 hours.   Take frequent rest periods for the next 24 hours.   Walking will help get rid of the air and reduce the bloated feeling in your belly (abdomen).   No driving for 24 hours (because of the medicine (anesthesia) used during the test).   You may shower.   Do not sign any important legal documents or operate any machinery for 24 hours (because of the anesthesia used during the test).    NUTRITION  Drink plenty of fluids.   You may resume your normal diet as instructed by your doctor.   Begin with a light meal and progress to your normal diet. Heavy or fried foods are harder to digest and may make you feel sick to your stomach (nauseated).   Avoid  alcoholic beverages for 24 hours or as instructed.    MEDICATIONS  You may resume your normal medications.   WHAT YOU CAN EXPECT TODAY  Some feelings of bloating in the abdomen.   Passage of more gas than usual.   Spotting of blood in your stool or on the toilet paper  .  IF YOU HAD POLYPS REMOVED DURING THE COLONOSCOPY:  Eat a soft diet IF YOU HAVE NAUSEA, BLOATING, ABDOMINAL PAIN, OR VOMITING.    FINDING OUT THE RESULTS OF YOUR TEST Not all test results are available during your visit. DR. Oneida Alar WILL CALL YOU WITHIN 14 DAYS OF YOUR PROCEDUE WITH YOUR RESULTS. Do not assume everything is normal if you have not heard from DR. Gerard Cantara, CALL HER OFFICE AT [phone].  SEEK IMMEDIATE MEDICAL ATTENTION AND CALL THE OFFICE: [phone] IF:  You have more than a spotting of blood in your stool.   Your belly is swollen (abdominal distention).   You are nauseated or vomiting.   You have a temperature over 101F.   You have abdominal pain or discomfort that is severe or gets worse throughout the day.  High-Fiber Diet A high-fiber diet changes your normal diet to include more whole grains, legumes, fruits, and vegetables. Changes in the diet involve replacing refined carbohydrates with unrefined foods. The calorie level of the diet is essentially unchanged. The Dietary Reference Intake (recommended amount) for adult males is 38 grams per day. For adult females, it is 25 grams per day. Pregnant  and lactating women should consume 28 grams of fiber per day. Fiber is the intact part of a plant that is not broken down during digestion. Functional fiber is fiber that has been isolated from the plant to provide a beneficial effect in the body.  PURPOSE  Increase stool bulk.   Ease and regulate bowel movements.   Lower cholesterol.   REDUCE RISK OF COLON CANCER  INDICATIONS THAT YOU NEED MORE FIBER  Constipation and hemorrhoids.   Uncomplicated diverticulosis (intestine  condition) and irritable bowel syndrome.   Weight management.   As a protective measure against hardening of the arteries (atherosclerosis), diabetes, and cancer.   GUIDELINES FOR INCREASING FIBER IN THE DIET  Start adding fiber to the diet slowly. A gradual increase of about 5 more grams (2 slices of whole-wheat bread, 2 servings of most fruits or vegetables, or 1 bowl of high-fiber cereal) per day is best. Too rapid an increase in fiber may result in constipation, flatulence, and bloating.   Drink enough water and fluids to keep your urine clear or pale yellow. Water, juice, or caffeine-free drinks are recommended. Not drinking enough fluid may cause constipation.   Eat a variety of high-fiber foods rather than one type of fiber.   Try to increase your intake of fiber through using high-fiber foods rather than fiber pills or supplements that contain small amounts of fiber.   The goal is to change the types of food eaten. Do not supplement your present diet with high-fiber foods, but replace foods in your present diet.   INCLUDE A VARIETY OF FIBER SOURCES  Replace refined and processed grains with whole grains, canned fruits with fresh fruits, and incorporate other fiber sources. White rice, white breads, and most bakery goods contain little or no fiber.   Brown whole-grain rice, buckwheat oats, and many fruits and vegetables are all good sources of fiber. These include: broccoli, Brussels sprouts, cabbage, cauliflower, beets, sweet potatoes, white potatoes (skin on), carrots, tomatoes, eggplant, squash, berries, fresh fruits, and dried fruits.   Cereals appear to be the richest source of fiber. Cereal fiber is found in whole grains and bran. Bran is the fiber-rich outer coat of cereal grain, which is largely removed in refining. In whole-grain cereals, the bran remains. In breakfast cereals, the largest amount of fiber is found in those with "bran" in their names. The fiber content is  sometimes indicated on the label.   You may need to include additional fruits and vegetables each day.   In baking, for 1 cup white flour, you may use the following substitutions:   1 cup whole-wheat flour minus 2 tablespoons.   1/2 cup white flour plus 1/2 cup whole-wheat flour.   Polyps, Colon  A polyp is extra tissue that grows inside your body. Colon polyps grow in the large intestine. The large intestine, also called the colon, is part of your digestive system. It is a long, hollow tube at the end of your digestive tract where your body makes and stores stool. Most polyps are not dangerous. They are benign. This means they are not cancerous. But over time, some types of polyps can turn into cancer. Polyps that are smaller than a pea are usually not harmful. But larger polyps could someday become or may already be cancerous. To be safe, doctors remove all polyps and test them.   PREVENTION There is not one sure way to prevent polyps. You might be able to lower your risk of getting them if  you:  Eat more fruits and vegetables and less fatty food.   Do not smoke.   Avoid alcohol.   Exercise every day.   Lose weight if you are overweight.   Eating more calcium and folate can also lower your risk of getting polyps. Some foods that are rich in calcium are milk, cheese, and broccoli. Some foods that are rich in folate are chickpeas, kidney beans, and spinach.    Diverticulosis Diverticulosis is a common condition that develops when small pouches (diverticula) form in the wall of the colon. The risk of diverticulosis increases with age. It happens more often in people who eat a low-fiber diet. Most individuals with diverticulosis have no symptoms. Those individuals with symptoms usually experience belly (abdominal) pain, constipation, or loose stools (diarrhea).  HOME CARE INSTRUCTIONS  Increase the amount of fiber in your diet as directed by your caregiver or dietician. This may  reduce symptoms of diverticulosis.   Drink at least 6 to 8 glasses of water each day to prevent constipation.   Try not to strain when you have a bowel movement.   Avoiding nuts and seeds to prevent complications is NOT NECESSARY.   FOODS HAVING HIGH FIBER CONTENT INCLUDE:  Fruits. Apple, peach, pear, tangerine, raisins, prunes.   Vegetables. Brussels sprouts, asparagus, broccoli, cabbage, carrot, cauliflower, romaine lettuce, spinach, summer squash, tomato, winter squash, zucchini.   Starchy Vegetables. Baked beans, kidney beans, lima beans, split peas, lentils, potatoes (with skin).   Grains. Whole wheat bread, brown rice, bran flake cereal, plain oatmeal, white rice, shredded wheat, bran muffins.   SEEK IMMEDIATE MEDICAL CARE IF:  You develop increasing pain or severe bloating.   You have an oral temperature above 101F.   You develop vomiting or bowel movements that are bloody or black.   Hemorrhoids Hemorrhoids are dilated (enlarged) veins around the rectum. Sometimes clots will form in the veins. This makes them swollen and painful. These are called thrombosed hemorrhoids. Causes of hemorrhoids include:  Constipation.   Straining to have a bowel movement.   HEAVY LIFTING   HOME CARE INSTRUCTIONS  Eat a well balanced diet and drink 6 to 8 glasses of water every day to avoid constipation. You may also use a bulk laxative.   Avoid straining to have bowel movements.   Keep anal area dry and clean.   Do not use a donut shaped pillow or sit on the toilet for long periods. This increases blood pooling and pain.   Move your bowels when your body has the urge; this will require less straining and will decrease pain and pressure.

## 2018-01-23 NOTE — Anesthesia Preprocedure Evaluation (Signed)
Anesthesia Evaluation  Patient identified by MRN, date of birth, ID band Patient awake    Reviewed: Allergy & Precautions, H&P , NPO status , Patient's Chart, lab work & pertinent test results, reviewed documented beta blocker date and time   Airway Mallampati: II  TM Distance: >3 FB Neck ROM: full    Dental no notable dental hx. (+) Upper Dentures, Lower Dentures   Pulmonary neg pulmonary ROS, former smoker,    Pulmonary exam normal breath sounds clear to auscultation       Cardiovascular Exercise Tolerance: Good hypertension, On Medications negative cardio ROS   Rhythm:regular Rate:Normal     Neuro/Psych Seizures -,  negative neurological ROS  negative psych ROS   GI/Hepatic negative GI ROS, Neg liver ROS, GERD  ,  Endo/Other  negative endocrine ROSMorbid obesity  Renal/GU negative Renal ROS  negative genitourinary   Musculoskeletal   Abdominal   Peds  Hematology negative hematology ROS (+) anemia ,   Anesthesia Other Findings 1 yr s/p scope with polypectomy for follow-up today "pre-diabetic" diet controlled no meds  AIRWAY NOTE  Grade II view, deep oropharynx Morbid obesity denies OSA/CPAP  Reproductive/Obstetrics negative OB ROS                             Anesthesia Physical Anesthesia Plan  ASA: III  Anesthesia Plan: MAC   Post-op Pain Management:    Induction:   PONV Risk Score and Plan:   Airway Management Planned:   Additional Equipment:   Intra-op Plan:   Post-operative Plan:   Informed Consent: I have reviewed the patients History and Physical, chart, labs and discussed the procedure including the risks, benefits and alternatives for the proposed anesthesia with the patient or authorized representative who has indicated his/her understanding and acceptance.   Dental Advisory Given  Plan Discussed with: CRNA and Anesthesiologist  Anesthesia Plan Comments:          Anesthesia Quick Evaluation

## 2018-01-23 NOTE — H&P (Signed)
Primary Care Physician:  Fayrene Helper, MD Primary Gastroenterologist:  Dr. Oneida Alar  Pre-Procedure History & Physical: HPI:  Sarah Coleman is a 67 y.o. female here for PERSONAL HISTORY OF POLYPS/FAMILY Hx COLON CA-FATHER HAD COLON CA AGE 34.  Past Medical History:  Diagnosis Date  . Allergic rhinitis   . Allergy   . Anemia   . Arthritis   . GERD (gastroesophageal reflux disease) 2013  . Hyperlipidemia   . Hypertension   . Metabolic syndrome X 7782  . Morbid obesity ([place]) 2000  . Obesity   . Prediabetes 2010  . Seizures ([place])    as child; unknown etiology.    Past Surgical History:  Procedure Laterality Date  . BIOPSY  04/11/2017   Procedure: BIOPSY;  Surgeon: Danie Binder, MD;  [facility];  Service: Endoscopy;;  gastric bx's  . COLONOSCOPY N/A 10/07/2016   Procedure: COLONOSCOPY;  Surgeon: Danie Binder, MD;  [facility];  Service: Endoscopy;  Laterality: N/A;  9:30 AM  . COLONOSCOPY WITH PROPOFOL N/A 11/01/2016   Procedure: COLONOSCOPY WITH PROPOFOL;  Surgeon: Danie Binder, MD;  [facility]: [facility];  Service: Endoscopy;  Laterality: N/A;  830   . ESOPHAGOGASTRODUODENOSCOPY (EGD) WITH PROPOFOL N/A 04/11/2017   Procedure: ESOPHAGOGASTRODUODENOSCOPY (EGD) WITH PROPOFOL;  Surgeon: Danie Binder, MD;  [facility];  Service: Endoscopy;  Laterality: N/A;  10:45am  . GERD    . PARTIAL HYSTERECTOMY  1987   secondary to ovarian cyst   . POLYPECTOMY  11/01/2016   Procedure: POLYPECTOMY;  Surgeon: Danie Binder, MD;  [facility];  Service: Endoscopy;;  colon  . SAVORY DILATION N/A 04/11/2017   Procedure: SAVORY DILATION;  Surgeon: Danie Binder, MD;  [facility]: [facility];  Service: Endoscopy;  Laterality: N/A;  . [place]    Prior to Admission medications   Medication Sig Start Date End Date Taking? Authorizing Provider  acetaminophen (TYLENOL) 500 MG tablet Take 1,000 mg by mouth 2 (two) times daily  as needed for moderate pain or headache.   Yes [provider]  aspirin EC 81 MG tablet Take 81 mg by mouth at bedtime.    Yes [provider]  azelastine (ASTELIN) 0.1 % nasal spray USE ONE SPRAY(S) IN EACH NOSTRIL TWICE DAILY AS DIRECTED Patient taking differently: USE TWO SPRAY(S) IN EACH NOSTRIL TWICE DAILY AS DIRECTED 04/18/17  Yes Fayrene Helper, MD  CALCIUM-MAGNESIUM-ZINC PO Take 1 tablet by mouth 2 (two) times daily.    Yes [provider]  carboxymethylcellulose (REFRESH PLUS) 0.5 % SOLN Place 1-2 drops into both eyes 3 (three) times daily as needed (for dry eyes).   Yes [provider]  C[place] 450 MG CAPS Take 450 mg by mouth daily as needed (constipation).   Yes [provider]  chlorthalidone (HYGROTON) 25 MG tablet TAKE 1 TABLET BY MOUTH ONCE DAILY ([place]) 01/09/18  Yes Branch, Alphonse Guild, MD  Cholecalciferol (VITAMIN D3) 5000 units CAPS Take 5,000 Units by mouth daily.   Yes [provider]  estradiol (ESTRACE) 0.1 MG/GM vaginal cream Place 1 Applicatorful vaginally every other day as needed (for vaginal discomfort). 12/28/16  Yes Fayrene Helper, MD  fluticasone (FLONASE) 50 MCG/ACT nasal spray USE 2 SPRAY(S) IN EACH NOSTRIL ONCE DAILY Patient taking differently: USE 2 SPRAY(S) IN EACH NOSTRIL ONCE DAILY AT NIGHT 11/10/17  Yes Fayrene Helper, MD  Iron-Vitamins (GERITOL PO)  Take 1 tablet by mouth daily.   Yes [provider]  loratadine (CLARITIN) 10 MG tablet Take 10 mg by mouth daily.   Yes [provider]  lovastatin (MEVACOR) 20 MG tablet Take 1 tablet (20 mg total) at bedtime by mouth. 06/22/17  Yes Fayrene Helper, MD  meclizine (ANTIVERT) 12.5 MG tablet TAKE ONE TABLET BY MOUTH THREE TIMES DAILY AS NEEDED FOR DIZZINESS 06/24/16  Yes Fayrene Helper, MD  Omega-3 300 MG CAPS Take 1,200 mg by mouth daily.   Yes [provider]  pantoprazole (PROTONIX) 40 MG tablet 1  PO 30 MINUTES PRIOR TO MEALS BID FOR 3 MOS THEN QD Patient taking differently: Take 40 mg by mouth daily before breakfast.  04/11/17  Yes Ignatz Deis L, MD  polyethylene glycol-electrolytes (TRILYTE) 420 g solution Take 4,000 mLs by mouth as directed. 11/28/17  Yes Miran Kautzman, Marga Melnick, MD  potassium chloride SA (K-DUR,KLOR-CON) 20 MEQ tablet Two tablets in the morning , one tablet at lunch, and two tablets in the evening Patient taking differently: Take 20-40 mEq by mouth See admin instructions. Two tablets in the morning , one tablet at lunch, and two tablets in the evening 10/25/17  Yes Fayrene Helper, MD  pseudoephedrine (SUDAFED) 30 MG tablet Take 60 mg by mouth every 6 (six) hours as needed for congestion.   Yes [provider]  ranitidine (ZANTAC) 300 MG tablet TAKE 1 TABLET BY MOUTH AT BEDTIME. IF NEEDED TO  CONTROL REFLUX/HEARTBURN. 11/16/17  Yes Mahala Menghini, PA-C  spironolactone (ALDACTONE) 25 MG tablet TAKE 1/2 (ONE-HALF) TABLET BY MOUTH ONCE DAILY 01/09/18  Yes Branch, Alphonse Guild, MD  benzonatate (TESSALON) 100 MG capsule Take 1 capsule (100 mg total) by mouth 2 (two) times daily as needed for cough. Patient not taking: Reported on 01/10/2018 08/18/17   Raylene Everts, MD  cyclobenzaprine (FLEXERIL) 10 MG tablet TAKE 1 TABLET BY MOUTH AT BEDTIME Patient not taking: Reported on 01/10/2018 06/12/17   Fayrene Helper, MD  EQ ALLERGY RELIEF 10 MG tablet TAKE 1 TABLET BY MOUTH ONCE DAILY Patient not taking: Reported on 01/10/2018 11/07/17   Fayrene Helper, MD  nitroGLYCERIN (NITROSTAT) 0.4 MG SL tablet Place 1 tablet (0.4 mg total) under the tongue every 5 (five) minutes as needed for chest pain (hold for SBP < 110). 02/09/17   Janece Canterbury, MD    Allergies as of 11/28/2017 - Review Complete 11/28/2017  Allergen Reaction Noted  . Cinnamon Swelling 10/24/2016  . Ibuprofen Swelling 12/06/2007  . Penicillins Nausea And Vomiting 12/06/2007  . Sweet potato Swelling  10/24/2016    Family History  Problem Relation Age of Onset  . Heart attack Mother   . Heart disease Mother 22       massive heart attack  . Prostate cancer Father   . Colon cancer Father 5  . Hypertension Sister   . Ovarian cancer Sister   . Cancer Brother 33       oral   . Hypertension Brother   . Heart disease Brother     Social History   Socioeconomic History  . Marital status: Married    Spouse name: Not on file  . Number of children: 5  . Years of education: Not on file  . Highest education level: Not on file  Occupational History  . Occupation: retired   Scientific laboratory technician  . Financial resource strain: Not very hard  . Food insecurity:    Worry: Never true  Inability: Never true  . Transportation needs:    Medical: No    Non-medical: No  Tobacco Use  . Smoking status: Former Smoker    Packs/day: 1.00    Years: 3.00    Pack years: 3.00    Last attempt to quit: 08/15/1974    Years since quitting: 43.4  . Smokeless tobacco: Never Used  Substance and Sexual Activity  . Alcohol use: No    Alcohol/week: 0.0 oz  . Drug use: No  . Sexual activity: Yes    Birth control/protection: Surgical  Lifestyle  . Physical activity:    Days per week: 0 days    Minutes per session: 0 min  . Stress: Not at all  Relationships  . Social connections:    Talks on phone: More than three times a week    Gets together: Never    Attends religious service: More than 4 times per year    Active member of club or organization: No    Attends meetings of clubs or organizations: Never    Relationship status: Married  . Intimate partner violence:    Fear of current or ex partner: No    Emotionally abused: No    Physically abused: No    Forced sexual activity: No  Other Topics Concern  . Not on file  Social History Narrative  . Not on file    Review of Systems: See HPI, otherwise negative ROS   Physical Exam: BP 126/89   Temp 98.1 F (36.7 C) (Oral)   Resp 18   SpO2 97%   General:   Alert,  pleasant and cooperative in NAD Head:  Normocephalic and atraumatic. Neck:  Supple; Lungs:  Clear throughout to auscultation.    Heart:  Regular rate and rhythm. Abdomen:  Soft, nontender and nondistended. Normal bowel sounds, without guarding, and without rebound.   Neurologic:  Alert and  oriented x4;  grossly normal neurologically.  Impression/Plan:     PERSONAL HISTORY OF POLYPS/FAMILY Hx COLON CA-FATHER HAD COLON CA AGE 60.  PLAN: 1. TCS TODAY. DISCUSSED PROCEDURE, BENEFITS, & RISKS: < 1% chance of medication reaction, bleeding, perforation, or rupture of spleen/liver.

## 2018-01-24 ENCOUNTER — Other Ambulatory Visit: Payer: Self-pay | Admitting: Family Medicine

## 2018-01-27 ENCOUNTER — Telehealth: Payer: Self-pay | Admitting: Gastroenterology

## 2018-01-27 NOTE — Telephone Encounter (Signed)
THREE SIMPLE ADENOMAS REMOVED.    DRINK WATER TO KEEP YOUR URINE LIGHT YELLOW.  CONTINUE YOUR WEIGHT LOSS EFFORTS.  FOLLOW A HIGH FIBER DIET. AVOID ITEMS THAT CAUSE BLOATING.   USE PREPARATION H FOUR TIMES  A DAY IF NEEDED TO RELIEVE RECTAL PAIN/PRESSURE/BLEEDING.  Next colonoscopy in 3 years. YOUR SISTERS, BROTHERS, CHILDREN, AND PARENTS NEED TO HAVE A COLONOSCOPY STARTING AT THE AGE OF 40.

## 2018-01-29 NOTE — Telephone Encounter (Signed)
Pt notified of results. Next TCS 3 years.

## 2018-01-29 NOTE — Telephone Encounter (Signed)
ON RECALL  °

## 2018-01-30 ENCOUNTER — Ambulatory Visit: Payer: PPO | Admitting: Cardiology

## 2018-01-30 ENCOUNTER — Encounter (HOSPITAL_COMMUNITY): Payer: Self-pay | Admitting: Gastroenterology

## 2018-01-30 VITALS — BP 113/71 | HR 79 | Ht 66.0 in | Wt 224.6 lb

## 2018-01-30 DIAGNOSIS — R079 Chest pain, unspecified: Secondary | ICD-10-CM | POA: Diagnosis not present

## 2018-01-30 DIAGNOSIS — E782 Mixed hyperlipidemia: Secondary | ICD-10-CM

## 2018-01-30 DIAGNOSIS — I10 Essential (primary) hypertension: Secondary | ICD-10-CM | POA: Diagnosis not present

## 2018-01-30 NOTE — Patient Instructions (Signed)
Medication Instructions:  Your physician recommends that you continue on your current medications as directed. Please refer to the Current Medication list given to you today.  Labwork: NONE  Testing/Procedures: NONE  Follow-Up: Your physician wants you to follow-up in: 1 YEAR WITH DR. BRANCH. You will receive a reminder letter in the mail two months in advance. If you don't receive a letter, please call our office to schedule the follow-up appointment.  Any Other Special Instructions Will Be Listed Below (If Applicable).  If you need a refill on your cardiac medications before your next appointment, please call your pharmacy. 

## 2018-01-30 NOTE — Progress Notes (Signed)
Clinical Summary Ms. Brotzman is a 67 y.o.female seen today for follow up of the following medical problems.   1. HTN - compliant with meds    2. Hyperlipidemia - primary issue has been elevated TGs. She has been working on dietary changes - compliant with statin. Has been on fish oil - 10/2017 TC 186 HDL 42 TG 300 LDL 104     3. Chest pain - admit 02/09/17 with chest pain - described as atypical, associated with food. No evidence of ACS by enzymes or EKG. DIscharged withoutpatient stress arranged - 02/20/17 stress small mild intensity reversible apical defect, likely artifact cannot excluded mild ischemia. Overall low risk 01/2017 echo LVEF 60-65%, no WMAs  - denies any chest pain. Has done very well since starting antacid.    Past Medical History:  Diagnosis Date  . Allergic rhinitis   . Allergy   . Anemia   . Arthritis   . GERD (gastroesophageal reflux disease) 2013  . Hyperlipidemia   . Hypertension   . Metabolic syndrome X 7106  . Morbid obesity ([place]) 2000  . Obesity   . Prediabetes 2010  . Seizures ([place])    as child; unknown etiology.     Allergies  Allergen Reactions  . Cinnamon Swelling  . Ibuprofen Swelling  . Penicillins Nausea And Vomiting    Has patient had a PCN reaction causing immediate rash, facial/tongue/throat swelling, SOB or lightheadedness with hypotension:No Has patient had a PCN reaction causing severe rash involving mucus membranes or skin necrosis:No Has patient had a PCN reaction that required hospitalization:No Has patient had a PCN reaction occurring within the last 10 years:No If all of the above answers are "NO", then may proceed with Cephalosporin use.   Marland Kitchen Sweet Potato Swelling     Current Outpatient Medications  Medication Sig Dispense Refill  . acetaminophen (TYLENOL) 500 MG tablet Take 1,000 mg by mouth 2 (two) times daily as needed for moderate pain or headache.    Marland Kitchen aspirin EC 81 MG tablet Take 81 mg by mouth at  bedtime.     Marland Kitchen azelastine (ASTELIN) 0.1 % nasal spray USE ONE SPRAY(S) IN EACH NOSTRIL TWICE DAILY AS DIRECTED (Patient taking differently: USE TWO SPRAY(S) IN EACH NOSTRIL TWICE DAILY AS DIRECTED) 30 mL 12  . CALCIUM-MAGNESIUM-ZINC PO Take 1 tablet by mouth 2 (two) times daily.     . carboxymethylcellulose (REFRESH PLUS) 0.5 % SOLN Place 1-2 drops into both eyes 3 (three) times daily as needed (for dry eyes).    Rolena Infante [place] 450 MG CAPS Take 450 mg by mouth daily as needed (constipation).    . chlorthalidone (HYGROTON) 25 MG tablet TAKE 1 TABLET BY MOUTH ONCE DAILY (STOPPING  MAXZIDE) 90 tablet 0  . Cholecalciferol (VITAMIN D3) 5000 units CAPS Take 5,000 Units by mouth daily.    Marland Kitchen estradiol (ESTRACE) 0.1 MG/GM vaginal cream PLACE 1 APPLICATORFUL VAGINALLY EVERY OTHER DAY AS NEEDED (FOR VAGINAL DISCOMFORT) 43 g 3  . fluticasone (FLONASE) 50 MCG/ACT nasal spray USE 2 SPRAY(S) IN EACH NOSTRIL ONCE DAILY (Patient taking differently: USE 2 SPRAY(S) IN EACH NOSTRIL ONCE DAILY AT NIGHT) 16 g 3  . Iron-Vitamins (GERITOL PO) Take 1 tablet by mouth daily.    Marland Kitchen loratadine (CLARITIN) 10 MG tablet Take 10 mg by mouth daily.    Marland Kitchen lovastatin (MEVACOR) 20 MG tablet Take 1 tablet (20 mg total) at bedtime by mouth. 90 tablet 1  . meclizine (ANTIVERT) 12.5 MG tablet TAKE  ONE TABLET BY MOUTH THREE TIMES DAILY AS NEEDED FOR DIZZINESS 90 tablet 2  . nitroGLYCERIN (NITROSTAT) 0.4 MG SL tablet Place 1 tablet (0.4 mg total) under the tongue every 5 (five) minutes as needed for chest pain (hold for SBP < 110). 30 tablet 0  . Omega-3 300 MG CAPS Take 1,200 mg by mouth daily.    . pantoprazole (PROTONIX) 40 MG tablet 1 PO 30 MINUTES PRIOR TO MEALS BID FOR 3 MOS THEN QD (Patient taking differently: Take 40 mg by mouth daily before breakfast. ) 60 tablet 11  . polyethylene glycol-electrolytes (TRILYTE) 420 g solution Take 4,000 mLs by mouth as directed. 4000 mL 0  . potassium chloride SA (K-DUR,KLOR-CON) 20 MEQ tablet Two  tablets in the morning , one tablet at lunch, and two tablets in the evening (Patient taking differently: Take 20-40 mEq by mouth See admin instructions. Two tablets in the morning , one tablet at lunch, and two tablets in the evening) 150 tablet 5  . pseudoephedrine (SUDAFED) 30 MG tablet Take 60 mg by mouth every 6 (six) hours as needed for congestion.    . ranitidine (ZANTAC) 300 MG tablet TAKE 1 TABLET BY MOUTH AT BEDTIME. IF NEEDED TO  CONTROL REFLUX/HEARTBURN. 30 tablet 5  . spironolactone (ALDACTONE) 25 MG tablet TAKE 1/2 (ONE-HALF) TABLET BY MOUTH ONCE DAILY 45 tablet 0   No current facility-administered medications for this visit.      Past Surgical History:  Procedure Laterality Date  . BIOPSY  04/11/2017   Procedure: BIOPSY;  Surgeon: Danie Binder, MD;  [facility];  Service: Endoscopy;;  gastric bx's  . COLONOSCOPY N/A 10/07/2016   Procedure: COLONOSCOPY;  Surgeon: Danie Binder, MD;  [facility]: [facility];  Service: Endoscopy;  Laterality: N/A;  9:30 AM  . COLONOSCOPY WITH PROPOFOL N/A 11/01/2016   Procedure: COLONOSCOPY WITH PROPOFOL;  Surgeon: Danie Binder, MD;  Location: [facility];  Service: Endoscopy;  Laterality: N/A;  830   . COLONOSCOPY WITH PROPOFOL N/A 01/23/2018   Procedure: COLONOSCOPY WITH PROPOFOL;  Surgeon: Danie Binder, MD;  Location: [facility];  Service: Endoscopy;  Laterality: N/A;  2:00pm - pt knows to arrive at 10:15  . ESOPHAGOGASTRODUODENOSCOPY (EGD) WITH PROPOFOL N/A 04/11/2017   Procedure: ESOPHAGOGASTRODUODENOSCOPY (EGD) WITH PROPOFOL;  Surgeon: Danie Binder, MD;  [facility]: [facility];  Service: Endoscopy;  Laterality: N/A;  10:45am  . GERD    . PARTIAL HYSTERECTOMY  1987   secondary to ovarian cyst   . POLYPECTOMY  11/01/2016   Procedure: POLYPECTOMY;  Surgeon: Danie Binder, MD;  [facility]: [facility];  Service: Endoscopy;;  colon  . POLYPECTOMY  01/23/2018   Procedure: POLYPECTOMY;  Surgeon: Danie Binder, MD;   [facility];  Service: Endoscopy;;  ascending colon  . SAVORY DILATION N/A 04/11/2017   Procedure: SAVORY DILATION;  Surgeon: Danie Binder, MD;  Location: [facility];  Service: Endoscopy;  Laterality: N/A;  . TUBAL LIGATION  1975     Allergies  Allergen Reactions  . Cinnamon Swelling  . Ibuprofen Swelling  . Penicillins Nausea And Vomiting    Has patient had a PCN reaction causing immediate rash, facial/tongue/throat swelling, SOB or lightheadedness with hypotension:No Has patient had a PCN reaction causing severe rash involving mucus membranes or skin necrosis:No Has patient had a PCN reaction that required hospitalization:No Has patient had a PCN reaction occurring within the last 10 years:No If all of  the above answers are "NO", then may proceed with Cephalosporin use.   Marland Kitchen Sweet Potato Swelling      Family History  Problem Relation Age of Onset  . Heart attack Mother   . Heart disease Mother 47       massive heart attack  . Prostate cancer Father   . Colon cancer Father 47  . Hypertension Sister   . Ovarian cancer Sister   . Cancer Brother 42       oral   . Hypertension Brother   . Heart disease Brother      Social History Ms. Robards reports that she quit smoking about 43 years ago. She has a 3.00 pack-year smoking history. She has never used smokeless tobacco. Ms. Nason reports that she does not drink alcohol.   Review of Systems CONSTITUTIONAL: No weight loss, fever, chills, weakness or fatigue.  HEENT: Eyes: No visual loss, blurred vision, double vision or yellow sclerae.No hearing loss, sneezing, congestion, runny nose or sore throat.  SKIN: No rash or itching.  CARDIOVASCULAR: per hpi RESPIRATORY: No shortness of breath, cough or sputum.  GASTROINTESTINAL: No anorexia, nausea, vomiting or diarrhea. No abdominal pain or blood.  GENITOURINARY: No burning on urination, no polyuria NEUROLOGICAL: No headache, dizziness, syncope, paralysis, ataxia,  numbness or tingling in the extremities. No change in bowel or bladder control.  MUSCULOSKELETAL: No muscle, back pain, joint pain or stiffness.  LYMPHATICS: No enlarged nodes. No history of splenectomy.  PSYCHIATRIC: No history of depression or anxiety.  ENDOCRINOLOGIC: No reports of sweating, cold or heat intolerance. No polyuria or polydipsia.  Marland Kitchen   Physical Examination Vitals:   01/30/18 1313  BP: 113/71  Pulse: 79  SpO2: 98%   Vitals:   01/30/18 1313  Weight: 224 lb 9.6 oz (101.9 kg)  Height: 5\' 6"  (1.676 m)    Gen: resting comfortably, no acute distress HEENT: no scleral icterus, pupils equal round and reactive, no palptable cervical adenopathy,  CV: RRR, no m/r/g, no jvd Resp: Clear to auscultation bilaterally GI: abdomen is soft, non-tender, non-distended, normal bowel sounds, no hepatosplenomegaly MSK: extremities are warm, no edema.  Skin: warm, no rash Neuro:  no focal deficits Psych: appropriate affect   Diagnostic Studies 01/2015 echo Study Conclusions  - Left ventricle: The cavity size was normal. Wall thickness was  normal. Systolic function was normal. The estimated ejection  fraction was in the range of 60% to 65%. Wall motion was normal;  there were no regional wall motion abnormalities. Doppler  parameters are consistent with abnormal left ventricular  relaxation (grade 1 diastolic dysfunction). - Mitral valve: There was trivial regurgitation. - Right atrium: Central venous pressure (est): 3 mm Hg. - Atrial septum: A patent foramen ovale cannot be excluded based on  available images. - Tricuspid valve: There was trivial regurgitation. - Pericardium, extracardiac: There was no pericardial effusion.  Impressions:  - Normal LV wall thickness with LVEF 47-42%, grade 1 diastolic  dysfunction. Trivial mitral and tricuspid regurgitation. Cannot  exclude PFO based on available images. Unable to assess PASP.  01/2017 echo Study  Conclusions  - Left ventricle: The cavity size was normal. Systolic function was normal. The estimated ejection fraction was in the range of 60% to 65%. Wall motion was normal; there were no regional wall motion abnormalities. Left ventricular diastolic function parameters were normal. - Atrial septum: No obvious PFO bubble study not done. Mid septum mobile   02/2017 Nuclear stress  No diagnostic ST segment changes to indicate  ischemia. Low risk Duke treadmill score of 4.5.  Blood pressure demonstrated a hypertensive response to exercise.  Small, mild intensity, reversible apical defect, possibly related to variable breast attenuation artifact, less likely a small ischemic territory.  This is a low risk study.  Nuclear stress EF: 63%.      Assessment and Plan   1. HTN - she is at goal, continue current meds   2. Hyperlipidemia - continue statin, continue dietary modication for elevated TGs.   3. Chest pain - noncardiac, prior stress test overall low risk. Probable GI cause,, no recurrent symptoms since starting antacid - continue to monitor at this time.    F/u 1 year   [name], M.D.

## 2018-02-04 ENCOUNTER — Encounter: Payer: Self-pay | Admitting: Cardiology

## 2018-02-06 ENCOUNTER — Telehealth: Payer: Self-pay | Admitting: Family Medicine

## 2018-02-06 NOTE — Telephone Encounter (Signed)
Patient left voicemail requesting a call back to phone # [phone]. No answer/LVM requesting a call back because she left no details as to what she needed.

## 2018-02-08 ENCOUNTER — Telehealth: Payer: Self-pay | Admitting: Family Medicine

## 2018-02-08 NOTE — Telephone Encounter (Signed)
This was sent in on 01/24/18 to [place]

## 2018-02-08 NOTE — Telephone Encounter (Signed)
Patient is requesting refill of estrogen cream

## 2018-02-09 ENCOUNTER — Encounter: Payer: Self-pay | Admitting: Nurse Practitioner

## 2018-02-09 ENCOUNTER — Other Ambulatory Visit: Payer: Self-pay | Admitting: Family Medicine

## 2018-02-09 NOTE — Telephone Encounter (Signed)
Patient aware.

## 2018-02-21 ENCOUNTER — Encounter: Payer: Self-pay | Admitting: Family Medicine

## 2018-02-21 ENCOUNTER — Ambulatory Visit (INDEPENDENT_AMBULATORY_CARE_PROVIDER_SITE_OTHER): Payer: PPO | Admitting: Family Medicine

## 2018-02-21 VITALS — BP 136/80 | HR 72 | Resp 16 | Ht 66.0 in | Wt 221.0 lb

## 2018-02-21 DIAGNOSIS — I10 Essential (primary) hypertension: Secondary | ICD-10-CM

## 2018-02-21 DIAGNOSIS — J309 Allergic rhinitis, unspecified: Secondary | ICD-10-CM

## 2018-02-21 DIAGNOSIS — E785 Hyperlipidemia, unspecified: Secondary | ICD-10-CM | POA: Diagnosis not present

## 2018-02-21 DIAGNOSIS — J0100 Acute maxillary sinusitis, unspecified: Secondary | ICD-10-CM | POA: Diagnosis not present

## 2018-02-21 DIAGNOSIS — E559 Vitamin D deficiency, unspecified: Secondary | ICD-10-CM | POA: Diagnosis not present

## 2018-02-21 MED ORDER — AZITHROMYCIN 250 MG PO TABS: ORAL_TABLET | ORAL | 0 refills | Status: DC

## 2018-02-21 MED ORDER — FLUCONAZOLE 150 MG PO TABS: 150.0000 mg | ORAL_TABLET | Freq: Once | ORAL | 0 refills | Status: AC

## 2018-02-21 MED ORDER — PREDNISONE 5 MG PO TABS: ORAL_TABLET | ORAL | 0 refills | Status: DC

## 2018-02-21 NOTE — Progress Notes (Signed)
   Sarah Coleman     MRN: [id number]      DOB: 25-Jul-1951   HPI Sarah Coleman is here with a  5 day h/o frontal headache and post nasal drainage , with thick post nasal drainage and left  maxillary pressure.c/o ear pressure on left and tender left neck glands, she also has had intermittent chills. She has increased cough at night , but mainly as phlegm is draining down, she denies significant chest congestion ROS  Denies chest pains, palpitations and leg swelling Denies abdominal pain, nausea, vomiting,diarrhea or constipation.   Denies dysuria, frequency, hesitancy or incontinence. Denies joint pain, swelling and limitation in mobility. Denies  seizures, numbness, or tingling. Denies depression, anxiety or insomnia. Denies skin break down or rash.   PE  BP 136/80   Pulse 72   Resp 16   Ht 5\' 6"  (1.676 m)   Wt 221 lb (100.2 kg)   SpO2 97%   BMI 35.67 kg/m   Patient alert and oriented and in no cardiopulmonary distress.  HEENT: No facial asymmetry, EOMI,   oropharynx pink and moist.  Neck supple no JVD, no mass.Left Maxillary sinus tenderness, TM clear left anterior cervical adenitis  Chest: Clear to auscultation bilaterally.  CVS: S1, S2 no murmurs, no S3.Regular rate.  ABD: Soft non tender.   Ext: No edema  MS: Adequate ROM spine, shoulders, hips and knees.  Skin: Intact, no ulcerations or rash noted.  Psych: Good eye contact, normal affect. Memory intact not anxious or depressed appearing.  CNS: CN 2-12 intact, power,  normal throughout.no focal deficits noted.   Assessment & Plan  Maxillary sinusitis, acute Acute infection with left ant adenitis, Z pack prescribed, and fluconazole for yeast infection following antibiotic course, pt to use saline nasal flushes also  Essential hypertension Controlled, no change in medication DASH diet and commitment to daily physical activity for a minimum of 30 minutes discussed and encouraged, as a part of hypertension  management. The importance of attaining a healthy weight is also discussed.  BP/Weight 02/21/2018 01/30/2018 01/23/2018 01/16/2018 11/28/2017 11/27/2017 6/73/4193  Systolic BP 790 240 973 532 992 426 834  Diastolic BP 80 71 78 95 83 80 82  Wt. (Lbs) 221 224.6 - 218 220.6 221 220  BMI 35.67 36.25 - 35.19 35.61 35.67 35.51       Morbid obesity ([facility]) Improved, she is applauded on this. Patient re-educated about  the importance of commitment to a  minimum of 150 minutes of exercise per week.  The importance of healthy food choices with portion control discussed. Encouraged to start a food diary, count calories and to consider  joining a support group. Sample diet sheets offered. Goals set by the patient for the next several months.   Weight /BMI 02/21/2018 01/30/2018 01/16/2018  WEIGHT 221 lb 224 lb 9.6 oz 218 lb  HEIGHT 5\' 6"  5\' 6"  5\' 6"   BMI 35.67 kg/m2 36.25 kg/m2 35.19 kg/m2      Allergic rhinitis Increased symptoms, encouraged to use her medication daily for better control

## 2018-02-21 NOTE — Patient Instructions (Addendum)
Annual physical exam with mD end [place], call if you need me before  Plkease get fasting lipid, cmp and EGFr and Vit D 1  Week before visit  Stop supplements as we discussed  Start calcium 1200 mg with vit D 1000 IU one daily for bone health  You are treated for acute sinusitis, do not take the cholesterol medication on the day you take medication for yeast  It is important that you exercise regularly at least 30 minutes 5 times a week. If you develop chest pain, have severe difficulty breathing, or feel very tired, stop exercising immediately and seek medical attention

## 2018-02-21 NOTE — Assessment & Plan Note (Addendum)
Acute infection with left ant adenitis, Z pack prescribed, and fluconazole for yeast infection following antibiotic course, pt to use saline nasal flushes also

## 2018-02-22 ENCOUNTER — Other Ambulatory Visit: Payer: Self-pay

## 2018-02-22 ENCOUNTER — Telehealth: Payer: Self-pay | Admitting: Family Medicine

## 2018-02-22 MED ORDER — POTASSIUM CHLORIDE CRYS ER 20 MEQ PO TBCR: EXTENDED_RELEASE_TABLET | ORAL | 5 refills | Status: DC

## 2018-02-22 NOTE — Telephone Encounter (Signed)
Refill done.  

## 2018-02-22 NOTE — Telephone Encounter (Signed)
Patient left voicemail stating Dr.Simpson forgot to prescribe her potassium.  Cb# [id number]/ [id number]

## 2018-02-23 NOTE — Telephone Encounter (Signed)
Patient states Dr.Simpson said she was going to add 1 more potassium pill into her daily routine but the bottle says otherwise. Please advise.

## 2018-02-25 ENCOUNTER — Encounter: Payer: Self-pay | Admitting: Family Medicine

## 2018-02-25 ENCOUNTER — Other Ambulatory Visit: Payer: Self-pay | Admitting: Family Medicine

## 2018-02-25 MED ORDER — POTASSIUM CHLORIDE CRYS ER 20 MEQ PO TBCR: EXTENDED_RELEASE_TABLET | ORAL | 5 refills | Status: DC

## 2018-02-25 NOTE — Telephone Encounter (Signed)
New adjusted prescription is sent in and patient is aware

## 2018-02-25 NOTE — Assessment & Plan Note (Signed)
Controlled, no change in medication DASH diet and commitment to daily physical activity for a minimum of 30 minutes discussed and encouraged, as a part of hypertension management. The importance of attaining a healthy weight is also discussed.  BP/Weight 02/21/2018 01/30/2018 01/23/2018 01/16/2018 11/28/2017 11/27/2017 3/34/3568  Systolic BP 616 837 290 211 155 208 022  Diastolic BP 80 71 78 95 83 80 82  Wt. (Lbs) 221 224.6 - 218 220.6 221 220  BMI 35.67 36.25 - 35.19 35.61 35.67 35.51

## 2018-02-25 NOTE — Assessment & Plan Note (Signed)
Increased symptoms, encouraged to use her medication daily for better control

## 2018-02-25 NOTE — Assessment & Plan Note (Signed)
Improved, she is applauded on this. Patient re-educated about  the importance of commitment to a  minimum of 150 minutes of exercise per week.  The importance of healthy food choices with portion control discussed. Encouraged to start a food diary, count calories and to consider  joining a support group. Sample diet sheets offered. Goals set by the patient for the next several months.   Weight /BMI 02/21/2018 01/30/2018 01/16/2018  WEIGHT 221 lb 224 lb 9.6 oz 218 lb  HEIGHT 5\' 6"  5\' 6"  5\' 6"   BMI 35.67 kg/m2 36.25 kg/m2 35.19 kg/m2

## 2018-03-13 ENCOUNTER — Other Ambulatory Visit: Payer: Self-pay | Admitting: Family Medicine

## 2018-03-26 ENCOUNTER — Other Ambulatory Visit: Payer: Self-pay | Admitting: Family Medicine

## 2018-03-26 ENCOUNTER — Other Ambulatory Visit: Payer: Self-pay

## 2018-03-26 ENCOUNTER — Telehealth: Payer: Self-pay | Admitting: Family Medicine

## 2018-03-26 MED ORDER — POTASSIUM CHLORIDE CRYS ER 20 MEQ PO TBCR: EXTENDED_RELEASE_TABLET | ORAL | 5 refills | Status: DC

## 2018-03-26 MED ORDER — POTASSIUM CHLORIDE CRYS ER 20 MEQ PO TBCR
EXTENDED_RELEASE_TABLET | ORAL | 5 refills | Status: DC
Start: 2018-03-26 — End: 2019-04-24

## 2018-03-26 NOTE — Telephone Encounter (Signed)
Patient called in stating that her prednisone was suppose to be changed. She is taking 2 in the morning, 1 lunch, and 2 in the evening. Cb#: [phone]

## 2018-03-26 NOTE — Telephone Encounter (Signed)
Was referring to her potassium. Sent in the correct dose again with message to the pharmacy to d/c the old dose

## 2018-04-06 ENCOUNTER — Other Ambulatory Visit: Payer: Self-pay | Admitting: Cardiology

## 2018-04-12 ENCOUNTER — Encounter: Payer: PPO | Admitting: Family Medicine

## 2018-04-16 ENCOUNTER — Other Ambulatory Visit: Payer: Self-pay | Admitting: Cardiology

## 2018-04-18 ENCOUNTER — Other Ambulatory Visit: Payer: Self-pay

## 2018-04-18 ENCOUNTER — Encounter: Payer: Self-pay | Admitting: Family Medicine

## 2018-04-18 ENCOUNTER — Other Ambulatory Visit (HOSPITAL_COMMUNITY)
Admission: RE | Admit: 2018-04-18 | Discharge: 2018-04-18 | Disposition: A | Discharge status: Home / Self Care | Payer: PPO | Source: Ambulatory Visit | Attending: Family Medicine | Admitting: Family Medicine

## 2018-04-18 ENCOUNTER — Ambulatory Visit (INDEPENDENT_AMBULATORY_CARE_PROVIDER_SITE_OTHER): Payer: PPO | Admitting: Family Medicine

## 2018-04-18 VITALS — BP 130/76 | HR 89 | Temp 98.2°F | Resp 12 | Ht 66.0 in | Wt 221.0 lb

## 2018-04-18 DIAGNOSIS — N3001 Acute cystitis with hematuria: Secondary | ICD-10-CM

## 2018-04-18 DIAGNOSIS — R300 Dysuria: Secondary | ICD-10-CM | POA: Diagnosis not present

## 2018-04-18 DIAGNOSIS — N760 Acute vaginitis: Secondary | ICD-10-CM | POA: Diagnosis not present

## 2018-04-18 LAB — POCT URINALYSIS DIPSTICK
Bilirubin, UA: NEGATIVE
GLUCOSE UA: NEGATIVE
Ketones, UA: NEGATIVE
LEUKOCYTES UA: NEGATIVE
Nitrite, UA: NEGATIVE
Protein, UA: NEGATIVE
Spec Grav, UA: 1.02 (ref 1.010–1.025)
Urobilinogen, UA: 0.2 E.U./dL
pH, UA: 7 (ref 5.0–8.0)

## 2018-04-18 MED ORDER — FLUCONAZOLE 150 MG PO TABS: ORAL_TABLET | ORAL | 0 refills | Status: DC

## 2018-04-18 MED ORDER — SULFAMETHOXAZOLE-TRIMETHOPRIM 400-80 MG PO TABS: 1.0000 | ORAL_TABLET | Freq: Two times a day (BID) | ORAL | 0 refills | Status: DC

## 2018-04-18 NOTE — Progress Notes (Signed)
Patient ID: Sarah Coleman, female    DOB: [birth year], 67 y.o.   MRN: [id number]  Chief Complaint  Patient presents with  . Urinary Tract Infection    burning with urination    Allergies Cinnamon; Ibuprofen; Penicillins; and Sweet potato  Subjective:   Sarah Coleman is a 67 y.o. female who presents to [facility] today.  HPI Patient presents today for an acute visit secondary to dysuria.  She reports that she started burning with urination 2 weeks ago, has been using azostandard OTC.  Her symptoms did not improve and so she felt like she should come in and get evaluated.  She reports that she has been urinating more frequently and does not feel like she is completely emptying her bladder.  She reports it burns with urination.  Has not seen any gross hematuria. She reports that she feels some pressure in her lower abdomen.  Her bowel movements have been normal.  She denies any vomiting or diarrhea.  She has had some mild nausea.  She is able to eat and drink well.  She denies any vaginal discharge.  She reports that she would like to have her urine checked for any sexually transmitted diseases because her husband has cheated on her twice in the past.  She reports she was recently checked for sexually transmitted infections and her blood and does not want to have those done today.    Past Medical History:  Diagnosis Date  . Allergic rhinitis   . Allergy   . Anemia   . Arthritis   . GERD (gastroesophageal reflux disease) 2013  . Hyperlipidemia   . Hypertension   . Metabolic syndrome X 3762  . Morbid obesity ([place]) 2000  . Obesity   . Prediabetes 2010  . Seizures ([place])    as child; unknown etiology.    Past Surgical History:  Procedure Laterality Date  . BIOPSY  04/11/2017   Procedure: BIOPSY;  Surgeon: Danie Binder, MD;  [facility];  Service: Endoscopy;;  gastric bx's  . COLONOSCOPY N/A 10/07/2016   Procedure: COLONOSCOPY;  Surgeon: Danie Binder, MD;  [facility];  Service: Endoscopy;  Laterality: N/A;  9:30 AM  . COLONOSCOPY WITH PROPOFOL N/A 11/01/2016   Procedure: COLONOSCOPY WITH PROPOFOL;  Surgeon: Danie Binder, MD;  [facility];  Service: Endoscopy;  Laterality: N/A;  830   . COLONOSCOPY WITH PROPOFOL N/A 01/23/2018   Procedure: COLONOSCOPY WITH PROPOFOL;  Surgeon: Danie Binder, MD;  [facility];  Service: Endoscopy;  Laterality: N/A;  2:00pm - pt knows to arrive at 10:15  . ESOPHAGOGASTRODUODENOSCOPY (EGD) WITH PROPOFOL N/A 04/11/2017   Procedure: ESOPHAGOGASTRODUODENOSCOPY (EGD) WITH PROPOFOL;  Surgeon: Danie Binder, MD;  [facility];  Service: Endoscopy;  Laterality: N/A;  10:45am  . GERD    . PARTIAL HYSTERECTOMY  1987   secondary to ovarian cyst   . POLYPECTOMY  11/01/2016   Procedure: POLYPECTOMY;  Surgeon: Danie Binder, MD;  [facility];  Service: Endoscopy;;  colon  . POLYPECTOMY  01/23/2018   Procedure: POLYPECTOMY;  Surgeon: Danie Binder, MD;  [facility];  Service: Endoscopy;;  ascending colon  . SAVORY DILATION N/A 04/11/2017   Procedure: SAVORY DILATION;  Surgeon: Danie Binder, MD;  [facility];  Service: Endoscopy;  Laterality: N/A;  . TUBAL LIGATION  1975    Family History  Problem  Relation Age of Onset  . Heart attack Mother   . Heart disease Mother 82       massive heart attack  . Prostate cancer Father   . Colon cancer Father 33  . Hypertension Sister   . Ovarian cancer Sister   . Cancer Brother 80       oral   . Hypertension Brother   . Heart disease Brother      Social History   Socioeconomic History  . Marital status: Married    Spouse name: Not on file  . Number of children: 5  . Years of education: Not on file  . Highest education level: Not on file  Occupational History  . Occupation: retired   Scientific laboratory technician  . Financial resource strain: Not very hard  . Food insecurity:    Worry:  Never true    Inability: Never true  . Transportation needs:    Medical: No    Non-medical: No  Tobacco Use  . Smoking status: Former Smoker    Packs/day: 1.00    Years: 3.00    Pack years: 3.00    Last attempt to quit: 08/15/1974    Years since quitting: 43.7  . Smokeless tobacco: Never Used  Substance and Sexual Activity  . Alcohol use: No    Alcohol/week: 0.0 standard drinks  . Drug use: No  . Sexual activity: Yes    Birth control/protection: Surgical  Lifestyle  . Physical activity:    Days per week: 0 days    Minutes per session: 0 min  . Stress: Not at all  Relationships  . Social connections:    Talks on phone: More than three times a week    Gets together: Never    Attends religious service: More than 4 times per year    Active member of club or organization: No    Attends meetings of clubs or organizations: Never    Relationship status: Married  Other Topics Concern  . Not on file  Social History Narrative  . Not on file   Current Outpatient Medications on File Prior to Visit  Medication Sig Dispense Refill  . acetaminophen (TYLENOL) 500 MG tablet Take 1,000 mg by mouth 2 (two) times daily as needed for moderate pain or headache.    Marland Kitchen aspirin EC 81 MG tablet Take 81 mg by mouth at bedtime.     Marland Kitchen azelastine (ASTELIN) 0.1 % nasal spray USE ONE SPRAY(S) IN EACH NOSTRIL TWICE DAILY AS DIRECTED (Patient taking differently: USE TWO SPRAY(S) IN EACH NOSTRIL TWICE DAILY AS DIRECTED) 30 mL 12  . CALCIUM-MAGNESIUM-ZINC PO Take 1 tablet by mouth 2 (two) times daily.     . carboxymethylcellulose (REFRESH PLUS) 0.5 % SOLN Place 1-2 drops into both eyes 3 (three) times daily as needed (for dry eyes).    Rolena Infante [place] 450 MG CAPS Take 450 mg by mouth daily as needed (constipation).    . chlorthalidone (HYGROTON) 25 MG tablet TAKE 1 TABLET BY MOUTH ONCE DAILY (STOPPING  MAXZIDE) 90 tablet 2  . estradiol (ESTRACE) 0.1 MG/GM vaginal cream PLACE 1 APPLICATORFUL VAGINALLY EVERY  OTHER DAY AS NEEDED (FOR VAGINAL DISCOMFORT) 43 g 3  . fluticasone (FLONASE) 50 MCG/ACT nasal spray USE 2 SPRAY(S) IN EACH NOSTRIL ONCE DAILY 16 g 3  . loratadine (CLARITIN) 10 MG tablet Take 10 mg by mouth daily.    Marland Kitchen lovastatin (MEVACOR) 20 MG tablet TAKE 1 TABLET BY MOUTH AT BEDTIME 90 tablet 1  .  meclizine (ANTIVERT) 12.5 MG tablet TAKE ONE TABLET BY MOUTH THREE TIMES DAILY AS NEEDED FOR DIZZINESS 90 tablet 2  . nitroGLYCERIN (NITROSTAT) 0.4 MG SL tablet Place 1 tablet (0.4 mg total) under the tongue every 5 (five) minutes as needed for chest pain (hold for SBP < 110). 30 tablet 0  . Omega-3 300 MG CAPS Take 1,200 mg by mouth daily.    . pantoprazole (PROTONIX) 40 MG tablet 1 PO 30 MINUTES PRIOR TO MEALS BID FOR 3 MOS THEN QD (Patient taking differently: Take 40 mg by mouth daily before breakfast. ) 60 tablet 11  . potassium chloride SA (K-DUR,KLOR-CON) 20 MEQ tablet Take two tablets by mouth  three times daily 180 tablet 5  . pseudoephedrine (SUDAFED) 30 MG tablet Take 60 mg by mouth every 6 (six) hours as needed for congestion.    . ranitidine (ZANTAC) 300 MG tablet TAKE 1 TABLET BY MOUTH AT BEDTIME. IF NEEDED TO  CONTROL REFLUX/HEARTBURN. 30 tablet 5  . spironolactone (ALDACTONE) 25 MG tablet TAKE 1/2 (ONE-HALF) TABLET BY MOUTH ONCE DAILY 45 tablet 3   No current facility-administered medications on file prior to visit.     Review of Systems  Constitutional: Negative for activity change, appetite change, fever and unexpected weight change.  HENT: Negative for trouble swallowing.   Eyes: Negative for visual disturbance.  Respiratory: Negative for cough, chest tightness and shortness of breath.   Cardiovascular: Negative for chest pain, palpitations and leg swelling.  Gastrointestinal: Negative for abdominal distention, abdominal pain, nausea and vomiting.  Genitourinary: Positive for dysuria, frequency and urgency. Negative for decreased urine volume, hematuria, vaginal bleeding, vaginal  discharge and vaginal pain.  Neurological: Negative for dizziness, syncope and light-headedness.  Hematological: Negative for adenopathy.     Objective:   BP 130/76 (BP Location: Left Arm, Patient Position: Sitting, Cuff Size: Large)   Pulse 89   Temp 98.2 F (36.8 C) (Temporal)   Resp 12   Ht 5\' 6"  (1.676 m)   Wt 221 lb 0.6 oz (100.3 kg)   SpO2 97% Comment: room air  BMI 35.68 kg/m   Physical Exam  Constitutional: She appears well-developed and well-nourished.  HENT:  Head: Normocephalic and atraumatic.  Eyes: Pupils are equal, round, and reactive to light. Conjunctivae are normal. No scleral icterus.  Cardiovascular: Normal rate, regular rhythm and normal heart sounds.  Abdominal: Soft. Bowel sounds are normal. She exhibits no distension. There is tenderness in the suprapubic area. There is no rigidity, no rebound, no guarding and no CVA tenderness.  Mild suprapubic tenderness to palpation.  Patient defers pelvic exam today.  Vitals reviewed.  Point of care urinalysis reviewed which was otherwise negative other than trace blood. Assessment and Plan   1. Acute cystitis with hematuria We will send urine for urine microscopy and culture.  Urine cytology sent for gonorrhea, chlamydia, trichomonas.  We will go ahead and start Bactrim DS, 1 p.o. twice daily x5 days.  Will call patient with culture results. - Urine Culture - Urine cytology ancillary only - Urinalysis, Routine w reflex microscopic - fluconazole (DIFLUCAN) 150 MG tablet; Take one pill every 3 days  Dispense: 1 tablet; Refill: 0 She was encouraged to call with any questions or concerns.  Supportive care discussed.  Safe sexual practices recommended.  Call with any worrisome signs or symptoms.  Patient was told that due to the fact that she did have blood in her urine that she will need a repeat urine by her PCP in  the next month to ensure that the microscopic hematuria has resolved because this could be a sign of an  underlying problem with her bladder or genitourinary system.  She voiced understanding. 4. Acute vaginitis Patient does request a prescription for Diflucan due to the fact that she always gets a yeast infection after taking antibiotics.  I did give her prescription for this.  She was told if she gets symptoms consistent with a yeast infection she can take this medicine, however she should stop her cholesterol medication while taking the Diflucan.  She voiced understanding.  She was also told if she develops any pelvic pain, fevers, or other worrisome symptoms she should let our office know. - fluconazole (DIFLUCAN) 150 MG tablet; Take one pill every 3 days  Dispense: 1 tablet; Refill: 0  Return in about 4 weeks (around 05/16/2018), or if symptoms worsen or fail to improve, for Keep scheduled with PCP in 1 month for recheck urine.Caren Macadam, MD 04/18/2018

## 2018-04-18 NOTE — Patient Instructions (Signed)
When you take the yeast pill, do not take your cholesterol medication.

## 2018-04-19 ENCOUNTER — Inpatient Hospital Stay (HOSPITAL_COMMUNITY): Admission: RE | Admit: 2018-04-19 | Payer: Self-pay | Source: Other Acute Inpatient Hospital

## 2018-04-19 LAB — URINE CYTOLOGY ANCILLARY ONLY
Chlamydia: NEGATIVE
NEISSERIA GONORRHEA: NEGATIVE
Trichomonas: NEGATIVE

## 2018-04-21 LAB — URINE CULTURE

## 2018-04-22 LAB — URINE CYTOLOGY ANCILLARY ONLY: Bacterial vaginitis: NEGATIVE

## 2018-04-24 ENCOUNTER — Other Ambulatory Visit (HOSPITAL_COMMUNITY)
Admission: RE | Admit: 2018-04-24 | Discharge: 2018-04-24 | Disposition: A | Discharge status: Home / Self Care | Payer: PPO | Source: Ambulatory Visit | Attending: Family Medicine | Admitting: Family Medicine

## 2018-04-24 ENCOUNTER — Encounter: Payer: Self-pay | Admitting: Family Medicine

## 2018-04-24 ENCOUNTER — Ambulatory Visit (INDEPENDENT_AMBULATORY_CARE_PROVIDER_SITE_OTHER): Payer: PPO | Admitting: Family Medicine

## 2018-04-24 VITALS — BP 120/80 | HR 72 | Resp 16 | Ht 66.0 in | Wt 221.0 lb

## 2018-04-24 DIAGNOSIS — N3001 Acute cystitis with hematuria: Secondary | ICD-10-CM

## 2018-04-24 DIAGNOSIS — I10 Essential (primary) hypertension: Secondary | ICD-10-CM

## 2018-04-24 DIAGNOSIS — N3020 Other chronic cystitis without hematuria: Secondary | ICD-10-CM | POA: Diagnosis not present

## 2018-04-24 DIAGNOSIS — N3000 Acute cystitis without hematuria: Secondary | ICD-10-CM | POA: Insufficient documentation

## 2018-04-24 DIAGNOSIS — Z23 Encounter for immunization: Secondary | ICD-10-CM

## 2018-04-24 MED ORDER — FLUCONAZOLE 150 MG PO TABS: 150.0000 mg | ORAL_TABLET | Freq: Once | ORAL | 0 refills | Status: DC

## 2018-04-24 MED ORDER — CIPROFLOXACIN HCL 500 MG PO TABS: 500.0000 mg | ORAL_TABLET | Freq: Two times a day (BID) | ORAL | 0 refills | Status: DC

## 2018-04-24 NOTE — Patient Instructions (Addendum)
F/U as before , call if you need me sooner  I am sending in 5 days of ciprofloxacin and 1 day of fluconazole and will send the urine you gave [place] for culture, if this is again contaminated and you still have symptoms I am referring you to the urologist  Flu vaccine today

## 2018-04-24 NOTE — Progress Notes (Signed)
   Sarah Coleman     MRN: [id number]      DOB: 01/10/1951   HPI Sarah Coleman is here for follow up .of persistent uTI symptoms  3 wek h/o suprapubic pressure with frequency and nocturia on average 4 times at night with small amount of urine and dysuria was treated with septra on 09/4 for for 5 days feels better but still symptomatic, labs inconclusive. She denies fever, chills or flank pain  ROS Denies recent fever or chills. Denies sinus pressure, nasal congestion, ear pain or sore throat. Denies chest congestion, productive cough or wheezing. Denies chest pains, palpitations and leg swelling  Denies joint pain, swelling and limitation in mobility. Denies headaches, seizures, numbness, or tingling. Denies depression, anxiety or insomnia. Denies skin break down or rash.   PE  BP 120/80   Pulse 72   Resp 16   Ht 5\' 6"  (1.676 m)   Wt 221 lb (100.2 kg)   SpO2 98%   BMI 35.67 kg/m   Patient alert and oriented and in no cardiopulmonary distress.  HEENT: No facial asymmetry, EOMI,   oropharynx pink and moist.  Neck supple no JVD, no mass.  Chest: Clear to auscultation bilaterally.  CVS: S1, S2 no murmurs, no S3.Regular rate.  ABD: Soft tender in suprapubic area. GU: introitus normal no ulcer or discharge, vitiligo present Attempt at obtaining catheter specimen of urine was unsuccessful and painful for  patient  Ext: No edema  MS: Adequate though  ROM spine, shoulders, hips and knees.  Skin: Intact, no ulcerations or rash noted.  Psych: Good eye contact, normal affect. Memory intact not anxious or depressed appearing.  CNS: CN 2-12 intact, power,  normal throughout.no focal deficits noted.   Assessment & Plan  Chronic cystitis with negative culture Recurrent symptoms , most recent in the last 3 weeks , of dysuria, urgency and poor urinary stream. Treated with septra recently, inconclusive culture . Remains symptomatic , and repeat c/s lactobacillus, 40, 000. Needs  urology evaluation  Essential hypertension Controlled, no change in medication DASH diet and commitment to daily physical activity for a minimum of 30 minutes discussed and encouraged, as a part of hypertension management. The importance of attaining a healthy weight is also discussed.  BP/Weight 04/24/2018 04/18/2018 02/21/2018 01/30/2018 01/23/2018 01/16/2018 12/07/9561  Systolic BP 875 643 329 518 841 660 630  Diastolic BP 80 76 80 71 78 95 83  Wt. (Lbs) 221 221.04 221 224.6 - 218 220.6  BMI 35.67 35.68 35.67 36.25 - 35.19 35.61       Morbid obesity ([facility]) Unchanged. Patient re-educated about  the importance of commitment to a  minimum of 150 minutes of exercise per week.  The importance of healthy food choices with portion control discussed. Encouraged to start a food diary, count calories and to consider  joining a support group. Sample diet sheets offered. Goals set by the patient for the next several months.   Weight /BMI 04/24/2018 04/18/2018 02/21/2018  WEIGHT 221 lb 221 lb 0.6 oz 221 lb  HEIGHT 5\' 6"  5\' 6"  5\' 6"   BMI 35.67 kg/m2 35.68 kg/m2 35.67 kg/m2

## 2018-04-25 ENCOUNTER — Other Ambulatory Visit (HOSPITAL_COMMUNITY)
Admission: RE | Admit: 2018-04-25 | Discharge: 2018-04-25 | Disposition: A | Discharge status: Home / Self Care | Payer: PPO | Source: Ambulatory Visit | Attending: Family Medicine | Admitting: Family Medicine

## 2018-04-25 DIAGNOSIS — N3000 Acute cystitis without hematuria: Secondary | ICD-10-CM | POA: Insufficient documentation

## 2018-04-27 ENCOUNTER — Inpatient Hospital Stay (HOSPITAL_COMMUNITY): Admit: 2018-04-27 | Payer: Self-pay

## 2018-04-27 LAB — URINE CULTURE: Culture: 40000 — AB

## 2018-04-30 ENCOUNTER — Telehealth: Payer: Self-pay | Admitting: Family Medicine

## 2018-04-30 ENCOUNTER — Other Ambulatory Visit: Payer: Self-pay

## 2018-04-30 MED ORDER — FLUCONAZOLE 150 MG PO TABS: 150.0000 mg | ORAL_TABLET | Freq: Once | ORAL | 0 refills | Status: AC

## 2018-04-30 NOTE — Telephone Encounter (Signed)
1 additional pill sent

## 2018-04-30 NOTE — Telephone Encounter (Signed)
Pt is calling she has lost the second pill needs a refill, yeast infection medication

## 2018-05-02 ENCOUNTER — Other Ambulatory Visit: Payer: Self-pay | Admitting: Family Medicine

## 2018-05-02 ENCOUNTER — Ambulatory Visit: Payer: PPO | Admitting: Family Medicine

## 2018-05-02 DIAGNOSIS — N3090 Cystitis, unspecified without hematuria: Secondary | ICD-10-CM

## 2018-05-04 ENCOUNTER — Encounter: Payer: Self-pay | Admitting: Family Medicine

## 2018-05-04 DIAGNOSIS — N3020 Other chronic cystitis without hematuria: Secondary | ICD-10-CM | POA: Insufficient documentation

## 2018-05-04 NOTE — Assessment & Plan Note (Signed)
Unchanged. Patient re-educated about  the importance of commitment to a  minimum of 150 minutes of exercise per week.  The importance of healthy food choices with portion control discussed. Encouraged to start a food diary, count calories and to consider  joining a support group. Sample diet sheets offered. Goals set by the patient for the next several months.   Weight /BMI 04/24/2018 04/18/2018 02/21/2018  WEIGHT 221 lb 221 lb 0.6 oz 221 lb  HEIGHT 5\' 6"  5\' 6"  5\' 6"   BMI 35.67 kg/m2 35.68 kg/m2 35.67 kg/m2

## 2018-05-04 NOTE — Assessment & Plan Note (Signed)
Controlled, no change in medication DASH diet and commitment to daily physical activity for a minimum of 30 minutes discussed and encouraged, as a part of hypertension management. The importance of attaining a healthy weight is also discussed.  BP/Weight 04/24/2018 04/18/2018 02/21/2018 01/30/2018 01/23/2018 01/16/2018 0/68/9340  Systolic BP 684 033 533 174 099 278 004  Diastolic BP 80 76 80 71 78 95 83  Wt. (Lbs) 221 221.04 221 224.6 - 218 220.6  BMI 35.67 35.68 35.67 36.25 - 35.19 35.61

## 2018-05-04 NOTE — Assessment & Plan Note (Signed)
Recurrent symptoms , most recent in the last 3 weeks , of dysuria, urgency and poor urinary stream. Treated with septra recently, inconclusive culture . Remains symptomatic , and repeat c/s lactobacillus, 40, 000. Needs urology evaluation

## 2018-05-17 ENCOUNTER — Other Ambulatory Visit: Payer: Self-pay

## 2018-05-17 ENCOUNTER — Telehealth: Payer: Self-pay | Admitting: Family Medicine

## 2018-05-17 MED ORDER — LORATADINE 10 MG PO TABS: 10.0000 mg | ORAL_TABLET | Freq: Every day | ORAL | 1 refills | Status: DC

## 2018-05-17 NOTE — Telephone Encounter (Signed)
Loratadine sent to pharmacy

## 2018-05-17 NOTE — Telephone Encounter (Signed)
Called patient back to discuss which medication she is talking about. Requested call back.

## 2018-05-17 NOTE — Telephone Encounter (Signed)
Pt is having trouble getting over the counter PRESCRIPTION filled for allergy medicine... It is Cheaper for her to go thru the insurance--can you assist.m

## 2018-05-28 ENCOUNTER — Telehealth: Payer: Self-pay | Admitting: Family Medicine

## 2018-05-28 NOTE — Telephone Encounter (Signed)
Please call in medication for her allergy problem.  She said the same medication she had in the past will be fine.

## 2018-05-29 DIAGNOSIS — I10 Essential (primary) hypertension: Secondary | ICD-10-CM | POA: Diagnosis not present

## 2018-05-29 DIAGNOSIS — E785 Hyperlipidemia, unspecified: Secondary | ICD-10-CM | POA: Diagnosis not present

## 2018-05-29 DIAGNOSIS — E559 Vitamin D deficiency, unspecified: Secondary | ICD-10-CM | POA: Diagnosis not present

## 2018-05-30 ENCOUNTER — Ambulatory Visit: Payer: PPO | Admitting: Nurse Practitioner

## 2018-05-30 LAB — LIPID PANEL
Cholesterol: 180 mg/dL (ref <200)
HDL: 36 mg/dL — ABNORMAL LOW (ref >50)
LDL Cholesterol (Calc): 92 mg/dL (calc)
NON-HDL CHOLESTEROL (CALC): 144 mg/dL — AB (ref <130)
Total CHOL/HDL Ratio: 5 (calc) — ABNORMAL HIGH (ref <5.0)
Triglycerides: 387 mg/dL — ABNORMAL HIGH (ref <150)

## 2018-05-30 LAB — COMPLETE METABOLIC PANEL WITH GFR
AG Ratio: 1.8 (calc) (ref 1.0–2.5)
ALBUMIN MSPROF: 4.7 g/dL (ref 3.6–5.1)
ALT: 12 U/L (ref 6–29)
AST: 14 U/L (ref 10–35)
Alkaline phosphatase (APISO): 55 U/L (ref 33–130)
BILIRUBIN TOTAL: 0.6 mg/dL (ref 0.2–1.2)
BUN: 11 mg/dL (ref 7–25)
CALCIUM: 9.8 mg/dL (ref 8.6–10.4)
CHLORIDE: 102 mmol/L (ref 98–110)
CO2: 25 mmol/L (ref 20–32)
CREATININE: 0.96 mg/dL (ref 0.50–0.99)
GFR, EST AFRICAN AMERICAN: 70 mL/min/{1.73_m2} (ref >=60)
GFR, Est Non African American: 61 mL/min/{1.73_m2} (ref >=60)
GLUCOSE: 97 mg/dL (ref 65–99)
Globulin: 2.6 g/dL (calc) (ref 1.9–3.7)
Potassium: 3.8 mmol/L (ref 3.5–5.3)
Sodium: 138 mmol/L (ref 135–146)
TOTAL PROTEIN: 7.3 g/dL (ref 6.1–8.1)

## 2018-05-30 LAB — URINE CULTURE
MICRO NUMBER:: 91238133
SPECIMEN QUALITY:: ADEQUATE

## 2018-05-30 LAB — VITAMIN D 25 HYDROXY (VIT D DEFICIENCY, FRACTURES): VIT D 25 HYDROXY: 48 ng/mL (ref 30–100)

## 2018-05-31 ENCOUNTER — Ambulatory Visit (INDEPENDENT_AMBULATORY_CARE_PROVIDER_SITE_OTHER): Payer: PPO | Admitting: Nurse Practitioner

## 2018-05-31 ENCOUNTER — Encounter: Payer: Self-pay | Admitting: Nurse Practitioner

## 2018-05-31 ENCOUNTER — Ambulatory Visit (HOSPITAL_COMMUNITY)
Admission: RE | Admit: 2018-05-31 | Discharge: 2018-05-31 | Disposition: A | Discharge status: Home / Self Care | Payer: PPO | Source: Ambulatory Visit | Attending: Family Medicine | Admitting: Family Medicine

## 2018-05-31 ENCOUNTER — Encounter: Payer: Self-pay | Admitting: Family Medicine

## 2018-05-31 ENCOUNTER — Ambulatory Visit (INDEPENDENT_AMBULATORY_CARE_PROVIDER_SITE_OTHER): Payer: PPO | Admitting: Family Medicine

## 2018-05-31 VITALS — BP 138/80 | HR 83 | Resp 12 | Ht 66.0 in | Wt 224.1 lb

## 2018-05-31 VITALS — BP 133/81 | HR 75 | Temp 97.5°F | Ht 66.0 in | Wt 221.8 lb

## 2018-05-31 DIAGNOSIS — M549 Dorsalgia, unspecified: Secondary | ICD-10-CM

## 2018-05-31 DIAGNOSIS — K649 Unspecified hemorrhoids: Secondary | ICD-10-CM | POA: Diagnosis not present

## 2018-05-31 DIAGNOSIS — J329 Chronic sinusitis, unspecified: Secondary | ICD-10-CM

## 2018-05-31 DIAGNOSIS — J309 Allergic rhinitis, unspecified: Secondary | ICD-10-CM

## 2018-05-31 DIAGNOSIS — I10 Essential (primary) hypertension: Secondary | ICD-10-CM

## 2018-05-31 DIAGNOSIS — Z0000 Encounter for general adult medical examination without abnormal findings: Secondary | ICD-10-CM | POA: Diagnosis not present

## 2018-05-31 DIAGNOSIS — R7303 Prediabetes: Secondary | ICD-10-CM

## 2018-05-31 DIAGNOSIS — K219 Gastro-esophageal reflux disease without esophagitis: Secondary | ICD-10-CM | POA: Diagnosis not present

## 2018-05-31 DIAGNOSIS — R42 Dizziness and giddiness: Secondary | ICD-10-CM

## 2018-05-31 DIAGNOSIS — R1310 Dysphagia, unspecified: Secondary | ICD-10-CM | POA: Diagnosis not present

## 2018-05-31 DIAGNOSIS — E785 Hyperlipidemia, unspecified: Secondary | ICD-10-CM | POA: Diagnosis not present

## 2018-05-31 DIAGNOSIS — R1319 Other dysphagia: Secondary | ICD-10-CM

## 2018-05-31 DIAGNOSIS — Z1231 Encounter for screening mammogram for malignant neoplasm of breast: Secondary | ICD-10-CM

## 2018-05-31 DIAGNOSIS — Z23 Encounter for immunization: Secondary | ICD-10-CM | POA: Diagnosis not present

## 2018-05-31 DIAGNOSIS — J3489 Other specified disorders of nose and nasal sinuses: Secondary | ICD-10-CM | POA: Diagnosis not present

## 2018-05-31 MED ORDER — PREDNISONE 10 MG PO TABS: 10.0000 mg | ORAL_TABLET | Freq: Two times a day (BID) | ORAL | 0 refills | Status: AC

## 2018-05-31 MED ORDER — METHYLPREDNISOLONE ACETATE 80 MG/ML IJ SUSP
80.0000 mg | Freq: Once | INTRAMUSCULAR | Status: AC
  Administered 2018-05-31: 80 mg via INTRAMUSCULAR

## 2018-05-31 MED ORDER — MONTELUKAST SODIUM 10 MG PO TABS: 10.0000 mg | ORAL_TABLET | Freq: Every day | ORAL | 3 refills | Status: DC

## 2018-05-31 MED ORDER — MECLIZINE HCL 12.5 MG PO TABS: 12.5000 mg | ORAL_TABLET | Freq: Three times a day (TID) | ORAL | 1 refills | Status: DC | PRN

## 2018-05-31 MED ORDER — HYDROCORTISONE 2.5 % RE CREA: 1.0000 "application " | TOPICAL_CREAM | Freq: Two times a day (BID) | RECTAL | 1 refills | Status: DC

## 2018-05-31 NOTE — Progress Notes (Signed)
cc'ed to pcp °

## 2018-05-31 NOTE — Telephone Encounter (Signed)
Loratadine was sent in on 10/3

## 2018-05-31 NOTE — Patient Instructions (Addendum)
F./U in 3.5 months, call if you need me before  Please schedule mammogram at checkout  Depo Medrol 80 mg IM  today for allergies   Prednisone, singulair and meclizine are all prescribed  Stay on the current meds for allergies  X ray of sinus today after you leave  HBA1C, fasting lipid, cmp and EGFR and TSH today

## 2018-05-31 NOTE — Progress Notes (Signed)
Sarah Coleman     MRN: [id number]      DOB: [birth year]  HPI: Patient is in for annual physical exam. 2 week h/o frontal headache, tightness and pressure, clear post nasal drainage, dizziness x 2 weeks .   PE: BP 138/80 (BP Location: Right Arm, Patient Position: Sitting, Cuff Size: Large)   Pulse 83   Resp 12   Ht 5\' 6"  (1.676 m)   Wt 224 lb 1.9 oz (101.7 kg)   SpO2 97%   BMI 36.17 kg/m   Pleasant  female, alert and oriented x 3, in no cardio-pulmonary distress. Afebrile. HEENT No facial trauma or asymetry. Frontal Sinus tender.  Extra occullar muscles intact. No nystagmus External ears normal, tympanic membranes clear. Oropharynx moist, no exudate.Nasal mucosa erythematous and edematous Neck: supple, no adenopathy,JVD or thyromegaly.No bruits.  Chest: Clear to ascultation bilaterally.No crackles or wheezes. Non tender to palpation  Breast: No asymetry,no masses or lumps. No tenderness. No nipple discharge or inversion. No axillary or supraclavicular adenopathy  Cardiovascular system; Heart sounds normal,  S1 and  S2 ,no S3.  No murmur, or thrill. Apical beat not displaced Peripheral pulses normal.  Abdomen: Soft, non tender, no organomegaly or masses. No bruits. Bowel sounds normal. No guarding, tenderness or rebound.   Musculoskeletal exam: Decreased  ROM of spine, normal in hips , shoulders and knees.  deformity ,swelling and  crepitus noted. No muscle wasting or atrophy.   Neurologic: Cranial nerves 2 to 12 intact. Power, tone ,sensation and reflexes normal throughout. No disturbance in gait. No tremor.  Skin: Intact, no ulceration, erythema , scaling or rash noted. Pigmentation normal throughout  Psych; Normal mood and affect. Judgement and concentration normal   Assessment & Plan:  Allergic rhinitis Uncontrolled, DepoMedrol 80 mg IM , and pt re educated re the need to use allergy medication daily as prescribed, sinus X ray  ordered  Annual physical exam Annual exam as documented. Counseling done  re healthy lifestyle involving commitment to 150 minutes exercise per week, heart healthy diet, and attaining healthy weight.The importance of adequate sleep also discussed. Immunization and cancer screening needs are specifically addressed at this visit.   Morbid obesity ([place]) Deteriorated. Patient re-educated about  the importance of commitment to a  minimum of 150 minutes of exercise per week.  The importance of healthy food choices with portion control discussed. Encouraged to start a food diary, count calories and to consider  joining a support group. Sample diet sheets offered. Goals set by the patient for the next several months.   Weight /BMI 05/31/2018 05/31/2018 04/24/2018  WEIGHT 224 lb 1.9 oz 221 lb 12.8 oz 221 lb  HEIGHT 5\' 6"  5\' 6"  5\' 6"   BMI 36.17 kg/m2 35.8 kg/m2 35.67 kg/m2      Vertigo Recurrent vertigo with acute flare, antivert prescribed  Hyperlipidemia LDL goal <100 Hyperlipidemia:Low fat diet discussed and encouraged.   Lipid Panel  Lab Results  Component Value Date   CHOL 172 05/31/2018   HDL 33 (L) 05/31/2018   LDLCALC  05/31/2018     Comment:     . LDL cholesterol not calculated. Triglyceride levels greater than 400 mg/dL invalidate calculated LDL results. . Reference range: <100 . Desirable range <100 mg/dL for primary prevention;   <70 mg/dL for patients with CHD or diabetic patients  with > or = 2 CHD risk factors. Marland Kitchen LDL-C is now calculated using the Martin-Hopkins  calculation, which is a validated novel method providing  better  accuracy than the Friedewald equation in the  estimation of LDL-C.  Cresenciano Genre et al. Annamaria Helling. 8756;433(29): 2061-2068  (http://education.[facility].com/faq/FAQ164)    LDLDIRECT 69 02/13/2009   TRIG 486 (H) 05/31/2018   CHOLHDL 5.2 (H) 05/31/2018   Needs to reduce cheese, butter and oils due to elevated  TG    Prediabetes Patient educated about the importance of limiting  Carbohydrate intake , the need to commit to daily physical activity for a minimum of 30 minutes , and to commit weight loss. The fact that changes in all these areas will reduce or eliminate all together the development of diabetes is stressed.  Improved, she is applauded on this  Diabetic Labs Latest Ref Rng & Units 05/31/2018 05/29/2018 01/16/2018 10/23/2017 05/22/2017  HbA1c <5.7 % of total Hgb 5.9(H) - - 6.2(H) 6.0(H)  Chol <200 mg/dL 172 180 - 186 170  HDL >50 mg/dL 33(L) 36(L) - 42(L) 39(L)  Calc LDL mg/dL (calc) - 92 - 104(H) 92  Triglycerides <150 mg/dL 486(H) 387(H) - 300(H) 293(H)  Creatinine 0.50 - 0.99 mg/dL 1.03(H) 0.96 0.94 0.97 1.00(H)   BP/Weight 05/31/2018 05/31/2018 04/24/2018 04/18/2018 02/21/2018 01/30/2018 12/31/8414  Systolic BP 606 301 601 093 235 573 220  Diastolic BP 80 81 80 76 80 71 78  Wt. (Lbs) 224.12 221.8 221 221.04 221 224.6 -  BMI 36.17 35.8 35.67 35.68 35.67 36.25 -   No flowsheet data found.

## 2018-05-31 NOTE — Progress Notes (Signed)
Referring Provider: Fayrene Helper, MD Primary Care Physician:  Fayrene Helper, MD Primary GI:  Dr. Oneida Alar  Chief Complaint  Patient presents with  . Gastroesophageal Reflux    f/u. DOing okay. has problems only when she eats greasy food  . Dysphagia    doing fine    HPI:   Sarah Coleman is a 67 y.o. female who presents for follow-up on GERD and dysphasia.  Patient was last seen in our office 11/28/2017 for the same as well as adenomatous colon polyp.  History of chronic GERD.  Cardiovascular work-up found to be low overall cardiovascular risk.  EGD on 04/11/2017 with distal esophageal web status post dilation and stomach biopsies finding likely mild gastritis.  Symptoms deemed due to aspirin.  Last colonoscopy up-to-date on 11/01/2016 and due in 1 to 3 years (2019 11/02/2019).  At her last visit she was doing well with her GERD on daily Protonix with occasional breakthrough due to dietary indiscretions.  Denies further dysphagia.  No other GI symptoms.  Recommended scheduling a follow-up colonoscopy, continue current medications, follow-up in 6 months.  Colonoscopy completed 01/23/2018 which found two 4 to 5 mm polyps at splenic flexure and descending colon, a single 4 mm polyp in the sigmoid colon, moderate diverticulosis in the rectosigmoid colon and sigmoid colon, external and internal hemorrhoids, redundant left colon.  Surgical pathology found the polyps to be tubular adenoma without high-grade dysplasia or malignancy. Recommended repeat colonoscopy in 3 years.  Follow-up in 6 months in the office.  Today she states she's doing ok overall. Taking GERD medication (Zantac) as needed. Breakthrough only with dietary indiscretions. No further dyshaphia. Denies abdominal pain, N/V, hematochezia, melena, fever, chills, unintentional weight loss. Occasional/rare hemorrhoid symptoms helped with Preparation H. Denies chest pain, dyspnea, dizziness, lightheadedness, syncope, near syncope.  Denies any other upper or lower GI symptoms.  Past Medical History:  Diagnosis Date  . Allergic rhinitis   . Allergy   . Anemia   . Arthritis   . GERD (gastroesophageal reflux disease) 2013  . Hyperlipidemia   . Hypertension   . Metabolic syndrome X 9030  . Morbid obesity ([place]) 2000  . Obesity   . Prediabetes 2010  . Seizures ([place])    as child; unknown etiology.    Past Surgical History:  Procedure Laterality Date  . BIOPSY  04/11/2017   Procedure: BIOPSY;  Surgeon: Danie Binder, MD;  [facility];  Service: Endoscopy;;  gastric bx's  . COLONOSCOPY N/A 10/07/2016   Procedure: COLONOSCOPY;  Surgeon: Danie Binder, MD;  [facility];  Service: Endoscopy;  Laterality: N/A;  9:30 AM  . COLONOSCOPY WITH PROPOFOL N/A 11/01/2016   Procedure: COLONOSCOPY WITH PROPOFOL;  Surgeon: Danie Binder, MD;  [facility]: [facility];  Service: Endoscopy;  Laterality: N/A;  830   . COLONOSCOPY WITH PROPOFOL N/A 01/23/2018   Procedure: COLONOSCOPY WITH PROPOFOL;  Surgeon: Danie Binder, MD;  [facility];  Service: Endoscopy;  Laterality: N/A;  2:00pm - pt knows to arrive at 10:15  . ESOPHAGOGASTRODUODENOSCOPY (EGD) WITH PROPOFOL N/A 04/11/2017   Procedure: ESOPHAGOGASTRODUODENOSCOPY (EGD) WITH PROPOFOL;  Surgeon: Danie Binder, MD;  [facility];  Service: Endoscopy;  Laterality: N/A;  10:45am  . GERD    . PARTIAL HYSTERECTOMY  1987   secondary to ovarian cyst   . POLYPECTOMY  11/01/2016   Procedure: POLYPECTOMY;  Surgeon: Danie Binder, MD;  [facility];  Service: Endoscopy;;  colon  . POLYPECTOMY  01/23/2018   Procedure: POLYPECTOMY;  Surgeon: Danie Binder, MD;  [facility];  Service: Endoscopy;;  ascending colon  . SAVORY DILATION N/A 04/11/2017   Procedure: SAVORY DILATION;  Surgeon: Danie Binder, MD;  [facility];  Service: Endoscopy;  Laterality: N/A;  . TUBAL LIGATION  1975    Current Outpatient  Medications  Medication Sig Dispense Refill  . acetaminophen (TYLENOL) 500 MG tablet Take 1,000 mg by mouth 2 (two) times daily as needed for moderate pain or headache.    Marland Kitchen aspirin EC 81 MG tablet Take 81 mg by mouth at bedtime.     Marland Kitchen azelastine (ASTELIN) 0.1 % nasal spray USE 1 SPRAY(S) IN EACH NOSTRIL TWICE DAILY AS DIRECTED 30 mL 12  . CALCIUM-MAGNESIUM-ZINC PO Take 1 tablet by mouth 2 (two) times daily.     . carboxymethylcellulose (REFRESH PLUS) 0.5 % SOLN Place 1-2 drops into both eyes 3 (three) times daily as needed (for dry eyes).    . chlorthalidone (HYGROTON) 25 MG tablet TAKE 1 TABLET BY MOUTH ONCE DAILY (STOPPING  MAXZIDE) 90 tablet 2  . estradiol (ESTRACE) 0.1 MG/GM vaginal cream PLACE 1 APPLICATORFUL VAGINALLY EVERY OTHER DAY AS NEEDED (FOR VAGINAL DISCOMFORT) 43 g 3  . fluticasone (FLONASE) 50 MCG/ACT nasal spray USE 2 SPRAY(S) IN EACH NOSTRIL ONCE DAILY 16 g 3  . Iron-Vitamins (GERITOL PO) Take by mouth daily.    Marland Kitchen loratadine (CLARITIN) 10 MG tablet Take 1 tablet (10 mg total) by mouth daily. 90 tablet 1  . lovastatin (MEVACOR) 20 MG tablet TAKE 1 TABLET BY MOUTH AT BEDTIME 90 tablet 1  . meclizine (ANTIVERT) 12.5 MG tablet TAKE ONE TABLET BY MOUTH THREE TIMES DAILY AS NEEDED FOR DIZZINESS 90 tablet 2  . nitroGLYCERIN (NITROSTAT) 0.4 MG SL tablet Place 1 tablet (0.4 mg total) under the tongue every 5 (five) minutes as needed for chest pain (hold for SBP < 110). 30 tablet 0  . Omega-3 300 MG CAPS Take 1,200 mg by mouth daily.    . potassium chloride SA (K-DUR,KLOR-CON) 20 MEQ tablet Take two tablets by mouth  three times daily 180 tablet 5  . pseudoephedrine (SUDAFED) 30 MG tablet Take 60 mg by mouth every 6 (six) hours as needed for congestion.    . ranitidine (ZANTAC) 300 MG tablet TAKE 1 TABLET BY MOUTH AT BEDTIME. IF NEEDED TO  CONTROL REFLUX/HEARTBURN. 30 tablet 5  . spironolactone (ALDACTONE) 25 MG tablet TAKE 1/2 (ONE-HALF) TABLET BY MOUTH ONCE DAILY 45 tablet 3  .  hydrocortisone (ANUSOL-HC) 2.5 % rectal cream Place 1 application rectally 2 (two) times daily. 30 g 1   No current facility-administered medications for this visit.     Allergies as of 05/31/2018 - Review Complete 05/31/2018  Allergen Reaction Noted  . Cinnamon Swelling 10/24/2016  . Ibuprofen Swelling 12/06/2007  . Penicillins Nausea And Vomiting 12/06/2007  . Sweet potato Swelling 10/24/2016    Family History  Problem Relation Age of Onset  . Heart attack Mother   . Heart disease Mother 72       massive heart attack  . Prostate cancer Father   . Colon cancer Father 76  . Hypertension Sister   . Ovarian cancer Sister   . Cancer Brother 29       oral   . Hypertension Brother   . Heart disease Brother     Social History   Socioeconomic  History  . Marital status: Married    Spouse name: Not on file  . Number of children: 5  . Years of education: Not on file  . Highest education level: Not on file  Occupational History  . Occupation: retired   Scientific laboratory technician  . Financial resource strain: Not very hard  . Food insecurity:    Worry: Never true    Inability: Never true  . Transportation needs:    Medical: No    Non-medical: No  Tobacco Use  . Smoking status: Former Smoker    Packs/day: 1.00    Years: 3.00    Pack years: 3.00    Last attempt to quit: 08/15/1974    Years since quitting: 43.8  . Smokeless tobacco: Never Used  Substance and Sexual Activity  . Alcohol use: No    Alcohol/week: 0.0 standard drinks  . Drug use: No  . Sexual activity: Yes    Birth control/protection: Surgical  Lifestyle  . Physical activity:    Days per week: 0 days    Minutes per session: 0 min  . Stress: Not at all  Relationships  . Social connections:    Talks on phone: More than three times a week    Gets together: Never    Attends religious service: More than 4 times per year    Active member of club or organization: No    Attends meetings of clubs or organizations: Never     Relationship status: Married  Other Topics Concern  . Not on file  Social History Narrative  . Not on file    Review of Systems: Complete ROS negative except as per HPI.   Physical Exam: BP 133/81   Pulse 75   Temp (!) 97.5 F (36.4 C) (Oral)   Ht 5\' 6"  (1.676 m)   Wt 221 lb 12.8 oz (100.6 kg)   BMI 35.80 kg/m  General:   Alert and oriented. Pleasant and cooperative. Well-nourished and well-developed.  Eyes:  Without icterus, sclera clear and conjunctiva pink.  Ears:  Normal auditory acuity. Cardiovascular:  S1, S2 present without murmurs appreciated. Extremities without clubbing or edema. Respiratory:  Clear to auscultation bilaterally. No wheezes, rales, or rhonchi. No distress.  Gastrointestinal:  +BS, soft, non-tender and non-distended. No HSM noted. No guarding or rebound. No masses appreciated.  Rectal:  Deferred  Musculoskalatal:  Symmetrical without gross deformities. Skin:  Intact without significant lesions or rashes. Neurologic:  Alert and oriented x4;  grossly normal neurologically. Psych:  Alert and cooperative. Normal mood and affect. Heme/Lymph/Immune: No excessive bruising noted.    05/31/2018 11:28 AM   Disclaimer: This note was dictated with voice recognition software. Similar sounding words can inadvertently be transcribed and may not be corrected upon review.

## 2018-05-31 NOTE — Assessment & Plan Note (Signed)
GERD symptoms doing well.  She generally does not have breakthrough unless she has a known dietary indiscretion.  She is currently taking Zantac as needed.  No further PPI.  Recommend she continue her current medications that seem to be working well for her.  Follow-up in 1 year.

## 2018-05-31 NOTE — Patient Instructions (Signed)
1. I have sent in Anusol rectal cream to your pharmacy.  You can take this up to twice a day, for up to 10 days at a time as needed for hemorrhoid symptoms. 2. Continue your other current medications. 3. Return for follow-up in 1 year. 4. Call us if you have any questions or concerns.  At [facility] we value your feedback. You may receive a survey about your visit today. Please share your experience as we strive to create trusting relationships with our patients to provide genuine, compassionate, quality care.  We appreciate your understanding and patience as we review any laboratory studies, imaging, and other diagnostic tests that are ordered as we care for you. Our office policy is 5 business days for review of these results, and any emergent or urgent results are addressed in a timely manner for your best interest. If you do not hear from our office in 1 week, please contact us.   We also encourage the use of MyChart, which contains your medical information for your review as well. If you are not enrolled in this feature, an access code is on this after visit summary for your convenience. Thank you for allowing [place] to be involved in your care.  It was great to meet you today!  I hope you have a great Fall!!

## 2018-05-31 NOTE — Assessment & Plan Note (Signed)
Noted hemorrhoids, occasionally symptomatic.  She uses Preparation H which works well enough.  However, she is interested in Anusol.  I will send in a prescription to take up to twice a day for up to 10 days at a time.  Call with any worsening symptoms.  Follow-up in 1 year.

## 2018-05-31 NOTE — Assessment & Plan Note (Signed)
No further dysphagia since EGD with dilation.  Continue to monitor.

## 2018-06-01 LAB — COMPLETE METABOLIC PANEL WITH GFR
AG Ratio: 1.8 (calc) (ref 1.0–2.5)
ALBUMIN MSPROF: 4.7 g/dL (ref 3.6–5.1)
ALT: 11 U/L (ref 6–29)
AST: 15 U/L (ref 10–35)
Alkaline phosphatase (APISO): 59 U/L (ref 33–130)
BUN / CREAT RATIO: 13 (calc) (ref 6–22)
BUN: 13 mg/dL (ref 7–25)
CO2: 27 mmol/L (ref 20–32)
CREATININE: 1.03 mg/dL — AB (ref 0.50–0.99)
Calcium: 9.9 mg/dL (ref 8.6–10.4)
Chloride: 103 mmol/L (ref 98–110)
GFR, EST AFRICAN AMERICAN: 65 mL/min/{1.73_m2} (ref >=60)
GFR, EST NON AFRICAN AMERICAN: 56 mL/min/{1.73_m2} — AB (ref >=60)
GLUCOSE: 98 mg/dL (ref 65–99)
Globulin: 2.6 g/dL (calc) (ref 1.9–3.7)
POTASSIUM: 4.1 mmol/L (ref 3.5–5.3)
SODIUM: 140 mmol/L (ref 135–146)
TOTAL PROTEIN: 7.3 g/dL (ref 6.1–8.1)
Total Bilirubin: 0.5 mg/dL (ref 0.2–1.2)

## 2018-06-01 LAB — TSH: TSH: 1.34 mIU/L (ref 0.40–4.50)

## 2018-06-01 LAB — LIPID PANEL
Cholesterol: 172 mg/dL (ref <200)
HDL: 33 mg/dL — AB (ref >50)
NON-HDL CHOLESTEROL (CALC): 139 mg/dL — AB (ref <130)
TRIGLYCERIDES: 486 mg/dL — AB (ref <150)
Total CHOL/HDL Ratio: 5.2 (calc) — ABNORMAL HIGH (ref <5.0)

## 2018-06-01 LAB — HEMOGLOBIN A1C
Hgb A1c MFr Bld: 5.9 % of total Hgb — ABNORMAL HIGH (ref <5.7)
Mean Plasma Glucose: 123 (calc)
eAG (mmol/L): 6.8 (calc)

## 2018-06-17 ENCOUNTER — Encounter: Payer: Self-pay | Admitting: Family Medicine

## 2018-06-17 DIAGNOSIS — R42 Dizziness and giddiness: Secondary | ICD-10-CM | POA: Insufficient documentation

## 2018-06-17 NOTE — Assessment & Plan Note (Signed)
Recurrent vertigo with acute flare, antivert prescribed

## 2018-06-17 NOTE — Assessment & Plan Note (Signed)
Patient educated about the importance of limiting  Carbohydrate intake , the need to commit to daily physical activity for a minimum of 30 minutes , and to commit weight loss. The fact that changes in all these areas will reduce or eliminate all together the development of diabetes is stressed.  Improved, she is applauded on this  Diabetic Labs Latest Ref Rng & Units 05/31/2018 05/29/2018 01/16/2018 10/23/2017 05/22/2017  HbA1c <5.7 % of total Hgb 5.9(H) - - 6.2(H) 6.0(H)  Chol <200 mg/dL 172 180 - 186 170  HDL >50 mg/dL 33(L) 36(L) - 42(L) 39(L)  Calc LDL mg/dL (calc) - 92 - 104(H) 92  Triglycerides <150 mg/dL 486(H) 387(H) - 300(H) 293(H)  Creatinine 0.50 - 0.99 mg/dL 1.03(H) 0.96 0.94 0.97 1.00(H)   BP/Weight 05/31/2018 05/31/2018 04/24/2018 04/18/2018 02/21/2018 01/30/2018 04/13/9406  Systolic BP 680 881 103 159 458 592 924  Diastolic BP 80 81 80 76 80 71 78  Wt. (Lbs) 224.12 221.8 221 221.04 221 224.6 -  BMI 36.17 35.8 35.67 35.68 35.67 36.25 -   No flowsheet data found.

## 2018-06-17 NOTE — Assessment & Plan Note (Signed)
Annual exam as documented. Counseling done  re healthy lifestyle involving commitment to 150 minutes exercise per week, heart healthy diet, and attaining healthy weight.The importance of adequate sleep also discussed.  Immunization and cancer screening needs are specifically addressed at this visit.  

## 2018-06-17 NOTE — Assessment & Plan Note (Signed)
Deteriorated. Patient re-educated about  the importance of commitment to a  minimum of 150 minutes of exercise per week.  The importance of healthy food choices with portion control discussed. Encouraged to start a food diary, count calories and to consider  joining a support group. Sample diet sheets offered. Goals set by the patient for the next several months.   Weight /BMI 05/31/2018 05/31/2018 04/24/2018  WEIGHT 224 lb 1.9 oz 221 lb 12.8 oz 221 lb  HEIGHT 5\' 6"  5\' 6"  5\' 6"   BMI 36.17 kg/m2 35.8 kg/m2 35.67 kg/m2

## 2018-06-17 NOTE — Assessment & Plan Note (Signed)
Hyperlipidemia:Low fat diet discussed and encouraged.   Lipid Panel  Lab Results  Component Value Date   CHOL 172 05/31/2018   HDL 33 (L) 05/31/2018   LDLCALC  05/31/2018     Comment:     . LDL cholesterol not calculated. Triglyceride levels greater than 400 mg/dL invalidate calculated LDL results. . Reference range: <100 . Desirable range <100 mg/dL for primary prevention;   <70 mg/dL for patients with CHD or diabetic patients  with > or = 2 CHD risk factors. Marland Kitchen LDL-C is now calculated using the Martin-Hopkins  calculation, which is a validated novel method providing  better accuracy than the Friedewald equation in the  estimation of LDL-C.  Cresenciano Genre et al. Annamaria Helling. 4496;759(16): 2061-2068  (http://education.[facility].com/faq/FAQ164)    LDLDIRECT 69 02/13/2009   TRIG 486 (H) 05/31/2018   CHOLHDL 5.2 (H) 05/31/2018   Needs to reduce cheese, butter and oils due to elevated TG

## 2018-06-17 NOTE — Assessment & Plan Note (Addendum)
Uncontrolled, DepoMedrol 80 mg IM , and pt re educated re the need to use allergy medication daily as prescribed, sinus X ray ordered

## 2018-08-06 ENCOUNTER — Ambulatory Visit (HOSPITAL_COMMUNITY): Payer: PPO

## 2018-08-16 ENCOUNTER — Encounter: Payer: Self-pay | Admitting: Family Medicine

## 2018-08-23 ENCOUNTER — Encounter: Payer: Self-pay | Admitting: Family Medicine

## 2018-08-23 ENCOUNTER — Ambulatory Visit (INDEPENDENT_AMBULATORY_CARE_PROVIDER_SITE_OTHER): Payer: PPO | Admitting: Family Medicine

## 2018-08-23 VITALS — BP 140/82 | HR 82 | Temp 98.0°F | Resp 15 | Ht 66.0 in | Wt 227.0 lb

## 2018-08-23 DIAGNOSIS — E8881 Metabolic syndrome: Secondary | ICD-10-CM | POA: Diagnosis not present

## 2018-08-23 DIAGNOSIS — I10 Essential (primary) hypertension: Secondary | ICD-10-CM | POA: Diagnosis not present

## 2018-08-23 DIAGNOSIS — J0100 Acute maxillary sinusitis, unspecified: Secondary | ICD-10-CM | POA: Diagnosis not present

## 2018-08-23 DIAGNOSIS — E785 Hyperlipidemia, unspecified: Secondary | ICD-10-CM

## 2018-08-23 DIAGNOSIS — J309 Allergic rhinitis, unspecified: Secondary | ICD-10-CM | POA: Diagnosis not present

## 2018-08-23 MED ORDER — CHLORPHENIRAMINE MALEATE 4 MG PO TABS: ORAL_TABLET | ORAL | 0 refills | Status: DC

## 2018-08-23 MED ORDER — SULFAMETHOXAZOLE-TRIMETHOPRIM 800-160 MG PO TABS: 1.0000 | ORAL_TABLET | Freq: Two times a day (BID) | ORAL | 0 refills | Status: AC

## 2018-08-23 MED ORDER — METHYLPREDNISOLONE ACETATE 80 MG/ML IJ SUSP
80.0000 mg | Freq: Once | INTRAMUSCULAR | Status: AC
  Administered 2018-08-23: 80 mg via INTRAMUSCULAR

## 2018-08-23 MED ORDER — PREDNISONE 10 MG (21) PO TBPK: ORAL_TABLET | ORAL | 0 refills | Status: DC

## 2018-08-23 MED ORDER — FLUCONAZOLE 150 MG PO TABS: ORAL_TABLET | ORAL | 0 refills | Status: DC

## 2018-08-23 NOTE — Progress Notes (Signed)
Sarah Coleman     MRN: [id number]      DOB: 04/17/1951   HPI Sarah Coleman is here 4 day h/o progressively worsening facial pressure, intermittent chills and fever, with ear pressure and headache. Nasal drainage is thick and green and  foul tasting thick p at times blood tinged. C/o excessive watery eyes, sneezing and coughing    ROS . Denies chest pains, palpitations and leg swelling Denies abdominal pain, nausea, vomiting,diarrhea or constipation.   Denies dysuria, frequency, hesitancy or incontinence. Chronic joint pain, swelling and limitation in mobility. Denies headaches, seizures, numbness, or tingling. Denies depression, anxiety or insomnia. Denies skin break down or rash.   PE  BP 140/82   Pulse 82   Temp 98 F (36.7 C) (Temporal)   Resp 15   Ht 5\' 6"  (1.676 m)   Wt 227 lb (103 kg)   SpO2 98%   BMI 36.64 kg/m   Patient alert and oriented and in no cardiopulmonary distress.  HEENT: No facial asymmetry, EOMI,   oropharynx pink and moist.  Neck supple no JVD, bilateral anterior cervical adenitis, maxillary sinus tenderness, bilateral and  erythema and edema of nasal mucosa.  Chest: decreased air entry , though adequate , scattered crackles and wheezes  CVS: S1, S2 no murmurs, no S3.Regular rate.  ABD: Soft non tender.   Ext: No edema  MS: Decreased  ROM spine, shoulders, hips and knees.  Skin: Intact, no ulcerations or rash noted.  Psych: Good eye contact, normal affect. Memory intact not anxious or depressed appearing.  CNS: CN 2-12 intact, power,  normal throughout.no focal deficits noted.   Assessment & Plan  Allergic rhinitis Uncontrolled , depo medrol , and chlorpheniramine asded, continue chroic medications and take daily as directed  Acute sinusitis Antibiotic precribed and saline nasal flushes twice daily recommended  Essential hypertension UnControlled, no change in medication, has been using an excess of OTC decongestants DASH diet and  commitment to daily physical activity for a minimum of 30 minutes discussed and encouraged, as a part of hypertension management. The importance of attaining a healthy weight is also discussed.  BP/Weight 08/23/2018 05/31/2018 05/31/2018 04/24/2018 04/18/2018 02/21/2018 01/09/7823  Systolic BP 235 361 443 154 008 676 195  Diastolic BP 82 80 81 80 76 80 71  Wt. (Lbs) 227 224.12 221.8 221 221.04 221 224.6  BMI 36.64 36.17 35.8 35.67 35.68 35.67 36.25       Morbid obesity ([facility]) Obesity linked  with hypertension and hyperlipidemia and arthritis Deteriorated. Patient re-educated about  the importance of commitment to a  minimum of 150 minutes of exercise per week.  The importance of healthy food choices with portion control discussed. Encouraged to start a food diary, count calories and to consider  joining a support group. Sample diet sheets offered. Goals set by the patient for the next several months.   Weight /BMI 08/23/2018 05/31/2018 05/31/2018  WEIGHT 227 lb 224 lb 1.9 oz 221 lb 12.8 oz  HEIGHT 5\' 6"  5\' 6"  5\' 6"   BMI 36.64 kg/m2 36.17 kg/m2 35.8 kg/m2      Hyperlipidemia LDL goal <100 Hyperlipidemia:Low fat diet discussed and encouraged.   Lipid Panel  Lab Results  Component Value Date   CHOL 172 05/31/2018   HDL 33 (L) 05/31/2018   LDLCALC  05/31/2018     Comment:     . LDL cholesterol not calculated. Triglyceride levels greater than 400 mg/dL invalidate calculated LDL results. . Reference range: <100 . Desirable  range <100 mg/dL for primary prevention;   <70 mg/dL for patients with CHD or diabetic patients  with > or = 2 CHD risk factors. Marland Kitchen LDL-C is now calculated using the Martin-Hopkins  calculation, which is a validated novel method providing  better accuracy than the Friedewald equation in the  estimation of LDL-C.  Cresenciano Genre et al. Annamaria Helling. 0258;527(78): 2061-2068  (http://education.[facility].com/faq/FAQ164)    LDLDIRECT 69 02/13/2009   TRIG 486 (H)  05/31/2018   CHOLHDL 5.2 (H) 05/31/2018   uncontroled at increased risk of CAD Updated lab needed at/ before next visit.

## 2018-08-23 NOTE — Patient Instructions (Signed)
F/u as before, call if you need me sooner  Depo Medrol 80 mg in office today for uncontrolled allergies  Medication sent in for uncontrolled allergies and sinus infection  Drink a lot fluid and take meds as prescribed

## 2018-08-23 NOTE — Assessment & Plan Note (Addendum)
Uncontrolled , depo medrol , and chlorpheniramine asded, continue chroic medications and take daily as directed

## 2018-08-29 ENCOUNTER — Other Ambulatory Visit: Payer: Self-pay | Admitting: Family Medicine

## 2018-08-30 ENCOUNTER — Other Ambulatory Visit: Payer: Self-pay | Admitting: Family Medicine

## 2018-09-01 ENCOUNTER — Encounter: Payer: Self-pay | Admitting: Family Medicine

## 2018-09-01 NOTE — Assessment & Plan Note (Addendum)
Obesity linked  with hypertension and hyperlipidemia and arthritis Deteriorated. Patient re-educated about  the importance of commitment to a  minimum of 150 minutes of exercise per week.  The importance of healthy food choices with portion control discussed. Encouraged to start a food diary, count calories and to consider  joining a support group. Sample diet sheets offered. Goals set by the patient for the next several months.   Weight /BMI 08/23/2018 05/31/2018 05/31/2018  WEIGHT 227 lb 224 lb 1.9 oz 221 lb 12.8 oz  HEIGHT 5\' 6"  5\' 6"  5\' 6"   BMI 36.64 kg/m2 36.17 kg/m2 35.8 kg/m2

## 2018-09-01 NOTE — Assessment & Plan Note (Signed)
Hyperlipidemia:Low fat diet discussed and encouraged.   Lipid Panel  Lab Results  Component Value Date   CHOL 172 05/31/2018   HDL 33 (L) 05/31/2018   LDLCALC  05/31/2018     Comment:     . LDL cholesterol not calculated. Triglyceride levels greater than 400 mg/dL invalidate calculated LDL results. . Reference range: <100 . Desirable range <100 mg/dL for primary prevention;   <70 mg/dL for patients with CHD or diabetic patients  with > or = 2 CHD risk factors. Marland Kitchen LDL-C is now calculated using the Martin-Hopkins  calculation, which is a validated novel method providing  better accuracy than the Friedewald equation in the  estimation of LDL-C.  Cresenciano Genre et al. Annamaria Helling. 0973;532(99): 2061-2068  (http://education.[facility].com/faq/FAQ164)    LDLDIRECT 69 02/13/2009   TRIG 486 (H) 05/31/2018   CHOLHDL 5.2 (H) 05/31/2018   uncontroled at increased risk of CAD Updated lab needed at/ before next visit.

## 2018-09-01 NOTE — Assessment & Plan Note (Signed)
The increased risk of cardiovascular disease associated with this diagnosis, and the need to consistently work on lifestyle to change this is discussed. Following  a  heart healthy diet ,commitment to 30 minutes of exercise at least 5 days per week, as well as control of blood sugar and cholesterol , and achieving a healthy weight are all the areas to be addressed .  

## 2018-09-01 NOTE — Assessment & Plan Note (Signed)
UnControlled, no change in medication, has been using an excess of OTC decongestants DASH diet and commitment to daily physical activity for a minimum of 30 minutes discussed and encouraged, as a part of hypertension management. The importance of attaining a healthy weight is also discussed.  BP/Weight 08/23/2018 05/31/2018 05/31/2018 04/24/2018 04/18/2018 02/21/2018 1/44/3601  Systolic BP 658 006 349 494 473 958 441  Diastolic BP 82 80 81 80 76 80 71  Wt. (Lbs) 227 224.12 221.8 221 221.04 221 224.6  BMI 36.64 36.17 35.8 35.67 35.68 35.67 36.25

## 2018-09-01 NOTE — Assessment & Plan Note (Signed)
Antibiotic precribed and saline nasal flushes twice daily recommended

## 2018-09-10 ENCOUNTER — Other Ambulatory Visit: Payer: Self-pay | Admitting: Family Medicine

## 2018-09-11 ENCOUNTER — Ambulatory Visit: Payer: PPO | Admitting: Urology

## 2018-09-11 DIAGNOSIS — N952 Postmenopausal atrophic vaginitis: Secondary | ICD-10-CM

## 2018-09-13 ENCOUNTER — Ambulatory Visit: Payer: PPO | Admitting: Family Medicine

## 2018-09-21 ENCOUNTER — Telehealth: Payer: Self-pay

## 2018-09-21 DIAGNOSIS — E785 Hyperlipidemia, unspecified: Secondary | ICD-10-CM | POA: Diagnosis not present

## 2018-09-21 DIAGNOSIS — I10 Essential (primary) hypertension: Secondary | ICD-10-CM | POA: Diagnosis not present

## 2018-09-21 DIAGNOSIS — R7303 Prediabetes: Secondary | ICD-10-CM

## 2018-09-21 NOTE — Telephone Encounter (Signed)
Lab work ordered , patient request, was waiting in lobby

## 2018-09-22 LAB — LIPID PANEL
CHOL/HDL RATIO: 4 (calc) (ref <5.0)
Cholesterol: 159 mg/dL (ref <200)
HDL: 40 mg/dL — AB (ref >=50)
LDL Cholesterol (Calc): 88 mg/dL (calc)
NON-HDL CHOLESTEROL (CALC): 119 mg/dL (ref <130)
Triglycerides: 221 mg/dL — ABNORMAL HIGH (ref <150)

## 2018-09-22 LAB — BASIC METABOLIC PANEL WITH GFR
BUN/Creatinine Ratio: 12 (calc) (ref 6–22)
BUN: 12 mg/dL (ref 7–25)
CALCIUM: 9.8 mg/dL (ref 8.6–10.4)
CHLORIDE: 102 mmol/L (ref 98–110)
CO2: 26 mmol/L (ref 20–32)
Creat: 1 mg/dL — ABNORMAL HIGH (ref 0.50–0.99)
GFR, EST AFRICAN AMERICAN: 67 mL/min/{1.73_m2} (ref >=60)
GFR, Est Non African American: 58 mL/min/{1.73_m2} — ABNORMAL LOW (ref >=60)
GLUCOSE: 96 mg/dL (ref 65–99)
POTASSIUM: 3.7 mmol/L (ref 3.5–5.3)
Sodium: 137 mmol/L (ref 135–146)

## 2018-09-22 LAB — HEMOGLOBIN A1C
Hgb A1c MFr Bld: 6.4 % of total Hgb — ABNORMAL HIGH (ref <5.7)
Mean Plasma Glucose: 137 (calc)
eAG (mmol/L): 7.6 (calc)

## 2018-09-22 LAB — CBC
HCT: 35.8 % (ref 35.0–45.0)
HEMOGLOBIN: 12.5 g/dL (ref 11.7–15.5)
MCH: 31.6 pg (ref 27.0–33.0)
MCHC: 34.9 g/dL (ref 32.0–36.0)
MCV: 90.4 fL (ref 80.0–100.0)
MPV: 10.3 fL (ref 7.5–12.5)
PLATELETS: 282 10*3/uL (ref 140–400)
RBC: 3.96 10*6/uL (ref 3.80–5.10)
RDW: 14.4 % (ref 11.0–15.0)
WBC: 5.1 10*3/uL (ref 3.8–10.8)

## 2018-09-26 ENCOUNTER — Ambulatory Visit (INDEPENDENT_AMBULATORY_CARE_PROVIDER_SITE_OTHER): Payer: PPO | Admitting: Family Medicine

## 2018-09-26 ENCOUNTER — Encounter: Payer: Self-pay | Admitting: Family Medicine

## 2018-09-26 VITALS — BP 120/78 | HR 77 | Resp 14 | Ht 66.0 in | Wt 229.0 lb

## 2018-09-26 DIAGNOSIS — R7303 Prediabetes: Secondary | ICD-10-CM

## 2018-09-26 DIAGNOSIS — E8881 Metabolic syndrome: Secondary | ICD-10-CM

## 2018-09-26 DIAGNOSIS — M549 Dorsalgia, unspecified: Secondary | ICD-10-CM

## 2018-09-26 DIAGNOSIS — J309 Allergic rhinitis, unspecified: Secondary | ICD-10-CM

## 2018-09-26 DIAGNOSIS — E785 Hyperlipidemia, unspecified: Secondary | ICD-10-CM | POA: Diagnosis not present

## 2018-09-26 DIAGNOSIS — I10 Essential (primary) hypertension: Secondary | ICD-10-CM

## 2018-09-26 MED ORDER — LOVASTATIN 40 MG PO TABS: 40.0000 mg | ORAL_TABLET | Freq: Every day | ORAL | 3 refills | Status: DC

## 2018-09-26 MED ORDER — LORATADINE 10 MG PO TABS: ORAL_TABLET | ORAL | 3 refills | Status: DC

## 2018-09-26 MED ORDER — FLUTICASONE PROPIONATE 50 MCG/ACT NA SUSP: 2.0000 | Freq: Every day | NASAL | 11 refills | Status: DC

## 2018-09-26 NOTE — Assessment & Plan Note (Signed)
Controlled, no change in medication DASH diet and commitment to daily physical activity for a minimum of 30 minutes discussed and encouraged, as a part of hypertension management. The importance of attaining a healthy weight is also discussed.  BP/Weight 09/26/2018 08/23/2018 05/31/2018 05/31/2018 04/24/2018 04/18/2018 8/75/7972  Systolic BP 820 601 561 537 943 276 147  Diastolic BP 78 82 80 81 80 76 80  Wt. (Lbs) 229 227 224.12 221.8 221 221.04 221  BMI 36.96 36.64 36.17 35.8 35.67 35.68 35.67

## 2018-09-26 NOTE — Assessment & Plan Note (Signed)
The increased risk of cardiovascular disease associated with this diagnosis, and the need to consistently work on lifestyle to change this is discussed. Following  a  heart healthy diet ,commitment to 30 minutes of exercise at least 5 days per week, as well as control of blood sugar and cholesterol , and achieving a healthy weight are all the areas to be addressed .  

## 2018-09-26 NOTE — Assessment & Plan Note (Signed)
Controlled, no change in medication  

## 2018-09-26 NOTE — Assessment & Plan Note (Signed)
No current flare 

## 2018-09-26 NOTE — Assessment & Plan Note (Signed)
Deteriorated.Obesity linked with hypertension, metabolic syndrome, arthritis  Patient re-educated about  the importance of commitment to a  minimum of 150 minutes of exercise per week as able.  The importance of healthy food choices with portion control discussed, as well as eating regularly and within a 12 hour window most days. The need to choose "clean , green" food 50 to 75% of the time is discussed, as well as to make water the primary drink and set a goal of 64 ounces water daily.  Encouraged to start a food diary,  and to consider  joining a support group. Sample diet sheets offered. Goals set by the patient for the next several months.   Weight /BMI 09/26/2018 08/23/2018 05/31/2018  WEIGHT 229 lb 227 lb 224 lb 1.9 oz  HEIGHT 5\' 6"  5\' 6"  5\' 6"   BMI 36.96 kg/m2 36.64 kg/m2 36.17 kg/m2

## 2018-09-26 NOTE — Assessment & Plan Note (Signed)
Uncontrolled, not at goal Hyperlipidemia:Low fat diet discussed and encouraged.   Lipid Panel  Lab Results  Component Value Date   CHOL 159 09/21/2018   HDL 40 (L) 09/21/2018   LDLCALC 88 09/21/2018   LDLDIRECT 69 02/13/2009   TRIG 221 (H) 09/21/2018   CHOLHDL 4.0 09/21/2018     Increase medication dose

## 2018-09-26 NOTE — Progress Notes (Signed)
Sarah Coleman     MRN: [id number]      DOB: 1950-10-17   HPI Sarah Coleman is here for follow up and re-evaluation of chronic medical conditions, medication management and review of any available recent lab and radiology data.  Preventive health is updated, specifically  Cancer screening and Immunization.   Questions or concerns regarding consultations or procedures which the PT has had in the interim are  addressed. The PT denies any adverse reactions to current medications since the last visit.  C/o weight gain and lack of exercise   ROS Denies recent fever or chills. Denies sinus pressure, nasal congestion, ear pain or sore throat. Denies chest congestion, productive cough or wheezing. Denies chest pains, palpitations and leg swelling Denies abdominal pain, nausea, vomiting,diarrhea or constipation.   Denies dysuria, frequency, hesitancy or incontinence. Denies joint pain, swelling and limitation in mobility. Denies headaches, seizures, numbness, or tingling. Denies depression, anxiety or insomnia. Denies skin break down or rash.   PE  BP 120/78   Pulse 77   Resp 14   Ht 5\' 6"  (1.676 m)   Wt 229 lb (103.9 kg)   SpO2 97%   BMI 36.96 kg/m   Patient alert and oriented and in no cardiopulmonary distress.  HEENT: No facial asymmetry, EOMI,   oropharynx pink and moist.  Neck supple no JVD, no mass.  Chest: Clear to auscultation bilaterally.  CVS: S1, S2 no murmurs, no S3.Regular rate.  ABD: Soft non tender.   Ext: No edema  MS: Adequate ROM spine, shoulders, hips and knees.  Skin: Intact, no ulcerations or rash noted.  Psych: Good eye contact, normal affect. Memory intact not anxious or depressed appearing.  CNS: CN 2-12 intact, power,  normal throughout.no focal deficits noted.   Assessment & Plan  Essential hypertension Controlled, no change in medication DASH diet and commitment to daily physical activity for a minimum of 30 minutes discussed and encouraged,  as a part of hypertension management. The importance of attaining a healthy weight is also discussed.  BP/Weight 09/26/2018 08/23/2018 05/31/2018 05/31/2018 04/24/2018 04/18/2018 8/36/6294  Systolic BP 765 465 035 465 681 275 170  Diastolic BP 78 82 80 81 80 76 80  Wt. (Lbs) 229 227 224.12 221.8 221 221.04 221  BMI 36.96 36.64 36.17 35.8 35.67 35.68 35.67       Hyperlipidemia LDL goal <100 Uncontrolled, not at goal Hyperlipidemia:Low fat diet discussed and encouraged.   Lipid Panel  Lab Results  Component Value Date   CHOL 159 09/21/2018   HDL 40 (L) 09/21/2018   LDLCALC 88 09/21/2018   LDLDIRECT 69 02/13/2009   TRIG 221 (H) 09/21/2018   CHOLHDL 4.0 09/21/2018     Increase medication dose  Back pain with radiation No current flare  Morbid obesity (HCC) Deteriorated.Obesity linked with hypertension, metabolic syndrome, arthritis  Patient re-educated about  the importance of commitment to a  minimum of 150 minutes of exercise per week as able.  The importance of healthy food choices with portion control discussed, as well as eating regularly and within a 12 hour window most days. The need to choose "clean , green" food 50 to 75% of the time is discussed, as well as to make water the primary drink and set a goal of 64 ounces water daily.  Encouraged to start a food diary,  and to consider  joining a support group. Sample diet sheets offered. Goals set by the patient for the next several months.  Weight /BMI 09/26/2018 08/23/2018 05/31/2018  WEIGHT 229 lb 227 lb 224 lb 1.9 oz  HEIGHT 5\' 6"  5\' 6"  5\' 6"   BMI 36.96 kg/m2 36.64 kg/m2 36.17 kg/m2      Metabolic syndrome X The increased risk of cardiovascular disease associated with this diagnosis, and the need to consistently work on lifestyle to change this is discussed. Following  a  heart healthy diet ,commitment to 30 minutes of exercise at least 5 days per week, as well as control of blood sugar and cholesterol , and  achieving a healthy weight are all the areas to be addressed .   Allergic rhinitis Controlled, no change in medication

## 2018-09-26 NOTE — Patient Instructions (Signed)
F/U with MD end June, call if you need me  before  New higher dose of lovastatin is 40 mg daily, OK tot take TWO 20 mg tabs daily till done  Blood sugar is nearly diabetic, so NEED to CHANGE   Fasting lipid, cmp and EGFR, and HBA1C 1 week before nwext MD visit in June  Please increase "natural foods" and reduce processed foods and sugar( Sweet is to be eaten in fruit)`  It is important that you exercise regularly at least 30 minutes 5 times a week. If you develop chest pain, have severe difficulty breathing, or feel very tired, stop exercising immediately and seek medical attention    Think about what you will eat, plan ahead. Choose " clean, green, fresh or frozen" over canned, processed or packaged foods which are more sugary, salty and fatty. 70 to 75% of food eaten should be vegetables and fruit. Three meals at set times with snacks allowed between meals, but they must be fruit or vegetables. Aim to eat over a 12 hour period , example 7 am to 7 pm, and STOP after  your last meal of the day. Drink water,generally about 64 ounces per day, no other drink is as healthy. Fruit juice is best enjoyed in a healthy way, by EATING the fruit.  Thank you  for choosing [facility]. We consider it a privelige to serve you.  Delivering excellent health care in a caring and  compassionate way is our goal.  Partnering with you,  so that together we can achieve this goal is our strategy.

## 2018-09-27 ENCOUNTER — Telehealth: Payer: Self-pay | Admitting: *Deleted

## 2018-09-27 NOTE — Telephone Encounter (Signed)
Pt called stated when she was in to see Dr. Moshe Cipro she told her about seeing spots when she turns her head. She stated she is still seeing the spots and wanted to know if Dr. Moshe Cipro would call in some prednisone or something to help this.

## 2018-09-27 NOTE — Telephone Encounter (Signed)
Please advise 

## 2018-09-28 ENCOUNTER — Other Ambulatory Visit: Payer: Self-pay | Admitting: Family Medicine

## 2018-09-28 MED ORDER — PREDNISONE 5 MG PO TABS: 5.0000 mg | ORAL_TABLET | Freq: Two times a day (BID) | ORAL | 0 refills | Status: AC

## 2018-09-28 MED ORDER — BUTALBITAL-APAP-CAFFEINE 50-325-40 MG PO TABS: ORAL_TABLET | ORAL | 0 refills | Status: DC

## 2018-09-28 MED ORDER — AZITHROMYCIN 250 MG PO TABS: ORAL_TABLET | ORAL | 0 refills | Status: DC

## 2018-09-28 NOTE — Telephone Encounter (Signed)
I spoke directly with the patient , a few minutes ago. I have sent electronically the prednisone and  azithromycin for ear pain and headache. The fioricet is printed , thois is for headache , pls stamp and fax also, thanks

## 2018-09-28 NOTE — Telephone Encounter (Signed)
Has been stamped and faxed to [facility]

## 2018-09-28 NOTE — Telephone Encounter (Signed)
Thank you :)

## 2018-09-28 NOTE — Telephone Encounter (Signed)
Pt is calling in --she is not sleeping, her head is hurting, and tylenol is not helping ---

## 2018-10-05 ENCOUNTER — Telehealth: Payer: Self-pay | Admitting: *Deleted

## 2018-10-05 ENCOUNTER — Other Ambulatory Visit: Payer: Self-pay | Admitting: Family Medicine

## 2018-10-05 MED ORDER — FLUCONAZOLE 150 MG PO TABS: ORAL_TABLET | ORAL | 0 refills | Status: DC

## 2018-10-05 NOTE — Telephone Encounter (Signed)
I spoke to pt , she is aware that medication is sent in

## 2018-10-05 NOTE — Telephone Encounter (Signed)
Pt called needing something called in for a yeast infection. She stated that she was on antibiotics but they didn't give her anything for the yeast. Stated the yeast symptoms started yesterday. This can be called in to [facility] in [place]

## 2018-11-13 ENCOUNTER — Encounter: Payer: Self-pay | Admitting: *Deleted

## 2018-11-29 ENCOUNTER — Other Ambulatory Visit: Payer: Self-pay

## 2018-11-29 ENCOUNTER — Ambulatory Visit: Payer: PPO

## 2018-11-29 ENCOUNTER — Encounter: Payer: Self-pay | Admitting: Family Medicine

## 2018-11-29 ENCOUNTER — Ambulatory Visit (INDEPENDENT_AMBULATORY_CARE_PROVIDER_SITE_OTHER): Payer: PPO | Admitting: Family Medicine

## 2018-11-29 DIAGNOSIS — Z0000 Encounter for general adult medical examination without abnormal findings: Secondary | ICD-10-CM | POA: Diagnosis not present

## 2018-11-29 NOTE — Progress Notes (Signed)
Subjective:   Sarah Coleman is a 68 y.o. female who presents for Medicare Annual (Subsequent) preventive examination.  Location of Patient: Home Location of Provider: Telehealth Consent was obtain for visit to be over via telehealth.  Review of Systems:   Cardiac Risk Factors include: advanced age (>58men, >61 women);hypertension;obesity (BMI >30kg/m2);sedentary lifestyle     Objective:     Vitals: There were no vitals taken for this visit.  There is no height or weight on file to calculate BMI.  Advanced Directives 11/29/2018 01/16/2018 11/27/2017 04/11/2017 02/09/2017 02/08/2017 02/08/2017  Does Patient Have a Medical Advance Directive? Yes No Yes Yes Yes No No  Type of Advance Directive Living will - - [place];Living will [place];Living will - -  Does patient want to make changes to medical advance directive? - - No - Patient declined - - - -  Copy of [place] in Chart? - - - No - copy requested Yes - -  Would patient like information on creating a medical advance directive? - No - Patient declined - - (No Data) Yes (Inpatient - patient requests chaplain consult to create a medical advance directive) -    Tobacco Social History   Tobacco Use  Smoking Status Former Smoker  . Packs/day: 1.00  . Years: 3.00  . Pack years: 3.00  . Last attempt to quit: 08/15/1974  . Years since quitting: 44.3  Smokeless Tobacco Never Used     Counseling given: Not Answered   Clinical Intake:  Pre-visit preparation completed: No  Pain : No/denies pain     Nutritional Status: BMI > 30  Obese Diabetes: No  How often do you need to have someone help you when you read instructions, pamphlets, or other written materials from your doctor or pharmacy?: 2 - Rarely What is the last grade level you completed in school?: 10  Interpreter Needed?: No     Past Medical History:  Diagnosis Date  . Allergic rhinitis   . Allergy   .  Anemia   . Arthritis   . GERD (gastroesophageal reflux disease) 2013  . Hyperlipidemia   . Hypertension   . Metabolic syndrome X 2025  . Morbid obesity ([place]) 2000  . Obesity   . Prediabetes 2010  . Seizures ([place])    as child; unknown etiology.   Past Surgical History:  Procedure Laterality Date  . BIOPSY  04/11/2017   Procedure: BIOPSY;  Surgeon: Danie Binder, MD;  [facility];  Service: Endoscopy;;  gastric bx's  . COLONOSCOPY N/A 10/07/2016   Procedure: COLONOSCOPY;  Surgeon: Danie Binder, MD;  [facility];  Service: Endoscopy;  Laterality: N/A;  9:30 AM  . COLONOSCOPY WITH PROPOFOL N/A 11/01/2016   Procedure: COLONOSCOPY WITH PROPOFOL;  Surgeon: Danie Binder, MD;  [facility];  Service: Endoscopy;  Laterality: N/A;  830   . COLONOSCOPY WITH PROPOFOL N/A 01/23/2018   Procedure: COLONOSCOPY WITH PROPOFOL;  Surgeon: Danie Binder, MD;  [facility];  Service: Endoscopy;  Laterality: N/A;  2:00pm - pt knows to arrive at 10:15  . ESOPHAGOGASTRODUODENOSCOPY (EGD) WITH PROPOFOL N/A 04/11/2017   Procedure: ESOPHAGOGASTRODUODENOSCOPY (EGD) WITH PROPOFOL;  Surgeon: Danie Binder, MD;  [facility];  Service: Endoscopy;  Laterality: N/A;  10:45am  . GERD    . PARTIAL HYSTERECTOMY  1987   secondary to ovarian cyst   . POLYPECTOMY  11/01/2016  Procedure: POLYPECTOMY;  Surgeon: Danie Binder, MD;  [facility];  Service: Endoscopy;;  colon  . POLYPECTOMY  01/23/2018   Procedure: POLYPECTOMY;  Surgeon: Danie Binder, MD;  [facility];  Service: Endoscopy;;  ascending colon  . SAVORY DILATION N/A 04/11/2017   Procedure: SAVORY DILATION;  Surgeon: Danie Binder, MD;  [facility];  Service: Endoscopy;  Laterality: N/A;  . TUBAL LIGATION  1975   Family History  Problem Relation Age of Onset  . Heart attack Mother   . Heart disease Mother 72       massive heart attack  . Prostate cancer  Father   . Colon cancer Father 15  . Hypertension Sister   . Ovarian cancer Sister   . Cancer Brother 55       oral   . Hypertension Brother   . Heart disease Brother    Social History   Socioeconomic History  . Marital status: Married    Spouse name: Not on file  . Number of children: 5  . Years of education: Not on file  . Highest education level: Not on file  Occupational History  . Occupation: retired   Scientific laboratory technician  . Financial resource strain: Not very hard  . Food insecurity:    Worry: Never true    Inability: Never true  . Transportation needs:    Medical: No    Non-medical: No  Tobacco Use  . Smoking status: Former Smoker    Packs/day: 1.00    Years: 3.00    Pack years: 3.00    Last attempt to quit: 08/15/1974    Years since quitting: 44.3  . Smokeless tobacco: Never Used  Substance and Sexual Activity  . Alcohol use: No    Alcohol/week: 0.0 standard drinks  . Drug use: No  . Sexual activity: Yes    Birth control/protection: Surgical  Lifestyle  . Physical activity:    Days per week: 0 days    Minutes per session: 0 min  . Stress: Not at all  Relationships  . Social connections:    Talks on phone: More than three times a week    Gets together: Never    Attends religious service: More than 4 times per year    Active member of club or organization: No    Attends meetings of clubs or organizations: Never    Relationship status: Married  Other Topics Concern  . Not on file  Social History Narrative  . Not on file    Outpatient Encounter Medications as of 11/29/2018  Medication Sig  . acetaminophen (TYLENOL) 500 MG tablet Take 1,000 mg by mouth 2 (two) times daily as needed for moderate pain or headache.  Marland Kitchen aspirin EC 81 MG tablet Take 81 mg by mouth at bedtime.   Marland Kitchen azelastine (ASTELIN) 0.1 % nasal spray USE 1 SPRAY(S) IN EACH NOSTRIL TWICE DAILY AS DIRECTED  . CALCIUM-MAGNESIUM-ZINC PO Take 1 tablet by mouth 2 (two) times daily.   .  carboxymethylcellulose (REFRESH PLUS) 0.5 % SOLN Place 1-2 drops into both eyes 3 (three) times daily as needed (for dry eyes).  . chlorpheniramine (CHLOR-TRIMETON) 4 MG tablet Take one tablet once daily as needed, for excess nasal drainage  . chlorthalidone (HYGROTON) 25 MG tablet TAKE 1 TABLET BY MOUTH ONCE DAILY ([place])  . estradiol (ESTRACE) 0.1 MG/GM vaginal cream USE 1 APPLICATORFUL VAGINALLY EVERY OTHER DAY AS NEEDED FOR  VAGINAL  DISCOMFORT  .  fluticasone (FLONASE) 50 MCG/ACT nasal spray Place 2 sprays into both nostrils daily.  Marland Kitchen loratadine (CLARITIN) 10 MG tablet Take two tablets once daily for allergies  . lovastatin (MEVACOR) 40 MG tablet Take 1 tablet (40 mg total) by mouth at bedtime.  . meclizine (ANTIVERT) 12.5 MG tablet Take 1 tablet (12.5 mg total) by mouth 3 (three) times daily as needed for dizziness.  . montelukast (SINGULAIR) 10 MG tablet Take 1 tablet (10 mg total) by mouth at bedtime.  . Omega-3 300 MG CAPS Take 1,200 mg by mouth daily.  . potassium chloride SA (K-DUR,KLOR-CON) 20 MEQ tablet Take two tablets by mouth  three times daily  . spironolactone (ALDACTONE) 25 MG tablet TAKE 1/2 (ONE-HALF) TABLET BY MOUTH ONCE DAILY  . nitroGLYCERIN (NITROSTAT) 0.4 MG SL tablet Place 1 tablet (0.4 mg total) under the tongue every 5 (five) minutes as needed for chest pain (hold for SBP < 110). (Patient not taking: Reported on 11/29/2018)  . [DISCONTINUED] azithromycin (ZITHROMAX) 250 MG tablet Take two tablets on day one, then take one tablet once daily for an additional 4 days  . [DISCONTINUED] butalbital-acetaminophen-caffeine (FIORICET, ESGIC) 50-325-40 MG tablet Take one tablet two times daily, as needed, for uncontrolled headache Maximum of 4 tablets per week  . [DISCONTINUED] fluconazole (DIFLUCAN) 150 MG tablet Take one tablet once daily for vaginal itch  . [DISCONTINUED] pseudoephedrine (SUDAFED) 30 MG tablet Take 60 mg by mouth every 6 (six) hours as needed for  congestion.   No facility-administered encounter medications on file as of 11/29/2018.     Activities of Daily Living In your present state of health, do you have any difficulty performing the following activities: 11/29/2018 01/16/2018  Hearing? N N  Vision? N N  Difficulty concentrating or making decisions? Y N  Comment sometimes  -  Walking or climbing stairs? N N  Dressing or bathing? N N  Doing errands, shopping? N N  Preparing Food and eating ? N -  Using the Toilet? N -  In the past six months, have you accidently leaked urine? N -  Do you have problems with loss of bowel control? N -  Managing your Medications? N -  Managing your Finances? N -  Housekeeping or managing your Housekeeping? N -  Some recent data might be hidden    Patient Care Team: Fayrene Helper, MD as PCP - General Franchot Gallo, MD as Consulting Physician (Urology) Harl Bowie, Alphonse Guild, MD as Consulting Physician (Cardiology) Danie Binder, MD as Consulting Physician (Gastroenterology)    Assessment:   This is a routine wellness examination for [place].  Exercise Activities and Dietary recommendations Current Exercise Habits: The patient does not participate in regular exercise at present, Exercise limited by: cardiac condition(s);orthopedic condition(s)  Goals    . Exercise 3x per week (30 min per time)     Recommend starting an exercise program 3 times a week at least 30 minutes at a time.    . Increase water intake       Fall Risk Fall Risk  11/29/2018 09/26/2018 05/31/2018 04/18/2018 11/27/2017  Falls in the past year? 0 0 No No No  Number falls in past yr: 0 0 - - -  Injury with Fall? 0 0 - - -  Follow up Falls evaluation completed - - - -   Is the patient's home free of loose throw rugs in walkways, pet beds, electrical cords, etc?   yes      Grab bars in  the bathroom? no      Handrails on the stairs?   yes      Adequate lighting?   yes  Timed Get Up and Go performed: unable  to assess  Depression Screen PHQ 2/9 Scores 11/29/2018 09/26/2018 05/31/2018 04/24/2018  PHQ - 2 Score 0 0 0 0  PHQ- 9 Score - 1 - -     Cognitive Function     6CIT Screen 11/29/2018 11/27/2017 09/21/2016  What Year? 0 points 0 points 0 points  What month? 0 points 0 points 0 points  What time? 0 points 0 points 0 points  Count back from 20 0 points 0 points 0 points  Months in reverse 0 points 0 points 0 points  Repeat phrase 2 points 2 points 0 points  Total Score 2 2 0    Immunization History  Administered Date(s) Administered  . Influenza Whole 05/08/2007  . Influenza,inj,Quad PF,6+ Mos 04/23/2013, 07/28/2014, 05/14/2015, 04/28/2016, 05/09/2017, 05/31/2018  . Pneumococcal Conjugate-13 03/26/2014  . Pneumococcal Polysaccharide-23 02/09/2016  . Td 05/09/2006  . Zoster 11/01/2012    Qualifies for Shingles Vaccine? Already received  Screening Tests Health Maintenance  Topic Date Due  . TETANUS/TDAP  05/09/2016  . INFLUENZA VACCINE  03/16/2019  . MAMMOGRAM  08/04/2019  . COLONOSCOPY  01/24/2028  . DEXA SCAN  Completed  . Hepatitis C Screening  Completed  . PNA vac Low Risk Adult  Completed    Cancer Screenings: Lung: Low Dose CT Chest recommended if Age 69-80 years, 30 pack-year currently smoking OR have quit w/in 15years. Patient does not qualify. Breast:  Up to date on Mammogram? No  Needs this year Up to date of Bone Density/Dexa? No Colorectal: due next   Additional Screenings:  Hepatitis C Screening: completed     Plan:    1. Encounter for Medicare annual wellness exam  I have personally reviewed and noted the following in the patient's chart:   . Medical and social history . Use of alcohol, tobacco or illicit drugs  . Current medications and supplements . Functional ability and status . Nutritional status . Physical activity . Advanced directives . List of other physicians . Hospitalizations, surgeries, and ER visits in previous 12 months . Vitals .  Screenings to include cognitive, depression, and falls . Referrals and appointments  In addition, I have reviewed and discussed with patient certain preventive protocols, quality metrics, and best practice recommendations. A written personalized care plan for preventive services as well as general preventive health recommendations were provided to patient.   I provided 20 minutes of non-face-to-face time during this encounter.   Perlie Mayo, NP  11/29/2018

## 2018-11-29 NOTE — Patient Instructions (Signed)
Thank you for completing your office annual wellness visit today via telephone. I appreciate the opportunity to provide you with the care for your health and wellness. Today we discussed: Have attached some preventative information for you to review.  [place] YOUR HANDS WELL AND FREQUENTLY. AVOID TOUCHING YOUR FACE, UNLESS YOUR HANDS ARE FRESHLY WASHED.  GET FRESH AIR DAILY. STAY HYDRATED WITH WATER.   It was a pleasure to see you and I look forward to continuing to work together on your health and well-being. Please do not hesitate to call the office if you need care or have questions about your care.  Have a wonderful day and week. With Gratitude, Cherly Beach, DNP, AGNP-BC  [name] , Thank you for taking time to come for your Medicare Wellness Visit. I appreciate your ongoing commitment to your health goals. Please review the following plan we discussed and let me know if I can assist you in the future.   Screening recommendations/referrals: Colonoscopy: Due next year 2021 Mammogram: Due this year 2020 Bone Density: Due for this, talk with Dr Moshe Cipro at next visit Recommended yearly ophthalmology/optometry visit for glaucoma screening and checkup Recommended yearly dental visit for hygiene and checkup   Preventive Care 65 Years and Older, Female Preventive care refers to lifestyle choices and visits with your health care provider that can promote health and wellness. What does preventive care include?  A yearly physical exam. This is also called an annual well check.  Dental exams once or twice a year.  Routine eye exams. Ask your health care provider how often you should have your eyes checked.  Personal lifestyle choices, including:  Daily care of your teeth and gums.  Regular physical activity.  Eating a healthy diet.  Avoiding tobacco and drug use.  Limiting alcohol use.  Practicing safe sex.  Taking low-dose aspirin every day.  Taking vitamin and  mineral supplements as recommended by your health care provider. What happens during an annual well check? The services and screenings done by your health care provider during your annual well check will depend on your age, overall health, lifestyle risk factors, and family history of disease. Counseling  Your health care provider may ask you questions about your:  Alcohol use.  Tobacco use.  Drug use.  Emotional well-being.  Home and relationship well-being.  Sexual activity.  Eating habits.  History of falls.  Memory and ability to understand (cognition).  Work and work Statistician.  Reproductive health. Screening  You may have the following tests or measurements:  Height, weight, and BMI.  Blood pressure.  Lipid and cholesterol levels. These may be checked every 5 years, or more frequently if you are over 52 years old.  Skin check.  Lung cancer screening. You may have this screening every year starting at age 68 if you have a 30-pack-year history of smoking and currently smoke or have quit within the past 15 years.  Fecal occult blood test (FOBT) of the stool. You may have this test every year starting at age 81.  Flexible sigmoidoscopy or colonoscopy. You may have a sigmoidoscopy every 5 years or a colonoscopy every 10 years starting at age 60.  Hepatitis C blood test.  Hepatitis B blood test.  Sexually transmitted disease (STD) testing.  Diabetes screening. This is done by checking your blood sugar (glucose) after you have not eaten for a while (fasting). You may have this done every 1-3 years.  Bone density scan. This is done to screen  for osteoporosis. You may have this done starting at age 69.  Mammogram. This may be done every 1-2 years. Talk to your health care provider about how often you should have regular mammograms. Talk with your health care provider about your test results, treatment options, and if necessary, the need for more tests. Vaccines   Your health care provider may recommend certain vaccines, such as:  Influenza vaccine. This is recommended every year.  Tetanus, diphtheria, and acellular pertussis (Tdap, Td) vaccine. You may need a Td booster every 10 years.  Zoster vaccine. You may need this after age 9.  Pneumococcal 13-valent conjugate (PCV13) vaccine. One dose is recommended after age 30.  Pneumococcal polysaccharide (PPSV23) vaccine. One dose is recommended after age 25. Talk to your health care provider about which screenings and vaccines you need and how often you need them. This information is not intended to replace advice given to you by your health care provider. Make sure you discuss any questions you have with your health care provider. Document Released: 08/28/2015 Document Revised: 04/20/2016 Document Reviewed: 06/02/2015 Elsevier Interactive Patient Education  2017 [employer] Prevention in the Home Falls can cause injuries. They can happen to people of all ages. There are many things you can do to make your home safe and to help prevent falls. What can I do on the outside of my home?  Regularly fix the edges of walkways and driveways and fix any cracks.  Remove anything that might make you trip as you walk through a door, such as a raised step or threshold.  Trim any bushes or trees on the path to your home.  Use bright outdoor lighting.  Clear any walking paths of anything that might make someone trip, such as rocks or tools.  Regularly check to see if handrails are loose or broken. Make sure that both sides of any steps have handrails.  Any raised decks and porches should have guardrails on the edges.  Have any leaves, snow, or ice cleared regularly.  Use sand or salt on walking paths during winter.  Clean up any spills in your garage right away. This includes oil or grease spills. What can I do in the bathroom?  Use night lights.  Install grab bars by the toilet and in the  tub and shower. Do not use towel bars as grab bars.  Use non-skid mats or decals in the tub or shower.  If you need to sit down in the shower, use a plastic, non-slip stool.  Keep the floor dry. Clean up any water that spills on the floor as soon as it happens.  Remove soap buildup in the tub or shower regularly.  Attach bath mats securely with double-sided non-slip rug tape.  Do not have throw rugs and other things on the floor that can make you trip. What can I do in the bedroom?  Use night lights.  Make sure that you have a light by your bed that is easy to reach.  Do not use any sheets or blankets that are too big for your bed. They should not hang down onto the floor.  Have a firm chair that has side arms. You can use this for support while you get dressed.  Do not have throw rugs and other things on the floor that can make you trip. What can I do in the kitchen?  Clean up any spills right away.  Avoid walking on wet floors.  Keep items that you  use a lot in easy-to-reach places.  If you need to reach something above you, use a strong step stool that has a grab bar.  Keep electrical cords out of the way.  Do not use floor polish or wax that makes floors slippery. If you must use wax, use non-skid floor wax.  Do not have throw rugs and other things on the floor that can make you trip. What can I do with my stairs?  Do not leave any items on the stairs.  Make sure that there are handrails on both sides of the stairs and use them. Fix handrails that are broken or loose. Make sure that handrails are as long as the stairways.  Check any carpeting to make sure that it is firmly attached to the stairs. Fix any carpet that is loose or worn.  Avoid having throw rugs at the top or bottom of the stairs. If you do have throw rugs, attach them to the floor with carpet tape.  Make sure that you have a light switch at the top of the stairs and the bottom of the stairs. If you  do not have them, ask someone to add them for you. What else can I do to help prevent falls?  Wear shoes that:  Do not have high heels.  Have rubber bottoms.  Are comfortable and fit you well.  Are closed at the toe. Do not wear sandals.  If you use a stepladder:  Make sure that it is fully opened. Do not climb a closed stepladder.  Make sure that both sides of the stepladder are locked into place.  Ask someone to hold it for you, if possible.  Clearly mark and make sure that you can see:  Any grab bars or handrails.  First and last steps.  Where the edge of each step is.  Use tools that help you move around (mobility aids) if they are needed. These include:  Canes.  Walkers.  Scooters.  Crutches.  Turn on the lights when you go into a dark area. Replace any light bulbs as soon as they burn out.  Set up your furniture so you have a clear path. Avoid moving your furniture around.  If any of your floors are uneven, fix them.  If there are any pets around you, be aware of where they are.  Review your medicines with your doctor. Some medicines can make you feel dizzy. This can increase your chance of falling. Ask your doctor what other things that you can do to help prevent falls. This information is not intended to replace advice given to you by your health care provider. Make sure you discuss any questions you have with your health care provider. Document Released: 05/28/2009 Document Revised: 01/07/2016 Document Reviewed: 09/05/2014 Elsevier Interactive Patient Education  2017 [employer].

## 2018-11-30 ENCOUNTER — Ambulatory Visit: Payer: PPO

## 2018-12-03 ENCOUNTER — Ambulatory Visit: Payer: PPO

## 2018-12-11 ENCOUNTER — Ambulatory Visit: Payer: PPO | Admitting: Family Medicine

## 2018-12-11 ENCOUNTER — Other Ambulatory Visit: Payer: Self-pay

## 2019-01-20 ENCOUNTER — Other Ambulatory Visit: Payer: Self-pay | Admitting: Cardiology

## 2019-01-29 ENCOUNTER — Encounter: Payer: Self-pay | Admitting: Family Medicine

## 2019-01-29 ENCOUNTER — Ambulatory Visit (INDEPENDENT_AMBULATORY_CARE_PROVIDER_SITE_OTHER): Payer: PPO | Admitting: Family Medicine

## 2019-01-29 ENCOUNTER — Other Ambulatory Visit: Payer: Self-pay

## 2019-01-29 VITALS — BP 131/81 | Ht 66.0 in | Wt 220.0 lb

## 2019-01-29 DIAGNOSIS — R7303 Prediabetes: Secondary | ICD-10-CM

## 2019-01-29 DIAGNOSIS — I10 Essential (primary) hypertension: Secondary | ICD-10-CM | POA: Diagnosis not present

## 2019-01-29 DIAGNOSIS — H1013 Acute atopic conjunctivitis, bilateral: Secondary | ICD-10-CM

## 2019-01-29 DIAGNOSIS — E785 Hyperlipidemia, unspecified: Secondary | ICD-10-CM

## 2019-01-29 DIAGNOSIS — K219 Gastro-esophageal reflux disease without esophagitis: Secondary | ICD-10-CM | POA: Diagnosis not present

## 2019-01-29 DIAGNOSIS — J309 Allergic rhinitis, unspecified: Secondary | ICD-10-CM | POA: Diagnosis not present

## 2019-01-29 MED ORDER — BENZONATATE 100 MG PO CAPS: 100.0000 mg | ORAL_CAPSULE | Freq: Two times a day (BID) | ORAL | 0 refills | Status: DC | PRN

## 2019-01-29 NOTE — Progress Notes (Signed)
Virtual Visit via Telephone Note  I connected with Sarah Coleman on 01/29/19 at  8:00 AM EDT by telephone and verified that I am speaking with the correct person using two identifiers.  Location: Patient: home  Provider: office   I discussed the limitations, risks, security and privacy concerns of performing an evaluation and management service by telephone and the availability of in person appointments. I also discussed with the patient that there may be a patient responsible charge related to this service. The patient expressed understanding and agreed to proceed. This visit type is conducted due to national recommendations for restrictions regarding the COVID -19 Pandemic. Due to the patient's age and / or co morbidities, this format is felt to be most appropriate at this time without adequate follow up. The patient has no access to video technology/ had technical difficulties with video, requiring transitioning to audio format  only ( telephone ). All issues noted this document were discussed and addressed,no physical exam can be performed in this format.    History of Present Illness: 3 day h/o spots in both eyes when looking backwards, has happened in the past, has left sided headache. No increased nasal drainage , no discoloration or abnormal taste. No fever, intermittent chills x 3 days and dry cough, feels as though she has infection starting . Denies chest pains, palpitations and leg swelling Denies abdominal pain, nausea, vomiting,diarrhea or constipation.   Denies dysuria, frequency, hesitancy or incontinence. Denies joint pain, swelling and limitation in mobility. Denies headaches, seizures, numbness, or tingling. Denies depression, anxiety or insomnia. Denies skin break down or rash.       Observations/Objective: BP 131/81   Ht 5\' 6"  (1.676 m)   Wt 220 lb (99.8 kg)   BMI 35.51 kg/m  Good communication with no confusion and intact memory. Alert and oriented x  3 No signs of respiratory distress during speech    Assessment and Plan: Allergic rhinitis Increased and uncontrolled symptoms, tessalon perle  Prescribed short term and advised to take allergy med daily  Essential hypertension Controlled, no change in medication DASH diet and commitment to daily physical activity for a minimum of 30 minutes discussed and encouraged, as a part of hypertension management. The importance of attaining a healthy weight is also discussed.  BP/Weight 01/29/2019 09/26/2018 08/23/2018 05/31/2018 05/31/2018 7/62/8315 08/21/6158  Systolic BP 737 106 269 485 462 703 500  Diastolic BP 81 78 82 80 81 80 76  Wt. (Lbs) 220 229 227 224.12 221.8 221 221.04  BMI 35.51 36.96 36.64 36.17 35.8 35.67 35.68       Hyperlipidemia LDL goal <100 Hyperlipidemia:Low fat diet discussed and encouraged.   Lipid Panel  Lab Results  Component Value Date   CHOL 156 01/30/2019   HDL 37 (L) 01/30/2019   LDLCALC 83 01/30/2019   LDLDIRECT 69 02/13/2009   TRIG 271 (H) 01/30/2019   CHOLHDL 4.2 01/30/2019     Needs to reduce fried and fatty foods and increase exercise  Prediabetes Patient educated about the importance of limiting  Carbohydrate intake , the need to commit to daily physical activity for a minimum of 30 minutes , and to commit weight loss. The fact that changes in all these areas will reduce or eliminate all together the development of diabetes is stressed.  Improved  Diabetic Labs Latest Ref Rng & Units 01/30/2019 09/21/2018 05/31/2018 05/29/2018 01/16/2018  HbA1c <5.7 % of total Hgb 5.9(H) 6.4(H) 5.9(H) - -  Chol <200 mg/dL 156 159 172  180 -  HDL > OR = 50 mg/dL 37(L) 40(L) 33(L) 36(L) -  Calc LDL mg/dL (calc) 83 88 - 92 -  Triglycerides <150 mg/dL 271(H) 221(H) 486(H) 387(H) -  Creatinine 0.50 - 0.99 mg/dL 0.91 1.00(H) 1.03(H) 0.96 0.94   BP/Weight 01/29/2019 09/26/2018 08/23/2018 05/31/2018 05/31/2018 4/38/8875 02/20/7281  Systolic BP 060 156 153 794 133 327 614   Diastolic BP 81 78 82 80 81 80 76  Wt. (Lbs) 220 229 227 224.12 221.8 221 221.04  BMI 35.51 36.96 36.64 36.17 35.8 35.67 35.68   No flowsheet data found.    GERD (gastroesophageal reflux disease) Controlled, no change in medication   Morbid obesity ([place]) Obesity linked with hypertension, prediabetes and arthritis  Patient re-educated about  the importance of commitment to a  minimum of 150 minutes of exercise per week as able.  The importance of healthy food choices with portion control discussed, as well as eating regularly and within a 12 hour window most days. The need to choose "clean , green" food 50 to 75% of the time is discussed, as well as to make water the primary drink and set a goal of 64 ounces water daily.    Weight /BMI 01/29/2019 09/26/2018 08/23/2018  WEIGHT 220 lb 229 lb 227 lb  HEIGHT 5\' 6"  5\' 6"  5\' 6"   BMI 35.51 kg/m2 36.96 kg/m2 36.64 kg/m2      Allergic conjunctivitis Use of Visine oTC as needed advised    Follow Up Instructions:    I discussed the assessment and treatment plan with the patient. The patient was provided an opportunity to ask questions and all were answered. The patient agreed with the plan and demonstrated an understanding of the instructions.   The patient was advised to call back or seek an in-person evaluation if the symptoms worsen or if the condition fails to improve as anticipated.  I provided 25 minutes of non-face-to-face time during this encounter.   Tula Nakayama, MD

## 2019-01-29 NOTE — Patient Instructions (Signed)
Annual physical exam with MD 10/28 or after, call if you need me before  Pls schedule mammogram    Decongestants are prescribed  Labs for next visit will be mailed after you get those due  CONGRATS on weight loss, keep it up!  It is important that you exercise regularly at least 30 minutes 5 times a week. If you develop chest pain, have severe difficulty breathing, or feel very tired, stop exercising immediately and seek medical attention  Thanks for choosing [facility], we consider it a privelige to serve you.   Social distancing. Frequent hand washing with soap and water Keeping your hands off of your face. These 3 practices will help to keep both you and your community healthy during this time. Please practice them faithfully!

## 2019-01-30 ENCOUNTER — Ambulatory Visit: Payer: PPO | Admitting: Family Medicine

## 2019-01-30 DIAGNOSIS — R7303 Prediabetes: Secondary | ICD-10-CM | POA: Diagnosis not present

## 2019-01-30 DIAGNOSIS — E785 Hyperlipidemia, unspecified: Secondary | ICD-10-CM | POA: Diagnosis not present

## 2019-01-30 DIAGNOSIS — I10 Essential (primary) hypertension: Secondary | ICD-10-CM | POA: Diagnosis not present

## 2019-01-31 LAB — COMPLETE METABOLIC PANEL WITH GFR
AG RATIO: 1.8 (calc) (ref 1.0–2.5)
ALKALINE PHOSPHATASE (APISO): 51 U/L (ref 37–153)
ALT: 9 U/L (ref 6–29)
AST: 16 U/L (ref 10–35)
Albumin: 4.6 g/dL (ref 3.6–5.1)
BILIRUBIN TOTAL: 0.4 mg/dL (ref 0.2–1.2)
BUN: 12 mg/dL (ref 7–25)
CALCIUM: 10 mg/dL (ref 8.6–10.4)
CHLORIDE: 101 mmol/L (ref 98–110)
CO2: 27 mmol/L (ref 20–32)
CREATININE: 0.91 mg/dL (ref 0.50–0.99)
GFR, EST AFRICAN AMERICAN: 75 mL/min/{1.73_m2} (ref >=60)
GFR, Est Non African American: 64 mL/min/{1.73_m2} (ref >=60)
Globulin: 2.6 g/dL (calc) (ref 1.9–3.7)
Glucose, Bld: 105 mg/dL — ABNORMAL HIGH (ref 65–99)
Potassium: 4 mmol/L (ref 3.5–5.3)
SODIUM: 137 mmol/L (ref 135–146)
TOTAL PROTEIN: 7.2 g/dL (ref 6.1–8.1)

## 2019-01-31 LAB — LIPID PANEL
CHOL/HDL RATIO: 4.2 (calc) (ref <5.0)
Cholesterol: 156 mg/dL (ref <200)
HDL: 37 mg/dL — AB (ref >=50)
LDL Cholesterol (Calc): 83 mg/dL (calc)
Non-HDL Cholesterol (Calc): 119 mg/dL (calc) (ref <130)
TRIGLYCERIDES: 271 mg/dL — AB (ref <150)

## 2019-01-31 LAB — HEMOGLOBIN A1C
HEMOGLOBIN A1C: 5.9 %{Hb} — AB (ref <5.7)
MEAN PLASMA GLUCOSE: 123 (calc)
eAG (mmol/L): 6.8 (calc)

## 2019-02-03 ENCOUNTER — Encounter: Payer: Self-pay | Admitting: Family Medicine

## 2019-02-03 DIAGNOSIS — H1010 Acute atopic conjunctivitis, unspecified eye: Secondary | ICD-10-CM | POA: Insufficient documentation

## 2019-02-03 NOTE — Assessment & Plan Note (Signed)
Patient educated about the importance of limiting  Carbohydrate intake , the need to commit to daily physical activity for a minimum of 30 minutes , and to commit weight loss. The fact that changes in all these areas will reduce or eliminate all together the development of diabetes is stressed.  Improved  Diabetic Labs Latest Ref Rng & Units 01/30/2019 09/21/2018 05/31/2018 05/29/2018 01/16/2018  HbA1c <5.7 % of total Hgb 5.9(H) 6.4(H) 5.9(H) - -  Chol <200 mg/dL 156 159 172 180 -  HDL > OR = 50 mg/dL 37(L) 40(L) 33(L) 36(L) -  Calc LDL mg/dL (calc) 83 88 - 92 -  Triglycerides <150 mg/dL 271(H) 221(H) 486(H) 387(H) -  Creatinine 0.50 - 0.99 mg/dL 0.91 1.00(H) 1.03(H) 0.96 0.94   BP/Weight 01/29/2019 09/26/2018 08/23/2018 05/31/2018 05/31/2018 03/02/5973 02/12/8549  Systolic BP 158 682 574 935 521 747 159  Diastolic BP 81 78 82 80 81 80 76  Wt. (Lbs) 220 229 227 224.12 221.8 221 221.04  BMI 35.51 36.96 36.64 36.17 35.8 35.67 35.68   No flowsheet data found.

## 2019-02-03 NOTE — Assessment & Plan Note (Signed)
Increased and uncontrolled symptoms, tessalon perle  Prescribed short term and advised to take allergy med daily

## 2019-02-03 NOTE — Assessment & Plan Note (Signed)
Hyperlipidemia:Low fat diet discussed and encouraged.   Lipid Panel  Lab Results  Component Value Date   CHOL 156 01/30/2019   HDL 37 (L) 01/30/2019   LDLCALC 83 01/30/2019   LDLDIRECT 69 02/13/2009   TRIG 271 (H) 01/30/2019   CHOLHDL 4.2 01/30/2019     Needs to reduce fried and fatty foods and increase exercise

## 2019-02-03 NOTE — Assessment & Plan Note (Signed)
Use of Visine oTC as needed advised

## 2019-02-03 NOTE — Assessment & Plan Note (Signed)
Obesity linked with hypertension, prediabetes and arthritis  Patient re-educated about  the importance of commitment to a  minimum of 150 minutes of exercise per week as able.  The importance of healthy food choices with portion control discussed, as well as eating regularly and within a 12 hour window most days. The need to choose "clean , green" food 50 to 75% of the time is discussed, as well as to make water the primary drink and set a goal of 64 ounces water daily.    Weight /BMI 01/29/2019 09/26/2018 08/23/2018  WEIGHT 220 lb 229 lb 227 lb  HEIGHT 5\' 6"  5\' 6"  5\' 6"   BMI 35.51 kg/m2 36.96 kg/m2 36.64 kg/m2

## 2019-02-03 NOTE — Assessment & Plan Note (Signed)
Controlled, no change in medication  

## 2019-02-03 NOTE — Assessment & Plan Note (Signed)
Controlled, no change in medication DASH diet and commitment to daily physical activity for a minimum of 30 minutes discussed and encouraged, as a part of hypertension management. The importance of attaining a healthy weight is also discussed.  BP/Weight 01/29/2019 09/26/2018 08/23/2018 05/31/2018 05/31/2018 02/13/7792 9/0/3009  Systolic BP 233 007 622 633 354 562 563  Diastolic BP 81 78 82 80 81 80 76  Wt. (Lbs) 220 229 227 224.12 221.8 221 221.04  BMI 35.51 36.96 36.64 36.17 35.8 35.67 35.68

## 2019-02-04 ENCOUNTER — Telehealth: Payer: Self-pay | Admitting: *Deleted

## 2019-02-04 NOTE — Telephone Encounter (Signed)
Pt said she was saw by Dr. Moshe Cipro on June 16. Dr. Moshe Cipro had told her to notify her if she needed antibiotics for allergy symptoms and at the time she did not but she does now and would like Dr. Moshe Cipro to call her in some to [facility] in [place]

## 2019-02-05 ENCOUNTER — Other Ambulatory Visit: Payer: Self-pay | Admitting: Family Medicine

## 2019-02-05 MED ORDER — AZITHROMYCIN 250 MG PO TABS: ORAL_TABLET | ORAL | 0 refills | Status: DC

## 2019-02-05 MED ORDER — FLUCONAZOLE 150 MG PO TABS: ORAL_TABLET | ORAL | 0 refills | Status: DC

## 2019-02-05 NOTE — Telephone Encounter (Signed)
Called pt no answer LVM

## 2019-02-05 NOTE — Telephone Encounter (Signed)
Azithrpomycin and one fluconazole tablet ( if needed) are prescribed , pls let her know, tried calling no answer

## 2019-02-05 NOTE — Telephone Encounter (Signed)
Tried calling pt no answer will try again later

## 2019-02-06 NOTE — Telephone Encounter (Signed)
Called patient. No answer. Lvm. 3rd attempt

## 2019-02-07 ENCOUNTER — Other Ambulatory Visit: Payer: Self-pay

## 2019-02-07 ENCOUNTER — Encounter (HOSPITAL_COMMUNITY): Payer: Self-pay

## 2019-02-07 ENCOUNTER — Ambulatory Visit (HOSPITAL_COMMUNITY)
Admission: RE | Admit: 2019-02-07 | Discharge: 2019-02-07 | Disposition: A | Discharge status: Home / Self Care | Payer: PPO | Source: Ambulatory Visit | Attending: Family Medicine | Admitting: Family Medicine

## 2019-02-07 DIAGNOSIS — Z1231 Encounter for screening mammogram for malignant neoplasm of breast: Secondary | ICD-10-CM | POA: Insufficient documentation

## 2019-02-26 ENCOUNTER — Telehealth: Payer: Self-pay | Admitting: Cardiology

## 2019-02-26 NOTE — Telephone Encounter (Signed)
Virtual Visit Pre-Appointment Phone Call  "(Name), I am calling you today to discuss your upcoming appointment. We are currently trying to limit exposure to the virus that causes COVID-19 by seeing patients at home rather than in the office."  1. "What is the BEST phone number to call the day of the visit?" - include this in appointment notes  2. Do you have or have access to (through a family member/friend) a smartphone with video capability that we can use for your visit?" a. If yes - list this number in appt notes as cell (if different from BEST phone #) and list the appointment type as a VIDEO visit in appointment notes b. If no - list the appointment type as a PHONE visit in appointment notes  3. Confirm consent - "In the setting of the current Covid19 crisis, you are scheduled for a (phone or video) visit with your provider on (date) at (time).  Just as we do with many in-office visits, in order for you to participate in this visit, we must obtain consent.  If you'd like, I can send this to your mychart (if signed up) or email for you to review.  Otherwise, I can obtain your verbal consent now.  All virtual visits are billed to your insurance company just like a normal visit would be.  By agreeing to a virtual visit, we'd like you to understand that the technology does not allow for your provider to perform an examination, and thus may limit your provider's ability to fully assess your condition. If your provider identifies any concerns that need to be evaluated in person, we will make arrangements to do so.  Finally, though the technology is pretty good, we cannot assure that it will always work on either your or our end, and in the setting of a video visit, we may have to convert it to a phone-only visit.  In either situation, we cannot ensure that we have a secure connection.  Are you willing to proceed?" STAFF: Did the patient verbally acknowledge consent to telehealth visit? Document  YES/NO here: yes  4. Advise patient to be prepared - "Two hours prior to your appointment, go ahead and check your blood pressure, pulse, oxygen saturation, and your weight (if you have the equipment to check those) and write them all down. When your visit starts, your provider will ask you for this information. If you have an Apple Watch or Kardia device, please plan to have heart rate information ready on the day of your appointment. Please have a pen and paper handy nearby the day of the visit as well."  5. Give patient instructions for MyChart download to smartphone OR Doximity/Doxy.me as below if video visit (depending on what platform provider is using)  6. Inform patient they will receive a phone call 15 minutes prior to their appointment time (may be from unknown caller ID) so they should be prepared to answer    TELEPHONE CALL NOTE  Sarah Coleman has been deemed a candidate for a follow-up tele-health visit to limit community exposure during the Covid-19 pandemic. I spoke with the patient via phone to ensure availability of phone/video source, confirm preferred email & phone number, and discuss instructions and expectations.  I reminded Sarah Coleman to be prepared with any vital sign and/or heart rhythm information that could potentially be obtained via home monitoring, at the time of her visit. I reminded Sarah Coleman to expect a phone call prior to  her visit.  Weston Anna 02/26/2019 9:15 AM   INSTRUCTIONS FOR DOWNLOADING THE MYCHART APP TO SMARTPHONE  - The patient must first make sure to have activated MyChart and know their login information - If Apple, go to [employer] and type in MyChart in the search bar and download the app. If Android, ask patient to go to [employer] and type in [place] in the search bar and download the app. The app is free but as with any other app downloads, their phone may require them to verify saved payment information or  Apple/Android password.  - The patient will need to then log into the app with their MyChart username and password, and select [facility] as their healthcare provider to link the account. When it is time for your visit, go to the MyChart app, find appointments, and click Begin Video Visit. Be sure to Select Allow for your device to access the Microphone and Camera for your visit. You will then be connected, and your provider will be with you shortly.  **If they have any issues connecting, or need assistance please contact MyChart service desk [phone])**  **If using a computer, in order to ensure the best quality for their visit they will need to use either of the following Internet Browsers: [employer], or Google Chrome**  IF USING DOXIMITY or DOXY.ME - The patient will receive a link just prior to their visit by text.     FULL LENGTH CONSENT FOR TELE-HEALTH VISIT   I hereby voluntarily request, consent and authorize [facility] and its employed or contracted physicians, physician assistants, nurse practitioners or other licensed health care professionals (the Practitioner), to provide me with telemedicine health care services (the Services") as deemed necessary by the treating Practitioner. I acknowledge and consent to receive the Services by the Practitioner via telemedicine. I understand that the telemedicine visit will involve communicating with the Practitioner through live audiovisual communication technology and the disclosure of certain medical information by electronic transmission. I acknowledge that I have been given the opportunity to request an in-person assessment or other available alternative prior to the telemedicine visit and am voluntarily participating in the telemedicine visit.  I understand that I have the right to withhold or withdraw my consent to the use of telemedicine in the course of my care at any time, without affecting my right to future care  or treatment, and that the Practitioner or I may terminate the telemedicine visit at any time. I understand that I have the right to inspect all information obtained and/or recorded in the course of the telemedicine visit and may receive copies of available information for a reasonable fee.  I understand that some of the potential risks of receiving the Services via telemedicine include:   Delay or interruption in medical evaluation due to technological equipment failure or disruption;  Information transmitted may not be sufficient (e.g. poor resolution of images) to allow for appropriate medical decision making by the Practitioner; and/or   In rare instances, security protocols could fail, causing a breach of personal health information.  Furthermore, I acknowledge that it is my responsibility to provide information about my medical history, conditions and care that is complete and accurate to the best of my ability. I acknowledge that Practitioner's advice, recommendations, and/or decision may be based on factors not within their control, such as incomplete or inaccurate data provided by me or distortions of diagnostic images or specimens that may result from electronic transmissions. I  understand that the practice of medicine is not an exact science and that Practitioner makes no warranties or guarantees regarding treatment outcomes. I acknowledge that I will receive a copy of this consent concurrently upon execution via email to the email address I last provided but may also request a printed copy by calling the office of [facility].    I understand that my insurance will be billed for this visit.   I have read or had this consent read to me.  I understand the contents of this consent, which adequately explains the benefits and risks of the Services being provided via telemedicine.   I have been provided ample opportunity to ask questions regarding this consent and the Services and have had  my questions answered to my satisfaction.  I give my informed consent for the services to be provided through the use of telemedicine in my medical care  By participating in this telemedicine visit I agree to the above.

## 2019-03-01 ENCOUNTER — Telehealth (INDEPENDENT_AMBULATORY_CARE_PROVIDER_SITE_OTHER): Payer: PPO | Admitting: Cardiology

## 2019-03-01 ENCOUNTER — Encounter: Payer: Self-pay | Admitting: Cardiology

## 2019-03-01 VITALS — BP 131/81 | Ht 66.0 in | Wt 215.0 lb

## 2019-03-01 DIAGNOSIS — E782 Mixed hyperlipidemia: Secondary | ICD-10-CM

## 2019-03-01 DIAGNOSIS — I10 Essential (primary) hypertension: Secondary | ICD-10-CM | POA: Diagnosis not present

## 2019-03-01 DIAGNOSIS — R079 Chest pain, unspecified: Secondary | ICD-10-CM

## 2019-03-01 NOTE — Progress Notes (Signed)
Virtual Visit via Telephone Note   This visit type was conducted due to national recommendations for restrictions regarding the COVID-19 Pandemic (e.g. social distancing) in an effort to limit this patient's exposure and mitigate transmission in our community.  Due to her co-morbid illnesses, this patient is at least at moderate risk for complications without adequate follow up.  This format is felt to be most appropriate for this patient at this time.  The patient did not have access to video technology/had technical difficulties with video requiring transitioning to audio format only (telephone).  All issues noted in this document were discussed and addressed.  No physical exam could be performed with this format.  Please refer to the patient's chart for her  consent to telehealth for [facility].   Date:  03/01/2019   ID:  Sarah Coleman, DOB May 07, 1951, MRN [id number]  Patient Location: Home Provider Location: Office  PCP:  Fayrene Helper, MD  Cardiologist:  Dr Carlyle Dolly MD Electrophysiologist:  None   Evaluation Performed:  Follow-Up Visit  Chief Complaint:  Follow up visit  History of Present Illness:    Sarah Coleman is a 68 y.o. female seen today for follow up of the following medical problems.   1. HTN- compliant with meds    2. Hyperlipidemia 01/2019 TC 156 HDL 37 TG 271 LDL 83 compliant with statin and fish oil  3. Chest pain - admit 02/09/17 with chest pain - described as atypical, associated with food. No evidence of ACS by enzymes or EKG. DIscharged withoutpatient stress arranged - 02/20/17 stress small mild intensity reversible apical defect, likely artifact cannot excluded mild ischemia. Overall low risk 01/2017 echo LVEF 60-65%, no WMAs  - Has done very well since starting antacid.   - no recent symptoms.   The patient does not have symptoms concerning for COVID-19 infection (fever, chills, cough, or new shortness of breath).    Past  Medical History:  Diagnosis Date  . Allergic rhinitis   . Allergy   . Anemia   . Arthritis   . GERD (gastroesophageal reflux disease) 2013  . Hyperlipidemia   . Hypertension   . Metabolic syndrome X 0240  . Morbid obesity ([place]) 2000  . Obesity   . Prediabetes 2010  . Seizures ([place])    as child; unknown etiology.   Past Surgical History:  Procedure Laterality Date  . BIOPSY  04/11/2017   Procedure: BIOPSY;  Surgeon: Danie Binder, MD;  [facility];  Service: Endoscopy;;  gastric bx's  . COLONOSCOPY N/A 10/07/2016   Procedure: COLONOSCOPY;  Surgeon: Danie Binder, MD;  [facility];  Service: Endoscopy;  Laterality: N/A;  9:30 AM  . COLONOSCOPY WITH PROPOFOL N/A 11/01/2016   Procedure: COLONOSCOPY WITH PROPOFOL;  Surgeon: Danie Binder, MD;  [facility];  Service: Endoscopy;  Laterality: N/A;  830   . COLONOSCOPY WITH PROPOFOL N/A 01/23/2018   Procedure: COLONOSCOPY WITH PROPOFOL;  Surgeon: Danie Binder, MD;  [facility];  Service: Endoscopy;  Laterality: N/A;  2:00pm - pt knows to arrive at 10:15  . ESOPHAGOGASTRODUODENOSCOPY (EGD) WITH PROPOFOL N/A 04/11/2017   Procedure: ESOPHAGOGASTRODUODENOSCOPY (EGD) WITH PROPOFOL;  Surgeon: Danie Binder, MD;  [facility];  Service: Endoscopy;  Laterality: N/A;  10:45am  . GERD    . PARTIAL HYSTERECTOMY  1987   secondary to ovarian cyst   . POLYPECTOMY  11/01/2016   Procedure: POLYPECTOMY;  Surgeon:  Danie Binder, MD;  [facility];  Service: Endoscopy;;  colon  . POLYPECTOMY  01/23/2018   Procedure: POLYPECTOMY;  Surgeon: Danie Binder, MD;  [facility];  Service: Endoscopy;;  ascending colon  . SAVORY DILATION N/A 04/11/2017   Procedure: SAVORY DILATION;  Surgeon: Danie Binder, MD;  [facility];  Service: Endoscopy;  Laterality: N/A;  . TUBAL LIGATION  1975     No outpatient medications have been marked as taking for the 03/01/19  encounter (Appointment) with [name], MD.     Allergies:   Cinnamon, Ibuprofen, Penicillins, and Sweet potato   Social History   Tobacco Use  . Smoking status: Former Smoker    Packs/day: 1.00    Years: 3.00    Pack years: 3.00    Quit date: 08/15/1974    Years since quitting: 44.5  . Smokeless tobacco: Never Used  Substance Use Topics  . Alcohol use: No    Alcohol/week: 0.0 standard drinks  . Drug use: No     Family Hx: The patient's family history includes Cancer (age of onset: 62) in her brother; Colon cancer (age of onset: 34) in her father; Heart attack in her mother; Heart disease in her brother; Heart disease (age of onset: 17) in her mother; Hypertension in her brother and sister; Ovarian cancer in her sister; Prostate cancer in her father.  ROS:   Please see the history of present illness.     All other systems reviewed and are negative.   Prior CV studies:   The following studies were reviewed today:  01/2015 echo Study Conclusions  - Left ventricle: The cavity size was normal. Wall thickness was  normal. Systolic function was normal. The estimated ejection  fraction was in the range of 60% to 65%. Wall motion was normal;  there were no regional wall motion abnormalities. Doppler  parameters are consistent with abnormal left ventricular  relaxation (grade 1 diastolic dysfunction). - Mitral valve: There was trivial regurgitation. - Right atrium: Central venous pressure (est): 3 mm Hg. - Atrial septum: A patent foramen ovale cannot be excluded based on  available images. - Tricuspid valve: There was trivial regurgitation. - Pericardium, extracardiac: There was no pericardial effusion.  Impressions:  - Normal LV wall thickness with LVEF 97-41%, grade 1 diastolic  dysfunction. Trivial mitral and tricuspid regurgitation. Cannot  exclude PFO based on available images. Unable to assess PASP.  01/2017 echo Study Conclusions  - Left  ventricle: The cavity size was normal. Systolic function was normal. The estimated ejection fraction was in the range of 60% to 65%. Wall motion was normal; there were no regional wall motion abnormalities. Left ventricular diastolic function parameters were normal. - Atrial septum: No obvious PFO bubble study not done. Mid septum mobile   02/2017 Nuclear stress  No diagnostic ST segment changes to indicate ischemia. Low risk Duke treadmill score of 4.5.  Blood pressure demonstrated a hypertensive response to exercise.  Small, mild intensity, reversible apical defect, possibly related to variable breast attenuation artifact, less likely a small ischemic territory.  This is a low risk study.  Nuclear stress EF: 63%.  Labs/Other Tests and Data Reviewed:    EKG:  No ECG reviewed.  Recent Labs: 05/31/2018: TSH 1.34 09/21/2018: Hemoglobin 12.5; Platelets 282 01/30/2019: ALT 9; BUN 12; Creat 0.91; Potassium 4.0; Sodium 137   Recent Lipid Panel Lab Results  Component Value Date/Time   CHOL 156 01/30/2019 08:23 AM  TRIG 271 (H) 01/30/2019 08:23 AM   HDL 37 (L) 01/30/2019 08:23 AM   CHOLHDL 4.2 01/30/2019 08:23 AM   LDLCALC 83 01/30/2019 08:23 AM   LDLDIRECT 69 02/13/2009 03:54 PM    Wt Readings from Last 3 Encounters:  01/29/19 220 lb (99.8 kg)  09/26/18 229 lb (103.9 kg)  08/23/18 227 lb (103 kg)     Objective:    Vital Signs:   Today's Vitals   03/01/19 1024  BP: 131/81  Weight: 215 lb (97.5 kg)  Height: 5\' 6"  (1.676 m)   Body mass index is 34.7 kg/m.  Normal affect. Normal speech pattern and tone. No audible signs of SOB or wheezing. Comfortable, no apparent distress.   ASSESSMENT & PLAN:    1. HTN - at goal, continue current meds   2. Hyperlipidemia - LDL at goal Discussed dietary changes to improve TGs  3. Chest pain - noncardiac, prior stress test overall low risk. Probable GI cause,, no recurrent symptoms since starting antacid -  doing well without recent symptoms, continue to monitor.    F/u 1 year  COVID-19 Education: The signs and symptoms of COVID-19 were discussed with the patient and how to seek care for testing (follow up with PCP or arrange E-visit).  The importance of social distancing was discussed today.  Time:   Today, I have spent 24 minutes with the patient with telehealth technology discussing the above problems.     Medication Adjustments/Labs and Tests Ordered: Current medicines are reviewed at length with the patient today.  Concerns regarding medicines are outlined above.   Tests Ordered: No orders of the defined types were placed in this encounter.   Medication Changes: No orders of the defined types were placed in this encounter.   Follow Up:  In Person in 1 year(s)  Signed, Carlyle Dolly, MD  03/01/2019 10:07 AM    [place]

## 2019-03-01 NOTE — Patient Instructions (Signed)

## 2019-03-22 ENCOUNTER — Other Ambulatory Visit: Payer: Self-pay | Admitting: Cardiology

## 2019-03-22 ENCOUNTER — Other Ambulatory Visit: Payer: Self-pay | Admitting: Family Medicine

## 2019-04-15 ENCOUNTER — Ambulatory Visit (INDEPENDENT_AMBULATORY_CARE_PROVIDER_SITE_OTHER): Payer: PPO

## 2019-04-15 ENCOUNTER — Other Ambulatory Visit: Payer: Self-pay

## 2019-04-15 DIAGNOSIS — Z23 Encounter for immunization: Secondary | ICD-10-CM

## 2019-04-24 ENCOUNTER — Other Ambulatory Visit: Payer: Self-pay | Admitting: Family Medicine

## 2019-05-14 ENCOUNTER — Encounter: Payer: Self-pay | Admitting: Gastroenterology

## 2019-06-13 ENCOUNTER — Encounter: Payer: PPO | Admitting: Family Medicine

## 2019-06-15 ENCOUNTER — Other Ambulatory Visit: Payer: Self-pay | Admitting: Family Medicine

## 2019-07-05 ENCOUNTER — Other Ambulatory Visit: Payer: Self-pay | Admitting: Family Medicine

## 2019-07-16 ENCOUNTER — Ambulatory Visit (INDEPENDENT_AMBULATORY_CARE_PROVIDER_SITE_OTHER): Payer: PPO | Admitting: Family Medicine

## 2019-07-16 ENCOUNTER — Other Ambulatory Visit: Payer: Self-pay

## 2019-07-16 ENCOUNTER — Encounter: Payer: Self-pay | Admitting: Family Medicine

## 2019-07-16 VITALS — BP 125/87 | Ht 66.0 in | Wt 210.0 lb

## 2019-07-16 DIAGNOSIS — J209 Acute bronchitis, unspecified: Secondary | ICD-10-CM | POA: Diagnosis not present

## 2019-07-16 DIAGNOSIS — I10 Essential (primary) hypertension: Secondary | ICD-10-CM | POA: Diagnosis not present

## 2019-07-16 DIAGNOSIS — J0100 Acute maxillary sinusitis, unspecified: Secondary | ICD-10-CM | POA: Diagnosis not present

## 2019-07-16 DIAGNOSIS — J309 Allergic rhinitis, unspecified: Secondary | ICD-10-CM

## 2019-07-16 MED ORDER — PREDNISONE 10 MG PO TABS: 10.0000 mg | ORAL_TABLET | Freq: Two times a day (BID) | ORAL | 0 refills | Status: DC

## 2019-07-16 MED ORDER — CHLORPHENIRAMINE MALEATE 4 MG PO TABS: ORAL_TABLET | ORAL | 1 refills | Status: DC

## 2019-07-16 MED ORDER — AZITHROMYCIN 250 MG PO TABS: ORAL_TABLET | ORAL | 0 refills | Status: DC

## 2019-07-16 MED ORDER — FLUCONAZOLE 150 MG PO TABS: ORAL_TABLET | ORAL | 0 refills | Status: DC

## 2019-07-16 NOTE — Assessment & Plan Note (Signed)
Azithromycin and fluconazole prescribed

## 2019-07-16 NOTE — Progress Notes (Signed)
Virtual Visit via Telephone Note  I connected with Sarah Coleman on 07/16/19 at  9:20 AM EST by telephone and verified that I am speaking with the correct person using two identifiers.  Location: Patient: home Provider: office   I discussed the limitations, risks, security and privacy concerns of performing an evaluation and management service by telephone and the availability of in person appointments. I also discussed with the patient that there may be a patient responsible charge related to this service. The patient expressed understanding and agreed to proceed.   History of Present Illness: 1 week h/o thick dark nasal drainage, also sore throoat Left ear pain, no facial pressure. Cough productive of thick white sputum No Fever and chills yesterday, no known covid exposure, prior to this was doing well.    Observations/Objective: BP 125/87   Ht 5\' 6"  (1.676 m)   Wt 210 lb (95.3 kg)   BMI 33.89 kg/m  Good communication with no confusion and intact memory. Alert and oriented x 3 No signs of respiratory distress during speech    Assessment and Plan: Acute sinusitis Azithromycin and fluconazole prescribed  Allergic rhinitis uncontrolled currently, short course of oral prednisone prescribed, also chlorpheniramine  Acute bronchitis Azithromycin prescribed and advised robitussin dM  also  Essential hypertension Controlled, no change in medication DASH diet and commitment to daily physical activity for a minimum of 30 minutes discussed and encouraged, as a part of hypertension management. The importance of attaining a healthy weight is also discussed.  BP/Weight 07/16/2019 03/01/2019 01/29/2019 09/26/2018 08/23/2018 05/31/2018 [id number]  Systolic BP 0000000 [id number] A999333 [id number] 0000000 [id number]  Diastolic BP 87 81 81 78 82 80 81  Wt. (Lbs) 210 215 220 229 227 224.12 221.8  BMI 33.89 34.7 35.51 36.96 36.64 36.17 35.8       Morbid obesity (HCC)  Patient re-educated about  the importance  of commitment to a  minimum of 150 minutes of exercise per week as able.  The importance of healthy food choices with portion control discussed, as well as eating regularly and within a 12 hour window most days. The need to choose "clean , green" food 50 to 75% of the time is discussed, as well as to make water the primary drink and set a goal of 64 ounces water daily.    Weight /BMI 07/16/2019 03/01/2019 01/29/2019  WEIGHT 210 lb 215 lb 220 lb  HEIGHT 5\' 6"  5\' 6"  5\' 6"   BMI 33.89 kg/m2 34.7 kg/m2 35.51 kg/m2       Follow Up Instructions:    I discussed the assessment and treatment plan with the patient. The patient was provided an opportunity to ask questions and all were answered. The patient agreed with the plan and demonstrated an understanding of the instructions.   The patient was advised to call back or seek an in-person evaluation if the symptoms worsen or if the condition fails to improve as anticipated.  I provided 25 minutes of non-face-to-face time during this encounter.   Tula Nakayama, MD

## 2019-07-16 NOTE — Patient Instructions (Signed)
Follow-up for annual physical exam as before call if you need me sooner.  You are treated today for uncontrolled allergies with acute sinusitis and bronchitis.  Fluconazole azithromycin chlorpheniramine and short course of prednisone are prescribed please take medication as directed.  Thanks for choosing [facility], we consider it a privelige to serve you.

## 2019-07-16 NOTE — Assessment & Plan Note (Signed)
  Patient re-educated about  the importance of commitment to a  minimum of 150 minutes of exercise per week as able.  The importance of healthy food choices with portion control discussed, as well as eating regularly and within a 12 hour window most days. The need to choose "clean , green" food 50 to 75% of the time is discussed, as well as to make water the primary drink and set a goal of 64 ounces water daily.    Weight /BMI 07/16/2019 03/01/2019 01/29/2019  WEIGHT 210 lb 215 lb 220 lb  HEIGHT 5\' 6"  5\' 6"  5\' 6"   BMI 33.89 kg/m2 34.7 kg/m2 35.51 kg/m2

## 2019-07-16 NOTE — Assessment & Plan Note (Signed)
Controlled, no change in medication DASH diet and commitment to daily physical activity for a minimum of 30 minutes discussed and encouraged, as a part of hypertension management. The importance of attaining a healthy weight is also discussed.  BP/Weight 07/16/2019 03/01/2019 01/29/2019 09/26/2018 08/23/2018 05/31/2018 [id number]  Systolic BP 0000000 A999333 A999333 [id number] 0000000 Q000111Q  Diastolic BP 87 81 81 78 82 80 81  Wt. (Lbs) 210 215 220 229 227 224.12 221.8  BMI 33.89 34.7 35.51 36.96 36.64 36.17 35.8

## 2019-07-16 NOTE — Assessment & Plan Note (Addendum)
uncontrolled currently, short course of oral prednisone prescribed, also chlorpheniramine

## 2019-07-16 NOTE — Assessment & Plan Note (Signed)
Azithromycin prescribed and advised robitussin dM  also

## 2019-07-23 ENCOUNTER — Other Ambulatory Visit: Payer: Self-pay | Admitting: Family Medicine

## 2019-07-25 ENCOUNTER — Encounter: Payer: PPO | Admitting: Family Medicine

## 2019-07-30 ENCOUNTER — Other Ambulatory Visit: Payer: Self-pay

## 2019-07-30 ENCOUNTER — Encounter: Payer: Self-pay | Admitting: Nurse Practitioner

## 2019-07-30 ENCOUNTER — Ambulatory Visit: Payer: PPO | Admitting: Nurse Practitioner

## 2019-07-30 VITALS — BP 150/84 | HR 87 | Temp 96.6°F | Ht 66.0 in | Wt 225.4 lb

## 2019-07-30 DIAGNOSIS — R1310 Dysphagia, unspecified: Secondary | ICD-10-CM

## 2019-07-30 DIAGNOSIS — K219 Gastro-esophageal reflux disease without esophagitis: Secondary | ICD-10-CM | POA: Diagnosis not present

## 2019-07-30 DIAGNOSIS — K649 Unspecified hemorrhoids: Secondary | ICD-10-CM | POA: Diagnosis not present

## 2019-07-30 DIAGNOSIS — R1319 Other dysphagia: Secondary | ICD-10-CM

## 2019-07-30 NOTE — Progress Notes (Signed)
CC'ED TO PCP 

## 2019-07-30 NOTE — Progress Notes (Signed)
Referring Provider: Fayrene Helper, MD Primary Care Physician:  Fayrene Helper, MD Primary GI:  Dr. Oneida Alar  Chief Complaint  Patient presents with  . Gastroesophageal Reflux    occ, takes otc    HPI:   Sarah Coleman is a 68 y.o. female who presents for follow-up.  The patient was last seen in our office 05/31/2018 for GERD, dysphagia, hemorrhoids.  Noted history of chronic GERD.  Cardiovascular work-up with low overall cardiovascular risk.  EGD 2018 status post dilation for esophageal web.  Colonoscopy up-to-date 2018 and recommended 3-year repeat (next due in 2019 through 2021) due to three 4 to 5 mm tubular adenoma polyps.  At her last visit she was doing well on Zantac as needed with breakthrough only due to dietary indiscretions.  No further dysphagia.  Occasional/rare hemorrhoid symptoms that are relieved with over-the-counter Preparation H.  No other overt GI symptoms.  Recommended Anusol rectal cream as needed which was sent to her pharmacy, follow-up in 1 year.  Today she states she's doing ok overall. She has occasional/rare GERD symptoms which she is able to manage with OTC medications (sounds like omeprazole? And TUMS). This works adequately. Typically only has symptoms with greasy foods. No recurrent dysphagia over the past year. Denies abdominal pain, N/V, hematochezia, melena, fever, chills, unintentional weight loss. Hemorrhoids doing ok with topical therapy. Denies URI or flu-like symptoms. Denies loss of sense of taste or smell. Denies chest pain, dyspnea, dizziness, lightheadedness, syncope, near syncope. Denies any other upper or lower GI symptoms.  Past Medical History:  Diagnosis Date  . Allergic rhinitis   . Allergy   . Anemia   . Arthritis   . GERD (gastroesophageal reflux disease) 2013  . Hyperlipidemia   . Hypertension   . Metabolic syndrome X [id number]  . Morbid obesity ([name]) 2000  . Obesity   . Prediabetes 2010  . Seizures ([place])    as child;  unknown etiology.    Past Surgical History:  Procedure Laterality Date  . BIOPSY  04/11/2017   Procedure: BIOPSY;  Surgeon: Danie Binder, MD;  [facility];  Service: Endoscopy;;  gastric bx's  . COLONOSCOPY N/A 10/07/2016   Procedure: COLONOSCOPY;  Surgeon: Danie Binder, MD;  [facility];  Service: Endoscopy;  Laterality: N/A;  9:30 AM  . COLONOSCOPY WITH PROPOFOL N/A 11/01/2016   Procedure: COLONOSCOPY WITH PROPOFOL;  Surgeon: Danie Binder, MD;  [facility];  Service: Endoscopy;  Laterality: N/A;  830   . COLONOSCOPY WITH PROPOFOL N/A 01/23/2018   Procedure: COLONOSCOPY WITH PROPOFOL;  Surgeon: Danie Binder, MD;  [facility];  Service: Endoscopy;  Laterality: N/A;  2:00pm - pt knows to arrive at 10:15  . ESOPHAGOGASTRODUODENOSCOPY (EGD) WITH PROPOFOL N/A 04/11/2017   Procedure: ESOPHAGOGASTRODUODENOSCOPY (EGD) WITH PROPOFOL;  Surgeon: Danie Binder, MD;  [facility];  Service: Endoscopy;  Laterality: N/A;  10:45am  . GERD    . PARTIAL HYSTERECTOMY  1987   secondary to ovarian cyst   . POLYPECTOMY  11/01/2016   Procedure: POLYPECTOMY;  Surgeon: Danie Binder, MD;  [facility];  Service: Endoscopy;;  colon  . POLYPECTOMY  01/23/2018   Procedure: POLYPECTOMY;  Surgeon: Danie Binder, MD;  [facility];  Service: Endoscopy;;  ascending colon  . SAVORY DILATION N/A 04/11/2017   Procedure: SAVORY DILATION;  Surgeon: Danie Binder, MD;  [facility];  Service: Endoscopy;  Laterality: N/A;  . TUBAL LIGATION  1975    Current Outpatient Medications  Medication Sig Dispense Refill  . acetaminophen (TYLENOL) 500 MG tablet Take 1,000 mg by mouth 2 (two) times daily as needed for moderate pain or headache.    Marland Kitchen aspirin EC 81 MG tablet Take 81 mg by mouth at bedtime.     . Azelastine HCl 137 MCG/SPRAY SOLN USE 1 SPRAY(S) IN EACH NOSTRIL TWICE DAILY AS NEEDED 30 mL 0  . benzonatate (TESSALON) 100  MG capsule Take 1 capsule (100 mg total) by mouth 2 (two) times daily as needed for cough. 20 capsule 0  . Calcium Carbonate Antacid (EQ ANTACID PO) Take by mouth as needed.    Marland Kitchen CALCIUM-MAGNESIUM-ZINC PO Take 1 tablet by mouth 2 (two) times daily.     . carboxymethylcellulose (REFRESH PLUS) 0.5 % SOLN Place 1-2 drops into both eyes 3 (three) times daily as needed (for dry eyes).    . chlorpheniramine (CHLOR-TRIMETON) 4 MG tablet Take one tablet once daily as needed, for excessive nasal drainage 14 tablet 1  . chlorthalidone (HYGROTON) 25 MG tablet TAKE 1 TABLET BY MOUTH ONCE DAILY (STOPPING  MAXZIDE) 90 tablet 3  . estradiol (ESTRACE) 0.1 MG/GM vaginal cream USE 1 APPLICATORFUL VAGINALLY EVERY OTHER DAY AS NEEDED FOR  VAGINAL  DISCOMFORT 43 g 0  . fluconazole (DIFLUCAN) 150 MG tablet Take one tablet once daily as needed for vaginal itch associated with antibiotic use 2 tablet 0  . fluticasone (FLONASE) 50 MCG/ACT nasal spray Place 2 sprays into both nostrils daily. 16 g 11  . loratadine (CLARITIN) 10 MG tablet Take two tablets once daily for allergies 180 tablet 3  . lovastatin (MEVACOR) 40 MG tablet Take 1 tablet (40 mg total) by mouth at bedtime. 90 tablet 3  . meclizine (ANTIVERT) 12.5 MG tablet Take 1 tablet (12.5 mg total) by mouth 3 (three) times daily as needed for dizziness. 30 tablet 1  . montelukast (SINGULAIR) 10 MG tablet TAKE 1 TABLET BY MOUTH AT BEDTIME 90 tablet 0  . Omega-3 300 MG CAPS Take 1,200 mg by mouth daily.    . potassium chloride SA (KLOR-CON) 20 MEQ tablet TAKE 2  BY MOUTH THREE TIMES DAILY 540 tablet 0  . spironolactone (ALDACTONE) 25 MG tablet Take 1/2 (one-half) tablet by mouth once daily 45 tablet 2   No current facility-administered medications for this visit.    Allergies as of 07/30/2019 - Review Complete 07/30/2019  Allergen Reaction Noted  . Cinnamon Swelling 10/24/2016  . Ibuprofen Swelling 12/06/2007  . Penicillins Nausea And Vomiting 12/06/2007  . Sweet  potato Swelling 10/24/2016    Family History  Problem Relation Age of Onset  . Heart attack Mother   . Heart disease Mother 91       massive heart attack  . Prostate cancer Father   . Colon cancer Father 18  . Hypertension Sister   . Ovarian cancer Sister   . Cancer Brother 43       oral   . Hypertension Brother   . Heart disease Brother     Social History   Socioeconomic History  . Marital status: Married    Spouse name: Not on file  . Number of children: 5  . Years of education: Not on file  . Highest education level: Not on file  Occupational History  . Occupation: retired   Tobacco Use  . Smoking status: Former Smoker    Packs/day: 1.00  Years: 3.00    Pack years: 3.00    Quit date: 08/15/1974    Years since quitting: 44.9  . Smokeless tobacco: Never Used  Substance and Sexual Activity  . Alcohol use: No    Alcohol/week: 0.0 standard drinks  . Drug use: No  . Sexual activity: Yes    Birth control/protection: Surgical  Other Topics Concern  . Not on file  Social History Narrative  . Not on file   Social Determinants of Health   Financial Resource Strain:   . Difficulty of Paying Living Expenses: Not on file  Food Insecurity:   . Worried About Charity fundraiser in the Last Year: Not on file  . Ran Out of Food in the Last Year: Not on file  Transportation Needs:   . Lack of Transportation (Medical): Not on file  . Lack of Transportation (Non-Medical): Not on file  Physical Activity:   . Days of Exercise per Week: Not on file  . Minutes of Exercise per Session: Not on file  Stress:   . Feeling of Stress : Not on file  Social Connections:   . Frequency of Communication with Friends and Family: Not on file  . Frequency of Social Gatherings with Friends and Family: Not on file  . Attends Religious Services: Not on file  . Active Member of Clubs or Organizations: Not on file  . Attends Archivist Meetings: Not on file  . Marital Status: Not  on file    Review of Systems: General: Negative for anorexia, weight loss, fever, chills, fatigue, weakness. ENT: Negative for hoarseness, difficulty swallowing. CV: Negative for chest pain, angina, palpitations, peripheral edema.  Respiratory: Negative for dyspnea at rest, cough, sputum, wheezing.  GI: See history of present illness. Endo: Negative for unusual weight change.  Heme: Negative for bruising or bleeding. Allergy: Negative for rash or hives.   Physical Exam: BP (!) 150/84   Pulse 87   Temp (!) 96.6 F (35.9 C) (Temporal)   Ht 5\' 6"  (1.676 m)   Wt 225 lb 6.4 oz (102.2 kg)   BMI 36.38 kg/m  General:   Alert and oriented. Pleasant and cooperative. Well-nourished and well-developed.  Eyes:  Without icterus, sclera clear and conjunctiva pink.  Ears:  Normal auditory acuity. Cardiovascular:  S1, S2 present without murmurs appreciated. Extremities without clubbing or edema. Respiratory:  Clear to auscultation bilaterally. No wheezes, rales, or rhonchi. No distress.  Gastrointestinal:  +BS, soft, non-tender and non-distended. No HSM noted. No guarding or rebound. No masses appreciated.  Rectal:  Deferred  Musculoskalatal:  Symmetrical without gross deformities. Neurologic:  Alert and oriented x4;  grossly normal neurologically. Psych:  Alert and cooperative. Normal mood and affect. Heme/Lymph/Immune: No excessive bruising noted.    07/30/2019 10:01 AM   Disclaimer: This note was dictated with voice recognition software. Similar sounding words can inadvertently be transcribed and may not be corrected upon review.

## 2019-07-30 NOTE — Assessment & Plan Note (Signed)
GERD symptoms doing well with over-the-counter therapy as needed.  Recommend she continue her therapy, call us for any worsening or severe symptoms.  Return for follow-up as needed based on good symptom management over the past 1 to 2 years.

## 2019-07-30 NOTE — Assessment & Plan Note (Signed)
Hemorrhoids doing well, have responded to topical therapy.  No further rectal bleeding or hemorrhoid symptoms.  Recommend she continue topical therapy and call us if she has any worsening or severe symptoms.  Follow-up as needed.

## 2019-07-30 NOTE — Assessment & Plan Note (Addendum)
No further dysphagia since her EGD.  Recommend she continue to monitor for any recurrence and notify us of this.  Of note she will be due for a repeat colonoscopy in 2022 and I have confirmed she is on the recall list.

## 2019-07-30 NOTE — Patient Instructions (Signed)
Your health issues we discussed today were:   GERD (reflux/heartburn) with previous swallowing difficulties: 1. I am glad your symptoms are doing well! 2. Continue taking over-the-counter medications to help your reflux symptoms, as you have been 3. Avoid trigger foods that make your symptoms worse (apparently greasy foods) 4. Because of you have any worsening or severe symptoms  Hemorrhoids: 1. Continue using either Preparation H and/or Anusol (prescription cream sent to your pharmacy) as needed for hemorrhoid symptoms 2. Call us if you have any worsening or significant symptoms  Overall I recommend:  1. Continue your other current medications 2. Return for follow-up as needed for GI symptoms 3. You will be due for repeat colonoscopy in 2022 and we will reach out to you at that time 4. Call us if you have any questions or concerns.   Because of recent events of COVID-19 ("Coronavirus"), follow CDC recommendations:  1. Wash your hand frequently 2. Avoid touching your face 3. Stay away from people who are sick 4. If you have symptoms such as fever, cough, shortness of breath then call your healthcare provider for further guidance 5. If you are sick, STAY AT HOME unless otherwise directed by your healthcare provider. 6. Follow directions from state and national officials regarding staying safe   At [facility] we value your feedback. You may receive a survey about your visit today. Please share your experience as we strive to create trusting relationships with our patients to provide genuine, compassionate, quality care.  We appreciate your understanding and patience as we review any laboratory studies, imaging, and other diagnostic tests that are ordered as we care for you. Our office policy is 5 business days for review of these results, and any emergent or urgent results are addressed in a timely manner for your best interest. If you do not hear from our office in 1  week, please contact us.   We also encourage the use of MyChart, which contains your medical information for your review as well. If you are not enrolled in this feature, an access code is on this after visit summary for your convenience. Thank you for allowing [place] to be involved in your care.  It was great to see you today!  I hope you have a Merry Christmas and Happy Holidays!!

## 2019-08-03 ENCOUNTER — Other Ambulatory Visit: Payer: Self-pay | Admitting: Family Medicine

## 2019-08-22 ENCOUNTER — Encounter (INDEPENDENT_AMBULATORY_CARE_PROVIDER_SITE_OTHER): Payer: Self-pay

## 2019-08-22 ENCOUNTER — Encounter: Payer: Self-pay | Admitting: Family Medicine

## 2019-08-22 ENCOUNTER — Ambulatory Visit (INDEPENDENT_AMBULATORY_CARE_PROVIDER_SITE_OTHER): Payer: PPO | Admitting: Family Medicine

## 2019-08-22 ENCOUNTER — Other Ambulatory Visit: Payer: Self-pay

## 2019-08-22 VITALS — BP 136/80 | HR 90 | Temp 97.5°F | Resp 15 | Ht 66.0 in | Wt 226.0 lb

## 2019-08-22 DIAGNOSIS — E785 Hyperlipidemia, unspecified: Secondary | ICD-10-CM

## 2019-08-22 DIAGNOSIS — Z780 Asymptomatic menopausal state: Secondary | ICD-10-CM

## 2019-08-22 DIAGNOSIS — Z1231 Encounter for screening mammogram for malignant neoplasm of breast: Secondary | ICD-10-CM

## 2019-08-22 DIAGNOSIS — R7301 Impaired fasting glucose: Secondary | ICD-10-CM

## 2019-08-22 DIAGNOSIS — Z0000 Encounter for general adult medical examination without abnormal findings: Secondary | ICD-10-CM

## 2019-08-22 DIAGNOSIS — I10 Essential (primary) hypertension: Secondary | ICD-10-CM

## 2019-08-22 DIAGNOSIS — E559 Vitamin D deficiency, unspecified: Secondary | ICD-10-CM

## 2019-08-22 MED ORDER — ESTRADIOL 0.1 MG/GM VA CREA: TOPICAL_CREAM | VAGINAL | 3 refills | Status: DC

## 2019-08-22 NOTE — Progress Notes (Signed)
    Sarah Coleman     MRN: [id number]      DOB: Dec 30, 1950  HPI: Patient is in for annual physical exam. No other health concerns are expressed or addressed at the visit. R Immunization is reviewed , and  updated if needed.   PE: BP 136/80   Pulse 90   Temp (!) 97.5 F (36.4 C) (Temporal)   Resp 15   Ht 5\' 6"  (1.676 m)   Wt 226 lb (102.5 kg)   SpO2 98%   BMI 36.48 kg/m   Pleasant  female, alert and oriented x 3, in no cardio-pulmonary distress. Afebrile. HEENT No facial trauma or asymetry. Sinuses non tender.  Extra occullar muscles intact.. External ears normal, . Neck: supple, no adenopathy,JVD or thyromegaly.No bruits.  Chest: Clear to ascultation bilaterally.No crackles or wheezes. Non tender to palpation  Breast: No asymetry,no masses or lumps. No tenderness. No nipple discharge or inversion. No axillary or supraclavicular adenopathy  Cardiovascular system; Heart sounds normal,  S1 and  S2 ,no S3.  No murmur, or thrill. Apical beat not displaced Peripheral pulses normal.  Abdomen: Soft, non tender, no organomegaly or masses. No bruits. Bowel sounds normal. No guarding, tenderness or rebound.     Musculoskeletal exam: Full ROM of spine, hips , shoulders and knees. No deformity ,swelling or crepitus noted. No muscle wasting or atrophy.   Neurologic: Cranial nerves 2 to 12 intact. Power, tone ,sensation and reflexes normal throughout. No disturbance in gait. No tremor.  Skin: Intact, hyperpigmented scaling rash noted on forehead Vitiligo present  Psych; Normal mood and affect. Judgement and concentration normal   Assessment & Plan:  Annual physical exam Annual exam as documented. Counseling done  re healthy lifestyle involving commitment to 150 minutes exercise per week, heart healthy diet, and attaining healthy weight.The importance of adequate sleep also discussed. Regular seat belt use and home safety, is also discussed. Changes in  health habits are decided on by the patient with goals and time frames  set for achieving them. Immunization and cancer screening needs are specifically addressed at this visit.

## 2019-08-22 NOTE — Patient Instructions (Addendum)
F/U in  Early July   with MD in office , re evaluate weight and chronic health conditions  Please schedule Bone density, and mammogram same date and approx time in June   Please get fasting CBC, lipid, cmp and EGFR, tSH and vit D and HBA1C Feb 8 or after   It is important that you exercise regularly at least 30 minutes 5 times a week. If you develop chest pain, have severe difficulty breathing, or feel very tired, stop exercising immediately and seek medical attention   Think about what you will eat, plan ahead. Choose " clean, green, fresh or frozen" over canned, processed or packaged foods which are more sugary, salty and fatty. 70 to 75% of food eaten should be vegetables and fruit. Three meals at set times with snacks allowed between meals, but they must be fruit or vegetables. Aim to eat over a 12 hour period , example 7 am to 7 pm, and STOP after  your last meal of the day. Drink water,generally about 64 ounces per day, no other drink is as healthy. Fruit juice is best enjoyed in a healthy way, by EATING the fruit.  Thanks for choosing [facility], we consider it a privelige to serve you. Best for 2021!

## 2019-08-23 ENCOUNTER — Encounter: Payer: Self-pay | Admitting: Family Medicine

## 2019-08-23 NOTE — Assessment & Plan Note (Signed)

## 2019-09-17 ENCOUNTER — Other Ambulatory Visit: Payer: Self-pay | Admitting: Family Medicine

## 2019-09-19 ENCOUNTER — Other Ambulatory Visit: Payer: Self-pay | Admitting: Family Medicine

## 2019-10-15 ENCOUNTER — Other Ambulatory Visit: Payer: Self-pay | Admitting: Family Medicine

## 2019-10-24 ENCOUNTER — Other Ambulatory Visit: Payer: Self-pay | Admitting: Family Medicine

## 2019-10-24 ENCOUNTER — Ambulatory Visit: Payer: PPO | Attending: Internal Medicine

## 2019-10-24 DIAGNOSIS — Z23 Encounter for immunization: Secondary | ICD-10-CM

## 2019-10-24 NOTE — Progress Notes (Signed)
   Covid-19 Vaccination Clinic  Name:  SETH BORRUSO    MRN: [id number] DOB: 05/23/1951  10/24/2019  Ms. Sawin was observed post Covid-19 immunization for 15 minutes without incident. She was provided with Vaccine Information Sheet and instruction to access the V-Safe system.   Ms. Brenneman was instructed to call 911 with any severe reactions post vaccine: Marland Kitchen Difficulty breathing  . Swelling of face and throat  . A fast heartbeat  . A bad rash all over body  . Dizziness and weakness   Immunizations Administered    Name Date Dose VIS Date Route   Moderna COVID-19 Vaccine 10/24/2019  9:21 AM 0.5 mL 07/16/2019 Intramuscular   Manufacturer: Moderna   Lot: [id number]   [id number]

## 2019-11-10 ENCOUNTER — Ambulatory Visit: Payer: PPO

## 2019-11-26 ENCOUNTER — Ambulatory Visit: Payer: PPO | Attending: Internal Medicine

## 2019-11-26 DIAGNOSIS — Z23 Encounter for immunization: Secondary | ICD-10-CM

## 2019-11-26 NOTE — Progress Notes (Signed)
   Covid-19 Vaccination Clinic  Name:  Sarah Coleman    MRN: [id number] DOB: 1951-07-29  11/26/2019  [name] was observed post Covid-19 immunization for 15 minutes without incident. She was provided with Vaccine Information Sheet and instruction to access the V-Safe system.   [name] was instructed to call 911 with any severe reactions post vaccine: Marland Kitchen Difficulty breathing  . Swelling of face and throat  . A fast heartbeat  . A bad rash all over body  . Dizziness and weakness   Immunizations Administered    Name Date Dose VIS Date Route   Moderna COVID-19 Vaccine 11/26/2019  8:46 AM 0.5 mL 07/16/2019 Intramuscular   Manufacturer: Moderna   Lot: [id number]   [id number]

## 2019-12-01 ENCOUNTER — Other Ambulatory Visit: Payer: Self-pay | Admitting: Family Medicine

## 2019-12-27 ENCOUNTER — Other Ambulatory Visit: Payer: Self-pay | Admitting: Cardiology

## 2020-01-13 ENCOUNTER — Other Ambulatory Visit: Payer: Self-pay | Admitting: Family Medicine

## 2020-01-17 ENCOUNTER — Other Ambulatory Visit: Payer: Self-pay | Admitting: Family Medicine

## 2020-01-27 ENCOUNTER — Other Ambulatory Visit: Payer: Self-pay

## 2020-01-27 ENCOUNTER — Telehealth: Payer: Self-pay

## 2020-01-27 MED ORDER — POTASSIUM CHLORIDE CRYS ER 20 MEQ PO TBCR: EXTENDED_RELEASE_TABLET | ORAL | 1 refills | Status: DC

## 2020-01-27 NOTE — Telephone Encounter (Signed)
Pt needs her Potassium called in to [facility]

## 2020-01-27 NOTE — Telephone Encounter (Signed)
Rx sent 

## 2020-01-30 ENCOUNTER — Other Ambulatory Visit: Payer: Self-pay | Admitting: Cardiology

## 2020-02-10 ENCOUNTER — Inpatient Hospital Stay (HOSPITAL_COMMUNITY): Admission: RE | Admit: 2020-02-10 | Payer: PPO | Source: Ambulatory Visit

## 2020-02-10 ENCOUNTER — Other Ambulatory Visit (HOSPITAL_COMMUNITY): Payer: PPO

## 2020-02-17 ENCOUNTER — Other Ambulatory Visit: Payer: Self-pay | Admitting: Family Medicine

## 2020-02-26 ENCOUNTER — Ambulatory Visit: Payer: PPO | Admitting: Family Medicine

## 2020-03-10 ENCOUNTER — Ambulatory Visit (INDEPENDENT_AMBULATORY_CARE_PROVIDER_SITE_OTHER): Payer: PPO | Admitting: Family Medicine

## 2020-03-10 ENCOUNTER — Other Ambulatory Visit: Payer: Self-pay

## 2020-03-10 ENCOUNTER — Encounter: Payer: Self-pay | Admitting: Family Medicine

## 2020-03-10 VITALS — BP 138/85 | HR 90 | Resp 16 | Ht 66.0 in | Wt 222.0 lb

## 2020-03-10 DIAGNOSIS — I10 Essential (primary) hypertension: Secondary | ICD-10-CM | POA: Diagnosis not present

## 2020-03-10 DIAGNOSIS — R7303 Prediabetes: Secondary | ICD-10-CM

## 2020-03-10 DIAGNOSIS — E8881 Metabolic syndrome: Secondary | ICD-10-CM | POA: Diagnosis not present

## 2020-03-10 DIAGNOSIS — E559 Vitamin D deficiency, unspecified: Secondary | ICD-10-CM | POA: Diagnosis not present

## 2020-03-10 DIAGNOSIS — R8290 Unspecified abnormal findings in urine: Secondary | ICD-10-CM | POA: Diagnosis not present

## 2020-03-10 DIAGNOSIS — R350 Frequency of micturition: Secondary | ICD-10-CM | POA: Diagnosis not present

## 2020-03-10 DIAGNOSIS — E785 Hyperlipidemia, unspecified: Secondary | ICD-10-CM

## 2020-03-10 DIAGNOSIS — N907 Vulvar cyst: Secondary | ICD-10-CM | POA: Insufficient documentation

## 2020-03-10 DIAGNOSIS — J309 Allergic rhinitis, unspecified: Secondary | ICD-10-CM

## 2020-03-10 DIAGNOSIS — N898 Other specified noninflammatory disorders of vagina: Secondary | ICD-10-CM

## 2020-03-10 LAB — POCT URINALYSIS DIP (CLINITEK)
Bilirubin, UA: NEGATIVE
Blood, UA: NEGATIVE
Glucose, UA: 100 mg/dL — AB
Ketones, POC UA: NEGATIVE mg/dL
Leukocytes, UA: NEGATIVE
Nitrite, UA: POSITIVE — AB
Spec Grav, UA: 1.02 (ref 1.010–1.025)
Urobilinogen, UA: 1 E.U./dL
pH, UA: 7 (ref 5.0–8.0)

## 2020-03-10 LAB — POCT GLYCOSYLATED HEMOGLOBIN (HGB A1C)
HbA1c POC (<> result, manual entry): 5.9 % (ref 4.0–5.6)
HbA1c, POC (controlled diabetic range): 5.9 % (ref 0.0–7.0)
HbA1c, POC (prediabetic range): 5.9 % (ref 5.7–6.4)
Hemoglobin A1C: 5.9 % — AB (ref 4.0–5.6)

## 2020-03-10 MED ORDER — FLUCONAZOLE 150 MG PO TABS
150.0000 mg | ORAL_TABLET | Freq: Once | ORAL | 0 refills | Status: DC
Start: 2020-03-10 — End: 2020-04-30

## 2020-03-10 MED ORDER — SULFAMETHOXAZOLE-TRIMETHOPRIM 800-160 MG PO TABS: 1.0000 | ORAL_TABLET | Freq: Two times a day (BID) | ORAL | 0 refills | Status: DC

## 2020-03-10 NOTE — Progress Notes (Signed)
Sarah Coleman     MRN: [id number]      DOB: 12/05/1950   HPI [name] is here for follow up and re-evaluation of chronic medical conditions, medication management and review of any available recent lab and radiology data.  Preventive health is updated, specifically  Cancer screening and Immunization.   Questions or concerns regarding consultations or procedures which the PT has had in the interim are  addressed. The PT denies any adverse reactions to current medications since the last visit.  1 month h/o lower abd pian and dysuruia and odor  Itchy nodule on right labia at top x 3 month, burst area and also used antibiotixc topically  ROS Denies recent fever or chills. Denies sinus pressure, nasal congestion, ear pain or sore throat. Denies chest congestion, productive cough or wheezing. Denies chest pains, palpitations and leg swelling Denies abdominal pain, nausea, vomiting,diarrhea or constipation.   . Denies headaches, seizures, numbness, or tingling. Denies depression, anxiety or insomnia. C/o malodorous urine and frequency at night  PE  BP (!) 138/85   Pulse 90   Resp 16   Ht 5\' 6"  (1.676 m)   Wt (!) 222 lb (100.7 kg)   SpO2 94%   BMI 35.83 kg/m   Patient alert and oriented and in no cardiopulmonary distress.  HEENT: No facial asymmetry, EOMI,     Neck supple .  Chest: Clear to auscultation bilaterally.  CVS: S1, S2 no murmurs, no S3.Regular rate.  ABD: Soft non tender.   Ext: No edema  MS: Adequate though reduced  ROM spine, shoulders, hips and knees.  Skin: Intact, no ulcerations or rash noted.  Psych: Good eye contact, normal affect. Memory intact not anxious or depressed appearing.  CNS: CN 2-12 intact, power,  normal throughout.no focal deficits noted.   Assessment & Plan  Essential hypertension Controlled, no change in medication DASH diet and commitment to daily physical activity for a minimum of 30 minutes discussed and encouraged, as a part  of hypertension management. The importance of attaining a healthy weight is also discussed.  BP/Weight 03/10/2020 08/22/2019 07/30/2019 07/16/2019 03/01/2019 01/29/2019 2/68/3419  Systolic BP 622 297 989 211 941 740 814  Diastolic BP 85 80 84 87 81 81 78  Wt. (Lbs) 222 226 225.4 210 215 220 229  BMI 35.83 36.48 36.38 33.89 34.7 35.51 36.96       Hyperlipidemia LDL goal <100 Hyperlipidemia:Low fat diet discussed and encouraged.   Lipid Panel  Lab Results  Component Value Date   CHOL 161 03/11/2020   HDL 33 (L) 03/11/2020   LDLCALC 78 03/11/2020   LDLDIRECT 69 02/13/2009   TRIG 310 (H) 03/11/2020   CHOLHDL 4.9 (H) 03/11/2020  needs to reduce fat in diet, tG high     Morbid obesity (HCC) Obesity associated with hTN and arthritis  Patient re-educated about  the importance of commitment to a  minimum of 150 minutes of exercise per week as able.  The importance of healthy food choices with portion control discussed, as well as eating regularly and within a 12 hour window most days. The need to choose "clean , green" food 50 to 75% of the time is discussed, as well as to make water the primary drink and set a goal of 64 ounces water daily.    Weight /BMI 03/10/2020 08/22/2019 07/30/2019  WEIGHT 222 lb 226 lb 225 lb 6.4 oz  HEIGHT 5\' 6"  5\' 6"  5\' 6"   BMI 35.83 kg/m2 36.48 kg/m2 36.38  kg/m2      Prediabetes Patient educated about the importance of limiting  Carbohydrate intake , the need to commit to daily physical activity for a minimum of 30 minutes , and to commit weight loss. The fact that changes in all these areas will reduce or eliminate all together the development of diabetes is stressed.   Diabetic Labs Latest Ref Rng & Units 03/11/2020 03/10/2020 03/10/2020 03/10/2020 03/10/2020  HbA1c 4.0 - 5.6 % - 5.9 5.9 5.9 5.9(A)  Chol 100 - 199 mg/dL 161 - - - -  HDL >39 mg/dL 33(L) - - - -  Calc LDL 0 - 99 mg/dL 78 - - - -  Triglycerides 0 - 149 mg/dL 310(H) - - - -  Creatinine  0.57 - 1.00 mg/dL 1.01(H) - - - -   BP/Weight 03/10/2020 08/22/2019 07/30/2019 07/16/2019 03/01/2019 01/29/2019 08/26/1622  Systolic BP 469 507 225 750 518 335 825  Diastolic BP 85 80 84 87 81 81 78  Wt. (Lbs) 222 226 225.4 210 215 220 229  BMI 35.83 36.48 36.38 33.89 34.7 35.51 36.96   No flowsheet data found.    Metabolic syndrome X The increased risk of cardiovascular disease associated with this diagnosis, and the need to consistently work on lifestyle to change this is discussed. Following  a  heart healthy diet ,commitment to 30 minutes of exercise at least 5 days per week, as well as control of blood sugar and cholesterol , and achieving a healthy weight are all the areas to be addressed .   Allergic rhinitis Controlled, no change in medication   Labial cyst 3 month h/o painful labial cyst which is pruritic, refer Gyne  Malodorous urine Symptomatic with abnormal CCUA send for c/s

## 2020-03-10 NOTE — Patient Instructions (Signed)
Follow-up in office with MD in 4 months call if you need me sooner.  Please get fasting CBC lipid CMP and EGFR TSH and vitamin D in the next 1 week.  Mammogram and DEXA to be scheduled at [place] on improved/ blood sugar  You are referred gynecology regarding the cyst that we discussed.  You appear to have a urinary tract infection and a 5-day course of an antibiotic Septra and 1 fluconazole pill for yeast are prescribed.  Think about what you will eat, plan ahead. Choose " clean, green, fresh or frozen" over canned, processed or packaged foods which are more sugary, salty and fatty. 70 to 75% of food eaten should be vegetables and fruit. Three meals at set times with snacks allowed between meals, but they must be fruit or vegetables. Aim to eat over a 12 hour period , example 7 am to 7 pm, and STOP after  your last meal of the day. Drink water,generally about 64 ounces per day, no other drink is as healthy. Fruit juice is best enjoyed in a healthy way, by EATING the fruit. It is important that you exercise regularly at least 30 minutes 5 times a week. If you develop chest pain, have severe difficulty breathing, or feel very tired, stop exercising immediately and seek medical attention  Thanks for choosing [facility], we consider it a privelige to serve you.

## 2020-03-11 ENCOUNTER — Ambulatory Visit: Payer: PPO | Admitting: Family Medicine

## 2020-03-11 ENCOUNTER — Other Ambulatory Visit (HOSPITAL_COMMUNITY)
Admission: RE | Admit: 2020-03-11 | Discharge: 2020-03-11 | Disposition: A | Discharge status: Home / Self Care | Payer: PPO | Source: Other Acute Inpatient Hospital | Attending: Family Medicine | Admitting: Family Medicine

## 2020-03-11 DIAGNOSIS — R350 Frequency of micturition: Secondary | ICD-10-CM | POA: Insufficient documentation

## 2020-03-11 DIAGNOSIS — E8881 Metabolic syndrome: Secondary | ICD-10-CM | POA: Diagnosis not present

## 2020-03-11 DIAGNOSIS — E559 Vitamin D deficiency, unspecified: Secondary | ICD-10-CM | POA: Diagnosis not present

## 2020-03-11 DIAGNOSIS — E785 Hyperlipidemia, unspecified: Secondary | ICD-10-CM | POA: Diagnosis not present

## 2020-03-11 DIAGNOSIS — I10 Essential (primary) hypertension: Secondary | ICD-10-CM | POA: Diagnosis not present

## 2020-03-11 DIAGNOSIS — R8290 Unspecified abnormal findings in urine: Secondary | ICD-10-CM | POA: Insufficient documentation

## 2020-03-12 LAB — URINE CULTURE: Culture: NO GROWTH

## 2020-03-12 LAB — CBC
Hematocrit: 36.9 % (ref 34.0–46.6)
Hemoglobin: 12.4 g/dL (ref 11.1–15.9)
MCH: 30.5 pg (ref 26.6–33.0)
MCHC: 33.6 g/dL (ref 31.5–35.7)
MCV: 91 fL (ref 79–97)
Platelets: 321 x10E3/uL (ref 150–450)
RBC: 4.06 x10E6/uL (ref 3.77–5.28)
RDW: 13.7 % (ref 11.7–15.4)
WBC: 6.8 x10E3/uL (ref 3.4–10.8)

## 2020-03-12 LAB — CMP14+EGFR
ALT: 19 IU/L (ref 0–32)
AST: 17 IU/L (ref 0–40)
Albumin/Globulin Ratio: 1.8 (ref 1.2–2.2)
Albumin: 4.8 g/dL (ref 3.8–4.8)
Alkaline Phosphatase: 74 IU/L (ref 48–121)
BUN/Creatinine Ratio: 9 — ABNORMAL LOW (ref 12–28)
BUN: 9 mg/dL (ref 8–27)
Bilirubin Total: 0.5 mg/dL (ref 0.0–1.2)
CO2: 23 mmol/L (ref 20–29)
Calcium: 10 mg/dL (ref 8.7–10.3)
Chloride: 100 mmol/L (ref 96–106)
Creatinine, Ser: 1.01 mg/dL — ABNORMAL HIGH (ref 0.57–1.00)
GFR calc Af Amer: 65 mL/min/{1.73_m2} (ref >59)
GFR calc non Af Amer: 57 mL/min/{1.73_m2} — ABNORMAL LOW (ref >59)
Globulin, Total: 2.6 g/dL (ref 1.5–4.5)
Glucose: 112 mg/dL — ABNORMAL HIGH (ref 65–99)
Potassium: 3.9 mmol/L (ref 3.5–5.2)
Sodium: 139 mmol/L (ref 134–144)
Total Protein: 7.4 g/dL (ref 6.0–8.5)

## 2020-03-12 LAB — LIPID PANEL
Chol/HDL Ratio: 4.9 ratio — ABNORMAL HIGH (ref 0.0–4.4)
Cholesterol, Total: 161 mg/dL (ref 100–199)
HDL: 33 mg/dL — ABNORMAL LOW (ref >39)
LDL Chol Calc (NIH): 78 mg/dL (ref 0–99)
Triglycerides: 310 mg/dL — ABNORMAL HIGH (ref 0–149)
VLDL Cholesterol Cal: 50 mg/dL — ABNORMAL HIGH (ref 5–40)

## 2020-03-12 LAB — VITAMIN D 25 HYDROXY (VIT D DEFICIENCY, FRACTURES): Vit D, 25-Hydroxy: 47.3 ng/mL (ref 30.0–100.0)

## 2020-03-12 LAB — TSH: TSH: 0.867 u[IU]/mL (ref 0.450–4.500)

## 2020-03-14 ENCOUNTER — Encounter: Payer: Self-pay | Admitting: Family Medicine

## 2020-03-14 DIAGNOSIS — R8290 Unspecified abnormal findings in urine: Secondary | ICD-10-CM | POA: Insufficient documentation

## 2020-03-14 NOTE — Assessment & Plan Note (Signed)
3 month h/o painful labial cyst which is pruritic, refer Gyne

## 2020-03-14 NOTE — Assessment & Plan Note (Signed)
Symptomatic with abnormal CCUA send for c/s

## 2020-03-14 NOTE — Assessment & Plan Note (Signed)
The increased risk of cardiovascular disease associated with this diagnosis, and the need to consistently work on lifestyle to change this is discussed. Following  a  heart healthy diet ,commitment to 30 minutes of exercise at least 5 days per week, as well as control of blood sugar and cholesterol , and achieving a healthy weight are all the areas to be addressed .  

## 2020-03-14 NOTE — Assessment & Plan Note (Signed)
Controlled, no change in medication  

## 2020-03-14 NOTE — Assessment & Plan Note (Signed)
Controlled, no change in medication DASH diet and commitment to daily physical activity for a minimum of 30 minutes discussed and encouraged, as a part of hypertension management. The importance of attaining a healthy weight is also discussed.  BP/Weight 03/10/2020 08/22/2019 07/30/2019 07/16/2019 03/01/2019 01/29/2019 3/66/8159  Systolic BP 470 761 518 343 735 789 784  Diastolic BP 85 80 84 87 81 81 78  Wt. (Lbs) 222 226 225.4 210 215 220 229  BMI 35.83 36.48 36.38 33.89 34.7 35.51 36.96

## 2020-03-14 NOTE — Assessment & Plan Note (Signed)
Patient educated about the importance of limiting  Carbohydrate intake , the need to commit to daily physical activity for a minimum of 30 minutes , and to commit weight loss. The fact that changes in all these areas will reduce or eliminate all together the development of diabetes is stressed.   Diabetic Labs Latest Ref Rng & Units 03/11/2020 03/10/2020 03/10/2020 03/10/2020 03/10/2020  HbA1c 4.0 - 5.6 % - 5.9 5.9 5.9 5.9(A)  Chol 100 - 199 mg/dL 161 - - - -  HDL >39 mg/dL 33(L) - - - -  Calc LDL 0 - 99 mg/dL 78 - - - -  Triglycerides 0 - 149 mg/dL 310(H) - - - -  Creatinine 0.57 - 1.00 mg/dL 1.01(H) - - - -   BP/Weight 03/10/2020 08/22/2019 07/30/2019 07/16/2019 03/01/2019 01/29/2019 1/77/1165  Systolic BP 790 383 338 329 191 660 600  Diastolic BP 85 80 84 87 81 81 78  Wt. (Lbs) 222 226 225.4 210 215 220 229  BMI 35.83 36.48 36.38 33.89 34.7 35.51 36.96   No flowsheet data found.

## 2020-03-14 NOTE — Assessment & Plan Note (Signed)
Hyperlipidemia:Low fat diet discussed and encouraged.   Lipid Panel  Lab Results  Component Value Date   CHOL 161 03/11/2020   HDL 33 (L) 03/11/2020   LDLCALC 78 03/11/2020   LDLDIRECT 69 02/13/2009   TRIG 310 (H) 03/11/2020   CHOLHDL 4.9 (H) 03/11/2020  needs to reduce fat in diet, tG high

## 2020-03-14 NOTE — Assessment & Plan Note (Signed)
Obesity associated with hTN and arthritis  Patient re-educated about  the importance of commitment to a  minimum of 150 minutes of exercise per week as able.  The importance of healthy food choices with portion control discussed, as well as eating regularly and within a 12 hour window most days. The need to choose "clean , green" food 50 to 75% of the time is discussed, as well as to make water the primary drink and set a goal of 64 ounces water daily.    Weight /BMI 03/10/2020 08/22/2019 07/30/2019  WEIGHT 222 lb 226 lb 225 lb 6.4 oz  HEIGHT 5\' 6"  5\' 6"  5\' 6"   BMI 35.83 kg/m2 36.48 kg/m2 36.38 kg/m2

## 2020-03-25 ENCOUNTER — Telehealth: Payer: Self-pay

## 2020-03-25 NOTE — Telephone Encounter (Signed)
Patient will need an ov. Thank you

## 2020-03-25 NOTE — Telephone Encounter (Signed)
Pt stating that any time she eats a citrus fruit or decaf coffee that it upsets her stomach wondering if this is normal or what could be wrong. Please advise the pt on this matter.

## 2020-03-26 ENCOUNTER — Telehealth (INDEPENDENT_AMBULATORY_CARE_PROVIDER_SITE_OTHER): Payer: PPO | Admitting: Family Medicine

## 2020-03-26 ENCOUNTER — Encounter: Payer: Self-pay | Admitting: Family Medicine

## 2020-03-26 ENCOUNTER — Other Ambulatory Visit: Payer: Self-pay

## 2020-03-26 VITALS — BP 127/75 | Ht 66.0 in | Wt 222.0 lb

## 2020-03-26 DIAGNOSIS — R12 Heartburn: Secondary | ICD-10-CM | POA: Diagnosis not present

## 2020-03-26 MED ORDER — FAMOTIDINE 40 MG PO TABS: ORAL_TABLET | ORAL | 0 refills | Status: DC

## 2020-03-26 NOTE — Progress Notes (Signed)
Virtual Visit via Telephone Note  I connected with Sarah Coleman on 03/26/20 at  1:00 PM EDT by telephone and verified that I am speaking with the correct person using two identifiers.  Location: Patient: home Provider: office   I discussed the limitations, risks, security and privacy concerns of performing an evaluation and management service by telephone and the availability of in person appointments. I also discussed with the patient that there may be a patient responsible charge related to this service. The patient expressed understanding and agreed to proceed.   History of Present Illness: Has been eatijg a lot of tomato ad this normally causes her stomach to burn  having burning stomach pain over the last 3 days  Denies nausea, vomit, hematemesis, back stool or BRRB Denies fever or chills CVS: Denies chest pain, palpitations, PND , orthopnea or leg edema Observations/Objective: BP 127/75   Ht 5\' 6"  (1.676 m)   Wt 222 lb (100.7 kg)   BMI 35.83 kg/m   Good communication with no confusion and intact memory. Alert and oriented x 3 No signs of respiratory distress during speech    Assessment and Plan:  Heartburn Symptomatic, trigger ius excess tomatoes, short course of pepcid and rduce acid intake, call if persists or worsens  Morbid obesity ([place]) Obesity associated with HTN and arthritis  Patient re-educated about  the importance of commitment to a  minimum of 150 minutes of exercise per week as able.  The importance of healthy food choices with portion control discussed, as well as eating regularly and within a 12 hour window most days. The need to choose "clean , green" food 50 to 75% of the time is discussed, as well as to make water the primary drink and set a goal of 64 ounces water daily.    Weight /BMI 03/26/2020 03/10/2020 08/22/2019  WEIGHT 222 lb 222 lb 226 lb  HEIGHT 5\' 6"  5\' 6"  5\' 6"   BMI 35.83 kg/m2 35.83 kg/m2 36.48 kg/m2      Follow Up  Instructions:    I discussed the assessment and treatment plan with the patient. The patient was provided an opportunity to ask questions and all were answered. The patient agreed with the plan and demonstrated an understanding of the instructions.   The patient was advised to call back or seek an in-person evaluation if the symptoms worsen or if the condition fails to improve as anticipated.  I provided 10 minutes of non-face-to-face time during this encounter.   Tula Nakayama, MD

## 2020-03-26 NOTE — Patient Instructions (Signed)
F/U as before, call if you need me sooner  Please cut back on he number of tomatoes that you are eating since they cause heartburn  I have prescribed Pepcid 40 mg take one tablet once daily, as needed, for heartburn  It is important that you exercise regularly at least 30 minutes 5 times a week. If you develop chest pain, have severe difficulty breathing, or feel very tired, stop exercising immediately and seek medical attention  Think about what you will eat, plan ahead. Choose " clean, green, fresh or frozen" over canned, processed or packaged foods which are more sugary, salty and fatty. 70 to 75% of food eaten should be vegetables and fruit. Three meals at set times with snacks allowed between meals, but they must be fruit or vegetables. Aim to eat over a 12 hour period , example 7 am to 7 pm, and STOP after  your last meal of the day. Drink water,generally about 64 ounces per day, no other drink is as healthy. Fruit juice is best enjoyed in a healthy way, by EATING the fruit. Thanks for choosing [facility], we consider it a privelige to serve you.

## 2020-03-28 ENCOUNTER — Encounter: Payer: Self-pay | Admitting: Family Medicine

## 2020-03-28 NOTE — Assessment & Plan Note (Signed)
Obesity associated with HTN and arthritis  Patient re-educated about  the importance of commitment to a  minimum of 150 minutes of exercise per week as able.  The importance of healthy food choices with portion control discussed, as well as eating regularly and within a 12 hour window most days. The need to choose "clean , green" food 50 to 75% of the time is discussed, as well as to make water the primary drink and set a goal of 64 ounces water daily.    Weight /BMI 03/26/2020 03/10/2020 08/22/2019  WEIGHT 222 lb 222 lb 226 lb  HEIGHT 5\' 6"  5\' 6"  5\' 6"   BMI 35.83 kg/m2 35.83 kg/m2 36.48 kg/m2

## 2020-03-28 NOTE — Assessment & Plan Note (Signed)
Symptomatic, trigger ius excess tomatoes, short course of pepcid and rduce acid intake, call if persists or worsens

## 2020-03-30 ENCOUNTER — Other Ambulatory Visit: Payer: Self-pay

## 2020-03-30 ENCOUNTER — Ambulatory Visit: Payer: PPO | Admitting: Adult Health

## 2020-03-30 ENCOUNTER — Ambulatory Visit (HOSPITAL_COMMUNITY): Payer: PPO

## 2020-03-30 ENCOUNTER — Other Ambulatory Visit (HOSPITAL_COMMUNITY): Payer: PPO

## 2020-03-30 ENCOUNTER — Encounter: Payer: Self-pay | Admitting: Adult Health

## 2020-03-30 VITALS — BP 135/85 | HR 74 | Ht 66.0 in | Wt 222.0 lb

## 2020-03-30 DIAGNOSIS — N907 Vulvar cyst: Secondary | ICD-10-CM | POA: Insufficient documentation

## 2020-03-30 DIAGNOSIS — B379 Candidiasis, unspecified: Secondary | ICD-10-CM | POA: Diagnosis not present

## 2020-03-30 DIAGNOSIS — N898 Other specified noninflammatory disorders of vagina: Secondary | ICD-10-CM | POA: Insufficient documentation

## 2020-03-30 DIAGNOSIS — L80 Vitiligo: Secondary | ICD-10-CM | POA: Diagnosis not present

## 2020-03-30 LAB — POCT WET PREP (WET MOUNT)

## 2020-03-30 MED ORDER — FLUCONAZOLE 150 MG PO TABS
ORAL_TABLET | ORAL | 1 refills | Status: DC
Start: 2020-03-30 — End: 2020-04-03

## 2020-03-30 NOTE — Progress Notes (Signed)
  Subjective:     Patient ID: Sarah Coleman, female   DOB: Apr 06, 1951, 69 y.o.   MRN: [id number]  HPI Khrystyna is a 69 year old black female, sp hysterectomy in complaining of having a cyst on right labia, it was itching but good now after taking antibiotic for UTI and has had some vaginal itching. PCP is Dr Moshe Cipro.  Review of Systems Right labial cyst  +vaginal itching Reviewed past medical,surgical, social and family history. Reviewed medications and allergies.     Objective:   Physical Exam BP 135/85 (BP Location: Left Arm, Patient Position: Sitting, Cuff Size: Normal)   Pulse 74   Ht 5\' 6"  (1.676 m)   Wt 222 lb (100.7 kg)   BMI 35.83 kg/m    Skin warm and dry.Pelvic: external genitalia is normal in appearance, has vitiligo,has 3.7 mm round nodule right labia, when squeezed, some white material expressed,  vagina: white,grainy discharge without odor,urethra has no lesions or masses noted, cervix and uterus are absent, adnexa: no masses or tenderness noted. Bladder is non tender and no masses felt. Wet prep: + yeast and few WBCs AA is 0 Fall risk is low Refused PHQ 2 Examination chaperoned by Celene Squibb LPN  Assessment:     1. Sebaceous cyst of labia Will watch for now , if starts back to itching, can remove  2. Vaginal itching Will rx diflucan   3. Vitiligo   4. Yeast infection Will rx diflucan,do not take Mevacor when takes diflucan  Meds ordered this encounter  Medications  . fluconazole (DIFLUCAN) 150 MG tablet    Sig: Take 1 now and 1 in 3 days if needed    Dispense:  2 tablet    Refill:  1    Order Specific Question:   Supervising Provider    Answer:   Florian Buff [[id number]]      Plan:     Follow up prn

## 2020-04-01 ENCOUNTER — Telehealth: Payer: PPO

## 2020-04-03 ENCOUNTER — Telehealth (INDEPENDENT_AMBULATORY_CARE_PROVIDER_SITE_OTHER): Payer: PPO

## 2020-04-03 ENCOUNTER — Other Ambulatory Visit: Payer: Self-pay

## 2020-04-03 VITALS — BP 143/80 | Ht 66.0 in | Wt 222.0 lb

## 2020-04-03 DIAGNOSIS — Z0000 Encounter for general adult medical examination without abnormal findings: Secondary | ICD-10-CM | POA: Diagnosis not present

## 2020-04-03 MED ORDER — FLUTICASONE PROPIONATE 50 MCG/ACT NA SUSP: NASAL | 5 refills | Status: DC

## 2020-04-03 NOTE — Progress Notes (Addendum)
Subjective:   Sarah Coleman is a 69 y.o. female who presents for Medicare Annual (Subsequent) preventive examination.  Review of Systems     Cardiac Risk Factors include: advanced age (>66men, >1 women);dyslipidemia;hypertension;obesity (BMI >30kg/m2);smoking/ tobacco exposure     Objective:    Today's Vitals   04/03/20 1047 04/03/20 1049  BP: (!) 143/80   Weight: 222 lb (100.7 kg)   Height: 5\' 6"  (1.676 m)   PainSc: 0-No pain 0-No pain   Body mass index is 35.83 kg/m.  Advanced Directives 04/03/2020 11/29/2018 01/16/2018 11/27/2017 04/11/2017 02/09/2017 02/08/2017  Does Patient Have a Medical Advance Directive? Yes Yes No Yes Yes Yes No  Type of Advance Directive - Living will - - [place];Living will [place];Living will -  Does patient want to make changes to medical advance directive? - - - No - Patient declined - - -  Copy of [place] in Chart? - - - - No - copy requested Yes -  Would patient like information on creating a medical advance directive? - - No - Patient declined - - (No Data) Yes (Inpatient - patient requests chaplain consult to create a medical advance directive)    Current Medications (verified) Outpatient Encounter Medications as of 04/03/2020  Medication Sig  . acetaminophen (TYLENOL) 500 MG tablet Take 1,000 mg by mouth 2 (two) times daily as needed for moderate pain or headache.  Marland Kitchen aspirin EC 81 MG tablet Take 81 mg by mouth at bedtime.   . Azelastine HCl 137 MCG/SPRAY SOLN USE 1 SPRAY(S) IN EACH NOSTRIL TWICE DAILY AS NEEDED  . Calcium Carbonate Antacid (EQ ANTACID PO) Take by mouth as needed.  Marland Kitchen CALCIUM-MAGNESIUM-ZINC PO Take 1 tablet by mouth 2 (two) times daily.   . carboxymethylcellulose (REFRESH PLUS) 0.5 % SOLN Place 1-2 drops into both eyes 3 (three) times daily as needed (for dry eyes).  . chlorpheniramine (CHLOR-TRIMETON) 4 MG tablet Take one tablet once daily as needed, for excessive  nasal drainage  . chlorthalidone (HYGROTON) 25 MG tablet TAKE 1 TABLET BY MOUTH ONCE DAILY *STOP MAXZIDE*  . estradiol (ESTRACE) 0.1 MG/GM vaginal cream USE 1 APPLICATORFUL VAGINALLY EVERY OTHER DAY AS NEEDED FOR  VAGINAL  DISCOMFORT  . famotidine (PEPCID) 40 MG tablet Take one tablet by mouth once daily , as needed, for heartburn  . fluticasone (FLONASE) 50 MCG/ACT nasal spray Use 2 spray(s) in each nostril once daily  . loratadine (CLARITIN) 10 MG tablet Take two tablets once daily for allergies  . lovastatin (MEVACOR) 40 MG tablet TAKE 1 TABLET BY MOUTH AT BEDTIME  . meclizine (ANTIVERT) 12.5 MG tablet Take 1 tablet (12.5 mg total) by mouth 3 (three) times daily as needed for dizziness.  . montelukast (SINGULAIR) 10 MG tablet TAKE 1 TABLET BY MOUTH AT BEDTIME  . Omega-3 300 MG CAPS Take 1,200 mg by mouth daily.  . potassium chloride SA (KLOR-CON) 20 MEQ tablet TAKE 2  BY MOUTH THREE TIMES DAILY  . spironolactone (ALDACTONE) 25 MG tablet Take 1/2 (one-half) tablet by mouth once daily  . [DISCONTINUED] fluconazole (DIFLUCAN) 150 MG tablet Take 1 now and 1 in 3 days if needed  . [DISCONTINUED] fluticasone (FLONASE) 50 MCG/ACT nasal spray Use 2 spray(s) in each nostril once daily   No facility-administered encounter medications on file as of 04/03/2020.    Allergies (verified) Cinnamon, Ibuprofen, Penicillins, and Sweet potato   History: Past Medical History:  Diagnosis Date  . Allergic  rhinitis   . Allergy   . Anemia   . Arthritis   . GERD (gastroesophageal reflux disease) 2013  . Hyperlipidemia   . Hypertension   . Metabolic syndrome X 1740  . Morbid obesity ([place]) 2000  . Obesity   . Prediabetes 2010  . Seizures ([place])    as child; unknown etiology.   Past Surgical History:  Procedure Laterality Date  . BIOPSY  04/11/2017   Procedure: BIOPSY;  Surgeon: Danie Binder, MD;  [facility];  Service: Endoscopy;;  gastric bx's  . COLONOSCOPY N/A 10/07/2016   Procedure:  COLONOSCOPY;  Surgeon: Danie Binder, MD;  [facility];  Service: Endoscopy;  Laterality: N/A;  9:30 AM  . COLONOSCOPY WITH PROPOFOL N/A 11/01/2016   Procedure: COLONOSCOPY WITH PROPOFOL;  Surgeon: Danie Binder, MD;  [facility];  Service: Endoscopy;  Laterality: N/A;  830   . COLONOSCOPY WITH PROPOFOL N/A 01/23/2018   Procedure: COLONOSCOPY WITH PROPOFOL;  Surgeon: Danie Binder, MD;  [facility];  Service: Endoscopy;  Laterality: N/A;  2:00pm - pt knows to arrive at 10:15  . ESOPHAGOGASTRODUODENOSCOPY (EGD) WITH PROPOFOL N/A 04/11/2017   Procedure: ESOPHAGOGASTRODUODENOSCOPY (EGD) WITH PROPOFOL;  Surgeon: Danie Binder, MD;  [facility];  Service: Endoscopy;  Laterality: N/A;  10:45am  . GERD    . PARTIAL HYSTERECTOMY  1987   secondary to ovarian cyst   . POLYPECTOMY  11/01/2016   Procedure: POLYPECTOMY;  Surgeon: Danie Binder, MD;  [facility];  Service: Endoscopy;;  colon  . POLYPECTOMY  01/23/2018   Procedure: POLYPECTOMY;  Surgeon: Danie Binder, MD;  [facility];  Service: Endoscopy;;  ascending colon  . SAVORY DILATION N/A 04/11/2017   Procedure: SAVORY DILATION;  Surgeon: Danie Binder, MD;  [facility];  Service: Endoscopy;  Laterality: N/A;  . TUBAL LIGATION  1975   Family History  Problem Relation Age of Onset  . Heart attack Mother   . Heart disease Mother 55       massive heart attack  . Prostate cancer Father   . Colon cancer Father 43  . Hypertension Sister   . Ovarian cancer Sister   . Cancer Brother 58       oral   . Hypertension Brother   . Heart disease Brother    Social History   Socioeconomic History  . Marital status: Married    Spouse name: Not on file  . Number of children: 5  . Years of education: Not on file  . Highest education level: Not on file  Occupational History  . Occupation: retired   Tobacco Use  . Smoking status: Former Smoker    Packs/day: 1.00     Years: 3.00    Pack years: 3.00    Quit date: 08/15/1974    Years since quitting: 45.6  . Smokeless tobacco: Never Used  Vaping Use  . Vaping Use: Never used  Substance and Sexual Activity  . Alcohol use: No    Alcohol/week: 0.0 standard drinks  . Drug use: No  . Sexual activity: Yes    Birth control/protection: Surgical    Comment: hyst  Other Topics Concern  . Not on file  Social History Narrative  . Not on file   Social Determinants of Health   Financial Resource Strain: Low Risk   . Difficulty of Paying Living Expenses: Not hard at all  Food  Insecurity: No Food Insecurity  . Worried About Charity fundraiser in the Last Year: Never true  . Ran Out of Food in the Last Year: Never true  Transportation Needs: No Transportation Needs  . Lack of Transportation (Medical): No  . Lack of Transportation (Non-Medical): No  Physical Activity: Inactive  . Days of Exercise per Week: 0 days  . Minutes of Exercise per Session: 0 min  Stress: No Stress Concern Present  . Feeling of Stress : Not at all  Social Connections: Moderately Integrated  . Frequency of Communication with Friends and Family: More than three times a week  . Frequency of Social Gatherings with Friends and Family: More than three times a week  . Attends Religious Services: More than 4 times per year  . Active Member of Clubs or Organizations: No  . Attends Archivist Meetings: Never  . Marital Status: Married    Tobacco Counseling Counseling given: Not Answered   Clinical Intake:  Pre-visit preparation completed: Yes  Pain : No/denies pain Pain Score: 0-No pain     Nutritional Status: BMI > 30  Obese Diabetes: No  How often do you need to have someone help you when you read instructions, pamphlets, or other written materials from your doctor or pharmacy?: 1 - Never What is the last grade level you completed in school?: 10th grade  Diabetic? No (prediabetic)   Interpreter Needed?:  No  Information entered by :: [email or web address]   Activities of Daily Living In your present state of health, do you have any difficulty performing the following activities: 04/03/2020  Hearing? N  Vision? N  Difficulty concentrating or making decisions? N  Walking or climbing stairs? N  Dressing or bathing? N  Doing errands, shopping? N  Preparing Food and eating ? N  Using the Toilet? N  In the past six months, have you accidently leaked urine? N  Do you have problems with loss of bowel control? N  Managing your Medications? N  Managing your Finances? N  Housekeeping or managing your Housekeeping? N  Some recent data might be hidden    Patient Care Team: Fayrene Helper, MD as PCP - General Franchot Gallo, MD as Consulting Physician (Urology) Harl Bowie, Alphonse Guild, MD as Consulting Physician (Cardiology) Danie Binder, MD (Inactive) as Consulting Physician (Gastroenterology)  Indicate any recent Medical Services you may have received from other than [facility] providers in the past year (date may be approximate).     Assessment:   This is a routine wellness examination for [place].  Hearing/Vision screen No exam data present  Dietary issues and exercise activities discussed: Current Exercise Habits: Home exercise routine (walks, uses treadmill, stretches), Type of exercise: walking;stretching;strength training/weights, Time (Minutes): 35, Frequency (Times/Week): 4, Weekly Exercise (Minutes/Week): 140, Intensity: Moderate  Goals    . Blood Pressure < 140/90     Goal less than 140/90    . Exercise 3x per week (30 min per time)     Recommend starting an exercise program 3 times a week at least 30 minutes at a time.    . Increase water intake      Depression Screen PHQ 2/9 Scores 04/03/2020 03/30/2020 03/26/2020 03/10/2020 01/29/2019 11/29/2018 09/26/2018  PHQ - 2 Score 0 - 0 0 0 0 0  PHQ- 9 Score - - - - - - 1  Exception Documentation - Patient refusal - - - - -    Fall  Risk Fall Risk  04/03/2020 03/30/2020 03/26/2020 03/10/2020  08/22/2019  Falls in the past year? 0 0 0 0 1  Number falls in past yr: 0 0 - - 0  Injury with Fall? 0 0 - - 0  Follow up - Falls evaluation completed - - -    Any stairs in or around the home? Yes  If so, are there any without handrails? No  Home free of loose throw rugs in walkways, pet beds, electrical cords, etc? Yes  Adequate lighting in your home to reduce risk of falls? Yes   ASSISTIVE DEVICES UTILIZED TO PREVENT FALLS:  Life alert? No  Use of a cane, walker or w/c? No  Grab bars in the bathroom? No  Shower chair or bench in shower? No  Elevated toilet seat or a handicapped toilet? No   TIMED UP AND GO:  Was the test performed? No .  Length of time to ambulate 10 feet:  sec.     Cognitive Function:     6CIT Screen 04/03/2020 11/29/2018 11/27/2017 09/21/2016  What Year? 0 points 0 points 0 points 0 points  What month? 0 points 0 points 0 points 0 points  What time? 0 points 0 points 0 points 0 points  Count back from 20 0 points 0 points 0 points 0 points  Months in reverse 0 points 0 points 0 points 0 points  Repeat phrase 0 points 2 points 2 points 0 points  Total Score 0 2 2 0    Immunizations Immunization History  Administered Date(s) Administered  . Fluad Quad(high Dose 65+) 04/15/2019  . Influenza Whole 05/08/2007  . Influenza,inj,Quad PF,6+ Mos 04/23/2013, 07/28/2014, 05/14/2015, 04/28/2016, 05/09/2017, 05/31/2018  . Moderna SARS-COVID-2 Vaccination 10/24/2019, 11/26/2019  . Pneumococcal Conjugate-13 03/26/2014  . Pneumococcal Polysaccharide-23 02/09/2016  . Td 05/09/2006  . Zoster 11/01/2012    TDAP status: Due, Education has been provided regarding the importance of this vaccine. Advised may receive this vaccine at local pharmacy or Health Dept. Aware to provide a copy of the vaccination record if obtained from local pharmacy or Health Dept. Verbalized acceptance and understanding. Flu Vaccine  status: Up to date Pneumococcal vaccine status: Up to date Covid-19 vaccine status: Completed vaccines  Qualifies for Shingles Vaccine? Yes   Zostavax completed Yes   Shingrix Completed?: No.    Education has been provided regarding the importance of this vaccine. Patient has been advised to call insurance company to determine out of pocket expense if they have not yet received this vaccine. Advised may also receive vaccine at local pharmacy or Health Dept. Verbalized acceptance and understanding.  Screening Tests Health Maintenance  Topic Date Due  . INFLUENZA VACCINE  03/15/2020  . TETANUS/TDAP  08/21/2020 (Originally 05/09/2016)  . MAMMOGRAM  02/06/2021  . COLONOSCOPY  01/24/2028  . DEXA SCAN  Completed  . COVID-19 Vaccine  Completed  . Hepatitis C Screening  Completed  . PNA vac Low Risk Adult  Completed    Health Maintenance  Health Maintenance Due  Topic Date Due  . INFLUENZA VACCINE  03/15/2020    Colorectal cancer screening: Completed 2019. Repeat every 3 years Mammogram status: Ordered yes. Pt provided with contact info and advised to call to schedule appt.  Bone Density status: Ordered yes. Pt provided with contact info and advised to call to schedule appt.  Lung Cancer Screening: (Low Dose CT Chest recommended if Age 52-80 years, 30 pack-year currently smoking OR have quit w/in 15years.) does not qualify.   Lung Cancer Screening Referral: no   Additional Screening:  Hepatitis C Screening: does qualify; Completed yes  Vision Screening: Recommended annual ophthalmology exams for early detection of glaucoma and other disorders of the eye. Is the patient up to date with their annual eye exam?  Yes  Who is the provider or what is the name of the office in which the patient attends annual eye exams? [email or web address] If pt is not established with a provider, would they like to be referred to a provider to establish care? No .   Dental Screening: Recommended annual dental exams  for proper oral hygiene  Community Resource Referral / Chronic Care Management: CRR required this visit?  No   CCM required this visit?  No      Plan:     I have personally reviewed and noted the following in the patient's chart:   . Medical and social history . Use of alcohol, tobacco or illicit drugs  . Current medications and supplements . Functional ability and status . Nutritional status . Physical activity . Advanced directives . List of other physicians . Hospitalizations, surgeries, and ER visits in previous 12 months . Vitals . Screenings to include cognitive, depression, and falls . Referrals and appointments  In addition, I have reviewed and discussed with patient certain preventive protocols, quality metrics, and best practice recommendations. A written personalized care plan for preventive services as well as general preventive health recommendations were provided to patient.     Kate Sable, LPN, LPN   [id number]/84/6962   Nurse Notes: Visit completed by telephone. Patient at home, provider in the office, time spent with pt 25 mins

## 2020-04-03 NOTE — Patient Instructions (Signed)
Sarah Coleman , Thank you for taking time to come for your Medicare Wellness Visit. I appreciate your ongoing commitment to your health goals. Please review the following plan we discussed and let me know if I can assist you in the future.   Screening recommendations/referrals: Colonoscopy: due for recheck in 2022 Mammogram: scheduled Bone Density: scheduled Recommended yearly ophthalmology/optometry visit for glaucoma screening and checkup Recommended yearly dental visit for hygiene and checkup  Vaccinations: Influenza vaccine: coming on 04/06/20 Pneumococcal vaccine: up to date Tdap vaccine: not covered as a preventative vaccine Shingles vaccine: info mailed- can get at pharmacy  Advanced directives: forms on file     Next appointment: wellness in 1 year   Preventive Care 25 Years and Older, Female Preventive care refers to lifestyle choices and visits with your health care provider that can promote health and wellness. What does preventive care include?  A yearly physical exam. This is also called an annual well check.  Dental exams once or twice a year.  Routine eye exams. Ask your health care provider how often you should have your eyes checked.  Personal lifestyle choices, including:  Daily care of your teeth and gums.  Regular physical activity.  Eating a healthy diet.  Avoiding tobacco and drug use.  Limiting alcohol use.  Practicing safe sex.  Taking low-dose aspirin every day.  Taking vitamin and mineral supplements as recommended by your health care provider. What happens during an annual well check? The services and screenings done by your health care provider during your annual well check will depend on your age, overall health, lifestyle risk factors, and family history of disease. Counseling  Your health care provider may ask you questions about your:  Alcohol use.  Tobacco use.  Drug use.  Emotional well-being.  Home and relationship  well-being.  Sexual activity.  Eating habits.  History of falls.  Memory and ability to understand (cognition).  Work and work Statistician.  Reproductive health. Screening  You may have the following tests or measurements:  Height, weight, and BMI.  Blood pressure.  Lipid and cholesterol levels. These may be checked every 5 years, or more frequently if you are over 69 years old.  Skin check.  Lung cancer screening. You may have this screening every year starting at age 25 if you have a 30-pack-year history of smoking and currently smoke or have quit within the past 15 years.  Fecal occult blood test (FOBT) of the stool. You may have this test every year starting at age 19.  Flexible sigmoidoscopy or colonoscopy. You may have a sigmoidoscopy every 5 years or a colonoscopy every 10 years starting at age 53.  Hepatitis C blood test.  Hepatitis B blood test.  Sexually transmitted disease (STD) testing.  Diabetes screening. This is done by checking your blood sugar (glucose) after you have not eaten for a while (fasting). You may have this done every 1-3 years.  Bone density scan. This is done to screen for osteoporosis. You may have this done starting at age 23.  Mammogram. This may be done every 1-2 years. Talk to your health care provider about how often you should have regular mammograms. Talk with your health care provider about your test results, treatment options, and if necessary, the need for more tests. Vaccines  Your health care provider may recommend certain vaccines, such as:  Influenza vaccine. This is recommended every year.  Tetanus, diphtheria, and acellular pertussis (Tdap, Td) vaccine. You may need a Td booster  every 10 years.  Zoster vaccine. You may need this after age 22.  Pneumococcal 13-valent conjugate (PCV13) vaccine. One dose is recommended after age 14.  Pneumococcal polysaccharide (PPSV23) vaccine. One dose is recommended after age  75. Talk to your health care provider about which screenings and vaccines you need and how often you need them. This information is not intended to replace advice given to you by your health care provider. Make sure you discuss any questions you have with your health care provider. Document Released: 08/28/2015 Document Revised: 04/20/2016 Document Reviewed: 06/02/2015 Elsevier Interactive Patient Education  2017 [place] Prevention in the Home Falls can cause injuries. They can happen to people of all ages. There are many things you can do to make your home safe and to help prevent falls. What can I do on the outside of my home?  Regularly fix the edges of walkways and driveways and fix any cracks.  Remove anything that might make you trip as you walk through a door, such as a raised step or threshold.  Trim any bushes or trees on the path to your home.  Use bright outdoor lighting.  Clear any walking paths of anything that might make someone trip, such as rocks or tools.  Regularly check to see if handrails are loose or broken. Make sure that both sides of any steps have handrails.  Any raised decks and porches should have guardrails on the edges.  Have any leaves, snow, or ice cleared regularly.  Use sand or salt on walking paths during winter.  Clean up any spills in your garage right away. This includes oil or grease spills. What can I do in the bathroom?  Use night lights.  Install grab bars by the toilet and in the tub and shower. Do not use towel bars as grab bars.  Use non-skid mats or decals in the tub or shower.  If you need to sit down in the shower, use a plastic, non-slip stool.  Keep the floor dry. Clean up any water that spills on the floor as soon as it happens.  Remove soap buildup in the tub or shower regularly.  Attach bath mats securely with double-sided non-slip rug tape.  Do not have throw rugs and other things on the floor that can make  you trip. What can I do in the bedroom?  Use night lights.  Make sure that you have a light by your bed that is easy to reach.  Do not use any sheets or blankets that are too big for your bed. They should not hang down onto the floor.  Have a firm chair that has side arms. You can use this for support while you get dressed.  Do not have throw rugs and other things on the floor that can make you trip. What can I do in the kitchen?  Clean up any spills right away.  Avoid walking on wet floors.  Keep items that you use a lot in easy-to-reach places.  If you need to reach something above you, use a strong step stool that has a grab bar.  Keep electrical cords out of the way.  Do not use floor polish or wax that makes floors slippery. If you must use wax, use non-skid floor wax.  Do not have throw rugs and other things on the floor that can make you trip. What can I do with my stairs?  Do not leave any items on the stairs.  Make  sure that there are handrails on both sides of the stairs and use them. Fix handrails that are broken or loose. Make sure that handrails are as long as the stairways.  Check any carpeting to make sure that it is firmly attached to the stairs. Fix any carpet that is loose or worn.  Avoid having throw rugs at the top or bottom of the stairs. If you do have throw rugs, attach them to the floor with carpet tape.  Make sure that you have a light switch at the top of the stairs and the bottom of the stairs. If you do not have them, ask someone to add them for you. What else can I do to help prevent falls?  Wear shoes that:  Do not have high heels.  Have rubber bottoms.  Are comfortable and fit you well.  Are closed at the toe. Do not wear sandals.  If you use a stepladder:  Make sure that it is fully opened. Do not climb a closed stepladder.  Make sure that both sides of the stepladder are locked into place.  Ask someone to hold it for you, if  possible.  Clearly mark and make sure that you can see:  Any grab bars or handrails.  First and last steps.  Where the edge of each step is.  Use tools that help you move around (mobility aids) if they are needed. These include:  Canes.  Walkers.  Scooters.  Crutches.  Turn on the lights when you go into a dark area. Replace any light bulbs as soon as they burn out.  Set up your furniture so you have a clear path. Avoid moving your furniture around.  If any of your floors are uneven, fix them.  If there are any pets around you, be aware of where they are.  Review your medicines with your doctor. Some medicines can make you feel dizzy. This can increase your chance of falling. Ask your doctor what other things that you can do to help prevent falls. This information is not intended to replace advice given to you by your health care provider. Make sure you discuss any questions you have with your health care provider. Document Released: 05/28/2009 Document Revised: 01/07/2016 Document Reviewed: 09/05/2014 Elsevier Interactive Patient Education  2017 [employer].

## 2020-04-07 ENCOUNTER — Other Ambulatory Visit: Payer: Self-pay | Admitting: Family Medicine

## 2020-04-13 ENCOUNTER — Ambulatory Visit (HOSPITAL_COMMUNITY)
Admission: RE | Admit: 2020-04-13 | Discharge: 2020-04-13 | Disposition: A | Discharge status: Home / Self Care | Payer: PPO | Source: Ambulatory Visit | Attending: Family Medicine | Admitting: Family Medicine

## 2020-04-13 ENCOUNTER — Other Ambulatory Visit: Payer: Self-pay

## 2020-04-13 DIAGNOSIS — Z1231 Encounter for screening mammogram for malignant neoplasm of breast: Secondary | ICD-10-CM | POA: Diagnosis not present

## 2020-04-13 DIAGNOSIS — M8588 Other specified disorders of bone density and structure, other site: Secondary | ICD-10-CM | POA: Diagnosis not present

## 2020-04-13 DIAGNOSIS — R29890 Loss of height: Secondary | ICD-10-CM | POA: Diagnosis not present

## 2020-04-13 DIAGNOSIS — Z780 Asymptomatic menopausal state: Secondary | ICD-10-CM | POA: Insufficient documentation

## 2020-04-15 ENCOUNTER — Telehealth: Payer: Self-pay

## 2020-04-15 ENCOUNTER — Ambulatory Visit (INDEPENDENT_AMBULATORY_CARE_PROVIDER_SITE_OTHER): Payer: PPO | Admitting: Family Medicine

## 2020-04-15 ENCOUNTER — Other Ambulatory Visit: Payer: Self-pay

## 2020-04-15 ENCOUNTER — Encounter: Payer: Self-pay | Admitting: Family Medicine

## 2020-04-15 VITALS — BP 129/84 | HR 83 | Resp 16 | Ht 67.0 in | Wt 219.1 lb

## 2020-04-15 DIAGNOSIS — Z23 Encounter for immunization: Secondary | ICD-10-CM

## 2020-04-15 DIAGNOSIS — I10 Essential (primary) hypertension: Secondary | ICD-10-CM

## 2020-04-15 DIAGNOSIS — R309 Painful micturition, unspecified: Secondary | ICD-10-CM | POA: Diagnosis not present

## 2020-04-15 LAB — POCT URINALYSIS DIP (CLINITEK)
Bilirubin, UA: NEGATIVE
Glucose, UA: NEGATIVE mg/dL
Ketones, POC UA: NEGATIVE mg/dL
Leukocytes, UA: NEGATIVE
Nitrite, UA: NEGATIVE
POC PROTEIN,UA: NEGATIVE
Spec Grav, UA: 1.02 (ref 1.010–1.025)
Urobilinogen, UA: 0.2 E.U./dL
pH, UA: 6 (ref 5.0–8.0)

## 2020-04-15 MED ORDER — NITROFURANTOIN MONOHYD MACRO 100 MG PO CAPS: 100.0000 mg | ORAL_CAPSULE | Freq: Two times a day (BID) | ORAL | 0 refills | Status: DC

## 2020-04-15 MED ORDER — FLUCONAZOLE 150 MG PO TABS: 150.0000 mg | ORAL_TABLET | Freq: Once | ORAL | 0 refills | Status: AC

## 2020-04-15 NOTE — Progress Notes (Signed)
   Sarah Coleman     MRN: [id number]      DOB: 09-Apr-1951   HPI Sarah Coleman is here with a 3 day h/o painful urination , not sexually active, denies fever, chills , flank pain or nausea   ROS Denies recent fever or chills. Denies sinus pressure, nasal congestion, ear pain or sore throat. Denies chest congestion, productive cough or wheezing. Denies chest pains, palpitations and leg swelling Denies abdominal pain, nausea, vomiting,diarrhea or constipation.    Denies headaches, seizures, numbness, or tingling. Denies depression, anxiety or insomnia. Denies skin break down or rash.   PE  BP 129/84   Pulse 83   Resp 16   Ht 5\' 7"  (1.702 m)   Wt 219 lb 1.3 oz (99.4 kg)   SpO2 96%   BMI 34.31 kg/m   Patient alert and oriented and in no cardiopulmonary distress.  HEENT: No facial asymmetry, EOMI,     Neck supple .  Chest: Clear to auscultation bilaterally.  CVS: S1, S2 no murmurs, no S3.Regular rate.  ABD: Soft non tender. No renal angle or suprapubic tenderness Ext: No edema  MS: Adequate ROM spine, shoulders, hips and knees.  Skin: Intact, no ulcerations or rash noted.  Psych: Good eye contact, normal affect. Memory intact not anxious or depressed appearing.  CNS: CN 2-12 intact, power,  normal throughout.no focal deficits noted.   Assessment & Plan  Painful urination Symptomatic with trace RBC in office specimen, send to lab for furhter testing. No RBC in lab specimen and no infection, refer to urology for evaluation  Essential hypertension Controlled, no change in medication DASH diet and commitment to daily physical activity for a minimum of 30 minutes discussed and encouraged, as a part of hypertension management. The importance of attaining a healthy weight is also discussed.  BP/Weight 04/15/2020 04/03/2020 03/30/2020 03/26/2020 03/10/2020 08/22/2019 63/84/6659  Systolic BP 935 701 779 390 300 923 300  Diastolic BP 84 80 85 75 85 80 84  Wt. (Lbs) 219.08 222 222  222 222 226 225.4  BMI 34.31 35.83 35.83 35.83 35.83 36.48 36.38       Morbid obesity (HCC)  Patient re-educated about  the importance of commitment to a  minimum of 150 minutes of exercise per week as able.  The importance of healthy food choices with portion control discussed, as well as eating regularly and within a 12 hour window most days. The need to choose "clean , green" food 50 to 75% of the time is discussed, as well as to make water the primary drink and set a goal of 64 ounces water daily.    Weight /BMI 04/15/2020 04/03/2020 03/30/2020  WEIGHT 219 lb 1.3 oz 222 lb 222 lb  HEIGHT 5\' 7"  5\' 6"  5\' 6"   BMI 34.31 kg/m2 35.83 kg/m2 35.83 kg/m2

## 2020-04-15 NOTE — Telephone Encounter (Signed)
SCHEDULE PATIENT AT 2:20 TODAY AND CALLED PATIENT TO NOTIFY

## 2020-04-15 NOTE — Patient Instructions (Addendum)
F/u in December as before, call if you need me sooner  Flu vaccine today  You are treated for presumed bladder infection, 3 days of antibiotic followed by one fluconazole tablet for yeast is prescribed. Do not take the cholesterol medication on the day you take  fluconazole  Urine will be sent to the lab for urinalysis and culture  We will be in touch next week with results

## 2020-04-15 NOTE — Telephone Encounter (Signed)
Patient states she has a UTI. Wanting to know if she needs an appointment or what.

## 2020-04-15 NOTE — Telephone Encounter (Signed)
Will you please schedule her a appointment. The 2:20 for today is not coming in. She can have that appointment.

## 2020-04-16 ENCOUNTER — Other Ambulatory Visit (HOSPITAL_COMMUNITY)
Admission: RE | Admit: 2020-04-16 | Discharge: 2020-04-16 | Disposition: A | Discharge status: Home / Self Care | Payer: PPO | Source: Other Acute Inpatient Hospital | Attending: Family Medicine | Admitting: Family Medicine

## 2020-04-16 DIAGNOSIS — R309 Painful micturition, unspecified: Secondary | ICD-10-CM | POA: Insufficient documentation

## 2020-04-16 LAB — URINALYSIS, ROUTINE W REFLEX MICROSCOPIC
Bilirubin Urine: NEGATIVE
Glucose, UA: NEGATIVE mg/dL
Hgb urine dipstick: NEGATIVE
Ketones, ur: NEGATIVE mg/dL
Leukocytes,Ua: NEGATIVE
Nitrite: NEGATIVE
Protein, ur: NEGATIVE mg/dL
Specific Gravity, Urine: 1.009 (ref 1.005–1.030)
pH: 5 (ref 5.0–8.0)

## 2020-04-18 LAB — URINE CULTURE: Culture: <10000 — AB

## 2020-04-20 ENCOUNTER — Encounter: Payer: Self-pay | Admitting: Family Medicine

## 2020-04-20 DIAGNOSIS — R309 Painful micturition, unspecified: Secondary | ICD-10-CM | POA: Insufficient documentation

## 2020-04-20 NOTE — Assessment & Plan Note (Signed)
Symptomatic with trace RBC in office specimen, send to lab for furhter testing. No RBC in lab specimen and no infection, refer to urology for evaluation

## 2020-04-20 NOTE — Assessment & Plan Note (Signed)
  Patient re-educated about  the importance of commitment to a  minimum of 150 minutes of exercise per week as able.  The importance of healthy food choices with portion control discussed, as well as eating regularly and within a 12 hour window most days. The need to choose "clean , green" food 50 to 75% of the time is discussed, as well as to make water the primary drink and set a goal of 64 ounces water daily.    Weight /BMI 04/15/2020 04/03/2020 03/30/2020  WEIGHT 219 lb 1.3 oz 222 lb 222 lb  HEIGHT 5\' 7"  5\' 6"  5\' 6"   BMI 34.31 kg/m2 35.83 kg/m2 35.83 kg/m2

## 2020-04-20 NOTE — Assessment & Plan Note (Signed)
Controlled, no change in medication DASH diet and commitment to daily physical activity for a minimum of 30 minutes discussed and encouraged, as a part of hypertension management. The importance of attaining a healthy weight is also discussed.  BP/Weight 04/15/2020 04/03/2020 03/30/2020 03/26/2020 03/10/2020 08/22/2019 23/41/4436  Systolic BP 016 580 063 494 944 739 584  Diastolic BP 84 80 85 75 85 80 84  Wt. (Lbs) 219.08 222 222 222 222 226 225.4  BMI 34.31 35.83 35.83 35.83 35.83 36.48 36.38

## 2020-04-24 ENCOUNTER — Other Ambulatory Visit: Payer: Self-pay | Admitting: Family Medicine

## 2020-04-29 ENCOUNTER — Telehealth: Payer: Self-pay

## 2020-04-29 NOTE — Telephone Encounter (Signed)
Ok to refill diflucan? Took antibiotic on 9/1

## 2020-04-29 NOTE — Telephone Encounter (Signed)
Patient called wanting prescription for Difluca

## 2020-04-30 ENCOUNTER — Other Ambulatory Visit: Payer: Self-pay | Admitting: Cardiology

## 2020-04-30 ENCOUNTER — Other Ambulatory Visit: Payer: Self-pay

## 2020-04-30 MED ORDER — FLUCONAZOLE 150 MG PO TABS: 150.0000 mg | ORAL_TABLET | Freq: Once | ORAL | 0 refills | Status: AC

## 2020-04-30 NOTE — Telephone Encounter (Signed)
Pls refill x 1 only, sorry for delay in response , pls let her know

## 2020-04-30 NOTE — Telephone Encounter (Signed)
Pt called saying she was referred by Dr. Moshe Cipro for recurrent uti and has return appt but is burning and in pain when urinating. Wanted to know if we could recommend anything for sx. All ready taking AZO. She hasn't been seen here in a yr. I explained AZO otc best to take. Told her DR. Dahlstedt couldn't send anything in w/o seeing her. I advised her to call PCP. Pt expressed understanding.

## 2020-05-07 ENCOUNTER — Telehealth: Payer: Self-pay

## 2020-05-07 NOTE — Telephone Encounter (Signed)
Patient left voicemail that she couldn't see urology until late oct and the pain was so bad sometimes she was in tears and she needed something to help when it got that bad until she could be seen. Please advise  (message had been listened to and saved on voicemail from 9/16) I am just now hearing it. I called patient and let her know

## 2020-05-07 NOTE — Telephone Encounter (Signed)
I tried calling to speak to her phone is busy x 2 I recommend oTC cranberry tablets one daily and also buy pyridium also oTC, turns urine orange , when she has flare of painful urination use the pyridium for 2 to 3 days.Also OTC ibuprofen 200 mg one tablet once daily for 3 days will help the painful urination If she has fever, chills, flank pain excess frequency needs to call for appointment to be tested  for uTI, please call and explain all of this to her. SORRY re delay in Response as you already explained to her

## 2020-05-07 NOTE — Telephone Encounter (Signed)
Tried to call back, phone still busy

## 2020-05-07 NOTE — Telephone Encounter (Signed)
Patient cannot see urology until mid Oct and she is in

## 2020-05-08 NOTE — Telephone Encounter (Signed)
Pt aware.

## 2020-06-06 ENCOUNTER — Other Ambulatory Visit: Payer: Self-pay | Admitting: Family Medicine

## 2020-06-09 ENCOUNTER — Other Ambulatory Visit: Payer: Self-pay

## 2020-06-09 ENCOUNTER — Encounter: Payer: Self-pay | Admitting: Urology

## 2020-06-09 ENCOUNTER — Other Ambulatory Visit: Payer: Self-pay | Admitting: Urology

## 2020-06-09 ENCOUNTER — Ambulatory Visit (INDEPENDENT_AMBULATORY_CARE_PROVIDER_SITE_OTHER): Payer: PPO | Admitting: Urology

## 2020-06-09 VITALS — BP 135/82 | HR 77 | Temp 98.7°F | Ht 67.0 in | Wt 219.0 lb

## 2020-06-09 DIAGNOSIS — E785 Hyperlipidemia, unspecified: Secondary | ICD-10-CM

## 2020-06-09 DIAGNOSIS — R300 Dysuria: Secondary | ICD-10-CM

## 2020-06-09 DIAGNOSIS — N952 Postmenopausal atrophic vaginitis: Secondary | ICD-10-CM

## 2020-06-09 DIAGNOSIS — R3915 Urgency of urination: Secondary | ICD-10-CM | POA: Diagnosis not present

## 2020-06-09 LAB — URINALYSIS, ROUTINE W REFLEX MICROSCOPIC
Bilirubin, UA: NEGATIVE
Glucose, UA: NEGATIVE
Ketones, UA: NEGATIVE
Leukocytes,UA: NEGATIVE
Nitrite, UA: NEGATIVE
Protein,UA: NEGATIVE
RBC, UA: NEGATIVE
Specific Gravity, UA: 1.02 (ref 1.005–1.030)
Urobilinogen, Ur: 0.2 mg/dL (ref 0.2–1.0)
pH, UA: 7.5 (ref 5.0–7.5)

## 2020-06-09 MED ORDER — ESTRADIOL 0.1 MG/GM VA CREA: TOPICAL_CREAM | VAGINAL | 3 refills | Status: DC

## 2020-06-09 NOTE — Progress Notes (Signed)
History of Present Illness: Sarah Coleman is a 69 y.o. year old female with-year-old female referred by Dr. Tula Nakayama for dysuria.  I last saw her in January 2020.  At that time she was being treated for atrophic vaginal changes.  She is still on estrogen cream 2 nights a week.  She does have urinary frequency and urgency.  She has fairly constant bladder discomfort.  She has a good stream, feels like she empties well.  She has no significant gross hematuria.  She has fairly regular dysuria for which she takes Pyridium.  She has no leakage.  Past Medical History:  Diagnosis Date  . Allergic rhinitis   . Allergy   . Anemia   . Arthritis   . GERD (gastroesophageal reflux disease) 2013  . Hyperlipidemia   . Hypertension   . Metabolic syndrome X 2094  . Morbid obesity ([place]) 2000  . Obesity   . Prediabetes 2010  . Seizures ([place])    as child; unknown etiology.    Past Surgical History:  Procedure Laterality Date  . BIOPSY  04/11/2017   Procedure: BIOPSY;  Surgeon: Danie Binder, MD;  [facility];  Service: Endoscopy;;  gastric bx's  . COLONOSCOPY N/A 10/07/2016   Procedure: COLONOSCOPY;  Surgeon: Danie Binder, MD;  [facility]: [facility];  Service: Endoscopy;  Laterality: N/A;  9:30 AM  . COLONOSCOPY WITH PROPOFOL N/A 11/01/2016   Procedure: COLONOSCOPY WITH PROPOFOL;  Surgeon: Danie Binder, MD;  Location: [facility];  Service: Endoscopy;  Laterality: N/A;  830   . COLONOSCOPY WITH PROPOFOL N/A 01/23/2018   Procedure: COLONOSCOPY WITH PROPOFOL;  Surgeon: Danie Binder, MD;  [facility]: [facility];  Service: Endoscopy;  Laterality: N/A;  2:00pm - pt knows to arrive at 10:15  . ESOPHAGOGASTRODUODENOSCOPY (EGD) WITH PROPOFOL N/A 04/11/2017   Procedure: ESOPHAGOGASTRODUODENOSCOPY (EGD) WITH PROPOFOL;  Surgeon: Danie Binder, MD;  [facility]: [facility];  Service: Endoscopy;  Laterality: N/A;  10:45am  . GERD    . PARTIAL HYSTERECTOMY  1987   secondary to  ovarian cyst   . POLYPECTOMY  11/01/2016   Procedure: POLYPECTOMY;  Surgeon: Danie Binder, MD;  [facility];  Service: Endoscopy;;  colon  . POLYPECTOMY  01/23/2018   Procedure: POLYPECTOMY;  Surgeon: Danie Binder, MD;  [facility];  Service: Endoscopy;;  ascending colon  . SAVORY DILATION N/A 04/11/2017   Procedure: SAVORY DILATION;  Surgeon: Danie Binder, MD;  [facility]: [facility];  Service: Endoscopy;  Laterality: N/A;  . TUBAL LIGATION  1975    Home Medications:  (Not in a hospital admission)   Allergies:  Allergies  Allergen Reactions  . Cinnamon Swelling  . Ibuprofen Swelling  . Penicillins Nausea And Vomiting    Has patient had a PCN reaction causing immediate rash, facial/tongue/throat swelling, SOB or lightheadedness with hypotension:No Has patient had a PCN reaction causing severe rash involving mucus membranes or skin necrosis:No Has patient had a PCN reaction that required hospitalization:No Has patient had a PCN reaction occurring within the last 10 years:No If all of the above answers are "NO", then may proceed with Cephalosporin use.   Marland Kitchen Sweet Potato Swelling    Family History  Problem Relation Age of Onset  . Heart attack Mother   . Heart disease Mother 22       massive heart attack  . Prostate cancer Father   . Colon cancer Father  67  . Hypertension Sister   . Ovarian cancer Sister   . Cancer Brother 87       oral   . Hypertension Brother   . Heart disease Brother     Social History:  reports that she quit smoking about 45 years ago. She has a 3.00 pack-year smoking history. She has never used smokeless tobacco. She reports that she does not drink alcohol and does not use drugs.  ROS: A complete review of systems was performed.  All systems are negative except for pertinent findings as noted.  Physical Exam:  Vital signs in last 24 hours: @VSRANGES @ General:  Alert and oriented, No acute distress HEENT:  Normocephalic, atraumatic Neck: No JVD or lymphadenopathy Cardiovascular: Regular rate  Lungs: Normal inspiratory/expiratory excursion Extremities: No edema Neurologic: Grossly intact  I have reviewed prior pt notes  I have reviewed notes from referring/previous physicians  I have reviewed urinalysis results  I have reviewed prior urine culture Impression/Assessment:  1.  Atrophic vaginal changes, on Estrace cream  2.  Dysuria/bladder discomfort-may be a form of overactive bladder    Plan:  1.  I will give her samples of Myrbetriq 50 mg to take every other day.  I did let her know that she should check her blood pressure at home  2.  Continue estrogen cream as directed  3.  I will have her come back in about 6 weeks to recheck her symptoms.  [name] Jayko Voorhees 06/09/2020, 8:27 AM  [name]. Navy Belay MD

## 2020-06-12 ENCOUNTER — Telehealth: Payer: Self-pay

## 2020-06-15 ENCOUNTER — Telehealth: Payer: Self-pay

## 2020-06-15 NOTE — Telephone Encounter (Signed)
Called pt. Left message. Samples left at front desk

## 2020-06-15 NOTE — Telephone Encounter (Signed)
Please give her sample of gemtesa 75. 4 boxes

## 2020-06-15 NOTE — Telephone Encounter (Signed)
Message left for pt. Samples of Gemtiza left at front desk for pt to try per Dr. Alyson Ingles

## 2020-06-19 ENCOUNTER — Other Ambulatory Visit: Payer: Self-pay | Admitting: Cardiology

## 2020-07-15 ENCOUNTER — Ambulatory Visit (INDEPENDENT_AMBULATORY_CARE_PROVIDER_SITE_OTHER): Payer: PPO | Admitting: Family Medicine

## 2020-07-15 ENCOUNTER — Other Ambulatory Visit: Payer: Self-pay

## 2020-07-15 ENCOUNTER — Encounter: Payer: Self-pay | Admitting: Family Medicine

## 2020-07-15 VITALS — BP 120/82 | HR 95 | Resp 16 | Ht 66.0 in | Wt 219.1 lb

## 2020-07-15 DIAGNOSIS — R7303 Prediabetes: Secondary | ICD-10-CM | POA: Diagnosis not present

## 2020-07-15 DIAGNOSIS — I10 Essential (primary) hypertension: Secondary | ICD-10-CM | POA: Diagnosis not present

## 2020-07-15 DIAGNOSIS — J302 Other seasonal allergic rhinitis: Secondary | ICD-10-CM | POA: Diagnosis not present

## 2020-07-15 DIAGNOSIS — E785 Hyperlipidemia, unspecified: Secondary | ICD-10-CM | POA: Diagnosis not present

## 2020-07-15 NOTE — Patient Instructions (Addendum)
F/u in office with MD in 5 months, call if you need me before  Blood pressure is excellent ,  No medication change  Please get fasting lipid, cmp and EGFr, hBA1C one week before next visit  It is important that you exercise regularly at least 30 minutes 5 times a week. If you develop chest pain, have severe difficulty breathing, or feel very tired, stop exercising immediately and seek medical attention  Think about what you will eat, plan ahead. Choose " clean, green, fresh or frozen" over canned, processed or packaged foods which are more sugary, salty and fatty. 70 to 75% of food eaten should be vegetables and fruit. Three meals at set times with snacks allowed between meals, but they must be fruit or vegetables. Aim to eat over a 12 hour period , example 7 am to 7 pm, and STOP after  your last meal of the day. Drink water,generally about 64 ounces per day, no other drink is as healthy. Fruit juice is best enjoyed in a healthy way, by EATING the fruit.   Thanks for choosing [facility], we consider it a privelige to serve you.

## 2020-07-19 ENCOUNTER — Encounter: Payer: Self-pay | Admitting: Family Medicine

## 2020-07-19 NOTE — Assessment & Plan Note (Signed)
Controlled, no change in medication DASH diet and commitment to daily physical activity for a minimum of 30 minutes discussed and encouraged, as a part of hypertension management. The importance of attaining a healthy weight is also discussed.  BP/Weight 07/15/2020 06/09/2020 04/15/2020 04/03/2020 03/30/2020 03/26/2020 04/29/412  Systolic BP 643 837 793 968 864 847 207  Diastolic BP 82 82 84 80 85 75 85  Wt. (Lbs) 219.08 219 219.08 222 222 222 222  BMI 35.36 34.3 34.31 35.83 35.83 35.83 35.83

## 2020-07-19 NOTE — Assessment & Plan Note (Signed)
  Patient re-educated about  the importance of commitment to a  minimum of 150 minutes of exercise per week as able.  The importance of healthy food choices with portion control discussed, as well as eating regularly and within a 12 hour window most days. The need to choose "clean , green" food 50 to 75% of the time is discussed, as well as to make water the primary drink and set a goal of 64 ounces water daily.    Weight /BMI 07/15/2020 06/09/2020 04/15/2020  WEIGHT 219 lb 1.3 oz 219 lb 219 lb 1.3 oz  HEIGHT 5\' 6"  5\' 7"  5\' 7"   BMI 35.36 kg/m2 34.3 kg/m2 34.31 kg/m2

## 2020-07-19 NOTE — Assessment & Plan Note (Signed)
Hyperlipidemia:Low fat diet discussed and encouraged.   Lipid Panel  Lab Results  Component Value Date   CHOL 161 03/11/2020   HDL 33 (L) 03/11/2020   LDLCALC 78 03/11/2020   LDLDIRECT 69 02/13/2009   TRIG 310 (H) 03/11/2020   CHOLHDL 4.9 (H) 03/11/2020  Updated lab needed at/ before next visit.

## 2020-07-19 NOTE — Assessment & Plan Note (Signed)
Patient educated about the importance of limiting  Carbohydrate intake , the need to commit to daily physical activity for a minimum of 30 minutes , and to commit weight loss. The fact that changes in all these areas will reduce or eliminate all together the development of diabetes is stressed.   Diabetic Labs Latest Ref Rng & Units 03/11/2020 03/10/2020 03/10/2020 03/10/2020 03/10/2020  HbA1c 4.0 - 5.6 % - 5.9 5.9 5.9 5.9(A)  Chol 100 - 199 mg/dL 161 - - - -  HDL >39 mg/dL 33(L) - - - -  Calc LDL 0 - 99 mg/dL 78 - - - -  Triglycerides 0 - 149 mg/dL 310(H) - - - -  Creatinine 0.57 - 1.00 mg/dL 1.01(H) - - - -   BP/Weight 07/15/2020 06/09/2020 04/15/2020 04/03/2020 03/30/2020 03/26/2020 3/56/7014  Systolic BP 103 013 143 888 757 972 820  Diastolic BP 82 82 84 80 85 75 85  Wt. (Lbs) 219.08 219 219.08 222 222 222 222  BMI 35.36 34.3 34.31 35.83 35.83 35.83 35.83   No flowsheet data found.

## 2020-07-19 NOTE — Assessment & Plan Note (Signed)
Controlled, no change in medication  

## 2020-07-19 NOTE — Progress Notes (Signed)
Sarah Coleman     MRN: [id number]      DOB: 06-23-1951   HPI Sarah Coleman is here for follow up and re-evaluation of chronic medical conditions, medication management and review of any available recent lab and radiology data.  Preventive health is updated, specifically  Cancer screening and Immunization.   Questions or concerns regarding consultations or procedures which the PT has had in the interim are  addressed. The PT denies any adverse reactions to current medications since the last visit.  There are no new concerns.  There are no specific complaints   ROS Denies recent fever or chills. Denies sinus pressure, nasal congestion, ear pain or sore throat. Denies chest congestion, productive cough or wheezing. Denies chest pains, palpitations and leg swelling Denies abdominal pain, nausea, vomiting,diarrhea or constipation.   Denies dysuria, frequency, hesitancy or incontinence. Denies joint pain, swelling and limitation in mobility. Denies headaches, seizures, numbness, or tingling. Denies depression, anxiety or insomnia. Denies skin break down or rash.   PE  BP 120/82   Pulse 95   Resp 16   Ht 5\' 6"  (1.676 m)   Wt 219 lb 1.3 oz (99.4 kg)   SpO2 95%   BMI 35.36 kg/m   Patient alert and oriented and in no cardiopulmonary distress.  HEENT: No facial asymmetry, EOMI,     Neck supple .  Chest: Clear to auscultation bilaterally.  CVS: S1, S2 no murmurs, no S3.Regular rate.  ABD: Soft non tender.   Ext: No edema  MS: Adequate ROM spine, shoulders, hips and knees.  Skin: Intact, no ulcerations or rash noted.  Psych: Good eye contact, normal affect. Memory intact not anxious or depressed appearing.  CNS: CN 2-12 intact, power,  normal throughout.no focal deficits noted.   Assessment & Plan  Essential hypertension Controlled, no change in medication DASH diet and commitment to daily physical activity for a minimum of 30 minutes discussed and encouraged, as a part  of hypertension management. The importance of attaining a healthy weight is also discussed.  BP/Weight 07/15/2020 06/09/2020 04/15/2020 04/03/2020 03/30/2020 03/26/2020 9/56/3875  Systolic BP 643 329 518 841 660 630 160  Diastolic BP 82 82 84 80 85 75 85  Wt. (Lbs) 219.08 219 219.08 222 222 222 222  BMI 35.36 34.3 34.31 35.83 35.83 35.83 35.83       Morbid obesity (HCC)  Patient re-educated about  the importance of commitment to a  minimum of 150 minutes of exercise per week as able.  The importance of healthy food choices with portion control discussed, as well as eating regularly and within a 12 hour window most days. The need to choose "clean , green" food 50 to 75% of the time is discussed, as well as to make water the primary drink and set a goal of 64 ounces water daily.    Weight /BMI 07/15/2020 06/09/2020 04/15/2020  WEIGHT 219 lb 1.3 oz 219 lb 219 lb 1.3 oz  HEIGHT 5\' 6"  5\' 7"  5\' 7"   BMI 35.36 kg/m2 34.3 kg/m2 34.31 kg/m2      Hyperlipidemia LDL goal <100 Hyperlipidemia:Low fat diet discussed and encouraged.   Lipid Panel  Lab Results  Component Value Date   CHOL 161 03/11/2020   HDL 33 (L) 03/11/2020   LDLCALC 78 03/11/2020   LDLDIRECT 69 02/13/2009   TRIG 310 (H) 03/11/2020   CHOLHDL 4.9 (H) 03/11/2020  Updated lab needed at/ before next visit.      Seasonal allergies Controlled, no  change in medication   Prediabetes Patient educated about the importance of limiting  Carbohydrate intake , the need to commit to daily physical activity for a minimum of 30 minutes , and to commit weight loss. The fact that changes in all these areas will reduce or eliminate all together the development of diabetes is stressed.   Diabetic Labs Latest Ref Rng & Units 03/11/2020 03/10/2020 03/10/2020 03/10/2020 03/10/2020  HbA1c 4.0 - 5.6 % - 5.9 5.9 5.9 5.9(A)  Chol 100 - 199 mg/dL 161 - - - -  HDL >39 mg/dL 33(L) - - - -  Calc LDL 0 - 99 mg/dL 78 - - - -  Triglycerides 0 - 149  mg/dL 310(H) - - - -  Creatinine 0.57 - 1.00 mg/dL 1.01(H) - - - -   BP/Weight 07/15/2020 06/09/2020 04/15/2020 04/03/2020 03/30/2020 03/26/2020 2/86/3817  Systolic BP 711 657 903 833 383 291 916  Diastolic BP 82 82 84 80 85 75 85  Wt. (Lbs) 219.08 219 219.08 222 222 222 222  BMI 35.36 34.3 34.31 35.83 35.83 35.83 35.83   No flowsheet data found.

## 2020-07-21 ENCOUNTER — Ambulatory Visit: Payer: PPO | Admitting: Urology

## 2020-07-21 NOTE — Progress Notes (Incomplete)
H&P  Chief Complaint: OAB  History of Present Illness: Sarah Coleman is a 69 y.o. year old female here for recheck of her urinary symptoms following myrbetriq regimen  12.7.2021:  (below copied from [facility] records):  10.26.2021: I last saw her in January 2020.  At that time she was being treated for atrophic vaginal changes.  She is still on estrogen cream 2 nights a week.  She does have urinary frequency and urgency.  She has fairly constant bladder discomfort.  She has a good stream, feels like she empties well.  She has no significant gross hematuria.  She has fairly regular dysuria for which she takes Pyridium.  She has no leakage.  Past Medical History:  Diagnosis Date  . Allergic rhinitis   . Allergy   . Anemia   . Arthritis   . GERD (gastroesophageal reflux disease) 2013  . Hyperlipidemia   . Hypertension   . Metabolic syndrome X 3818  . Morbid obesity ([place]) 2000  . Obesity   . Prediabetes 2010  . Seizures ([place])    as child; unknown etiology.    Past Surgical History:  Procedure Laterality Date  . BIOPSY  04/11/2017   Procedure: BIOPSY;  Surgeon: Danie Binder, MD;  [facility];  Service: Endoscopy;;  gastric bx's  . COLONOSCOPY N/A 10/07/2016   Procedure: COLONOSCOPY;  Surgeon: Danie Binder, MD;  [facility];  Service: Endoscopy;  Laterality: N/A;  9:30 AM  . COLONOSCOPY WITH PROPOFOL N/A 11/01/2016   Procedure: COLONOSCOPY WITH PROPOFOL;  Surgeon: Danie Binder, MD;  [facility];  Service: Endoscopy;  Laterality: N/A;  830   . COLONOSCOPY WITH PROPOFOL N/A 01/23/2018   Procedure: COLONOSCOPY WITH PROPOFOL;  Surgeon: Danie Binder, MD;  [facility];  Service: Endoscopy;  Laterality: N/A;  2:00pm - pt knows to arrive at 10:15  . ESOPHAGOGASTRODUODENOSCOPY (EGD) WITH PROPOFOL N/A 04/11/2017   Procedure: ESOPHAGOGASTRODUODENOSCOPY (EGD) WITH PROPOFOL;  Surgeon: Danie Binder, MD;  [facility];  Service:  Endoscopy;  Laterality: N/A;  10:45am  . GERD    . PARTIAL HYSTERECTOMY  1987   secondary to ovarian cyst   . POLYPECTOMY  11/01/2016   Procedure: POLYPECTOMY;  Surgeon: Danie Binder, MD;  [facility];  Service: Endoscopy;;  colon  . POLYPECTOMY  01/23/2018   Procedure: POLYPECTOMY;  Surgeon: Danie Binder, MD;  [facility];  Service: Endoscopy;;  ascending colon  . SAVORY DILATION N/A 04/11/2017   Procedure: SAVORY DILATION;  Surgeon: Danie Binder, MD;  [facility];  Service: Endoscopy;  Laterality: N/A;  . TUBAL LIGATION  1975    Home Medications:  (Not in a hospital admission)   Allergies:  Allergies  Allergen Reactions  . Cinnamon Swelling  . Ibuprofen Swelling  . Penicillins Nausea And Vomiting    Has patient had a PCN reaction causing immediate rash, facial/tongue/throat swelling, SOB or lightheadedness with hypotension:No Has patient had a PCN reaction causing severe rash involving mucus membranes or skin necrosis:No Has patient had a PCN reaction that required hospitalization:No Has patient had a PCN reaction occurring within the last 10 years:No If all of the above answers are "NO", then may proceed with Cephalosporin use.   Marland Kitchen Sweet Potato Swelling    Family History  Problem Relation Age of Onset  . Heart attack Mother   . Heart disease Mother 53  massive heart attack  . Prostate cancer Father   . Colon cancer Father 70  . Hypertension Sister   . Ovarian cancer Sister   . Cancer Brother 22       oral   . Hypertension Brother   . Heart disease Brother     Social History:  reports that she quit smoking about 45 years ago. She has a 3.00 pack-year smoking history. She has never used smokeless tobacco. She reports that she does not drink alcohol and does not use drugs.  ROS: A complete review of systems was performed.  All systems are negative except for pertinent findings as noted.  Physical Exam:  Vital signs in  last 24 hours:   General:  Alert and oriented, No acute distress HEENT: Normocephalic, atraumatic Neck: No JVD or lymphadenopathy Cardiovascular: Regular rate  Lungs: Normal inspiratory/expiratory excursion Abdomen: Soft, nontender, nondistended, no abdominal masses Back: No CVA tenderness Extremities: No edema Neurologic: Grossly intact  Laboratory Data:  No results found for this or any previous visit (from the past 24 hour(s)). No results found for this or any previous visit (from the past 240 hour(s)). Creatinine: No results for input(s): CREATININE in the last 168 hours.  Radiologic Imaging: No results found.  Impression/Assessment:  ***  Plan:  ***  Shaun Pitts 07/21/2020, 11:28 AM  [name]. Dahlstedt MD

## 2020-07-22 ENCOUNTER — Other Ambulatory Visit: Payer: Self-pay | Admitting: Family Medicine

## 2020-07-31 ENCOUNTER — Other Ambulatory Visit: Payer: Self-pay | Admitting: Cardiology

## 2020-08-13 ENCOUNTER — Other Ambulatory Visit: Payer: Self-pay | Admitting: Family Medicine

## 2020-08-18 ENCOUNTER — Ambulatory Visit (INDEPENDENT_AMBULATORY_CARE_PROVIDER_SITE_OTHER): Payer: Medicare HMO | Admitting: Urology

## 2020-08-18 ENCOUNTER — Encounter: Payer: Self-pay | Admitting: Urology

## 2020-08-18 ENCOUNTER — Other Ambulatory Visit: Payer: Self-pay

## 2020-08-18 VITALS — BP 165/97 | HR 87 | Temp 98.6°F | Ht 66.0 in | Wt 219.0 lb

## 2020-08-18 DIAGNOSIS — R3915 Urgency of urination: Secondary | ICD-10-CM

## 2020-08-18 DIAGNOSIS — R300 Dysuria: Secondary | ICD-10-CM

## 2020-08-18 DIAGNOSIS — N952 Postmenopausal atrophic vaginitis: Secondary | ICD-10-CM | POA: Diagnosis not present

## 2020-08-18 MED ORDER — SOLIFENACIN SUCCINATE 10 MG PO TABS: 10.0000 mg | ORAL_TABLET | Freq: Every day | ORAL | 3 refills | Status: DC

## 2020-08-18 NOTE — Progress Notes (Signed)
History of Present Illness:  10.26.2021: referred by Dr. Syliva Overman for dysuria.  I last saw her in January 2020.  At that time she was being treated for atrophic vaginal changes.  She is still on estrogen cream 2 nights a week.  She does have urinary frequency and urgency.  She has fairly constant bladder discomfort.  She has a good stream, feels like she empties well.  She has no significant gross hematuria.  She has fairly regular dysuria for which she takes Pyridium.  She has no leakage.  She was told to continue estrogen cream and given a trial of Myrbetriq.  She comes in today for follow-up.  1.4.2022: She seems to be doing well with Myrbetriq.  This is a branded medication, and more than likely will be expensive for her.  She would like to continue on medical therapy.  She does know to cut back a bit on her caffeine  Past Medical History:  Diagnosis Date  . Allergic rhinitis   . Allergy   . Anemia   . Arthritis   . GERD (gastroesophageal reflux disease) 2013  . Hyperlipidemia   . Hypertension   . Metabolic syndrome X 2000  . Morbid obesity ([facility]) 2000  . Obesity   . Prediabetes 2010  . Seizures ([facility])    as child; unknown etiology.    Past Surgical History:  Procedure Laterality Date  . BIOPSY  04/11/2017   Procedure: BIOPSY;  Surgeon: West Bali, MD;  [facility];  Service: Endoscopy;;  gastric bx's  . COLONOSCOPY N/A 10/07/2016   Procedure: COLONOSCOPY;  Surgeon: West Bali, MD;  [facility];  Service: Endoscopy;  Laterality: N/A;  9:30 AM  . COLONOSCOPY WITH PROPOFOL N/A 11/01/2016   Procedure: COLONOSCOPY WITH PROPOFOL;  Surgeon: West Bali, MD;  [facility];  Service: Endoscopy;  Laterality: N/A;  830   . COLONOSCOPY WITH PROPOFOL N/A 01/23/2018   Procedure: COLONOSCOPY WITH PROPOFOL;  Surgeon: West Bali, MD;  [facility];  Service: Endoscopy;  Laterality: N/A;  2:00pm - pt knows to arrive at 10:15   . ESOPHAGOGASTRODUODENOSCOPY (EGD) WITH PROPOFOL N/A 04/11/2017   Procedure: ESOPHAGOGASTRODUODENOSCOPY (EGD) WITH PROPOFOL;  Surgeon: West Bali, MD;  [facility];  Service: Endoscopy;  Laterality: N/A;  10:45am  . GERD    . PARTIAL HYSTERECTOMY  1987   secondary to ovarian cyst   . POLYPECTOMY  11/01/2016   Procedure: POLYPECTOMY;  Surgeon: West Bali, MD;  [facility];  Service: Endoscopy;;  colon  . POLYPECTOMY  01/23/2018   Procedure: POLYPECTOMY;  Surgeon: West Bali, MD;  [facility];  Service: Endoscopy;;  ascending colon  . SAVORY DILATION N/A 04/11/2017   Procedure: SAVORY DILATION;  Surgeon: West Bali, MD;  [facility];  Service: Endoscopy;  Laterality: N/A;  . TUBAL LIGATION  1975    Home Medications:  (Not in a hospital admission)   Allergies:  Allergies  Allergen Reactions  . Cinnamon Swelling  . Ibuprofen Swelling  . Penicillins Nausea And Vomiting    Has patient had a PCN reaction causing immediate rash, facial/tongue/throat swelling, SOB or lightheadedness with hypotension:No Has patient had a PCN reaction causing severe rash involving mucus membranes or skin necrosis:No Has patient had a PCN reaction that required hospitalization:No Has patient had a PCN reaction occurring within the last 10 years:No If all of the above answers  are "NO", then may proceed with Cephalosporin use.   Marland Kitchen Sweet Potato Swelling    Family History  Problem Relation Age of Onset  . Heart attack Mother   . Heart disease Mother 63       massive heart attack  . Prostate cancer Father   . Colon cancer Father 57  . Hypertension Sister   . Ovarian cancer Sister   . Cancer Brother 59       oral   . Hypertension Brother   . Heart disease Brother     Social History:  reports that she quit smoking about 46 years ago. She has a 3.00 pack-year smoking history. She has never used smokeless tobacco. She reports that she does  not drink alcohol and does not use drugs.  ROS: A complete review of systems was performed.  All systems are negative except for pertinent findings as noted.  Physical Exam:  Vital signs in last 24 hours: @VSRANGES @ General:  Alert and oriented, No acute distress HEENT: Normocephalic, atraumatic Neck: No JVD or lymphadenopathy Cardiovascular: Regular rate  Lungs: Normal inspiratory/expiratory excursion Extremities: No edema Neurologic: Grossly intact I have reviewed prior pt notes  I have reviewed notes from referring/previous physicians  I have reviewed urinalysis results      Impression/Assessment:  1.  OAB, better with OAB medication  2.  Atrophic vaginal changes  3.  Dysuria, not an issue currently  Plan:  1.  Continue estrogen cream 2 nights a week  2.  I sent a prescription in for solifenacin 10 mg for her to take Monday Wednesday and Friday  3.  I will see her back in a year for follow-up  Jorja Loa 08/18/2020, 12:40 PM  [name]. Jennise Both MD

## 2020-08-18 NOTE — Progress Notes (Signed)

## 2020-08-28 ENCOUNTER — Telehealth: Payer: Self-pay | Admitting: Cardiology

## 2020-08-28 NOTE — Telephone Encounter (Signed)
  Patient Consent for Virtual Visit         Sarah Coleman has provided verbal consent on 08/28/2020 for a virtual visit (video or telephone).   CONSENT FOR VIRTUAL VISIT FOR:  Sarah Coleman  By participating in this virtual visit I agree to the following:  I hereby voluntarily request, consent and authorize [facility] and its employed or contracted physicians, physician assistants, nurse practitioners or other licensed health care professionals (the Practitioner), to provide me with telemedicine health care services (the "Services") as deemed necessary by the treating Practitioner. I acknowledge and consent to receive the Services by the Practitioner via telemedicine. I understand that the telemedicine visit will involve communicating with the Practitioner through live audiovisual communication technology and the disclosure of certain medical information by electronic transmission. I acknowledge that I have been given the opportunity to request an in-person assessment or other available alternative prior to the telemedicine visit and am voluntarily participating in the telemedicine visit.  I understand that I have the right to withhold or withdraw my consent to the use of telemedicine in the course of my care at any time, without affecting my right to future care or treatment, and that the Practitioner or I may terminate the telemedicine visit at any time. I understand that I have the right to inspect all information obtained and/or recorded in the course of the telemedicine visit and may receive copies of available information for a reasonable fee.  I understand that some of the potential risks of receiving the Services via telemedicine include:  Marland Kitchen Delay or interruption in medical evaluation due to technological equipment failure or disruption; . Information transmitted may not be sufficient (e.g. poor resolution of images) to allow for appropriate medical decision making by the  Practitioner; and/or  . In rare instances, security protocols could fail, causing a breach of personal health information.  Furthermore, I acknowledge that it is my responsibility to provide information about my medical history, conditions and care that is complete and accurate to the best of my ability. I acknowledge that Practitioner's advice, recommendations, and/or decision may be based on factors not within their control, such as incomplete or inaccurate data provided by me or distortions of diagnostic images or specimens that may result from electronic transmissions. I understand that the practice of medicine is not an exact science and that Practitioner makes no warranties or guarantees regarding treatment outcomes. I acknowledge that a copy of this consent can be made available to me via my patient portal ([facility]), or I can request a printed copy by calling the office of [facility].    I understand that my insurance will be billed for this visit.   I have read or had this consent read to me. . I understand the contents of this consent, which adequately explains the benefits and risks of the Services being provided via telemedicine.  . I have been provided ample opportunity to ask questions regarding this consent and the Services and have had my questions answered to my satisfaction. . I give my informed consent for the services to be provided through the use of telemedicine in my medical care

## 2020-08-31 ENCOUNTER — Telehealth (INDEPENDENT_AMBULATORY_CARE_PROVIDER_SITE_OTHER): Payer: Medicare HMO | Admitting: Cardiology

## 2020-08-31 ENCOUNTER — Encounter: Payer: Self-pay | Admitting: Cardiology

## 2020-08-31 VITALS — BP 129/77 | HR 84 | Ht 66.0 in | Wt 220.0 lb

## 2020-08-31 DIAGNOSIS — E782 Mixed hyperlipidemia: Secondary | ICD-10-CM | POA: Diagnosis not present

## 2020-08-31 DIAGNOSIS — I10 Essential (primary) hypertension: Secondary | ICD-10-CM

## 2020-08-31 NOTE — Patient Instructions (Signed)
Your physician recommends that you schedule a follow-up appointment in: AS NEEDED WITH DR BRANCH  Your physician recommends that you continue on your current medications as directed. Please refer to the Current Medication list given to you today.  Thank you for choosing [facility]!!    

## 2020-08-31 NOTE — Progress Notes (Signed)
Virtual Visit via Telephone Note   This visit type was conducted due to national recommendations for restrictions regarding the COVID-19 Pandemic (e.g. social distancing) in an effort to limit this patient's exposure and mitigate transmission in our community.  Due to her co-morbid illnesses, this patient is at least at moderate risk for complications without adequate follow up.  This format is felt to be most appropriate for this patient at this time.  The patient did not have access to video technology/had technical difficulties with video requiring transitioning to audio format only (telephone).  All issues noted in this document were discussed and addressed.  No physical exam could be performed with this format.  Please refer to the patient's chart for her  consent to telehealth for [facility].    Date:  08/31/2020   ID:  Sarah Coleman, DOB 12/18/1950, MRN [id number] The patient was identified using 2 identifiers.  Patient Location: Home Provider Location: Office/Clinic  PCP:  Fayrene Helper, MD  Cardiologist:  Carlyle Dolly, MD  Electrophysiologist:  None   Evaluation Performed:  Follow-Up Visit  Chief Complaint:  Follow visit  History of Present Illness:    Sarah Coleman is a 70 y.o. female seen today for follow up of the following medical problems.   1. HTN - compliant with meds - home bp's 120s/70s    2. Hyperlipidemia 01/2019 TC 156 HDL 37 TG 271 LDL 83 compliant with statin and fish oil  02/2020 TC 161 TG 310 HDL 33 LDL 78  3. Chest pain - admit 02/09/17 with chest pain - described as atypical, associated with food. No evidence of ACS by enzymes or EKG. DIscharged withoutpatient stress arranged - 02/20/17 stress small mild intensity reversible apical defect, likely artifact cannot excluded mild ischemia. Overall low risk 01/2017 echo LVEF 60-65%, no WMAs  - no recent symptoms.     SH: has had covid vaccine x 3   The patient does not have  symptoms concerning for COVID-19 infection (fever, chills, cough, or new shortness of breath).    Past Medical History:  Diagnosis Date  . Allergic rhinitis   . Allergy   . Anemia   . Arthritis   . GERD (gastroesophageal reflux disease) 2013  . Hyperlipidemia   . Hypertension   . Metabolic syndrome X 9892  . Morbid obesity (Home) 2000  . Obesity   . Prediabetes 2010  . Seizures ([place])    as child; unknown etiology.   Past Surgical History:  Procedure Laterality Date  . BIOPSY  04/11/2017   Procedure: BIOPSY;  Surgeon: Danie Binder, MD;  [facility];  Service: Endoscopy;;  gastric bx's  . COLONOSCOPY N/A 10/07/2016   Procedure: COLONOSCOPY;  Surgeon: Danie Binder, MD;  [facility];  Service: Endoscopy;  Laterality: N/A;  9:30 AM  . COLONOSCOPY WITH PROPOFOL N/A 11/01/2016   Procedure: COLONOSCOPY WITH PROPOFOL;  Surgeon: Danie Binder, MD;  [facility];  Service: Endoscopy;  Laterality: N/A;  830   . COLONOSCOPY WITH PROPOFOL N/A 01/23/2018   Procedure: COLONOSCOPY WITH PROPOFOL;  Surgeon: Danie Binder, MD;  [facility];  Service: Endoscopy;  Laterality: N/A;  2:00pm - pt knows to arrive at 10:15  . ESOPHAGOGASTRODUODENOSCOPY (EGD) WITH PROPOFOL N/A 04/11/2017   Procedure: ESOPHAGOGASTRODUODENOSCOPY (EGD) WITH PROPOFOL;  Surgeon: Danie Binder, MD;  [facility];  Service: Endoscopy;  Laterality: N/A;  10:45am  . GERD    .  PARTIAL HYSTERECTOMY  1987   secondary to ovarian cyst   . POLYPECTOMY  11/01/2016   Procedure: POLYPECTOMY;  Surgeon: Danie Binder, MD;  [facility];  Service: Endoscopy;;  colon  . POLYPECTOMY  01/23/2018   Procedure: POLYPECTOMY;  Surgeon: Danie Binder, MD;  [facility];  Service: Endoscopy;;  ascending colon  . SAVORY DILATION N/A 04/11/2017   Procedure: SAVORY DILATION;  Surgeon: Danie Binder, MD;  [facility];  Service: Endoscopy;  Laterality: N/A;   . TUBAL LIGATION  1975     No outpatient medications have been marked as taking for the 08/31/20 encounter (Appointment) with [name], MD.     Allergies:   Cinnamon, Ibuprofen, Penicillins, and Sweet potato   Social History   Tobacco Use  . Smoking status: Former Smoker    Packs/day: 1.00    Years: 3.00    Pack years: 3.00    Quit date: 08/15/1974    Years since quitting: 46.0  . Smokeless tobacco: Never Used  Vaping Use  . Vaping Use: Never used  Substance Use Topics  . Alcohol use: No    Alcohol/week: 0.0 standard drinks  . Drug use: No     Family Hx: The patient's family history includes Cancer (age of onset: 22) in her brother; Colon cancer (age of onset: 54) in her father; Heart attack in her mother; Heart disease in her brother; Heart disease (age of onset: 60) in her mother; Hypertension in her brother and sister; Ovarian cancer in her sister; Prostate cancer in her father.  ROS:   Please see the history of present illness.     All other systems reviewed and are negative.   Prior CV studies:   The following studies were reviewed today:  01/2015 echo Study Conclusions  - Left ventricle: The cavity size was normal. Wall thickness was  normal. Systolic function was normal. The estimated ejection  fraction was in the range of 60% to 65%. Wall motion was normal;  there were no regional wall motion abnormalities. Doppler  parameters are consistent with abnormal left ventricular  relaxation (grade 1 diastolic dysfunction). - Mitral valve: There was trivial regurgitation. - Right atrium: Central venous pressure (est): 3 mm Hg. - Atrial septum: A patent foramen ovale cannot be excluded based on  available images. - Tricuspid valve: There was trivial regurgitation. - Pericardium, extracardiac: There was no pericardial effusion.  Impressions:  - Normal LV wall thickness with LVEF 30-16%, grade 1 diastolic  dysfunction. Trivial mitral and  tricuspid regurgitation. Cannot  exclude PFO based on available images. Unable to assess PASP.  01/2017 echo Study Conclusions  - Left ventricle: The cavity size was normal. Systolic function was normal. The estimated ejection fraction was in the range of 60% to 65%. Wall motion was normal; there were no regional wall motion abnormalities. Left ventricular diastolic function parameters were normal. - Atrial septum: No obvious PFO bubble study not done. Mid septum mobile   02/2017 Nuclear stress  No diagnostic ST segment changes to indicate ischemia. Low risk Duke treadmill score of 4.5.  Blood pressure demonstrated a hypertensive response to exercise.  Small, mild intensity, reversible apical defect, possibly related to variable breast attenuation artifact, less likely a small ischemic territory.  This is a low risk study.  Nuclear stress EF: 63%.   Labs/Other Tests and Data Reviewed:    EKG:  No ECG reviewed.  Recent Labs: 03/11/2020: ALT 19; BUN 9; Creatinine,  Ser 1.01; Hemoglobin 12.4; Platelets 321; Potassium 3.9; Sodium 139; TSH 0.867   Recent Lipid Panel Lab Results  Component Value Date/Time   CHOL 161 03/11/2020 09:12 AM   TRIG 310 (H) 03/11/2020 09:12 AM   HDL 33 (L) 03/11/2020 09:12 AM   CHOLHDL 4.9 (H) 03/11/2020 09:12 AM   CHOLHDL 4.2 01/30/2019 08:23 AM   LDLCALC 78 03/11/2020 09:12 AM   LDLCALC 83 01/30/2019 08:23 AM   LDLDIRECT 69 02/13/2009 03:54 PM    Wt Readings from Last 3 Encounters:  08/18/20 219 lb (99.3 kg)  07/15/20 219 lb 1.3 oz (99.4 kg)  06/09/20 219 lb (99.3 kg)     Risk Assessment/Calculations:      Objective:    Vital Signs:   Today's Vitals   08/31/20 1320  BP: 129/77  Pulse: 84  Weight: 220 lb (99.8 kg)  Height: 5\' 6"  (1.676 m)   Body mass index is 35.51 kg/m. Normal affect. Normal speech pattern and tone. Comfortable, no apparent distress. No audible signs of sob or wheezing.   ASSESSMENT & PLAN:     1. HTN -she is at goal, continue current meds   2. Hyperlipidemia - LDL is at goal, continue statin. Discussed dietary changes to improve TGs.           COVID-19 Education: The signs and symptoms of COVID-19 were discussed with the patient and how to seek care for testing (follow up with PCP or arrange E-visit).  The importance of social distancing was discussed today.  Time:   Today, I have spent 13 minutes with the patient with telehealth technology discussing the above problems.     Medication Adjustments/Labs and Tests Ordered: Current medicines are reviewed at length with the patient today.  Concerns regarding medicines are outlined above.   Tests Ordered: No orders of the defined types were placed in this encounter.   Medication Changes: No orders of the defined types were placed in this encounter.   Follow Up:  As needed  Signed, Carlyle Dolly, MD  08/31/2020 11:11 AM    [place]

## 2020-09-09 ENCOUNTER — Other Ambulatory Visit: Payer: Self-pay | Admitting: Family Medicine

## 2020-10-05 ENCOUNTER — Telehealth (INDEPENDENT_AMBULATORY_CARE_PROVIDER_SITE_OTHER): Payer: Medicare HMO | Admitting: Family Medicine

## 2020-10-05 ENCOUNTER — Other Ambulatory Visit: Payer: Self-pay

## 2020-10-05 ENCOUNTER — Encounter: Payer: Self-pay | Admitting: Family Medicine

## 2020-10-05 VITALS — Ht 66.0 in | Wt 220.0 lb

## 2020-10-05 DIAGNOSIS — I10 Essential (primary) hypertension: Secondary | ICD-10-CM

## 2020-10-05 DIAGNOSIS — J0100 Acute maxillary sinusitis, unspecified: Secondary | ICD-10-CM

## 2020-10-05 DIAGNOSIS — J209 Acute bronchitis, unspecified: Secondary | ICD-10-CM

## 2020-10-05 MED ORDER — FLUCONAZOLE 150 MG PO TABS: 150.0000 mg | ORAL_TABLET | Freq: Once | ORAL | 0 refills | Status: AC

## 2020-10-05 MED ORDER — BENZONATATE 100 MG PO CAPS: 100.0000 mg | ORAL_CAPSULE | Freq: Two times a day (BID) | ORAL | 0 refills | Status: DC | PRN

## 2020-10-05 MED ORDER — AZITHROMYCIN 250 MG PO TABS: ORAL_TABLET | ORAL | 0 refills | Status: DC

## 2020-10-05 NOTE — Progress Notes (Signed)
Virtual Visit via Telephone Note  I connected with Sarah Coleman on 10/05/20 at 10:00 AM EST by telephone and verified that I am speaking with the correct person using two identifiers.  Location: Patient: home Provider: office   I discussed the limitations, risks, security and privacy concerns of performing an evaluation and management service by telephone and the availability of in person appointments. I also discussed with the patient that there may be a patient responsible charge related to this service. The patient expressed understanding and agreed to proceed.   History of Present Illness: 5 day  h/o worsening head and chest congestion, associated with fever and chills intermittently. Nasal drainage has thickened , Sputum is thick . denies bilateral ear pressure, denies hearing loss and sore throat. Increasing fatigue , poor appetitie and sleep disturbed by cough. No improvement with OTC medication.    Observations/Objective:  Ht 5\' 6"  (1.676 m)   Wt 220 lb (99.8 kg)   BMI 35.51 kg/m   Good communication with no confusion and intact memory. Alert and oriented x 3 No signs of respiratory distress during speech   Assessment and Plan:  Acute sinusitis Z pack prescribed  Acute bronchitis Z pack, tessalon perle and fluconazole prescribed  Essential hypertension DASH diet and commitment to daily physical activity for a minimum of 30 minutes discussed and encouraged, as a part of hypertension management. The importance of attaining a healthy weight is also discussed.  BP/Weight 10/05/2020 08/31/2020 08/18/2020 07/15/2020 06/09/2020 04/15/2020 2/95/1884  Systolic BP - 166 063 016 010 932 355  Diastolic BP - 77 97 82 82 84 80  Wt. (Lbs) 120 220 219 219.08 219 219.08 222  BMI 19.37 35.51 35.35 35.36 34.3 34.31 35.83       Morbid obesity (HCC)  Patient re-educated about  the importance of commitment to a  minimum of 150 minutes of exercise per week as able.  The  importance of healthy food choices with portion control discussed, as well as eating regularly and within a 12 hour window most days. The need to choose "clean , green" food 50 to 75% of the time is discussed, as well as to make water the primary drink and set a goal of 64 ounces water daily.    Weight /BMI 10/05/2020 08/31/2020 08/18/2020  WEIGHT 220 lb 220 lb 219 lb  HEIGHT 5\' 6"  5\' 6"  5\' 6"   BMI 35.51 kg/m2 35.51 kg/m2 35.35 kg/m2       Follow Up Instructions:    I discussed the assessment and treatment plan with the patient. The patient was provided an opportunity to ask questions and all were answered. The patient agreed with the plan and demonstrated an understanding of the instructions.   The patient was advised to call back or seek an in-person evaluation if the symptoms worsen or if the condition fails to improve as anticipated.  I provided 14 minutes of non-face-to-face time during this encounter.   Tula Nakayama, MD

## 2020-10-05 NOTE — Patient Instructions (Signed)
F/U in April as before, call if you ned me sooner  Azithromycin, tessalon perle and fluconazole are prescribed  You may take robitussin one teaspoon at bedtime for excess cough  It is important that you exercise regularly at least 30 minutes 5 times a week. If you develop chest pain, have severe difficulty breathing, or feel very tired, stop exercising immediately and seek medical attention  Think about what you will eat, plan ahead. Choose " clean, green, fresh or frozen" over canned, processed or packaged foods which are more sugary, salty and fatty. 70 to 75% of food eaten should be vegetables and fruit. Three meals at set times with snacks allowed between meals, but they must be fruit or vegetables. Aim to eat over a 12 hour period , example 7 am to 7 pm, and STOP after  your last meal of the day. Drink water,generally about 64 ounces per day, no other drink is as healthy. Fruit juice is best enjoyed in a healthy way, by EATING the fruit. Thanks for choosing [facility], we consider it a privelige to serve you.

## 2020-10-06 ENCOUNTER — Encounter: Payer: Self-pay | Admitting: Family Medicine

## 2020-10-06 NOTE — Assessment & Plan Note (Signed)
  Patient re-educated about  the importance of commitment to a  minimum of 150 minutes of exercise per week as able.  The importance of healthy food choices with portion control discussed, as well as eating regularly and within a 12 hour window most days. The need to choose "clean , green" food 50 to 75% of the time is discussed, as well as to make water the primary drink and set a goal of 64 ounces water daily.    Weight /BMI 10/05/2020 08/31/2020 08/18/2020  WEIGHT 220 lb 220 lb 219 lb  HEIGHT 5\' 6"  5\' 6"  5\' 6"   BMI 35.51 kg/m2 35.51 kg/m2 35.35 kg/m2

## 2020-10-06 NOTE — Assessment & Plan Note (Signed)
Z pack, tessalon perle and fluconazole prescribed

## 2020-10-06 NOTE — Assessment & Plan Note (Signed)
Z pack prescribed 

## 2020-10-06 NOTE — Assessment & Plan Note (Signed)
DASH diet and commitment to daily physical activity for a minimum of 30 minutes discussed and encouraged, as a part of hypertension management. The importance of attaining a healthy weight is also discussed.  BP/Weight 10/05/2020 08/31/2020 08/18/2020 07/15/2020 06/09/2020 04/15/2020 3/55/9741  Systolic BP - 638 453 646 803 212 248  Diastolic BP - 77 97 82 82 84 80  Wt. (Lbs) 120 220 219 219.08 219 219.08 222  BMI 19.37 35.51 35.35 35.36 34.3 34.31 35.83

## 2020-10-20 ENCOUNTER — Other Ambulatory Visit: Payer: Self-pay | Admitting: Family Medicine

## 2020-10-26 ENCOUNTER — Other Ambulatory Visit: Payer: Self-pay | Admitting: Family Medicine

## 2020-10-28 ENCOUNTER — Telehealth: Payer: Self-pay

## 2020-10-28 ENCOUNTER — Telehealth: Payer: Medicare HMO | Admitting: Family Medicine

## 2020-10-28 ENCOUNTER — Encounter: Payer: Self-pay | Admitting: Internal Medicine

## 2020-10-28 ENCOUNTER — Other Ambulatory Visit: Payer: Self-pay | Admitting: *Deleted

## 2020-10-28 ENCOUNTER — Telehealth (INDEPENDENT_AMBULATORY_CARE_PROVIDER_SITE_OTHER): Payer: PPO | Admitting: Internal Medicine

## 2020-10-28 DIAGNOSIS — N898 Other specified noninflammatory disorders of vagina: Secondary | ICD-10-CM | POA: Diagnosis not present

## 2020-10-28 DIAGNOSIS — J019 Acute sinusitis, unspecified: Secondary | ICD-10-CM | POA: Diagnosis not present

## 2020-10-28 MED ORDER — MECLIZINE HCL 12.5 MG PO TABS: 12.5000 mg | ORAL_TABLET | Freq: Three times a day (TID) | ORAL | 1 refills | Status: DC | PRN

## 2020-10-28 MED ORDER — AZITHROMYCIN 250 MG PO TABS: ORAL_TABLET | ORAL | 0 refills | Status: DC

## 2020-10-28 MED ORDER — FLUCONAZOLE 150 MG PO TABS: 150.0000 mg | ORAL_TABLET | Freq: Once | ORAL | 0 refills | Status: DC

## 2020-10-28 NOTE — Telephone Encounter (Signed)
Medication sent to pt pharmacy 

## 2020-10-28 NOTE — Patient Instructions (Signed)

## 2020-10-28 NOTE — Telephone Encounter (Signed)
Patient called need med refill meclizine (ANTIVERT) 12.5 MG tablet Pharmacy: [facility]

## 2020-10-28 NOTE — Progress Notes (Signed)
Virtual Visit via Telephone Note   This visit type was conducted due to national recommendations for restrictions regarding the COVID-19 Pandemic (e.g. social distancing) in an effort to limit this patient's exposure and mitigate transmission in our community.  Due to her co-morbid illnesses, this patient is at least at moderate risk for complications without adequate follow up.  This format is felt to be most appropriate for this patient at this time.  The patient did not have access to video technology/had technical difficulties with video requiring transitioning to audio format only (telephone).  All issues noted in this document were discussed and addressed.  No physical exam could be performed with this format.    Evaluation Performed:  Follow-up visit  Date:  10/28/2020   ID:  Sarah, Coleman 09-Nov-1950, MRN [id number]  Patient Location: Home Provider Location: Office/Clinic  Participants: Patient Location of Patient: Home Location of Provider: Telehealth Consent was obtain for visit to be over via telehealth. I verified that I am speaking with the correct person using two identifiers.  PCP:  Fayrene Helper, MD   Chief Complaint:  Sinus pain and nasal congestion  History of Present Illness:    Sarah Coleman is a 70 y.o. female who has a televisit for c/o sinus pain and postnasal drip for about a week. She had cough initially, which has resolved. She has tried Sudafed with minimal help. She has been using Azelastin and Flonase. She denies fever, chills, sore throat, nausea or vomiting. She has been feeling dizzy with sinus headache. She has had COVID vaccine. Denies any recent sick contacts.  The patient does not have symptoms concerning for COVID-19 infection (fever, chills, cough, or new shortness of breath).   Past Medical, Surgical, Social History, Allergies, and Medications have been Reviewed.  Past Medical History:  Diagnosis Date  . Allergic rhinitis   .  Allergy   . Anemia   . Arthritis   . GERD (gastroesophageal reflux disease) 2013  . Hyperlipidemia   . Hypertension   . Metabolic syndrome X 6967  . Morbid obesity ([place]) 2000  . Obesity   . Prediabetes 2010  . Seizures ([place])    as child; unknown etiology.   Past Surgical History:  Procedure Laterality Date  . BIOPSY  04/11/2017   Procedure: BIOPSY;  Surgeon: Danie Binder, MD;  [facility];  Service: Endoscopy;;  gastric bx's  . COLONOSCOPY N/A 10/07/2016   Procedure: COLONOSCOPY;  Surgeon: Danie Binder, MD;  [facility];  Service: Endoscopy;  Laterality: N/A;  9:30 AM  . COLONOSCOPY WITH PROPOFOL N/A 11/01/2016   Procedure: COLONOSCOPY WITH PROPOFOL;  Surgeon: Danie Binder, MD;  [facility];  Service: Endoscopy;  Laterality: N/A;  830   . COLONOSCOPY WITH PROPOFOL N/A 01/23/2018   Procedure: COLONOSCOPY WITH PROPOFOL;  Surgeon: Danie Binder, MD;  [facility];  Service: Endoscopy;  Laterality: N/A;  2:00pm - pt knows to arrive at 10:15  . ESOPHAGOGASTRODUODENOSCOPY (EGD) WITH PROPOFOL N/A 04/11/2017   Procedure: ESOPHAGOGASTRODUODENOSCOPY (EGD) WITH PROPOFOL;  Surgeon: Danie Binder, MD;  [facility];  Service: Endoscopy;  Laterality: N/A;  10:45am  . GERD    . PARTIAL HYSTERECTOMY  1987   secondary to ovarian cyst   . POLYPECTOMY  11/01/2016   Procedure: POLYPECTOMY;  Surgeon: Danie Binder, MD;  [facility];  Service: Endoscopy;;  colon  . POLYPECTOMY  01/23/2018  Procedure: POLYPECTOMY;  Surgeon: Danie Binder, MD;  [facility];  Service: Endoscopy;;  ascending colon  . SAVORY DILATION N/A 04/11/2017   Procedure: SAVORY DILATION;  Surgeon: Danie Binder, MD;  [facility];  Service: Endoscopy;  Laterality: N/A;  . TUBAL LIGATION  1975     Current Meds  Medication Sig  . acetaminophen (TYLENOL) 500 MG tablet Take 1,000 mg by mouth 2 (two) times daily as needed for moderate  pain or headache.  Marland Kitchen aspirin EC 81 MG tablet Take 81 mg by mouth at bedtime.   . Azelastine HCl 137 MCG/SPRAY SOLN USE 1 SPRAY(S) IN EACH NOSTRIL TWICE DAILY AS NEEDED  . benzonatate (TESSALON PERLES) 100 MG capsule Take 1 capsule (100 mg total) by mouth 2 (two) times daily as needed for cough.  . Calcium Carbonate Antacid (EQ ANTACID PO) Take by mouth as needed.  Marland Kitchen CALCIUM-MAGNESIUM-ZINC PO Take 1 tablet by mouth 2 (two) times daily.   . carboxymethylcellulose (REFRESH PLUS) 0.5 % SOLN Place 1-2 drops into both eyes 3 (three) times daily as needed (for dry eyes).  . chlorpheniramine (CHLOR-TRIMETON) 4 MG tablet Take one tablet once daily as needed, for excessive nasal drainage  . chlorthalidone (HYGROTON) 25 MG tablet TAKE 1 TABLET BY MOUTH ONCE DAILY STOP  MAXZIDE  . estradiol (ESTRACE) 0.1 MG/GM vaginal cream USE 1 APPLICATORFUL VAGINALLY EVERY OTHER DAY AS NEEDED FOR  VAGINAL  DISCOMFORT  . famotidine (PEPCID) 40 MG tablet Take one tablet by mouth once daily , as needed, for heartburn  . fluticasone (FLONASE) 50 MCG/ACT nasal spray Use 2 spray(s) in each nostril once daily  . loratadine (CLARITIN) 10 MG tablet Take two tablets once daily for allergies  . lovastatin (MEVACOR) 40 MG tablet TAKE 1 TABLET BY MOUTH AT BEDTIME  . meclizine (ANTIVERT) 12.5 MG tablet Take 1 tablet (12.5 mg total) by mouth 3 (three) times daily as needed for dizziness.  . montelukast (SINGULAIR) 10 MG tablet TAKE 1 TABLET BY MOUTH AT BEDTIME  . Omega-3 300 MG CAPS Take 1,200 mg by mouth daily.  . potassium chloride SA (KLOR-CON) 20 MEQ tablet TAKE 2  BY MOUTH THREE TIMES DAILY  . solifenacin (VESICARE) 10 MG tablet Take 1 tablet (10 mg total) by mouth daily. (Patient taking differently: Take 10 mg by mouth daily. 3 TIMES WEEKLY)  . spironolactone (ALDACTONE) 25 MG tablet Take 1/2 (one-half) tablet by mouth once daily  . [DISCONTINUED] azithromycin (ZITHROMAX) 250 MG tablet Take two tablets on day one , then take one  tablet once daily for an additional four days  . fluconazole (DIFLUCAN) 150 MG tablet Take 1 tablet (150 mg total) by mouth once for 1 dose.     Allergies:   Cinnamon, Ibuprofen, Penicillins, and Sweet potato   ROS:   Please see the history of present illness. All other systems reviewed and are negative.   Labs/Other Tests and Data Reviewed:    Recent Labs: 03/11/2020: ALT 19; BUN 9; Creatinine, Ser 1.01; Hemoglobin 12.4; Platelets 321; Potassium 3.9; Sodium 139; TSH 0.867   Recent Lipid Panel Lab Results  Component Value Date/Time   CHOL 161 03/11/2020 09:12 AM   TRIG 310 (H) 03/11/2020 09:12 AM   HDL 33 (L) 03/11/2020 09:12 AM   CHOLHDL 4.9 (H) 03/11/2020 09:12 AM   CHOLHDL 4.2 01/30/2019 08:23 AM   LDLCALC 78 03/11/2020 09:12 AM   LDLCALC 83 01/30/2019 08:23 AM   LDLDIRECT 69 02/13/2009 03:54 PM  Wt Readings from Last 3 Encounters:  10/06/20 220 lb (99.8 kg)  08/31/20 220 lb (99.8 kg)  08/18/20 219 lb (99.3 kg)     ASSESSMENT & PLAN:    Acute sinusitis Prescribed Azithromycin Azelastine and Flonase for allergies Nasal saline spray for nasal congestion Use humidifier at night to avoid dry air  Fungal vaginitis ppx Fluconazole  Time:   Today, I have spent 11 minutes reviewing the chart, including problem list, medications, and with the patient with telehealth technology discussing the above problems.   Medication Adjustments/Labs and Tests Ordered: Current medicines are reviewed at length with the patient today.  Concerns regarding medicines are outlined above.   Tests Ordered: No orders of the defined types were placed in this encounter.   Medication Changes: Meds ordered this encounter  Medications  . azithromycin (ZITHROMAX) 250 MG tablet    Sig: Take two tablets on day one , then take one tablet once daily for an additional four days    Dispense:  6 tablet    Refill:  0  . fluconazole (DIFLUCAN) 150 MG tablet    Sig: Take 1 tablet (150 mg total)  by mouth once for 1 dose.    Dispense:  1 tablet    Refill:  0     Note: This dictation was prepared with Dragon dictation along with smaller phrase technology. Similar sounding words can be transcribed inadequately or may not be corrected upon review. Any transcriptional errors that result from this process are unintentional.      Disposition:  Follow up  Signed, Lindell Spar, MD  10/28/2020 8:24 AM     [place]

## 2020-10-29 ENCOUNTER — Other Ambulatory Visit: Payer: Self-pay | Admitting: Cardiology

## 2020-11-09 ENCOUNTER — Other Ambulatory Visit: Payer: Self-pay | Admitting: Internal Medicine

## 2020-11-09 DIAGNOSIS — N898 Other specified noninflammatory disorders of vagina: Secondary | ICD-10-CM

## 2020-11-18 ENCOUNTER — Encounter: Payer: Self-pay | Admitting: Nurse Practitioner

## 2020-11-18 ENCOUNTER — Telehealth (INDEPENDENT_AMBULATORY_CARE_PROVIDER_SITE_OTHER): Payer: PPO | Admitting: Nurse Practitioner

## 2020-11-18 DIAGNOSIS — T148XXA Other injury of unspecified body region, initial encounter: Secondary | ICD-10-CM | POA: Insufficient documentation

## 2020-11-18 MED ORDER — PREDNISONE 10 MG PO TABS: ORAL_TABLET | ORAL | 0 refills | Status: AC

## 2020-11-18 MED ORDER — TIZANIDINE HCL 4 MG PO TABS: 4.0000 mg | ORAL_TABLET | Freq: Four times a day (QID) | ORAL | 0 refills | Status: DC | PRN

## 2020-11-18 NOTE — Assessment & Plan Note (Signed)
-  Rx. Prednisone and tizanidine -pain started after lifting a heavy object -since she is having some pleuritic chest pain, we discussed red flags like SOB; and she states she will go to ED if symptoms get worse or SOB develops; denies leg/calf swelling

## 2020-11-18 NOTE — Progress Notes (Signed)
Acute Office Visit  Subjective:    Patient ID: Sarah Coleman, female    DOB: [birth year], 70 y.o.   MRN: [id number]  Chief Complaint  Patient presents with  . Chest Pain    Hurts when she breathes, ongoing since yesterday.    HPI Patient is in today for a telephone visit for pain when she is breathing. She does not feel short of breath.  She states that she helped her husband lift a heavy object and that may have caused her pain.  Pain is mild, but gets worse with deep inspiration.  Past Medical History:  Diagnosis Date  . Allergic rhinitis   . Allergy   . Anemia   . Arthritis   . GERD (gastroesophageal reflux disease) 2013  . Hyperlipidemia   . Hypertension   . Metabolic syndrome X 9163  . Morbid obesity ([place]) 2000  . Obesity   . Prediabetes 2010  . Seizures ([place])    as child; unknown etiology.    Past Surgical History:  Procedure Laterality Date  . BIOPSY  04/11/2017   Procedure: BIOPSY;  Surgeon: Danie Binder, MD;  [facility];  Service: Endoscopy;;  gastric bx's  . COLONOSCOPY N/A 10/07/2016   Procedure: COLONOSCOPY;  Surgeon: Danie Binder, MD;  [facility];  Service: Endoscopy;  Laterality: N/A;  9:30 AM  . COLONOSCOPY WITH PROPOFOL N/A 11/01/2016   Procedure: COLONOSCOPY WITH PROPOFOL;  Surgeon: Danie Binder, MD;  [facility];  Service: Endoscopy;  Laterality: N/A;  830   . COLONOSCOPY WITH PROPOFOL N/A 01/23/2018   Procedure: COLONOSCOPY WITH PROPOFOL;  Surgeon: Danie Binder, MD;  [facility];  Service: Endoscopy;  Laterality: N/A;  2:00pm - pt knows to arrive at 10:15  . ESOPHAGOGASTRODUODENOSCOPY (EGD) WITH PROPOFOL N/A 04/11/2017   Procedure: ESOPHAGOGASTRODUODENOSCOPY (EGD) WITH PROPOFOL;  Surgeon: Danie Binder, MD;  [facility];  Service: Endoscopy;  Laterality: N/A;  10:45am  . GERD    . PARTIAL HYSTERECTOMY  1987   secondary to ovarian cyst   . POLYPECTOMY  11/01/2016   Procedure:  POLYPECTOMY;  Surgeon: Danie Binder, MD;  [facility];  Service: Endoscopy;;  colon  . POLYPECTOMY  01/23/2018   Procedure: POLYPECTOMY;  Surgeon: Danie Binder, MD;  [facility];  Service: Endoscopy;;  ascending colon  . SAVORY DILATION N/A 04/11/2017   Procedure: SAVORY DILATION;  Surgeon: Danie Binder, MD;  [facility];  Service: Endoscopy;  Laterality: N/A;  . TUBAL LIGATION  1975    Family History  Problem Relation Age of Onset  . Heart attack Mother   . Heart disease Mother 3       massive heart attack  . Prostate cancer Father   . Colon cancer Father 69  . Hypertension Sister   . Ovarian cancer Sister   . Cancer Brother 41       oral   . Hypertension Brother   . Heart disease Brother     Social History   Socioeconomic History  . Marital status: Married    Spouse name: Not on file  . Number of children: 5  . Years of education: Not on file  . Highest education level: Not on file  Occupational History  . Occupation: retired   Tobacco Use  . Smoking status: Former Smoker    Packs/day: 1.00    Years: 3.00    Pack  years: 3.00    Quit date: 08/15/1974    Years since quitting: 46.2  . Smokeless tobacco: Never Used  Vaping Use  . Vaping Use: Never used  Substance and Sexual Activity  . Alcohol use: No    Alcohol/week: 0.0 standard drinks  . Drug use: No  . Sexual activity: Yes    Birth control/protection: Surgical    Comment: hyst  Other Topics Concern  . Not on file  Social History Narrative  . Not on file   Social Determinants of Health   Financial Resource Strain: Low Risk   . Difficulty of Paying Living Expenses: Not hard at all  Food Insecurity: No Food Insecurity  . Worried About Charity fundraiser in the Last Year: Never true  . Ran Out of Food in the Last Year: Never true  Transportation Needs: No Transportation Needs  . Lack of Transportation (Medical): No  . Lack of Transportation (Non-Medical): No   Physical Activity: Inactive  . Days of Exercise per Week: 0 days  . Minutes of Exercise per Session: 0 min  Stress: No Stress Concern Present  . Feeling of Stress : Not at all  Social Connections: Moderately Integrated  . Frequency of Communication with Friends and Family: More than three times a week  . Frequency of Social Gatherings with Friends and Family: More than three times a week  . Attends Religious Services: More than 4 times per year  . Active Member of Clubs or Organizations: No  . Attends Archivist Meetings: Never  . Marital Status: Married  Human resources officer Violence: Not At Risk  . Fear of Current or Ex-Partner: No  . Emotionally Abused: No  . Physically Abused: No  . Sexually Abused: No    Outpatient Medications Prior to Visit  Medication Sig Dispense Refill  . acetaminophen (TYLENOL) 500 MG tablet Take 1,000 mg by mouth 2 (two) times daily as needed for moderate pain or headache.    Marland Kitchen aspirin EC 81 MG tablet Take 81 mg by mouth at bedtime.     . Azelastine HCl 137 MCG/SPRAY SOLN USE 1 SPRAY(S) IN EACH NOSTRIL TWICE DAILY AS NEEDED 30 mL 5  . Calcium Carbonate Antacid (EQ ANTACID PO) Take by mouth as needed.    Marland Kitchen CALCIUM-MAGNESIUM-ZINC PO Take 1 tablet by mouth 2 (two) times daily.     . carboxymethylcellulose (REFRESH PLUS) 0.5 % SOLN Place 1-2 drops into both eyes 3 (three) times daily as needed (for dry eyes).    . chlorpheniramine (CHLOR-TRIMETON) 4 MG tablet Take one tablet once daily as needed, for excessive nasal drainage 14 tablet 1  . chlorthalidone (HYGROTON) 25 MG tablet TAKE 1 TABLET BY MOUTH ONCE DAILY STOP  MAXZIDE 90 tablet 3  . estradiol (ESTRACE) 0.1 MG/GM vaginal cream USE 1 APPLICATORFUL VAGINALLY EVERY OTHER DAY AS NEEDED FOR  VAGINAL  DISCOMFORT 43 g 3  . famotidine (PEPCID) 40 MG tablet Take one tablet by mouth once daily , as needed, for heartburn 30 tablet 0  . fluticasone (FLONASE) 50 MCG/ACT nasal spray Use 2 spray(s) in each  nostril once daily 16 g 5  . loratadine (CLARITIN) 10 MG tablet Take two tablets once daily for allergies 180 tablet 3  . lovastatin (MEVACOR) 40 MG tablet TAKE 1 TABLET BY MOUTH AT BEDTIME 90 tablet 0  . meclizine (ANTIVERT) 12.5 MG tablet Take 1 tablet (12.5 mg total) by mouth 3 (three) times daily as needed for dizziness. 30 tablet 1  .  montelukast (SINGULAIR) 10 MG tablet TAKE 1 TABLET BY MOUTH AT BEDTIME 90 tablet 0  . Omega-3 300 MG CAPS Take 1,200 mg by mouth daily.    . potassium chloride SA (KLOR-CON) 20 MEQ tablet TAKE 2  BY MOUTH THREE TIMES DAILY 540 tablet 0  . solifenacin (VESICARE) 10 MG tablet Take 1 tablet (10 mg total) by mouth daily. (Patient taking differently: Take 10 mg by mouth daily. 3 TIMES WEEKLY) 90 tablet 3  . spironolactone (ALDACTONE) 25 MG tablet Take 1/2 (one-half) tablet by mouth once daily 45 tablet 3  . azithromycin (ZITHROMAX) 250 MG tablet Take two tablets on day one , then take one tablet once daily for an additional four days 6 tablet 0  . benzonatate (TESSALON PERLES) 100 MG capsule Take 1 capsule (100 mg total) by mouth 2 (two) times daily as needed for cough. 30 capsule 0  . fluconazole (DIFLUCAN) 150 MG tablet TAKE ONE TABLET BY MOUTH AS A ONE-TIME DOSE 1 tablet 0   No facility-administered medications prior to visit.    Allergies  Allergen Reactions  . Cinnamon Swelling  . Ibuprofen Swelling  . Penicillins Nausea And Vomiting    Has patient had a PCN reaction causing immediate rash, facial/tongue/throat swelling, SOB or lightheadedness with hypotension:No Has patient had a PCN reaction causing severe rash involving mucus membranes or skin necrosis:No Has patient had a PCN reaction that required hospitalization:No Has patient had a PCN reaction occurring within the last 10 years:No If all of the above answers are "NO", then may proceed with Cephalosporin use.   Marland Kitchen Sweet Potato Swelling    Review of Systems  Constitutional: Negative for chills,  fatigue and fever.  HENT: Positive for congestion, ear pain, postnasal drip and sinus pressure. Negative for rhinorrhea and sinus pain.   Respiratory: Positive for wheezing. Negative for cough, chest tightness and shortness of breath.   Cardiovascular: Positive for chest pain.       Objective:    Physical Exam  Ht 5\' 6"  (1.676 m)   Wt 220 lb (99.8 kg)   BMI 35.51 kg/m  Wt Readings from Last 3 Encounters:  11/18/20 220 lb (99.8 kg)  10/06/20 220 lb (99.8 kg)  08/31/20 220 lb (99.8 kg)    There are no preventive care reminders to display for this patient.  There are no preventive care reminders to display for this patient.   Lab Results  Component Value Date   TSH 0.867 03/11/2020   Lab Results  Component Value Date   WBC 6.8 03/11/2020   HGB 12.4 03/11/2020   HCT 36.9 03/11/2020   MCV 91 03/11/2020   PLT 321 03/11/2020   Lab Results  Component Value Date   NA 139 03/11/2020   K 3.9 03/11/2020   CO2 23 03/11/2020   GLUCOSE 112 (H) 03/11/2020   BUN 9 03/11/2020   CREATININE 1.01 (H) 03/11/2020   BILITOT 0.5 03/11/2020   ALKPHOS 74 03/11/2020   AST 17 03/11/2020   ALT 19 03/11/2020   PROT 7.4 03/11/2020   ALBUMIN 4.8 03/11/2020   CALCIUM 10.0 03/11/2020   ANIONGAP 10 01/16/2018   Lab Results  Component Value Date   CHOL 161 03/11/2020   Lab Results  Component Value Date   HDL 33 (L) 03/11/2020   Lab Results  Component Value Date   LDLCALC 78 03/11/2020   Lab Results  Component Value Date   TRIG 310 (H) 03/11/2020   Lab Results  Component Value Date  CHOLHDL 4.9 (H) 03/11/2020   Lab Results  Component Value Date   HGBA1C 5.9 (A) 03/10/2020   HGBA1C 5.9 03/10/2020   HGBA1C 5.9 03/10/2020   HGBA1C 5.9 03/10/2020       Assessment & Plan:   Problem List Items Addressed This Visit      Musculoskeletal and Integument   Muscle strain    -Rx. Prednisone and tizanidine -pain started after lifting a heavy object -since she is having some  pleuritic chest pain, we discussed red flags like SOB; and she states she will go to ED if symptoms get worse or SOB develops; denies leg/calf swelling          Meds ordered this encounter  Medications  . predniSONE (DELTASONE) 10 MG tablet    Sig: Take 6 tablets (60 mg total) by mouth daily with breakfast for 1 day, THEN 5 tablets (50 mg total) daily with breakfast for 1 day, THEN 4 tablets (40 mg total) daily with breakfast for 1 day, THEN 3 tablets (30 mg total) daily with breakfast for 1 day, THEN 2 tablets (20 mg total) daily with breakfast for 1 day, THEN 1 tablet (10 mg total) daily with breakfast for 1 day.    Dispense:  21 tablet    Refill:  0  . tiZANidine (ZANAFLEX) 4 MG tablet    Sig: Take 1 tablet (4 mg total) by mouth every 6 (six) hours as needed for muscle spasms.    Dispense:  30 tablet    Refill:  0   Date:  11/18/2020   Location of Patient: Home Location of Provider: Office Consent was obtain for visit to be over via telehealth. I verified that I am speaking with the correct person using two identifiers.  I connected with  [name] on 11/18/20 via telephone and verified that I am speaking with the correct person using two identifiers.   I discussed the limitations of evaluation and management by telemedicine. The patient expressed understanding and agreed to proceed.  Time spent: 9 min   Noreene Larsson, NP

## 2020-11-19 ENCOUNTER — Telehealth: Payer: PPO | Admitting: Internal Medicine

## 2020-12-03 DIAGNOSIS — I10 Essential (primary) hypertension: Secondary | ICD-10-CM | POA: Diagnosis not present

## 2020-12-03 DIAGNOSIS — R7303 Prediabetes: Secondary | ICD-10-CM | POA: Diagnosis not present

## 2020-12-03 DIAGNOSIS — E785 Hyperlipidemia, unspecified: Secondary | ICD-10-CM | POA: Diagnosis not present

## 2020-12-04 LAB — CMP14+EGFR
ALT: 19 IU/L (ref 0–32)
AST: 16 IU/L (ref 0–40)
Albumin/Globulin Ratio: 2 (ref 1.2–2.2)
Albumin: 4.6 g/dL (ref 3.7–4.7)
Alkaline Phosphatase: 77 IU/L (ref 44–121)
BUN/Creatinine Ratio: 8 — ABNORMAL LOW (ref 12–28)
BUN: 9 mg/dL (ref 8–27)
Bilirubin Total: 0.6 mg/dL (ref 0.0–1.2)
CO2: 21 mmol/L (ref 20–29)
Calcium: 9.7 mg/dL (ref 8.7–10.3)
Chloride: 101 mmol/L (ref 96–106)
Creatinine, Ser: 1.1 mg/dL — ABNORMAL HIGH (ref 0.57–1.00)
Globulin, Total: 2.3 g/dL (ref 1.5–4.5)
Glucose: 111 mg/dL — ABNORMAL HIGH (ref 65–99)
Potassium: 4 mmol/L (ref 3.5–5.2)
Sodium: 139 mmol/L (ref 134–144)
Total Protein: 6.9 g/dL (ref 6.0–8.5)
eGFR: 54 mL/min/{1.73_m2} — ABNORMAL LOW (ref >59)

## 2020-12-04 LAB — HEMOGLOBIN A1C
Est. average glucose Bld gHb Est-mCnc: 131 mg/dL
Hgb A1c MFr Bld: 6.2 % — ABNORMAL HIGH (ref 4.8–5.6)

## 2020-12-04 LAB — LIPID PANEL
Chol/HDL Ratio: 5.3 ratio — ABNORMAL HIGH (ref 0.0–4.4)
Cholesterol, Total: 175 mg/dL (ref 100–199)
HDL: 33 mg/dL — ABNORMAL LOW (ref >39)
LDL Chol Calc (NIH): 85 mg/dL (ref 0–99)
Triglycerides: 350 mg/dL — ABNORMAL HIGH (ref 0–149)
VLDL Cholesterol Cal: 57 mg/dL — ABNORMAL HIGH (ref 5–40)

## 2020-12-08 ENCOUNTER — Ambulatory Visit (INDEPENDENT_AMBULATORY_CARE_PROVIDER_SITE_OTHER): Payer: PPO | Admitting: Family Medicine

## 2020-12-08 ENCOUNTER — Other Ambulatory Visit: Payer: Self-pay

## 2020-12-08 ENCOUNTER — Encounter: Payer: Self-pay | Admitting: Family Medicine

## 2020-12-08 VITALS — BP 138/84 | HR 82 | Resp 15 | Ht 66.0 in | Wt 225.1 lb

## 2020-12-08 DIAGNOSIS — Z1231 Encounter for screening mammogram for malignant neoplasm of breast: Secondary | ICD-10-CM

## 2020-12-08 DIAGNOSIS — E785 Hyperlipidemia, unspecified: Secondary | ICD-10-CM | POA: Diagnosis not present

## 2020-12-08 DIAGNOSIS — I10 Essential (primary) hypertension: Secondary | ICD-10-CM | POA: Diagnosis not present

## 2020-12-08 DIAGNOSIS — Z780 Asymptomatic menopausal state: Secondary | ICD-10-CM

## 2020-12-08 DIAGNOSIS — Z1329 Encounter for screening for other suspected endocrine disorder: Secondary | ICD-10-CM | POA: Diagnosis not present

## 2020-12-08 DIAGNOSIS — R7303 Prediabetes: Secondary | ICD-10-CM

## 2020-12-08 DIAGNOSIS — J302 Other seasonal allergic rhinitis: Secondary | ICD-10-CM

## 2020-12-08 DIAGNOSIS — E8881 Metabolic syndrome: Secondary | ICD-10-CM | POA: Diagnosis not present

## 2020-12-08 DIAGNOSIS — E559 Vitamin D deficiency, unspecified: Secondary | ICD-10-CM

## 2020-12-08 NOTE — Assessment & Plan Note (Signed)
Increased symptoms swish season change, continue current meds, recently had short course of steroids

## 2020-12-08 NOTE — Assessment & Plan Note (Signed)
Deteriorated  Patient educated about the importance of limiting  Carbohydrate intake , the need to commit to daily physical activity for a minimum of 30 minutes , and to commit weight loss. The fact that changes in all these areas will reduce or eliminate all together the development of diabetes is stressed.   Diabetic Labs Latest Ref Rng & Units 12/03/2020 03/11/2020 03/10/2020 03/10/2020 03/10/2020  HbA1c 4.8 - 5.6 % 6.2(H) - 5.9 5.9 5.9  Chol 100 - 199 mg/dL 175 161 - - -  HDL >39 mg/dL 33(L) 33(L) - - -  Calc LDL 0 - 99 mg/dL 85 78 - - -  Triglycerides 0 - 149 mg/dL 350(H) 310(H) - - -  Creatinine 0.57 - 1.00 mg/dL 1.10(H) 1.01(H) - - -   BP/Weight 12/08/2020 11/18/2020 10/05/2020 08/31/2020 08/18/2020 07/15/2020 82/42/3536  Systolic BP 144 - - 315 400 867 619  Diastolic BP 84 - - 77 97 82 82  Wt. (Lbs) 225.12 220 220 220 219 219.08 219  BMI 36.34 35.51 35.51 35.51 35.35 35.36 34.3   No flowsheet data found.

## 2020-12-08 NOTE — Assessment & Plan Note (Signed)
  Patient re-educated about  the importance of commitment to a  minimum of 150 minutes of exercise per week as able.  The importance of healthy food choices with portion control discussed, as well as eating regularly and within a 12 hour window most days. The need to choose "clean , green" food 50 to 75% of the time is discussed, as well as to make water the primary drink and set a goal of 64 ounces water daily.    Weight /BMI 12/08/2020 11/18/2020 10/05/2020  WEIGHT 225 lb 1.9 oz 220 lb 220 lb  HEIGHT 5\' 6"  5\' 6"  5\' 6"   BMI 36.34 kg/m2 35.51 kg/m2 35.51 kg/m2

## 2020-12-08 NOTE — Progress Notes (Signed)
Sarah Coleman     MRN: [id number]      DOB: [birth year]   HPI [name] is here for follow up and re-evaluation of chronic medical conditions, medication management and review of any available recent lab and radiology data.  Preventive health is updated, specifically  Cancer screening and Immunization.   Questions or concerns regarding consultations or procedures which the PT has had in the interim are  addressed. The PT denies any adverse reactions to current medications since the last visit.  There are no new concerns.  There are no specific complaints   ROS Denies recent fever or chills. Denies sinus pressure, nasal congestion, ear pain or sore throat. Denies chest congestion, productive cough or wheezing. Denies chest pains, palpitations and leg swelling Denies abdominal pain, nausea, vomiting,diarrhea or constipation.   Denies dysuria, frequency, hesitancy or incontinence. Denies joint pain, swelling and limitation in mobility. Denies headaches, seizures, numbness, or tingling. Denies depression, anxiety or insomnia. Denies skin break down or rash.   PE  BP 138/84   Pulse 82   Resp 15   Ht 5\' 6"  (1.676 m)   Wt 225 lb 1.9 oz (102.1 kg)   SpO2 95%   BMI 36.34 kg/m   Patient alert and oriented and in no cardiopulmonary distress.  HEENT: No facial asymmetry, EOMI,     Neck supple .  Chest: Clear to auscultation bilaterally.  CVS: S1, S2 no murmurs, no S3.Regular rate.  ABD: Soft non tender.   Ext: No edema  MS: Adequate ROM spine, shoulders, hips and knees.  Skin: Intact, no ulcerations or rash noted.  Psych: Good eye contact, normal affect. Memory intact not anxious or depressed appearing.  CNS: CN 2-12 intact, power,  normal throughout.no focal deficits noted.   Assessment & Plan  Essential hypertension Controlled, no change in medication DASH diet and commitment to daily physical activity for a minimum of 30 minutes discussed and encouraged, as a part  of hypertension management. The importance of attaining a healthy weight is also discussed.  BP/Weight 12/08/2020 11/18/2020 10/05/2020 08/31/2020 08/18/2020 07/15/2020 38/05/1750  Systolic BP 025 - - 852 778 242 353  Diastolic BP 84 - - 77 97 82 82  Wt. (Lbs) 225.12 220 220 220 219 219.08 219  BMI 36.34 35.51 35.51 35.51 35.35 35.36 34.3       Prediabetes Deteriorated  Patient educated about the importance of limiting  Carbohydrate intake , the need to commit to daily physical activity for a minimum of 30 minutes , and to commit weight loss. The fact that changes in all these areas will reduce or eliminate all together the development of diabetes is stressed.   Diabetic Labs Latest Ref Rng & Units 12/03/2020 03/11/2020 03/10/2020 03/10/2020 03/10/2020  HbA1c 4.8 - 5.6 % 6.2(H) - 5.9 5.9 5.9  Chol 100 - 199 mg/dL 175 161 - - -  HDL >39 mg/dL 33(L) 33(L) - - -  Calc LDL 0 - 99 mg/dL 85 78 - - -  Triglycerides 0 - 149 mg/dL 350(H) 310(H) - - -  Creatinine 0.57 - 1.00 mg/dL 1.10(H) 1.01(H) - - -   BP/Weight 12/08/2020 11/18/2020 10/05/2020 08/31/2020 08/18/2020 07/15/2020 61/44/3154  Systolic BP 008 - - 676 195 093 267  Diastolic BP 84 - - 77 97 82 82  Wt. (Lbs) 225.12 220 220 220 219 219.08 219  BMI 36.34 35.51 35.51 35.51 35.35 35.36 34.3   No flowsheet data found.    Seasonal allergies Increased symptoms  swish season change, continue current meds, recently had short course of steroids  Morbid obesity ([place])  Patient re-educated about  the importance of commitment to a  minimum of 150 minutes of exercise per week as able.  The importance of healthy food choices with portion control discussed, as well as eating regularly and within a 12 hour window most days. The need to choose "clean , green" food 50 to 75% of the time is discussed, as well as to make water the primary drink and set a goal of 64 ounces water daily.    Weight /BMI 12/08/2020 11/18/2020 10/05/2020  WEIGHT 225 lb 1.9 oz 220 lb 220  lb  HEIGHT 5\' 6"  5\' 6"  5\' 6"   BMI 36.34 kg/m2 35.51 kg/m2 35.51 kg/m2      Hyperlipidemia LDL goal <100 Deteriorated Updated lab needed at/ before next visit. Hyperlipidemia:Low fat diet discussed and encouraged.   Lipid Panel  Lab Results  Component Value Date   CHOL 175 12/03/2020   HDL 33 (L) 12/03/2020   LDLCALC 85 12/03/2020   LDLDIRECT 69 02/13/2009   TRIG 350 (H) 12/03/2020   CHOLHDL 5.3 (H) 99/37/1696       Metabolic syndrome X The increased risk of cardiovascular disease associated with this diagnosis, and the need to consistently work on lifestyle to change this is discussed. Following  a  heart healthy diet ,commitment to 30 minutes of exercise at least 5 days per week, as well as control of blood sugar and cholesterol , and achieving a healthy weight are all the areas to be addressed .

## 2020-12-08 NOTE — Patient Instructions (Addendum)
Annual physical exam early September, call if you need me before  Please schedule mammogram at checkout    Please reduce fried and fatty foods, also sweets and starchy foods  Fasting lipid, cmp and eGFr and hBA1C, cBC, tSH and vit d  5 days before follow up  Work on 5 to 8 pound weight loss please!

## 2020-12-08 NOTE — Assessment & Plan Note (Signed)
Deteriorated Updated lab needed at/ before next visit. Hyperlipidemia:Low fat diet discussed and encouraged.   Lipid Panel  Lab Results  Component Value Date   CHOL 175 12/03/2020   HDL 33 (L) 12/03/2020   LDLCALC 85 12/03/2020   LDLDIRECT 69 02/13/2009   TRIG 350 (H) 12/03/2020   CHOLHDL 5.3 (H) 12/03/2020

## 2020-12-08 NOTE — Assessment & Plan Note (Signed)
Controlled, no change in medication DASH diet and commitment to daily physical activity for a minimum of 30 minutes discussed and encouraged, as a part of hypertension management. The importance of attaining a healthy weight is also discussed.  BP/Weight 12/08/2020 11/18/2020 10/05/2020 08/31/2020 08/18/2020 07/15/2020 35/57/3220  Systolic BP 254 - - 270 623 762 831  Diastolic BP 84 - - 77 97 82 82  Wt. (Lbs) 225.12 220 220 220 219 219.08 219  BMI 36.34 35.51 35.51 35.51 35.35 35.36 34.3

## 2020-12-08 NOTE — Assessment & Plan Note (Signed)
The increased risk of cardiovascular disease associated with this diagnosis, and the need to consistently work on lifestyle to change this is discussed. Following  a  heart healthy diet ,commitment to 30 minutes of exercise at least 5 days per week, as well as control of blood sugar and cholesterol , and achieving a healthy weight are all the areas to be addressed .  

## 2020-12-09 ENCOUNTER — Ambulatory Visit: Payer: PPO | Admitting: Family Medicine

## 2020-12-09 ENCOUNTER — Other Ambulatory Visit: Payer: Self-pay | Admitting: Family Medicine

## 2020-12-25 ENCOUNTER — Encounter: Payer: Self-pay | Admitting: Nurse Practitioner

## 2020-12-25 ENCOUNTER — Ambulatory Visit (INDEPENDENT_AMBULATORY_CARE_PROVIDER_SITE_OTHER): Payer: PPO | Admitting: Nurse Practitioner

## 2020-12-25 ENCOUNTER — Other Ambulatory Visit: Payer: Self-pay

## 2020-12-25 VITALS — BP 131/81 | HR 96 | Temp 99.0°F | Resp 18 | Ht 66.0 in | Wt 222.0 lb

## 2020-12-25 DIAGNOSIS — H6590 Unspecified nonsuppurative otitis media, unspecified ear: Secondary | ICD-10-CM | POA: Insufficient documentation

## 2020-12-25 DIAGNOSIS — H6502 Acute serous otitis media, left ear: Secondary | ICD-10-CM

## 2020-12-25 MED ORDER — AZELASTINE HCL 0.1 % NA SOLN: NASAL | 12 refills | Status: DC

## 2020-12-25 MED ORDER — PREDNISONE 20 MG PO TABS: 40.0000 mg | ORAL_TABLET | Freq: Every day | ORAL | 0 refills | Status: AC

## 2020-12-25 NOTE — Assessment & Plan Note (Addendum)
-  she is already taking flonase -refilled azelastine -Rx. Prednisone -if no improvement by Monday, will consider abx; has PCN allergy, so z-pack or doxy

## 2020-12-25 NOTE — Progress Notes (Signed)
Acute Office Visit  Subjective:    Patient ID: Sarah Coleman, female    DOB: 1951/03/03, 70 y.o.   MRN: [id number]  Chief Complaint  Patient presents with  . Otalgia    L ear pain x 1 day.     HPI Patient is in today for left ear pain that started yesterday. Denies hearing changes.  Past Medical History:  Diagnosis Date  . Allergic rhinitis   . Allergy   . Anemia   . Arthritis   . GERD (gastroesophageal reflux disease) 2013  . Hyperlipidemia   . Hypertension   . Metabolic syndrome X 5993  . Morbid obesity ([place]) 2000  . Obesity   . Prediabetes 2010  . Seizures ([place])    as child; unknown etiology.    Past Surgical History:  Procedure Laterality Date  . BIOPSY  04/11/2017   Procedure: BIOPSY;  Surgeon: Danie Binder, MD;  [facility];  Service: Endoscopy;;  gastric bx's  . COLONOSCOPY N/A 10/07/2016   Procedure: COLONOSCOPY;  Surgeon: Danie Binder, MD;  [facility];  Service: Endoscopy;  Laterality: N/A;  9:30 AM  . COLONOSCOPY WITH PROPOFOL N/A 11/01/2016   Procedure: COLONOSCOPY WITH PROPOFOL;  Surgeon: Danie Binder, MD;  [facility];  Service: Endoscopy;  Laterality: N/A;  830   . COLONOSCOPY WITH PROPOFOL N/A 01/23/2018   Procedure: COLONOSCOPY WITH PROPOFOL;  Surgeon: Danie Binder, MD;  [facility];  Service: Endoscopy;  Laterality: N/A;  2:00pm - pt knows to arrive at 10:15  . ESOPHAGOGASTRODUODENOSCOPY (EGD) WITH PROPOFOL N/A 04/11/2017   Procedure: ESOPHAGOGASTRODUODENOSCOPY (EGD) WITH PROPOFOL;  Surgeon: Danie Binder, MD;  [facility];  Service: Endoscopy;  Laterality: N/A;  10:45am  . GERD    . PARTIAL HYSTERECTOMY  1987   secondary to ovarian cyst   . POLYPECTOMY  11/01/2016   Procedure: POLYPECTOMY;  Surgeon: Danie Binder, MD;  [facility];  Service: Endoscopy;;  colon  . POLYPECTOMY  01/23/2018   Procedure: POLYPECTOMY;  Surgeon: Danie Binder, MD;  [facility];  Service: Endoscopy;;  ascending colon  . SAVORY DILATION N/A 04/11/2017   Procedure: SAVORY DILATION;  Surgeon: Danie Binder, MD;  [facility];  Service: Endoscopy;  Laterality: N/A;  . TUBAL LIGATION  1975    Family History  Problem Relation Age of Onset  . Heart attack Mother   . Heart disease Mother 39       massive heart attack  . Prostate cancer Father   . Colon cancer Father 14  . Hypertension Sister   . Ovarian cancer Sister   . Cancer Brother 103       oral   . Hypertension Brother   . Heart disease Brother     Social History   Socioeconomic History  . Marital status: Married    Spouse name: Not on file  . Number of children: 5  . Years of education: Not on file  . Highest education level: Not on file  Occupational History  . Occupation: retired   Tobacco Use  . Smoking status: Former Smoker    Packs/day: 1.00    Years: 3.00    Pack years: 3.00    Quit date: 08/15/1974    Years since quitting: 46.3  . Smokeless tobacco: Never Used  Vaping Use  . Vaping Use: Never used  Substance and Sexual Activity  .  Alcohol use: No    Alcohol/week: 0.0 standard drinks  . Drug use: No  . Sexual activity: Yes    Birth control/protection: Surgical    Comment: hyst  Other Topics Concern  . Not on file  Social History Narrative  . Not on file   Social Determinants of Health   Financial Resource Strain: Low Risk   . Difficulty of Paying Living Expenses: Not hard at all  Food Insecurity: No Food Insecurity  . Worried About Charity fundraiser in the Last Year: Never true  . Ran Out of Food in the Last Year: Never true  Transportation Needs: No Transportation Needs  . Lack of Transportation (Medical): No  . Lack of Transportation (Non-Medical): No  Physical Activity: Inactive  . Days of Exercise per Week: 0 days  . Minutes of Exercise per Session: 0 min  Stress: No Stress Concern Present  . Feeling of Stress : Not at all  Social Connections:  Moderately Integrated  . Frequency of Communication with Friends and Family: More than three times a week  . Frequency of Social Gatherings with Friends and Family: More than three times a week  . Attends Religious Services: More than 4 times per year  . Active Member of Clubs or Organizations: No  . Attends Archivist Meetings: Never  . Marital Status: Married  Human resources officer Violence: Not At Risk  . Fear of Current or Ex-Partner: No  . Emotionally Abused: No  . Physically Abused: No  . Sexually Abused: No    Outpatient Medications Prior to Visit  Medication Sig Dispense Refill  . acetaminophen (TYLENOL) 500 MG tablet Take 1,000 mg by mouth 2 (two) times daily as needed for moderate pain or headache.    Marland Kitchen aspirin EC 81 MG tablet Take 81 mg by mouth at bedtime.     . Calcium Carbonate Antacid (EQ ANTACID PO) Take by mouth as needed.    Marland Kitchen CALCIUM-MAGNESIUM-ZINC PO Take 1 tablet by mouth 2 (two) times daily.     . carboxymethylcellulose (REFRESH PLUS) 0.5 % SOLN Place 1-2 drops into both eyes 3 (three) times daily as needed (for dry eyes).    . chlorpheniramine (CHLOR-TRIMETON) 4 MG tablet Take one tablet once daily as needed, for excessive nasal drainage 14 tablet 1  . chlorthalidone (HYGROTON) 25 MG tablet TAKE 1 TABLET BY MOUTH ONCE DAILY STOP  MAXZIDE 90 tablet 3  . estradiol (ESTRACE) 0.1 MG/GM vaginal cream USE 1 APPLICATORFUL VAGINALLY EVERY OTHER DAY AS NEEDED FOR  VAGINAL  DISCOMFORT 43 g 3  . fluticasone (FLONASE) 50 MCG/ACT nasal spray Use 2 spray(s) in each nostril once daily 16 g 0  . loratadine (CLARITIN) 10 MG tablet Take two tablets once daily for allergies 180 tablet 3  . lovastatin (MEVACOR) 40 MG tablet TAKE 1 TABLET BY MOUTH AT BEDTIME 90 tablet 0  . meclizine (ANTIVERT) 12.5 MG tablet Take 1 tablet (12.5 mg total) by mouth 3 (three) times daily as needed for dizziness. 30 tablet 1  . montelukast (SINGULAIR) 10 MG tablet TAKE 1 TABLET BY MOUTH AT BEDTIME 90  tablet 0  . Omega-3 300 MG CAPS Take 1,200 mg by mouth daily.    . potassium chloride SA (KLOR-CON) 20 MEQ tablet TAKE 2  BY MOUTH THREE TIMES DAILY 540 tablet 0  . solifenacin (VESICARE) 10 MG tablet Take 1 tablet (10 mg total) by mouth daily. (Patient taking differently: Take 10 mg by mouth daily. 3 TIMES WEEKLY) 90  tablet 3  . spironolactone (ALDACTONE) 25 MG tablet Take 1/2 (one-half) tablet by mouth once daily 45 tablet 3  . tiZANidine (ZANAFLEX) 4 MG tablet Take 1 tablet (4 mg total) by mouth every 6 (six) hours as needed for muscle spasms. 30 tablet 0  . Azelastine HCl 137 MCG/SPRAY SOLN USE 1 SPRAY(S) IN EACH NOSTRIL TWICE DAILY AS NEEDED 30 mL 5   No facility-administered medications prior to visit.    Allergies  Allergen Reactions  . Banana   . Cinnamon Swelling  . Ibuprofen Swelling  . Penicillins Nausea And Vomiting    Has patient had a PCN reaction causing immediate rash, facial/tongue/throat swelling, SOB or lightheadedness with hypotension:No Has patient had a PCN reaction causing severe rash involving mucus membranes or skin necrosis:No Has patient had a PCN reaction that required hospitalization:No Has patient had a PCN reaction occurring within the last 10 years:No If all of the above answers are "NO", then may proceed with Cephalosporin use.   Marland Kitchen Sweet Potato Swelling    Review of Systems  Constitutional: Positive for chills and fever.  HENT: Positive for congestion, postnasal drip and rhinorrhea.        Objective:    Physical Exam Constitutional:      Appearance: Normal appearance.  HENT:     Right Ear: Tympanic membrane, ear canal and external ear normal.     Left Ear: Tympanic membrane and external ear normal.     Ears:     Comments: Serous fluid behind both TMs    Nose: Nose normal.     Mouth/Throat:     Mouth: Mucous membranes are moist.     Pharynx: Oropharynx is clear.  Cardiovascular:     Rate and Rhythm: Normal rate and regular rhythm.      Pulses: Normal pulses.     Heart sounds: Normal heart sounds.  Pulmonary:     Effort: Pulmonary effort is normal.     Breath sounds: Normal breath sounds.  Neurological:     Mental Status: She is alert.     BP 131/81   Pulse 96   Temp 99 F (37.2 C)   Resp 18   Ht 5' 6" (1.676 m)   Wt 222 lb (100.7 kg)   SpO2 93%   BMI 35.83 kg/m  Wt Readings from Last 3 Encounters:  12/25/20 222 lb (100.7 kg)  12/08/20 225 lb 1.9 oz (102.1 kg)  11/18/20 220 lb (99.8 kg)    There are no preventive care reminders to display for this patient.  There are no preventive care reminders to display for this patient.   Lab Results  Component Value Date   TSH 0.867 03/11/2020   Lab Results  Component Value Date   WBC 6.8 03/11/2020   HGB 12.4 03/11/2020   HCT 36.9 03/11/2020   MCV 91 03/11/2020   PLT 321 03/11/2020   Lab Results  Component Value Date   NA 139 12/03/2020   K 4.0 12/03/2020   CO2 21 12/03/2020   GLUCOSE 111 (H) 12/03/2020   BUN 9 12/03/2020   CREATININE 1.10 (H) 12/03/2020   BILITOT 0.6 12/03/2020   ALKPHOS 77 12/03/2020   AST 16 12/03/2020   ALT 19 12/03/2020   PROT 6.9 12/03/2020   ALBUMIN 4.6 12/03/2020   CALCIUM 9.7 12/03/2020   ANIONGAP 10 01/16/2018   EGFR 54 (L) 12/03/2020   Lab Results  Component Value Date   CHOL 175 12/03/2020   Lab Results  Component  Value Date   HDL 33 (L) 12/03/2020   Lab Results  Component Value Date   LDLCALC 85 12/03/2020   Lab Results  Component Value Date   TRIG 350 (H) 12/03/2020   Lab Results  Component Value Date   CHOLHDL 5.3 (H) 12/03/2020   Lab Results  Component Value Date   HGBA1C 6.2 (H) 12/03/2020       Assessment & Plan:   Problem List Items Addressed This Visit      Nervous and Auditory   Serous otitis media - Primary    -she is already taking flonase -refilled azelastine -Rx. Prednisone -if no improvement by Monday, will consider abx; has PCN allergy, so z-pack or doxy      Relevant  Medications   azelastine (ASTELIN) 0.1 % nasal spray   predniSONE (DELTASONE) 20 MG tablet       Meds ordered this encounter  Medications  . azelastine (ASTELIN) 0.1 % nasal spray    Sig: Use 2 sprays in each nostril every 12 hours for 1 week; After that, you may use 1 spray in each nostril twice a day as needed for allergies/congestion    Dispense:  30 mL    Refill:  12  . predniSONE (DELTASONE) 20 MG tablet    Sig: Take 2 tablets (40 mg total) by mouth daily with breakfast for 5 days.    Dispense:  10 tablet    Refill:  0     Noreene Larsson, NP

## 2020-12-28 ENCOUNTER — Other Ambulatory Visit: Payer: Self-pay | Admitting: Internal Medicine

## 2020-12-28 ENCOUNTER — Telehealth: Payer: Self-pay

## 2020-12-28 DIAGNOSIS — H6502 Acute serous otitis media, left ear: Secondary | ICD-10-CM

## 2020-12-28 MED ORDER — AZITHROMYCIN 250 MG PO TABS: ORAL_TABLET | ORAL | 0 refills | Status: AC

## 2020-12-28 NOTE — Telephone Encounter (Signed)
Pt informed

## 2020-12-28 NOTE — Telephone Encounter (Signed)
Patient called left voice mail she is no better and needs antibiotic called in to [facility].

## 2020-12-28 NOTE — Telephone Encounter (Signed)
Reviewed office note. Sent Azithromycin to [facility].

## 2021-01-01 ENCOUNTER — Telehealth: Payer: Self-pay

## 2021-01-01 NOTE — Telephone Encounter (Signed)
Pt called and has a yeast infection, wondering if she can get something called in or if she hast to be seen?

## 2021-01-01 NOTE — Telephone Encounter (Signed)
Left message

## 2021-01-01 NOTE — Telephone Encounter (Signed)
We can't see her today. She can schedule for next week with available provider and use otc monistat and keep appt if that doesn't work

## 2021-01-04 ENCOUNTER — Other Ambulatory Visit: Payer: Self-pay | Admitting: Nurse Practitioner

## 2021-01-04 ENCOUNTER — Telehealth: Payer: Self-pay

## 2021-01-04 DIAGNOSIS — B379 Candidiasis, unspecified: Secondary | ICD-10-CM

## 2021-01-04 MED ORDER — FLUCONAZOLE 150 MG PO TABS: ORAL_TABLET | ORAL | 0 refills | Status: DC

## 2021-01-04 NOTE — Telephone Encounter (Signed)
Sent in diflucan

## 2021-01-04 NOTE — Telephone Encounter (Signed)
LVM letting pt know medication had been sent to pharmacy

## 2021-01-04 NOTE — Telephone Encounter (Signed)
Patient called said the antibiotic that was given to her last visit she now has an yeast infection. Can she get her medicine for yeast infection called in to [facility].  Patient call back # [phone].

## 2021-01-19 ENCOUNTER — Other Ambulatory Visit: Payer: Self-pay | Admitting: Family Medicine

## 2021-01-22 ENCOUNTER — Other Ambulatory Visit: Payer: Self-pay | Admitting: Family Medicine

## 2021-01-25 ENCOUNTER — Other Ambulatory Visit: Payer: Self-pay | Admitting: Family Medicine

## 2021-01-28 ENCOUNTER — Encounter: Payer: Self-pay | Admitting: Internal Medicine

## 2021-03-01 ENCOUNTER — Encounter: Payer: Self-pay | Admitting: Nurse Practitioner

## 2021-03-01 ENCOUNTER — Other Ambulatory Visit: Payer: Self-pay

## 2021-03-01 ENCOUNTER — Ambulatory Visit (INDEPENDENT_AMBULATORY_CARE_PROVIDER_SITE_OTHER): Payer: PPO | Admitting: Nurse Practitioner

## 2021-03-01 VITALS — BP 144/86 | HR 78 | Temp 97.6°F | Ht 66.0 in | Wt 218.0 lb

## 2021-03-01 DIAGNOSIS — R1084 Generalized abdominal pain: Secondary | ICD-10-CM

## 2021-03-01 DIAGNOSIS — M545 Low back pain, unspecified: Secondary | ICD-10-CM | POA: Diagnosis not present

## 2021-03-01 DIAGNOSIS — R102 Pelvic and perineal pain: Secondary | ICD-10-CM | POA: Diagnosis not present

## 2021-03-01 MED ORDER — METRONIDAZOLE 0.75 % EX GEL: 1.0000 "application " | Freq: Two times a day (BID) | CUTANEOUS | 0 refills | Status: AC

## 2021-03-01 MED ORDER — TIZANIDINE HCL 4 MG PO TABS: 4.0000 mg | ORAL_TABLET | Freq: Four times a day (QID) | ORAL | 0 refills | Status: DC | PRN

## 2021-03-01 NOTE — Assessment & Plan Note (Signed)
-  Rx. Tizanidine -discussed that it may make her drowsy, so no driving if she is taking this

## 2021-03-01 NOTE — Assessment & Plan Note (Signed)
-  has been using estrace cream -has erythema to left labia -When completing nuswab, she had pain further into her vagina than just her labia -Rx. Flagyl gel -if no improvement will consider GYN referral; she will notify office in 1 week if no improvement

## 2021-03-01 NOTE — Patient Instructions (Addendum)
Please get urine sample today for U/A and culture.  If no improvement in 1 week, return to clinic.

## 2021-03-01 NOTE — Progress Notes (Signed)
Acute Office Visit  Subjective:    Patient ID: Sarah Coleman, female    DOB: 1950/11/14, 70 y.o.   MRN: [id number]  Chief Complaint  Patient presents with   Dysuria    Ongoing for several weeks, also feels as if she has a yeast infection that has been ongoing for the same amount of time.    Dysuria   Patient is in today for abdominal pain.  She states that she has some vaginal burning.  She states the pain is worse when she takes a bath and is cleaning and drying her vagina. She estimates this has been occurring for about 3 weeks. She was treated with diflucan at the end of May, and she states that it helped some, but it never really resolved.  Past Medical History:  Diagnosis Date   Allergic rhinitis    Allergy    Anemia    Arthritis    GERD (gastroesophageal reflux disease) 2013   Hyperlipidemia    Hypertension    Metabolic syndrome X 4098   Morbid obesity ([place]) 2000   Obesity    Prediabetes 2010   Seizures ([place])    as child; unknown etiology.    Past Surgical History:  Procedure Laterality Date   BIOPSY  04/11/2017   Procedure: BIOPSY;  Surgeon: Danie Binder, MD;  [facility];  Service: Endoscopy;;  gastric bx's   COLONOSCOPY N/A 10/07/2016   Procedure: COLONOSCOPY;  Surgeon: Danie Binder, MD;  [facility];  Service: Endoscopy;  Laterality: N/A;  9:30 AM   COLONOSCOPY WITH PROPOFOL N/A 11/01/2016   Procedure: COLONOSCOPY WITH PROPOFOL;  Surgeon: Danie Binder, MD;  [facility];  Service: Endoscopy;  Laterality: N/A;  830    COLONOSCOPY WITH PROPOFOL N/A 01/23/2018   Procedure: COLONOSCOPY WITH PROPOFOL;  Surgeon: Danie Binder, MD;  [facility];  Service: Endoscopy;  Laterality: N/A;  2:00pm - pt knows to arrive at 10:15   ESOPHAGOGASTRODUODENOSCOPY (EGD) WITH PROPOFOL N/A 04/11/2017   Procedure: ESOPHAGOGASTRODUODENOSCOPY (EGD) WITH PROPOFOL;  Surgeon: Danie Binder, MD;  [facility];  Service:  Endoscopy;  Laterality: N/A;  10:45am   GERD     PARTIAL HYSTERECTOMY  1987   secondary to ovarian cyst    POLYPECTOMY  11/01/2016   Procedure: POLYPECTOMY;  Surgeon: Danie Binder, MD;  [facility];  Service: Endoscopy;;  colon   POLYPECTOMY  01/23/2018   Procedure: POLYPECTOMY;  Surgeon: Danie Binder, MD;  [facility];  Service: Endoscopy;;  ascending colon   SAVORY DILATION N/A 04/11/2017   Procedure: SAVORY DILATION;  Surgeon: Danie Binder, MD;  [facility];  Service: Endoscopy;  Laterality: N/A;   TUBAL LIGATION  1975    Family History  Problem Relation Age of Onset   Heart attack Mother    Heart disease Mother 38       massive heart attack   Prostate cancer Father    Colon cancer Father 71   Hypertension Sister    Ovarian cancer Sister    Cancer Brother 16       oral    Hypertension Brother    Heart disease Brother     Social History   Socioeconomic History   Marital status: Married    Spouse name: Not on file   Number of children: 5   Years of education: Not on file   Highest education level:  Not on file  Occupational History   Occupation: retired   Tobacco Use   Smoking status: Former    Packs/day: 1.00    Years: 3.00    Pack years: 3.00    Types: Cigarettes    Quit date: 08/15/1974    Years since quitting: 46.5   Smokeless tobacco: Never  Vaping Use   Vaping Use: Never used  Substance and Sexual Activity   Alcohol use: No    Alcohol/week: 0.0 standard drinks   Drug use: No   Sexual activity: Yes    Birth control/protection: Surgical    Comment: hyst  Other Topics Concern   Not on file  Social History Narrative   Not on file   Social Determinants of Health   Financial Resource Strain: Low Risk    Difficulty of Paying Living Expenses: Not hard at all  Food Insecurity: No Food Insecurity   Worried About Charity fundraiser in the Last Year: Never true   [place] in the Last Year: Never true   Transportation Needs: No Transportation Needs   Lack of Transportation (Medical): No   Lack of Transportation (Non-Medical): No  Physical Activity: Inactive   Days of Exercise per Week: 0 days   Minutes of Exercise per Session: 0 min  Stress: No Stress Concern Present   Feeling of Stress : Not at all  Social Connections: Moderately Integrated   Frequency of Communication with Friends and Family: More than three times a week   Frequency of Social Gatherings with Friends and Family: More than three times a week   Attends Religious Services: More than 4 times per year   Active Member of [employer] or Organizations: No   Attends Archivist Meetings: Never   Marital Status: Married  Human resources officer Violence: Not At Risk   Fear of Current or Ex-Partner: No   Emotionally Abused: No   Physically Abused: No   Sexually Abused: No    Outpatient Medications Prior to Visit  Medication Sig Dispense Refill   acetaminophen (TYLENOL) 500 MG tablet Take 1,000 mg by mouth 2 (two) times daily as needed for moderate pain or headache.     aspirin EC 81 MG tablet Take 81 mg by mouth at bedtime.      azelastine (ASTELIN) 0.1 % nasal spray Use 2 sprays in each nostril every 12 hours for 1 week; After that, you may use 1 spray in each nostril twice a day as needed for allergies/congestion 30 mL 12   Calcium Carbonate Antacid (EQ ANTACID PO) Take by mouth as needed.     CALCIUM-MAGNESIUM-ZINC PO Take 1 tablet by mouth 2 (two) times daily.      carboxymethylcellulose (REFRESH PLUS) 0.5 % SOLN Place 1-2 drops into both eyes 3 (three) times daily as needed (for dry eyes).     chlorpheniramine (CHLOR-TRIMETON) 4 MG tablet Take one tablet once daily as needed, for excessive nasal drainage 14 tablet 1   chlorthalidone (HYGROTON) 25 MG tablet TAKE 1 TABLET BY MOUTH ONCE DAILY STOP  MAXZIDE 90 tablet 3   estradiol (ESTRACE) 0.1 MG/GM vaginal cream USE 1 APPLICATORFUL VAGINALLY EVERY OTHER DAY AS NEEDED FOR   VAGINAL  DISCOMFORT 43 g 3   fluconazole (DIFLUCAN) 150 MG tablet Take 1 tablet PO now. Repeat dose in 72 hours. 2 tablet 0   fluticasone (FLONASE) 50 MCG/ACT nasal spray Use 2 spray(s) in each nostril once daily 16 g 0   loratadine (CLARITIN) 10 MG  tablet Take two tablets once daily for allergies 180 tablet 3   lovastatin (MEVACOR) 40 MG tablet TAKE 1 TABLET BY MOUTH AT BEDTIME 90 tablet 0   meclizine (ANTIVERT) 12.5 MG tablet Take 1 tablet (12.5 mg total) by mouth 3 (three) times daily as needed for dizziness. 30 tablet 1   montelukast (SINGULAIR) 10 MG tablet TAKE 1 TABLET BY MOUTH AT BEDTIME 90 tablet 0   Omega-3 300 MG CAPS Take 1,200 mg by mouth daily.     potassium chloride SA (KLOR-CON) 20 MEQ tablet TAKE 2 TABLETS BY MOUTH THREE TIMES DAILY 540 tablet 0   solifenacin (VESICARE) 10 MG tablet Take 1 tablet (10 mg total) by mouth daily. (Patient taking differently: Take 10 mg by mouth daily. 3 TIMES WEEKLY) 90 tablet 3   spironolactone (ALDACTONE) 25 MG tablet Take 1/2 (one-half) tablet by mouth once daily 45 tablet 3   tiZANidine (ZANAFLEX) 4 MG tablet Take 1 tablet (4 mg total) by mouth every 6 (six) hours as needed for muscle spasms. 30 tablet 0   No facility-administered medications prior to visit.    Allergies  Allergen Reactions   Banana    Cinnamon Swelling   Ibuprofen Swelling   Penicillins Nausea And Vomiting    Has patient had a PCN reaction causing immediate rash, facial/tongue/throat swelling, SOB or lightheadedness with hypotension:No Has patient had a PCN reaction causing severe rash involving mucus membranes or skin necrosis:No Has patient had a PCN reaction that required hospitalization:No Has patient had a PCN reaction occurring within the last 10 years:No If all of the above answers are "NO", then may proceed with Cephalosporin use.    Sweet Potato Swelling    Review of Systems  Constitutional: Negative.   Genitourinary:  Positive for vaginal pain. Negative  for dysuria, vaginal bleeding and vaginal discharge.  Musculoskeletal:  Positive for back pain.      Objective:    Physical Exam Exam conducted with a chaperone present Edwena Blow).  Constitutional:      Appearance: Normal appearance.  Abdominal:     General: There is no distension.     Palpations: There is no mass.     Tenderness: There is abdominal tenderness. There is no right CVA tenderness, left CVA tenderness, guarding or rebound.     Hernia: No hernia is present.  Genitourinary:    Pubic Area: No rash.      Labia:        Right: Tenderness present. No rash or injury.        Left: Tenderness and lesion present. No rash or injury.      Urethra: No urethral lesion.    Neurological:     Mental Status: She is alert.    BP (!) 144/86 (BP Location: Left Arm, Patient Position: Sitting, Cuff Size: Large)   Pulse 78   Temp 97.6 F (36.4 C) (Temporal)   Ht _0  (1.676 m)   Wt 218 lb (98.9 kg)   SpO2 93%   BMI 35.19 kg/m  Wt Readings from Last 3 Encounters:  03/01/21 218 lb (98.9 kg)  12/25/20 222 lb (100.7 kg)  12/08/20 225 lb 1.9 oz (102.1 kg)    Health Maintenance Due  Topic Date Due   Zoster Vaccines- Shingrix (1 of 2) Never done   COVID-19 Vaccine (4 - Booster for Moderna series) 10/24/2020    There are no preventive care reminders to display for this patient.   Lab Results  Component Value Date  TSH 0.867 03/11/2020   Lab Results  Component Value Date   WBC 6.8 03/11/2020   HGB 12.4 03/11/2020   HCT 36.9 03/11/2020   MCV 91 03/11/2020   PLT 321 03/11/2020   Lab Results  Component Value Date   NA 139 12/03/2020   K 4.0 12/03/2020   CO2 21 12/03/2020   GLUCOSE 111 (H) 12/03/2020   BUN 9 12/03/2020   CREATININE 1.10 (H) 12/03/2020   BILITOT 0.6 12/03/2020   ALKPHOS 77 12/03/2020   AST 16 12/03/2020   ALT 19 12/03/2020   PROT 6.9 12/03/2020   ALBUMIN 4.6 12/03/2020   CALCIUM 9.7 12/03/2020   ANIONGAP 10 01/16/2018   EGFR 54 (L) 12/03/2020    Lab Results  Component Value Date   CHOL 175 12/03/2020   Lab Results  Component Value Date   HDL 33 (L) 12/03/2020   Lab Results  Component Value Date   LDLCALC 85 12/03/2020   Lab Results  Component Value Date   TRIG 350 (H) 12/03/2020   Lab Results  Component Value Date   CHOLHDL 5.3 (H) 12/03/2020   Lab Results  Component Value Date   HGBA1C 6.2 (H) 12/03/2020       Assessment & Plan:   Problem List Items Addressed This Visit       Other   Low back pain    -Rx. Tizanidine -discussed that it may make her drowsy, so no driving if she is taking this       Relevant Medications   tiZANidine (ZANAFLEX) 4 MG tablet   Vaginal pain    -has been using estrace cream -has erythema to left labia -When completing nuswab, she had pain further into her vagina than just her labia -Rx. Flagyl gel -if no improvement will consider GYN referral; she will notify office in 1 week if no improvement        Relevant Medications   metroNIDAZOLE (METROGEL) 0.75 % gel   Other Relevant Orders   NuSwab Vaginitis Plus (VG+)   Other Visit Diagnoses     Generalized abdominal pain    -  Primary   Relevant Orders   Urine Culture        Meds ordered this encounter  Medications   metroNIDAZOLE (METROGEL) 0.75 % gel    Sig: Apply 1 application topically 2 (two) times daily for 5 days.    Dispense:  45 g    Refill:  0   tiZANidine (ZANAFLEX) 4 MG tablet    Sig: Take 1 tablet (4 mg total) by mouth every 6 (six) hours as needed for muscle spasms.    Dispense:  30 tablet    Refill:  0     Noreene Larsson, NP

## 2021-03-03 LAB — URINE CULTURE

## 2021-03-03 NOTE — Progress Notes (Signed)
Urine culture was negative.  Is she feeling any better with flagyl?

## 2021-03-05 LAB — NUSWAB VAGINITIS PLUS (VG+)
Candida albicans, NAA: NEGATIVE
Candida glabrata, NAA: NEGATIVE
Chlamydia trachomatis, NAA: NEGATIVE
Neisseria gonorrhoeae, NAA: NEGATIVE
Trich vag by NAA: NEGATIVE

## 2021-03-05 NOTE — Progress Notes (Signed)
No need for abx based on vaginal swab and urine cultures.

## 2021-03-12 ENCOUNTER — Other Ambulatory Visit: Payer: Self-pay | Admitting: Family Medicine

## 2021-03-16 ENCOUNTER — Other Ambulatory Visit: Payer: Self-pay | Admitting: Family Medicine

## 2021-03-16 ENCOUNTER — Telehealth: Payer: Self-pay

## 2021-03-16 ENCOUNTER — Other Ambulatory Visit: Payer: Self-pay

## 2021-03-16 DIAGNOSIS — R3915 Urgency of urination: Secondary | ICD-10-CM

## 2021-03-16 MED ORDER — SOLIFENACIN SUCCINATE 10 MG PO TABS: 10.0000 mg | ORAL_TABLET | Freq: Every day | ORAL | 3 refills | Status: DC

## 2021-03-16 NOTE — Telephone Encounter (Signed)
Refill submitted. 

## 2021-03-16 NOTE — Telephone Encounter (Signed)
Patient needing a refill on: solifenacin (VESICARE) 10 MG tablet   Please fill at:   [facility] [address] [place] Phone:  [phone]  Fax:  [phone]     Thanks, Helene Kelp

## 2021-03-18 ENCOUNTER — Other Ambulatory Visit: Payer: Self-pay | Admitting: Cardiology

## 2021-04-06 ENCOUNTER — Other Ambulatory Visit: Payer: Self-pay

## 2021-04-06 ENCOUNTER — Ambulatory Visit (INDEPENDENT_AMBULATORY_CARE_PROVIDER_SITE_OTHER): Payer: PPO

## 2021-04-06 DIAGNOSIS — Z0000 Encounter for general adult medical examination without abnormal findings: Secondary | ICD-10-CM

## 2021-04-06 NOTE — Patient Instructions (Signed)
Health Maintenance, Female Adopting a healthy lifestyle and getting preventive care are important in promoting health and wellness. Ask your health care provider about: The right schedule for you to have regular tests and exams. Things you can do on your own to prevent diseases and keep yourself healthy. What should I know about diet, weight, and exercise? Eat a healthy diet  Eat a diet that includes plenty of vegetables, fruits, low-fat dairy products, and lean protein. Do not eat a lot of foods that are high in solid fats, added sugars, or sodium.  Maintain a healthy weight Body mass index (BMI) is used to identify weight problems. It estimates body fat based on height and weight. Your health care provider can help determineyour BMI and help you achieve or maintain a healthy weight. Get regular exercise Get regular exercise. This is one of the most important things you can do for your health. Most adults should: Exercise for at least 150 minutes each week. The exercise should increase your heart rate and make you sweat (moderate-intensity exercise). Do strengthening exercises at least twice a week. This is in addition to the moderate-intensity exercise. Spend less time sitting. Even light physical activity can be beneficial. Watch cholesterol and blood lipids Have your blood tested for lipids and cholesterol at 71 years of age, then havethis test every 5 years. Have your cholesterol levels checked more often if: Your lipid or cholesterol levels are high. You are older than 71 years of age. You are at high risk for heart disease. What should I know about cancer screening? Depending on your health history and family history, you may need to have cancer screening at various ages. This may include screening for: Breast cancer. Cervical cancer. Colorectal cancer. Skin cancer. Lung cancer. What should I know about heart disease, diabetes, and high blood pressure? Blood pressure and heart  disease High blood pressure causes heart disease and increases the risk of stroke. This is more likely to develop in people who have high blood pressure readings, are of African descent, or are overweight. Have your blood pressure checked: Every 3-5 years if you are 18-39 years of age. Every year if you are 40 years old or older. Diabetes Have regular diabetes screenings. This checks your fasting blood sugar level. Have the screening done: Once every three years after age 40 if you are at a normal weight and have a low risk for diabetes. More often and at a younger age if you are overweight or have a high risk for diabetes. What should I know about preventing infection? Hepatitis B If you have a higher risk for hepatitis B, you should be screened for this virus. Talk with your health care provider to find out if you are at risk forhepatitis B infection. Hepatitis C Testing is recommended for: Everyone born from 1945 through 1965. Anyone with known risk factors for hepatitis C. Sexually transmitted infections (STIs) Get screened for STIs, including gonorrhea and chlamydia, if: You are sexually active and are younger than 71 years of age. You are older than 71 years of age and your health care provider tells you that you are at risk for this type of infection. Your sexual activity has changed since you were last screened, and you are at increased risk for chlamydia or gonorrhea. Ask your health care provider if you are at risk. Ask your health care provider about whether you are at high risk for HIV. Your health care provider may recommend a prescription medicine to help   prevent HIV infection. If you choose to take medicine to prevent HIV, you should first get tested for HIV. You should then be tested every 3 months for as long as you are taking the medicine. Pregnancy If you are about to stop having your period (premenopausal) and you may become pregnant, seek counseling before you get  pregnant. Take 400 to 800 micrograms (mcg) of folic acid every day if you become pregnant. Ask for birth control (contraception) if you want to prevent pregnancy. Osteoporosis and menopause Osteoporosis is a disease in which the bones lose minerals and strength with aging. This can result in bone fractures. If you are 65 years old or older, or if you are at risk for osteoporosis and fractures, ask your health care provider if you should: Be screened for bone loss. Take a calcium or vitamin D supplement to lower your risk of fractures. Be given hormone replacement therapy (HRT) to treat symptoms of menopause. Follow these instructions at home: Lifestyle Do not use any products that contain nicotine or tobacco, such as cigarettes, e-cigarettes, and chewing tobacco. If you need help quitting, ask your health care provider. Do not use street drugs. Do not share needles. Ask your health care provider for help if you need support or information about quitting drugs. Alcohol use Do not drink alcohol if: Your health care provider tells you not to drink. You are pregnant, may be pregnant, or are planning to become pregnant. If you drink alcohol: Limit how much you use to 0-1 drink a day. Limit intake if you are breastfeeding. Be aware of how much alcohol is in your drink. In the [place] one drink equals one 12 oz bottle of beer (355 mL), one 5 oz glass of wine (148 mL), or one 1 oz glass of hard liquor (44 mL). General instructions Schedule regular health, dental, and eye exams. Stay current with your vaccines. Tell your health care provider if: You often feel depressed. You have ever been abused or do not feel safe at home. Summary Adopting a healthy lifestyle and getting preventive care are important in promoting health and wellness. Follow your health care provider's instructions about healthy diet, exercising, and getting tested or screened for diseases. Follow your health care provider's  instructions on monitoring your cholesterol and blood pressure. This information is not intended to replace advice given to you by your health care provider. Make sure you discuss any questions you have with your healthcare provider. Document Revised: 07/25/2018 Document Reviewed: 07/25/2018 Elsevier Patient Education  2022 Elsevier Inc.  

## 2021-04-06 NOTE — Progress Notes (Signed)
Subjective:   Sarah Coleman is a 70 y.o. female who presents for Medicare Annual (Subsequent) preventive examination. I connected with  Sarah Coleman on 04/06/21 by a audio enabled telemedicine application and verified that I am speaking with the correct person using two identifiers.   I discussed the limitations of evaluation and management by telemedicine. The patient expressed understanding and agreed to proceed.   Location of patient: Home   Location of Provider: Office   Persons participating in virtual visit:   Review of Systems    Defer to PCP       Objective:    There were no vitals filed for this visit. There is no height or weight on file to calculate BMI.  Advanced Directives 04/06/2021 04/03/2020 11/29/2018 01/16/2018 11/27/2017 04/11/2017 02/09/2017  Does Patient Have a Medical Advance Directive? Yes Yes Yes No Yes Yes Yes  Type of Paramedic of [place];Living will - Living will - - [place];Living will [place];Living will  Does patient want to make changes to medical advance directive? - - - - No - Patient declined - -  Copy of [place] in Chart? - - - - - No - copy requested Yes  Would patient like information on creating a medical advance directive? - - - No - Patient declined - - (No Data)    Current Medications (verified) Outpatient Encounter Medications as of 04/06/2021  Medication Sig   acetaminophen (TYLENOL) 500 MG tablet Take 1,000 mg by mouth 2 (two) times daily as needed for moderate pain or headache.   aspirin EC 81 MG tablet Take 81 mg by mouth at bedtime.    azelastine (ASTELIN) 0.1 % nasal spray Use 2 sprays in each nostril every 12 hours for 1 week; After that, you may use 1 spray in each nostril twice a day as needed for allergies/congestion   Calcium Carbonate Antacid (EQ ANTACID PO) Take by mouth as needed.   CALCIUM-MAGNESIUM-ZINC PO Take 1 tablet by mouth 2  (two) times daily.    carboxymethylcellulose (REFRESH PLUS) 0.5 % SOLN Place 1-2 drops into both eyes 3 (three) times daily as needed (for dry eyes).   chlorpheniramine (CHLOR-TRIMETON) 4 MG tablet Take one tablet once daily as needed, for excessive nasal drainage   chlorthalidone (HYGROTON) 25 MG tablet TAKE 1 TABLET BY MOUTH ONCE DAILY STOP  MAXZIDE   estradiol (ESTRACE) 0.1 MG/GM vaginal cream USE 1 APPLICATORFUL VAGINALLY EVERY OTHER DAY AS NEEDED FOR  VAGINAL  DISCOMFORT   fluticasone (FLONASE) 50 MCG/ACT nasal spray Use 2 spray(s) in each nostril once daily   loratadine (CLARITIN) 10 MG tablet Take two tablets once daily for allergies   lovastatin (MEVACOR) 40 MG tablet TAKE 1 TABLET BY MOUTH AT BEDTIME   meclizine (ANTIVERT) 12.5 MG tablet Take 1 tablet (12.5 mg total) by mouth 3 (three) times daily as needed for dizziness.   montelukast (SINGULAIR) 10 MG tablet TAKE 1 TABLET BY MOUTH AT BEDTIME   Omega-3 300 MG CAPS Take 1,200 mg by mouth daily.   potassium chloride SA (KLOR-CON) 20 MEQ tablet TAKE 2 TABLETS BY MOUTH THREE TIMES DAILY   solifenacin (VESICARE) 10 MG tablet Take 1 tablet (10 mg total) by mouth daily.   spironolactone (ALDACTONE) 25 MG tablet Take 1/2 (one-half) tablet by mouth once daily   fluconazole (DIFLUCAN) 150 MG tablet Take 1 tablet PO now. Repeat dose in 72 hours. (Patient not taking: Reported on  04/06/2021)   tiZANidine (ZANAFLEX) 4 MG tablet Take 1 tablet (4 mg total) by mouth every 6 (six) hours as needed for muscle spasms. (Patient not taking: Reported on 04/06/2021)   No facility-administered encounter medications on file as of 04/06/2021.    Allergies (verified) Banana, Cinnamon, Ibuprofen, Penicillins, and Sweet potato   History: Past Medical History:  Diagnosis Date   Allergic rhinitis    Allergy    Anemia    Arthritis    GERD (gastroesophageal reflux disease) 2013   Hyperlipidemia    Hypertension    Metabolic syndrome X [id number]   Morbid obesity  ([place]) 2000   Obesity    Prediabetes 2010   Seizures ([place])    as child; unknown etiology.   Past Surgical History:  Procedure Laterality Date   BIOPSY  04/11/2017   Procedure: BIOPSY;  Surgeon: Danie Binder, MD;  [facility];  Service: Endoscopy;;  gastric bx's   COLONOSCOPY N/A 10/07/2016   Procedure: COLONOSCOPY;  Surgeon: Danie Binder, MD;  [facility];  Service: Endoscopy;  Laterality: N/A;  9:30 AM   COLONOSCOPY WITH PROPOFOL N/A 11/01/2016   Procedure: COLONOSCOPY WITH PROPOFOL;  Surgeon: Danie Binder, MD;  [facility];  Service: Endoscopy;  Laterality: N/A;  830    COLONOSCOPY WITH PROPOFOL N/A 01/23/2018   Procedure: COLONOSCOPY WITH PROPOFOL;  Surgeon: Danie Binder, MD;  [facility];  Service: Endoscopy;  Laterality: N/A;  2:00pm - pt knows to arrive at 10:15   ESOPHAGOGASTRODUODENOSCOPY (EGD) WITH PROPOFOL N/A 04/11/2017   Procedure: ESOPHAGOGASTRODUODENOSCOPY (EGD) WITH PROPOFOL;  Surgeon: Danie Binder, MD;  [facility];  Service: Endoscopy;  Laterality: N/A;  10:45am   GERD     PARTIAL HYSTERECTOMY  1987   secondary to ovarian cyst    POLYPECTOMY  11/01/2016   Procedure: POLYPECTOMY;  Surgeon: Danie Binder, MD;  [facility];  Service: Endoscopy;;  colon   POLYPECTOMY  01/23/2018   Procedure: POLYPECTOMY;  Surgeon: Danie Binder, MD;  [facility];  Service: Endoscopy;;  ascending colon   SAVORY DILATION N/A 04/11/2017   Procedure: SAVORY DILATION;  Surgeon: Danie Binder, MD;  [facility];  Service: Endoscopy;  Laterality: N/A;   TUBAL LIGATION  1975   Family History  Problem Relation Age of Onset   Heart attack Mother    Heart disease Mother 22       massive heart attack   Prostate cancer Father    Colon cancer Father 91   Hypertension Sister    Ovarian cancer Sister    Cancer Brother 58       oral    Hypertension Brother    Heart disease Brother    Social  History   Socioeconomic History   Marital status: Married    Spouse name: Not on file   Number of children: 5   Years of education: Not on file   Highest education level: Not on file  Occupational History   Occupation: retired   Tobacco Use   Smoking status: Former    Packs/day: 1.00    Years: 3.00    Pack years: 3.00    Types: Cigarettes    Quit date: 08/15/1974    Years since quitting: 46.6   Smokeless tobacco: Never  Vaping Use   Vaping Use: Never used  Substance and Sexual Activity   Alcohol use: No    Alcohol/week:  0.0 standard drinks   Drug use: No   Sexual activity: Not Currently    Birth control/protection: Surgical    Comment: hyst  Other Topics Concern   Not on file  Social History Narrative   Not on file   Social Determinants of Health   Financial Resource Strain: Low Risk    Difficulty of Paying Living Expenses: Not hard at all  Food Insecurity: No Food Insecurity   Worried About Charity fundraiser in the Last Year: Never true   [place] in the Last Year: Never true  Transportation Needs: No Transportation Needs   Lack of Transportation (Medical): No   Lack of Transportation (Non-Medical): No  Physical Activity: Insufficiently Active   Days of Exercise per Week: 3 days   Minutes of Exercise per Session: 30 min  Stress: No Stress Concern Present   Feeling of Stress : Not at all  Social Connections: Moderately Integrated   Frequency of Communication with Friends and Family: More than three times a week   Frequency of Social Gatherings with Friends and Family: More than three times a week   Attends Religious Services: More than 4 times per year   Active Member of [employer] or Organizations: No   Attends Music therapist: Never   Marital Status: Married    Tobacco Counseling Counseling given: Not Answered   Clinical Intake:  Pre-visit preparation completed: Yes  Pain : No/denies pain     Nutritional Risks: None Diabetes:  No  How often do you need to have someone help you when you read instructions, pamphlets, or other written materials from your doctor or pharmacy?: 2 - Rarely  Diabetic?NO  Interpreter Needed?: No  Information entered by :: Tremayne Sheldon J,CMA   Activities of Daily Living In your present state of health, do you have any difficulty performing the following activities: 04/06/2021  Hearing? N  Vision? Y  Difficulty concentrating or making decisions? N  Walking or climbing stairs? N  Dressing or bathing? N  Doing errands, shopping? N  Preparing Food and eating ? N  Using the Toilet? N  Do you have problems with loss of bowel control? N  Managing your Medications? N  Housekeeping or managing your Housekeeping? N  Some recent data might be hidden    Patient Care Team: Fayrene Helper, MD as PCP - General Branch, Alphonse Guild, MD as PCP - Cardiology (Cardiology) Franchot Gallo, MD as Consulting Physician (Urology) Harl Bowie Alphonse Guild, MD as Consulting Physician (Cardiology) Danie Binder, MD (Inactive) as Consulting Physician (Gastroenterology)  Indicate any recent Medical Services you may have received from other than [facility] providers in the past year (date may be approximate).     Assessment:   This is a routine wellness examination for [place].  Hearing/Vision screen No results found.  Dietary issues and exercise activities discussed: Current Exercise Habits: The patient does not participate in regular exercise at present   Goals Addressed   None    Depression Screen [facility] 2/9 Scores 04/06/2021 03/01/2021 12/25/2020 11/18/2020 10/28/2020 10/05/2020 07/15/2020  PHQ - 2 Score 0 0 0 0 0 0 0  PHQ- 9 Score - - - - - - -  Exception Documentation - - - - - - -    Fall Risk Fall Risk  04/06/2021 03/01/2021 12/25/2020 12/08/2020 11/18/2020  Falls in the past year? 1 0 0 0 0  Number falls in past yr: 0 0 0 0 0  Injury with Fall? 0  0 0 0 0  Risk for fall due to : No Fall Risks No Fall  Risks No Fall Risks - No Fall Risks  Follow up Falls evaluation completed Falls evaluation completed Falls evaluation completed - Falls evaluation completed    [place]:  Any stairs in or around the home? Yes  If so, are there any without handrails? No  Home free of loose throw rugs in walkways, pet beds, electrical cords, etc? Yes  Adequate lighting in your home to reduce risk of falls? Yes   ASSISTIVE DEVICES UTILIZED TO PREVENT FALLS:  Life alert? No  Use of a cane, walker or w/c? No  Grab bars in the bathroom? No  Shower chair or bench in shower? No  Elevated toilet seat or a handicapped toilet? No   TIMED UP AND GO:  Was the test performed?  N/A .  Length of time to ambulate 10 feet: N/A sec.     Cognitive Function:     6CIT Screen 04/06/2021 04/03/2020 11/29/2018 11/27/2017 09/21/2016  What Year? 0 points 0 points 0 points 0 points 0 points  What month? 0 points 0 points 0 points 0 points 0 points  What time? 0 points 0 points 0 points 0 points 0 points  Count back from 20 0 points 0 points 0 points 0 points 0 points  Months in reverse 0 points 0 points 0 points 0 points 0 points  Repeat phrase 2 points 0 points 2 points 2 points 0 points  Total Score 2 0 2 2 0    Immunizations Immunization History  Administered Date(s) Administered   Fluad Quad(high Dose 65+) 04/15/2019, 04/15/2020   Influenza Whole 05/08/2007   Influenza,inj,Quad PF,6+ Mos 04/23/2013, 07/28/2014, 05/14/2015, 04/28/2016, 05/09/2017, 05/31/2018   Moderna SARS-COV2 Booster Vaccination 06/26/2020   Moderna Sars-Covid-2 Vaccination 10/24/2019, 11/26/2019   Pneumococcal Conjugate-13 03/26/2014   Pneumococcal Polysaccharide-23 02/09/2016   Td 05/09/2006   Zoster, Live 11/01/2012    TDAP status: Due, Education has been provided regarding the importance of this vaccine. Advised may receive this vaccine at local pharmacy or Health Dept. Aware to provide a copy of the  vaccination record if obtained from local pharmacy or Health Dept. Verbalized acceptance and understanding.  Flu Vaccine status: Due, Education has been provided regarding the importance of this vaccine. Advised may receive this vaccine at local pharmacy or Health Dept. Aware to provide a copy of the vaccination record if obtained from local pharmacy or Health Dept. Verbalized acceptance and understanding.  Pneumococcal vaccine status: Up to date  Covid-19 vaccine status: Completed vaccines  Qualifies for Shingles Vaccine? Yes   Zostavax completed  N/A   Shingrix Completed?: No.    Education has been provided regarding the importance of this vaccine. Patient has been advised to call insurance company to determine out of pocket expense if they have not yet received this vaccine. Advised may also receive vaccine at local pharmacy or Health Dept. Verbalized acceptance and understanding.  Screening Tests Health Maintenance  Topic Date Due   Zoster Vaccines- Shingrix (1 of 2) Never done   COVID-19 Vaccine (4 - Booster for Moderna series) 10/24/2020   INFLUENZA VACCINE  03/15/2021   TETANUS/TDAP  11/18/2021 (Originally 05/09/2016)   MAMMOGRAM  04/13/2022   COLONOSCOPY (Pts 45-48yr Insurance coverage will need to be confirmed)  01/24/2028   DEXA SCAN  Completed   Hepatitis C Screening  Completed   PNA vac Low Risk Adult  Completed  HPV VACCINES  Aged Out    Health Maintenance  Health Maintenance Due  Topic Date Due   Zoster Vaccines- Shingrix (1 of 2) Never done   COVID-19 Vaccine (4 - Booster for Moderna series) 10/24/2020   INFLUENZA VACCINE  03/15/2021    Colorectal cancer screening: Type of screening: Colonoscopy. Completed 01/23/2018. Repeat every 10 years  Mammogram status: Ordered 12/08/2020. Pt provided with contact info and advised to call to schedule appt.   Bone Density status: Completed 04/13/2020. Results reflect: Bone density results: NORMAL. Repeat every 2 years.  Lung  Cancer Screening: (Low Dose CT Chest recommended if Age 71-80 years, 30 pack-year currently smoking OR have quit w/in 15years.) does not qualify.   Lung Cancer Screening Referral: No  Additional Screening:  Hepatitis C Screening: does qualify; Completed 06/03/2015  Vision Screening: Recommended annual ophthalmology exams for early detection of glaucoma and other disorders of the eye. Is the patient up to date with their annual eye exam?  Yes  Who is the provider or what is the name of the office in which the patient attends annual eye exams? [facility] Dr. Eddie Dibbles [place] If pt is not established with a provider, would they like to be referred to a provider to establish care? No .   Dental Screening: Recommended annual dental exams for proper oral hygiene  Community Resource Referral / Chronic Care Management: CRR required this visit?  No   CCM required this visit?  No      Plan:     I have personally reviewed and noted the following in the patient's chart:   Medical and social history Use of alcohol, tobacco or illicit drugs  Current medications and supplements including opioid prescriptions.  Functional ability and status Nutritional status Physical activity Advanced directives List of other physicians Hospitalizations, surgeries, and ER visits in previous 12 months Vitals Screenings to include cognitive, depression, and falls Referrals and appointments  In addition, I have reviewed and discussed with patient certain preventive protocols, quality metrics, and best practice recommendations. A written personalized care plan for preventive services as well as general preventive health recommendations were provided to patient.     Edgar Frisk, [facility]   04/06/2021   Nurse Notes: Non Face to Face 30 minute visit Encounter   Ms. Corinna Capra , Thank you for taking time to come for your Medicare Wellness Visit. I appreciate your ongoing commitment to your health goals.  Please review the following plan we discussed and let me know if I can assist you in the future.   These are the goals we discussed:  Goals      Blood Pressure < 140/90     Goal less than 140/90     Exercise 3x per week (30 min per time)     Recommend starting an exercise program 3 times a week at least 30 minutes at a time.     Increase water intake        This is a list of the screening recommended for you and due dates:  Health Maintenance  Topic Date Due   Zoster (Shingles) Vaccine (1 of 2) Never done   Flu Shot  03/15/2021   Tetanus Vaccine  11/18/2021*   Mammogram  04/13/2022   Colon Cancer Screening  01/24/2028   DEXA scan (bone density measurement)  Completed   COVID-19 Vaccine  Completed   Hepatitis C Screening: USPSTF Recommendation to screen - Ages 60-79 yo.  Completed   Pneumonia vaccines  Completed   HPV Vaccine  Aged Out  *Topic was postponed. The date shown is not the original due date.

## 2021-04-21 ENCOUNTER — Other Ambulatory Visit: Payer: Self-pay | Admitting: Family Medicine

## 2021-04-26 ENCOUNTER — Other Ambulatory Visit: Payer: Self-pay | Admitting: Family Medicine

## 2021-04-26 DIAGNOSIS — E559 Vitamin D deficiency, unspecified: Secondary | ICD-10-CM | POA: Diagnosis not present

## 2021-04-26 DIAGNOSIS — R7303 Prediabetes: Secondary | ICD-10-CM | POA: Diagnosis not present

## 2021-04-26 DIAGNOSIS — I10 Essential (primary) hypertension: Secondary | ICD-10-CM | POA: Diagnosis not present

## 2021-04-26 DIAGNOSIS — Z1329 Encounter for screening for other suspected endocrine disorder: Secondary | ICD-10-CM | POA: Diagnosis not present

## 2021-04-26 DIAGNOSIS — Z780 Asymptomatic menopausal state: Secondary | ICD-10-CM | POA: Diagnosis not present

## 2021-04-26 DIAGNOSIS — E785 Hyperlipidemia, unspecified: Secondary | ICD-10-CM | POA: Diagnosis not present

## 2021-04-26 DIAGNOSIS — E8881 Metabolic syndrome: Secondary | ICD-10-CM | POA: Diagnosis not present

## 2021-04-27 ENCOUNTER — Encounter: Payer: Self-pay | Admitting: Family Medicine

## 2021-04-27 ENCOUNTER — Ambulatory Visit (INDEPENDENT_AMBULATORY_CARE_PROVIDER_SITE_OTHER): Payer: PPO | Admitting: Family Medicine

## 2021-04-27 ENCOUNTER — Other Ambulatory Visit: Payer: Self-pay

## 2021-04-27 VITALS — BP 151/84 | HR 87 | Resp 18 | Ht 66.0 in | Wt 220.0 lb

## 2021-04-27 DIAGNOSIS — Z23 Encounter for immunization: Secondary | ICD-10-CM | POA: Diagnosis not present

## 2021-04-27 DIAGNOSIS — D126 Benign neoplasm of colon, unspecified: Secondary | ICD-10-CM

## 2021-04-27 DIAGNOSIS — Z0000 Encounter for general adult medical examination without abnormal findings: Secondary | ICD-10-CM

## 2021-04-27 DIAGNOSIS — E785 Hyperlipidemia, unspecified: Secondary | ICD-10-CM

## 2021-04-27 DIAGNOSIS — I10 Essential (primary) hypertension: Secondary | ICD-10-CM

## 2021-04-27 DIAGNOSIS — Z86010 Personal history of colonic polyps: Secondary | ICD-10-CM

## 2021-04-27 LAB — LIPID PANEL
Chol/HDL Ratio: 4.9 ratio — ABNORMAL HIGH (ref 0.0–4.4)
Cholesterol, Total: 162 mg/dL (ref 100–199)
HDL: 33 mg/dL — ABNORMAL LOW (ref >39)
LDL Chol Calc (NIH): 70 mg/dL (ref 0–99)
Triglycerides: 374 mg/dL — ABNORMAL HIGH (ref 0–149)
VLDL Cholesterol Cal: 59 mg/dL — ABNORMAL HIGH (ref 5–40)

## 2021-04-27 LAB — CBC WITH DIFFERENTIAL
Basophils Absolute: 0 10*3/uL (ref 0.0–0.2)
Basos: 0 %
EOS (ABSOLUTE): 0.1 10*3/uL (ref 0.0–0.4)
Eos: 1 %
Hematocrit: 36.1 % (ref 34.0–46.6)
Hemoglobin: 12.5 g/dL (ref 11.1–15.9)
Immature Grans (Abs): 0 10*3/uL (ref 0.0–0.1)
Immature Granulocytes: 0 %
Lymphocytes Absolute: 3.7 10*3/uL — ABNORMAL HIGH (ref 0.7–3.1)
Lymphs: 62 %
MCH: 31.2 pg (ref 26.6–33.0)
MCHC: 34.6 g/dL (ref 31.5–35.7)
MCV: 90 fL (ref 79–97)
Monocytes Absolute: 0.4 10*3/uL (ref 0.1–0.9)
Monocytes: 6 %
Neutrophils Absolute: 1.9 10*3/uL (ref 1.4–7.0)
Neutrophils: 31 %
RBC: 4.01 x10E6/uL (ref 3.77–5.28)
RDW: 13.6 % (ref 11.7–15.4)
WBC: 6 10*3/uL (ref 3.4–10.8)

## 2021-04-27 LAB — CMP14+EGFR
ALT: 13 IU/L (ref 0–32)
AST: 17 IU/L (ref 0–40)
Albumin/Globulin Ratio: 2.4 — ABNORMAL HIGH (ref 1.2–2.2)
Albumin: 4.8 g/dL — ABNORMAL HIGH (ref 3.7–4.7)
Alkaline Phosphatase: 68 IU/L (ref 44–121)
BUN/Creatinine Ratio: 14 (ref 12–28)
BUN: 13 mg/dL (ref 8–27)
Bilirubin Total: 0.5 mg/dL (ref 0.0–1.2)
CO2: 20 mmol/L (ref 20–29)
Calcium: 10.1 mg/dL (ref 8.7–10.3)
Chloride: 99 mmol/L (ref 96–106)
Creatinine, Ser: 0.9 mg/dL (ref 0.57–1.00)
Globulin, Total: 2 g/dL (ref 1.5–4.5)
Glucose: 108 mg/dL — ABNORMAL HIGH (ref 65–99)
Potassium: 4 mmol/L (ref 3.5–5.2)
Sodium: 138 mmol/L (ref 134–144)
Total Protein: 6.8 g/dL (ref 6.0–8.5)
eGFR: 68 mL/min/{1.73_m2} (ref >59)

## 2021-04-27 LAB — HEMOGLOBIN A1C
Est. average glucose Bld gHb Est-mCnc: 117 mg/dL
Hgb A1c MFr Bld: 5.7 % — ABNORMAL HIGH (ref 4.8–5.6)

## 2021-04-27 LAB — TSH: TSH: 1.24 u[IU]/mL (ref 0.450–4.500)

## 2021-04-27 LAB — VITAMIN D 25 HYDROXY (VIT D DEFICIENCY, FRACTURES): Vit D, 25-Hydroxy: 51.6 ng/mL (ref 30.0–100.0)

## 2021-04-27 MED ORDER — EZETIMIBE 10 MG PO TABS: 10.0000 mg | ORAL_TABLET | Freq: Every day | ORAL | 3 refills | Status: DC

## 2021-04-27 MED ORDER — SPIRONOLACTONE 25 MG PO TABS: 25.0000 mg | ORAL_TABLET | Freq: Every day | ORAL | 3 refills | Status: DC

## 2021-04-27 NOTE — Patient Instructions (Addendum)
Follow-up in 2 months re evaluate blood pressure and weight, call if you need me sooner.  Chem 7 and EGFR non fast 5 to 7 days before next visit  Flu vaccine in office today.  Please schedule mammogram at checkout.  Congratulations on improved blood sugar.  Please reduce butter or oils cheese egg yolks as your triglycerides are still very high.  New additional medication to lower triglycerides is zetia  1 daily continue lovastatin as before.  Your blood pressure is too high so the dose of spironolactone is increased to 1 tablet once daily starting today.  You are refderred to GI for re evaluation  Congratulations on improved blood sugar.  It is important that you exercise regularly at least 30 minutes 5 times a week. If you develop chest pain, have severe difficulty breathing, or feel very tired, stop exercising immediately and seek medical attention  Think about what you will eat, plan ahead. Choose " clean, green, fresh or frozen" over canned, processed or packaged foods which are more sugary, salty and fatty. 70 to 75% of food eaten should be vegetables and fruit. Three meals at set times with snacks allowed between meals, but they must be fruit or vegetables. Aim to eat over a 12 hour period , example 7 am to 7 pm, and STOP after  your last meal of the day. Drink water,generally about 64 ounces per day, no other drink is as healthy. Fruit juice is best enjoyed in a healthy way, by EATING the fruit. Thanks for choosing [facility], we consider it a privelige to serve you.

## 2021-04-27 NOTE — Assessment & Plan Note (Signed)

## 2021-04-27 NOTE — Progress Notes (Signed)
Sarah Coleman     MRN: [id number]      DOB: 06/23/1951  HPI: Patient is in for annual physical exam. Uncontrolled blood pressure and high lipids are addressed . Immunization is reviewed , and  updated if needed.   PE: BP (!) 151/84   Pulse 87   Resp 18   Ht '5\' 6"'$  (1.676 m)   Wt 220 lb (99.8 kg)   SpO2 94%   BMI 35.51 kg/m   Pleasant  female, alert and oriented x 3, in no cardio-pulmonary distress. Afebrile. HEENT No facial trauma or asymetry. Sinuses non tender.  Extra occullar muscles intact.. External ears normal, . Neck: supple, no adenopathy,JVD or thyromegaly.No bruits.  Chest: Clear to ascultation bilaterally.No crackles or wheezes. Non tender to palpation  Breast: No asymetry,no masses or lumps. No tenderness. No nipple discharge or inversion. No axillary or supraclavicular adenopathy  Cardiovascular system; Heart sounds normal,  S1 and  S2 ,no S3.  No murmur, or thrill. Apical beat not displaced Peripheral pulses normal.  Abdomen: Soft, non tender, no organomegaly or masses. No bruits. Bowel sounds normal. No guarding, tenderness or rebound.    Musculoskeletal exam: Full ROM of spine, hips , shoulders and knees. No deformity ,swelling or crepitus noted. No muscle wasting or atrophy.   Neurologic: Cranial nerves 2 to 12 intact. Power, tone ,sensation and reflexes normal throughout. No disturbance in gait. No tremor.  Skin: Intact, no ulceration, erythema , scaling or rash noted. Pigmentation normal throughout  Psych; Normal mood and affect. Judgement and concentration normal   Assessment & Plan:   Annual physical exam Annual exam as documented. Counseling done  re healthy lifestyle involving commitment to 150 minutes exercise per week, heart healthy diet, and attaining healthy weight.The importance of adequate sleep also discussed. Regular seat belt use and home safety, is also discussed. Changes in health habits are decided on by  the patient with goals and time frames  set for achieving them. Immunization and cancer screening needs are specifically addressed at this visit.   Essential hypertension Uncontrolled, dose inc on spironolactone DASH diet and commitment to daily physical activity for a minimum of 30 minutes discussed and encouraged, as a part of hypertension management. The importance of attaining a healthy weight is also discussed.  BP/Weight 04/27/2021 03/01/2021 12/25/2020 12/08/2020 11/18/2020 10/05/2020 0000000  Systolic BP 123XX123 123456 [id number] 0000000 - - [id number]  Diastolic BP 84 86 81 84 - - 77  Wt. (Lbs) 220 218 222 225.12 220 220 220  BMI 35.51 35.19 35.83 36.34 35.51 35.51 35.51       Morbid obesity (HCC)  Patient re-educated about  the importance of commitment to a  minimum of 150 minutes of exercise per week as able.  The importance of healthy food choices with portion control discussed, as well as eating regularly and within a 12 hour window most days. The need to choose "clean , green" food 50 to 75% of the time is discussed, as well as to make water the primary drink and set a goal of 64 ounces water daily.    Weight /BMI 04/27/2021 03/01/2021 12/25/2020  WEIGHT 220 lb 218 lb 222 lb  HEIGHT '5\' 6"'$  '5\' 6"'$  '5\' 6"'$   BMI 35.51 kg/m2 35.19 kg/m2 35.83 kg/m2      Hyperlipidemia LDL goal <100 Hyperlipidemia:Low fat diet discussed and encouraged.   Lipid Panel  Lab Results  Component Value Date   CHOL 162 04/26/2021   HDL 33 (L) 04/26/2021  LDLCALC 70 04/26/2021   LDLDIRECT 69 02/13/2009   TRIG 374 (H) 04/26/2021   CHOLHDL 4.9 (H) 04/26/2021     Add zetia  History of adenomatous polyp of colon GI re evaluation past due , referral entered

## 2021-04-28 ENCOUNTER — Encounter: Payer: Self-pay | Admitting: Family Medicine

## 2021-04-28 NOTE — Assessment & Plan Note (Signed)
Uncontrolled, dose inc on spironolactone DASH diet and commitment to daily physical activity for a minimum of 30 minutes discussed and encouraged, as a part of hypertension management. The importance of attaining a healthy weight is also discussed.  BP/Weight 04/27/2021 03/01/2021 12/25/2020 12/08/2020 11/18/2020 10/05/2020 0000000  Systolic BP 123XX123 123456 [id number] 0000000 - - Q000111Q  Diastolic BP 84 86 81 84 - - 77  Wt. (Lbs) 220 218 222 225.12 220 220 220  BMI 35.51 35.19 35.83 36.34 35.51 35.51 35.51

## 2021-04-28 NOTE — Assessment & Plan Note (Signed)
GI re evaluation past due , referral entered

## 2021-04-28 NOTE — Assessment & Plan Note (Signed)
  Patient re-educated about  the importance of commitment to a  minimum of 150 minutes of exercise per week as able.  The importance of healthy food choices with portion control discussed, as well as eating regularly and within a 12 hour window most days. The need to choose "clean , green" food 50 to 75% of the time is discussed, as well as to make water the primary drink and set a goal of 64 ounces water daily.    Weight /BMI 04/27/2021 03/01/2021 12/25/2020  WEIGHT 220 lb 218 lb 222 lb  HEIGHT '5\' 6"'$  '5\' 6"'$  '5\' 6"'$   BMI 35.51 kg/m2 35.19 kg/m2 35.83 kg/m2

## 2021-04-28 NOTE — Assessment & Plan Note (Signed)
Hyperlipidemia:Low fat diet discussed and encouraged.   Lipid Panel  Lab Results  Component Value Date   CHOL 162 04/26/2021   HDL 33 (L) 04/26/2021   LDLCALC 70 04/26/2021   LDLDIRECT 69 02/13/2009   TRIG 374 (H) 04/26/2021   CHOLHDL 4.9 (H) 04/26/2021     Add zetia

## 2021-04-29 ENCOUNTER — Telehealth: Payer: Self-pay

## 2021-04-29 NOTE — Telephone Encounter (Signed)
Patient called needs nurse to give her call, she does not understand how to take her blood pressure medicine and which one to take.  Call back # [phone].

## 2021-04-30 NOTE — Telephone Encounter (Signed)
Pt informed

## 2021-05-03 ENCOUNTER — Encounter: Payer: Self-pay | Admitting: Internal Medicine

## 2021-05-07 ENCOUNTER — Ambulatory Visit (HOSPITAL_COMMUNITY)
Admission: RE | Admit: 2021-05-07 | Discharge: 2021-05-07 | Disposition: A | Discharge status: Home / Self Care | Payer: PPO | Source: Ambulatory Visit | Attending: Family Medicine | Admitting: Family Medicine

## 2021-05-07 ENCOUNTER — Other Ambulatory Visit: Payer: Self-pay

## 2021-05-07 DIAGNOSIS — Z1231 Encounter for screening mammogram for malignant neoplasm of breast: Secondary | ICD-10-CM | POA: Diagnosis not present

## 2021-05-31 ENCOUNTER — Other Ambulatory Visit: Payer: Self-pay | Admitting: Family Medicine

## 2021-06-25 ENCOUNTER — Other Ambulatory Visit: Payer: Self-pay | Admitting: Family Medicine

## 2021-06-29 ENCOUNTER — Ambulatory Visit (INDEPENDENT_AMBULATORY_CARE_PROVIDER_SITE_OTHER): Payer: PPO | Admitting: Family Medicine

## 2021-06-29 ENCOUNTER — Other Ambulatory Visit: Payer: Self-pay

## 2021-06-29 ENCOUNTER — Encounter: Payer: Self-pay | Admitting: Family Medicine

## 2021-06-29 VITALS — BP 148/86 | HR 78 | Resp 18 | Ht 66.0 in | Wt 214.0 lb

## 2021-06-29 DIAGNOSIS — I10 Essential (primary) hypertension: Secondary | ICD-10-CM | POA: Diagnosis not present

## 2021-06-29 DIAGNOSIS — E785 Hyperlipidemia, unspecified: Secondary | ICD-10-CM

## 2021-06-29 DIAGNOSIS — J309 Allergic rhinitis, unspecified: Secondary | ICD-10-CM | POA: Diagnosis not present

## 2021-06-29 DIAGNOSIS — R7303 Prediabetes: Secondary | ICD-10-CM

## 2021-06-29 DIAGNOSIS — J302 Other seasonal allergic rhinitis: Secondary | ICD-10-CM

## 2021-06-29 DIAGNOSIS — R32 Unspecified urinary incontinence: Secondary | ICD-10-CM | POA: Insufficient documentation

## 2021-06-29 DIAGNOSIS — N393 Stress incontinence (female) (male): Secondary | ICD-10-CM

## 2021-06-29 MED ORDER — SPIRONOLACTONE 50 MG PO TABS: 50.0000 mg | ORAL_TABLET | Freq: Every day | ORAL | 2 refills | Status: DC

## 2021-06-29 MED ORDER — PREDNISONE 5 MG PO TABS: 5.0000 mg | ORAL_TABLET | Freq: Two times a day (BID) | ORAL | 0 refills | Status: AC

## 2021-06-29 NOTE — Assessment & Plan Note (Signed)
Improved   Patient re-educated about  the importance of commitment to a  minimum of 150 minutes of exercise per week as able.  The importance of healthy food choices with portion control discussed, as well as eating regularly and within a 12 hour window most days. The need to choose "clean , green" food 50 to 75% of the time is discussed, as well as to make water the primary drink and set a goal of 64 ounces water daily.    Weight /BMI 06/29/2021 04/27/2021 03/01/2021  WEIGHT 214 lb 220 lb 218 lb  HEIGHT 5\' 6"  5\' 6"  5\' 6"   BMI 34.54 kg/m2 35.51 kg/m2 35.19 kg/m2

## 2021-06-29 NOTE — Progress Notes (Signed)
Sarah Coleman     MRN: [id number]      DOB: May 26, 1951   HPI Sarah Coleman is here for follow up and re-evaluation of chronic medical conditions, specifically hypertension and obesity. medication management and review of any available recent lab and radiology data.  Preventive health is updated, specifically  Cancer screening and Immunization.   Questions or concerns regarding consultations or procedures which the PT has had in the interim are  addressed. The PT denies any adverse reactions to current medications since the last visit.  C/o increased sinus prressure and headache across cheeks and forehead x 3 days, no fever or chlls, using all her allergy meds  ROS Denies sinus pressure, nasal congestion, ear pain or sore throat. Denies chest congestion, productive cough or wheezing. Denies chest pains, palpitations and leg swelling Denies abdominal pain, nausea, vomiting,diarrhea or constipation.   Denies dysuria, frequency, hesitancy or incontinence. Denies joint pain, swelling and limitation in mobility. Denies headaches, seizures, numbness, or tingling. Denies depression, anxiety or insomnia. Denies skin break down or rash.   PE  BP (!) 148/86   Pulse 78   Resp 18   Ht 5\' 6"  (1.676 m)   Wt 214 lb (97.1 kg)   SpO2 90%   BMI 34.54 kg/m   Patient alert and oriented and in no cardiopulmonary distress.  HEENT: No facial asymmetry, EOMI,     Neck supple .Nasal congestion, no sinus tenderness  Chest: Clear to auscultation bilaterally.  CVS: S1, S2 no murmurs, no S3.Regular rate.  ABD: Soft non tender.   Ext: No edema  MS: Adequate ROM spine, shoulders, hips and knees.  Skin: Intact, no ulcerations or rash noted.  Psych: Good eye contact, normal affect. Memory intact not anxious or depressed appearing.  CNS: CN 2-12 intact, power,  normal throughout.no focal deficits noted.   Assessment & Plan  Essential hypertension Uncontrolled, inc spironolactone dose and reduce  salt DASH diet and commitment to daily physical activity for a minimum of 30 minutes discussed and encouraged, as a part of hypertension management. The importance of attaining a healthy weight is also discussed.  BP/Weight 06/29/2021 04/27/2021 03/01/2021 12/25/2020 12/08/2020 11/18/2020 11/11/1914  Systolic BP 606 004 599 774 142 - -  Diastolic BP 86 84 86 81 84 - -  Wt. (Lbs) 214 220 218 222 225.12 220 220  BMI 34.54 35.51 35.19 35.83 36.34 35.51 35.51       Allergic rhinitis uncontrpolled , prednisone x 5 days  Morbid obesity ([place]) Improved   Patient re-educated about  the importance of commitment to a  minimum of 150 minutes of exercise per week as able.  The importance of healthy food choices with portion control discussed, as well as eating regularly and within a 12 hour window most days. The need to choose "clean , green" food 50 to 75% of the time is discussed, as well as to make water the primary drink and set a goal of 64 ounces water daily.    Weight /BMI 06/29/2021 04/27/2021 03/01/2021  WEIGHT 214 lb 220 lb 218 lb  HEIGHT 5\' 6"  5\' 6"  5\' 6"   BMI 34.54 kg/m2 35.51 kg/m2 35.19 kg/m2      Prediabetes Patient educated about the importance of limiting  Carbohydrate intake , the need to commit to daily physical activity for a minimum of 30 minutes , and to commit weight loss. The fact that changes in all these areas will reduce or eliminate all together the development of diabetes  is stressed.   Diabetic Labs Latest Ref Rng & Units 04/26/2021 12/03/2020 03/11/2020 03/10/2020 03/10/2020  HbA1c 4.8 - 5.6 % 5.7(H) 6.2(H) - 5.9 5.9  Chol 100 - 199 mg/dL 162 175 161 - -  HDL >39 mg/dL 33(L) 33(L) 33(L) - -  Calc LDL 0 - 99 mg/dL 70 85 78 - -  Triglycerides 0 - 149 mg/dL 374(H) 350(H) 310(H) - -  Creatinine 0.57 - 1.00 mg/dL 0.90 1.10(H) 1.01(H) - -   BP/Weight 06/29/2021 04/27/2021 03/01/2021 12/25/2020 12/08/2020 11/18/2020 05/09/4627  Systolic BP 638 177 116 579 038 - -  Diastolic BP  86 84 86 81 84 - -  Wt. (Lbs) 214 220 218 222 225.12 220 220  BMI 34.54 35.51 35.19 35.83 36.34 35.51 35.51   No flowsheet data found.  Updated lab needed at/ before next visit.   Seasonal allergies Uncontrolled, short course of prednisone is prescribed  Hyperlipidemia LDL goal <100 Hyperlipidemia:Low fat diet discussed and encouraged.   Lipid Panel  Lab Results  Component Value Date   CHOL 162 04/26/2021   HDL 33 (L) 04/26/2021   LDLCALC 70 04/26/2021   LDLDIRECT 69 02/13/2009   TRIG 374 (H) 04/26/2021   CHOLHDL 4.9 (H) 04/26/2021     Updated lab needed at/ before next visit.   Urinary incontinence Controlled with medication 3 times weekly

## 2021-06-29 NOTE — Assessment & Plan Note (Signed)
Hyperlipidemia:Low fat diet discussed and encouraged.   Lipid Panel  Lab Results  Component Value Date   CHOL 162 04/26/2021   HDL 33 (L) 04/26/2021   LDLCALC 70 04/26/2021   LDLDIRECT 69 02/13/2009   TRIG 374 (H) 04/26/2021   CHOLHDL 4.9 (H) 04/26/2021     Updated lab needed at/ before next visit.

## 2021-06-29 NOTE — Assessment & Plan Note (Signed)
Patient educated about the importance of limiting  Carbohydrate intake , the need to commit to daily physical activity for a minimum of 30 minutes , and to commit weight loss. The fact that changes in all these areas will reduce or eliminate all together the development of diabetes is stressed.   Diabetic Labs Latest Ref Rng & Units 04/26/2021 12/03/2020 03/11/2020 03/10/2020 03/10/2020  HbA1c 4.8 - 5.6 % 5.7(H) 6.2(H) - 5.9 5.9  Chol 100 - 199 mg/dL 162 175 161 - -  HDL >39 mg/dL 33(L) 33(L) 33(L) - -  Calc LDL 0 - 99 mg/dL 70 85 78 - -  Triglycerides 0 - 149 mg/dL 374(H) 350(H) 310(H) - -  Creatinine 0.57 - 1.00 mg/dL 0.90 1.10(H) 1.01(H) - -   BP/Weight 06/29/2021 04/27/2021 03/01/2021 12/25/2020 12/08/2020 11/18/2020 2/95/7473  Systolic BP 403 709 643 838 184 - -  Diastolic BP 86 84 86 81 84 - -  Wt. (Lbs) 214 220 218 222 225.12 220 220  BMI 34.54 35.51 35.19 35.83 36.34 35.51 35.51   No flowsheet data found.  Updated lab needed at/ before next visit.

## 2021-06-29 NOTE — Assessment & Plan Note (Signed)
Controlled with medication 3 times weekly

## 2021-06-29 NOTE — Assessment & Plan Note (Signed)
Uncontrolled, inc spironolactone dose and reduce salt DASH diet and commitment to daily physical activity for a minimum of 30 minutes discussed and encouraged, as a part of hypertension management. The importance of attaining a healthy weight is also discussed.  BP/Weight 06/29/2021 04/27/2021 03/01/2021 12/25/2020 12/08/2020 11/18/2020 04/11/8336  Systolic BP 445 146 047 998 721 - -  Diastolic BP 86 84 86 81 84 - -  Wt. (Lbs) 214 220 218 222 225.12 220 220  BMI 34.54 35.51 35.19 35.83 36.34 35.51 35.51

## 2021-06-29 NOTE — Assessment & Plan Note (Signed)
Uncontrolled, short course of prednisone is prescribed

## 2021-06-29 NOTE — Assessment & Plan Note (Signed)
uncontrpolled , prednisone x 5 days

## 2021-06-29 NOTE — Patient Instructions (Signed)
F/U first week in January, call if you need me sooner  New for blood pressure is spironolactone 50 mg one daily , continue chlorthalidone 25 mg daily  Please get fasting lipid, cmp and eGFr last week in December  Congrats on weight loss, keep it up  Please reduce salt intake   It is important that you exercise regularly at least 30 minutes 5 times a week. If you develop chest pain, have severe difficulty breathing, or feel very tired, stop exercising immediately and seek medical attention   Think about what you will eat, plan ahead. Choose " clean, green, fresh or frozen" over canned, processed or packaged foods which are more sugary, salty and fatty. 70 to 75% of food eaten should be vegetables and fruit. Three meals at set times with snacks allowed between meals, but they must be fruit or vegetables. Aim to eat over a 12 hour period , example 7 am to 7 pm, and STOP after  your last meal of the day. Drink water,generally about 64 ounces per day, no other drink is as healthy. Fruit juice is best enjoyed in a healthy way, by EATING the fruit.  Thanks for choosing [facility], we consider it a privelige to serve you.

## 2021-07-16 ENCOUNTER — Other Ambulatory Visit: Payer: Self-pay | Admitting: Family Medicine

## 2021-07-22 ENCOUNTER — Other Ambulatory Visit: Payer: Self-pay | Admitting: Family Medicine

## 2021-07-29 ENCOUNTER — Other Ambulatory Visit: Payer: Self-pay | Admitting: Family Medicine

## 2021-08-12 DIAGNOSIS — I10 Essential (primary) hypertension: Secondary | ICD-10-CM | POA: Diagnosis not present

## 2021-08-13 LAB — LIPID PANEL
Chol/HDL Ratio: 4.1 ratio (ref 0.0–4.4)
Cholesterol, Total: 155 mg/dL (ref 100–199)
HDL: 38 mg/dL — ABNORMAL LOW (ref >39)
LDL Chol Calc (NIH): 70 mg/dL (ref 0–99)
Triglycerides: 293 mg/dL — ABNORMAL HIGH (ref 0–149)
VLDL Cholesterol Cal: 47 mg/dL — ABNORMAL HIGH (ref 5–40)

## 2021-08-13 LAB — CMP14+EGFR
ALT: 22 IU/L (ref 0–32)
AST: 19 IU/L (ref 0–40)
Albumin/Globulin Ratio: 2.3 — ABNORMAL HIGH (ref 1.2–2.2)
Albumin: 5 g/dL — ABNORMAL HIGH (ref 3.7–4.7)
Alkaline Phosphatase: 75 IU/L (ref 44–121)
BUN/Creatinine Ratio: 10 — ABNORMAL LOW (ref 12–28)
BUN: 11 mg/dL (ref 8–27)
Bilirubin Total: 0.5 mg/dL (ref 0.0–1.2)
CO2: 22 mmol/L (ref 20–29)
Calcium: 10.2 mg/dL (ref 8.7–10.3)
Chloride: 102 mmol/L (ref 96–106)
Creatinine, Ser: 1.07 mg/dL — ABNORMAL HIGH (ref 0.57–1.00)
Globulin, Total: 2.2 g/dL (ref 1.5–4.5)
Glucose: 111 mg/dL — ABNORMAL HIGH (ref 70–99)
Potassium: 4.4 mmol/L (ref 3.5–5.2)
Sodium: 138 mmol/L (ref 134–144)
Total Protein: 7.2 g/dL (ref 6.0–8.5)
eGFR: 56 mL/min/{1.73_m2} — ABNORMAL LOW (ref >59)

## 2021-08-16 NOTE — Progress Notes (Signed)
History of Present Illness: Sarah Coleman is a 71 y.o. year old female here for followup of dysuria  as well as LUTS.  She still takes Solifenacin and 2-3 night a week perivaginal estrogen cream.   She has had no real urinary issues over the past year.  She takes her Solifenacin on Monday Wednesday Fridays.  She has a good stream, feels like she empties well.  No constipation.  Somewhat of a dry mouth.  Past Medical History:  Diagnosis Date   Allergic rhinitis    Allergy    Anemia    Arthritis    GERD (gastroesophageal reflux disease) 2013   Hyperlipidemia    Hypertension    Metabolic syndrome X 2376   Morbid obesity ([place]) 2000   Obesity    Prediabetes 2010   Seizures ([place])    as child; unknown etiology.    Past Surgical History:  Procedure Laterality Date   BIOPSY  04/11/2017   Procedure: BIOPSY;  Surgeon: Danie Binder, MD;  [facility];  Service: Endoscopy;;  gastric bx's   COLONOSCOPY N/A 10/07/2016   Procedure: COLONOSCOPY;  Surgeon: Danie Binder, MD;  [facility];  Service: Endoscopy;  Laterality: N/A;  9:30 AM   COLONOSCOPY WITH PROPOFOL N/A 11/01/2016   Procedure: COLONOSCOPY WITH PROPOFOL;  Surgeon: Danie Binder, MD;  [facility];  Service: Endoscopy;  Laterality: N/A;  830    COLONOSCOPY WITH PROPOFOL N/A 01/23/2018   Procedure: COLONOSCOPY WITH PROPOFOL;  Surgeon: Danie Binder, MD;  [facility];  Service: Endoscopy;  Laterality: N/A;  2:00pm - pt knows to arrive at 10:15   ESOPHAGOGASTRODUODENOSCOPY (EGD) WITH PROPOFOL N/A 04/11/2017   Procedure: ESOPHAGOGASTRODUODENOSCOPY (EGD) WITH PROPOFOL;  Surgeon: Danie Binder, MD;  [facility];  Service: Endoscopy;  Laterality: N/A;  10:45am   GERD     PARTIAL HYSTERECTOMY  1987   secondary to ovarian cyst    POLYPECTOMY  11/01/2016   Procedure: POLYPECTOMY;  Surgeon: Danie Binder, MD;  [facility];  Service: Endoscopy;;  colon   POLYPECTOMY   01/23/2018   Procedure: POLYPECTOMY;  Surgeon: Danie Binder, MD;  [facility];  Service: Endoscopy;;  ascending colon   SAVORY DILATION N/A 04/11/2017   Procedure: SAVORY DILATION;  Surgeon: Danie Binder, MD;  [facility];  Service: Endoscopy;  Laterality: N/A;   TUBAL LIGATION  1975    Home Medications:  (Not in a hospital admission)   Allergies:  Allergies  Allergen Reactions   Banana    Cinnamon Swelling   Ibuprofen Swelling   Penicillins Nausea And Vomiting    Has patient had a PCN reaction causing immediate rash, facial/tongue/throat swelling, SOB or lightheadedness with hypotension:No Has patient had a PCN reaction causing severe rash involving mucus membranes or skin necrosis:No Has patient had a PCN reaction that required hospitalization:No Has patient had a PCN reaction occurring within the last 10 years:No If all of the above answers are "NO", then may proceed with Cephalosporin use.    Sweet Potato Swelling    Family History  Problem Relation Age of Onset   Heart attack Mother    Heart disease Mother 76       massive heart attack   Prostate cancer Father    Colon cancer Father 38   Hypertension Sister    Ovarian cancer Sister    Cancer Brother 23  oral    Hypertension Brother    Heart disease Brother     Social History:  reports that she quit smoking about 47 years ago. Her smoking use included cigarettes. She has a 3.00 pack-year smoking history. She has never used smokeless tobacco. She reports that she does not drink alcohol and does not use drugs.  ROS: A complete review of systems was performed.  All systems are negative except for pertinent findings as noted.  Physical Exam:  Vital signs in last 24 hours: @VSRANGES @ General:  Alert and oriented, No acute distress HEENT: Normocephalic, atraumatic Neck: No JVD or lymphadenopathy Cardiovascular: Regular rate  Lungs: Normal inspiratory/expiratory  excursion Neurologic: Grossly intact  I have reviewed prior pt notes  I have reviewed urinalysis results   Impression/Assessment:  1.  Atrophic vaginal changes with dysuria, doing better on estrogen cream  2.  Overactive bladder, on Solifenacin every other day with adequate symptomatic response  Plan:  I will see her back in 1 year to recheck  Jorja Loa 08/16/2021, 9:23 PM  [name]. Orlin Kann MD

## 2021-08-17 ENCOUNTER — Other Ambulatory Visit: Payer: Self-pay

## 2021-08-17 ENCOUNTER — Ambulatory Visit (INDEPENDENT_AMBULATORY_CARE_PROVIDER_SITE_OTHER): Payer: PPO | Admitting: Urology

## 2021-08-17 ENCOUNTER — Encounter: Payer: Self-pay | Admitting: Urology

## 2021-08-17 VITALS — BP 149/80 | HR 84

## 2021-08-17 DIAGNOSIS — R300 Dysuria: Secondary | ICD-10-CM | POA: Diagnosis not present

## 2021-08-17 DIAGNOSIS — R3915 Urgency of urination: Secondary | ICD-10-CM

## 2021-08-17 DIAGNOSIS — N952 Postmenopausal atrophic vaginitis: Secondary | ICD-10-CM

## 2021-08-17 LAB — URINALYSIS, ROUTINE W REFLEX MICROSCOPIC
Bilirubin, UA: NEGATIVE
Glucose, UA: NEGATIVE
Ketones, UA: NEGATIVE
Nitrite, UA: NEGATIVE
Protein,UA: NEGATIVE
RBC, UA: NEGATIVE
Specific Gravity, UA: 1.015 (ref 1.005–1.030)
Urobilinogen, Ur: 0.2 mg/dL (ref 0.2–1.0)
pH, UA: 7.5 (ref 5.0–7.5)

## 2021-08-17 LAB — MICROSCOPIC EXAMINATION
RBC, Urine: NONE SEEN /hpf (ref 0–2)
Renal Epithel, UA: NONE SEEN /hpf

## 2021-08-17 MED ORDER — ESTRADIOL 0.1 MG/GM VA CREA: TOPICAL_CREAM | VAGINAL | 3 refills | Status: DC

## 2021-08-17 NOTE — Progress Notes (Signed)

## 2021-08-18 ENCOUNTER — Other Ambulatory Visit: Payer: Self-pay | Admitting: Family Medicine

## 2021-08-27 ENCOUNTER — Ambulatory Visit (INDEPENDENT_AMBULATORY_CARE_PROVIDER_SITE_OTHER): Payer: PPO | Admitting: Family Medicine

## 2021-08-27 ENCOUNTER — Other Ambulatory Visit: Payer: Self-pay

## 2021-08-27 ENCOUNTER — Encounter: Payer: Self-pay | Admitting: Family Medicine

## 2021-08-27 VITALS — BP 142/80 | HR 85 | Resp 16 | Ht 66.0 in | Wt 214.1 lb

## 2021-08-27 DIAGNOSIS — E785 Hyperlipidemia, unspecified: Secondary | ICD-10-CM

## 2021-08-27 DIAGNOSIS — N393 Stress incontinence (female) (male): Secondary | ICD-10-CM | POA: Diagnosis not present

## 2021-08-27 DIAGNOSIS — I10 Essential (primary) hypertension: Secondary | ICD-10-CM

## 2021-08-27 DIAGNOSIS — R7303 Prediabetes: Secondary | ICD-10-CM

## 2021-08-27 DIAGNOSIS — J309 Allergic rhinitis, unspecified: Secondary | ICD-10-CM | POA: Diagnosis not present

## 2021-08-27 MED ORDER — MONTELUKAST SODIUM 10 MG PO TABS: 10.0000 mg | ORAL_TABLET | Freq: Every day | ORAL | 1 refills | Status: DC

## 2021-08-27 MED ORDER — POTASSIUM CHLORIDE CRYS ER 20 MEQ PO TBCR: EXTENDED_RELEASE_TABLET | ORAL | 0 refills | Status: DC

## 2021-08-27 MED ORDER — FLUTICASONE PROPIONATE 50 MCG/ACT NA SUSP: NASAL | 1 refills | Status: DC

## 2021-08-27 MED ORDER — CHLORTHALIDONE 25 MG PO TABS: 25.0000 mg | ORAL_TABLET | Freq: Every day | ORAL | 3 refills | Status: DC

## 2021-08-27 MED ORDER — LOVASTATIN 40 MG PO TABS: 40.0000 mg | ORAL_TABLET | Freq: Every day | ORAL | 1 refills | Status: DC

## 2021-08-27 MED ORDER — SPIRONOLACTONE 50 MG PO TABS: ORAL_TABLET | ORAL | 1 refills | Status: DC

## 2021-08-27 NOTE — Patient Instructions (Signed)
F/U in 3 months, call if you need me sooner  Increase spironolactone 50 mg to one and a half daily, blood pressure too high  Fasting lipid, cmp and EGFR and hBA1C 5 days before next appointment  Please stop bread, pasta and sugar, increase vegetables, and water  It is important that you exercise regularly at least 30 minutes 5 times a week. If you develop chest pain, have severe difficulty breathing, or feel very tired, stop exercising immediately and seek medical attention   Think about what you will eat, plan ahead. Choose " clean, green, fresh or frozen" over canned, processed or packaged foods which are more sugary, salty and fatty. 70 to 75% of food eaten should be vegetables and fruit. Three meals at set times with snacks allowed between meals, but they must be fruit or vegetables. Aim to eat over a 12 hour period , example 7 am to 7 pm, and STOP after  your last meal of the day. Drink water,generally about 64 ounces per day, no other drink is as healthy. Fruit juice is best enjoyed in a healthy way, by EATING the fruit.  Thanks for choosing [facility], we consider it a privelige to serve you.

## 2021-08-30 ENCOUNTER — Encounter: Payer: Self-pay | Admitting: Family Medicine

## 2021-08-30 NOTE — Assessment & Plan Note (Signed)
Improved  Patient re-educated about  the importance of commitment to a  minimum of 150 minutes of exercise per week as able.  The importance of healthy food choices with portion control discussed, as well as eating regularly and within a 12 hour window most days. The need to choose "clean , green" food 50 to 75% of the time is discussed, as well as to make water the primary drink and set a goal of 64 ounces water daily.    Weight /BMI 08/27/2021 06/29/2021 04/27/2021  WEIGHT 214 lb 1.9 oz 214 lb 220 lb  HEIGHT 5\' 6"  5\' 6"  5\' 6"   BMI 34.56 kg/m2 34.54 kg/m2 35.51 kg/m2

## 2021-08-30 NOTE — Assessment & Plan Note (Signed)
Uncontrolled and not at goal Inc spironolactone dose DASH diet and commitment to daily physical activity for a minimum of 30 minutes discussed and encouraged, as a part of hypertension management. The importance of attaining a healthy weight is also discussed.  BP/Weight 08/27/2021 08/17/2021 06/29/2021 04/27/2021 03/01/2021 12/25/2020 01/29/8371  Systolic BP 902 111 552 080 223 361 224  Diastolic BP 80 80 86 84 86 81 84  Wt. (Lbs) 214.12 - 214 220 218 222 225.12  BMI 34.56 - 34.54 35.51 35.19 35.83 36.34

## 2021-08-30 NOTE — Assessment & Plan Note (Signed)
Patient educated about the importance of limiting  Carbohydrate intake , the need to commit to daily physical activity for a minimum of 30 minutes , and to commit weight loss. The fact that changes in all these areas will reduce or eliminate all together the development of diabetes is stressed.  Improving Updated lab needed at/ before next visit.   Diabetic Labs Latest Ref Rng & Units 08/12/2021 04/26/2021 12/03/2020 03/11/2020 03/10/2020  HbA1c 4.8 - 5.6 % - 5.7(H) 6.2(H) - 5.9  Chol 100 - 199 mg/dL 155 162 175 161 -  HDL >39 mg/dL 38(L) 33(L) 33(L) 33(L) -  Calc LDL 0 - 99 mg/dL 70 70 85 78 -  Triglycerides 0 - 149 mg/dL 293(H) 374(H) 350(H) 310(H) -  Creatinine 0.57 - 1.00 mg/dL 1.07(H) 0.90 1.10(H) 1.01(H) -   BP/Weight 08/27/2021 08/17/2021 06/29/2021 04/27/2021 03/01/2021 12/25/2020 3/70/4888  Systolic BP 916 945 038 882 800 349 179  Diastolic BP 80 80 86 84 86 81 84  Wt. (Lbs) 214.12 - 214 220 218 222 225.12  BMI 34.56 - 34.54 35.51 35.19 35.83 36.34   No flowsheet data found.

## 2021-08-30 NOTE — Assessment & Plan Note (Signed)
Controlled, no change in medication  

## 2021-08-30 NOTE — Progress Notes (Signed)
Sarah Coleman     MRN: [id number]      DOB: 1951/07/19   HPI Sarah Coleman is here for follow up and re-evaluation of chronic medical conditions, medication management and review of any available recent lab and radiology data.  Preventive health is updated, specifically  Cancer screening and Immunization.   Questions or concerns regarding consultations or procedures which the PT has had in the interim are  addressed. The PT denies any adverse reactions to current medications since the last visit.  There are no new concerns.  There are no specific complaints   ROS Denies recent fever or chills. Denies sinus pressure, nasal congestion, ear pain or sore throat. Denies chest congestion, productive cough or wheezing. Denies chest pains, palpitations and leg swelling Denies abdominal pain, nausea, vomiting,diarrhea or constipation.   Denies dysuria, frequency, hesitancy or uncontrolled  incontinence. Denies joint pain, swelling and limitation in mobility. Denies headaches, seizures, numbness, or tingling. Denies depression, anxiety or insomnia. Denies skin break down or rash.   PE  BP (!) 142/80    Pulse 85    Resp 16    Ht 5\' 6"  (1.676 m)    Wt 214 lb 1.9 oz (97.1 kg)    SpO2 94%    BMI 34.56 kg/m   Patient alert and oriented and in no cardiopulmonary distress.  HEENT: No facial asymmetry, EOMI,     Neck supple .  Chest: Clear to auscultation bilaterally.  CVS: S1, S2 no murmurs, no S3.Regular rate.  ABD: Soft non tender.   Ext: No edema  MS: Adequate ROM spine, shoulders, hips and knees.  Skin: Intact, no ulcerations or rash noted.  Psych: Good eye contact, normal affect. Memory intact not anxious or depressed appearing.  CNS: CN 2-12 intact, power,  normal throughout.no focal deficits noted.   Assessment & Plan  Essential hypertension Uncontrolled and not at goal Inc spironolactone dose DASH diet and commitment to daily physical activity for a minimum of 30 minutes  discussed and encouraged, as a part of hypertension management. The importance of attaining a healthy weight is also discussed.  BP/Weight 08/27/2021 08/17/2021 06/29/2021 04/27/2021 03/01/2021 12/25/2020 2/37/6283  Systolic BP 151 761 607 371 062 694 854  Diastolic BP 80 80 86 84 86 81 84  Wt. (Lbs) 214.12 - 214 220 218 222 225.12  BMI 34.56 - 34.54 35.51 35.19 35.83 36.34       Prediabetes Patient educated about the importance of limiting  Carbohydrate intake , the need to commit to daily physical activity for a minimum of 30 minutes , and to commit weight loss. The fact that changes in all these areas will reduce or eliminate all together the development of diabetes is stressed.  Improving Updated lab needed at/ before next visit.   Diabetic Labs Latest Ref Rng & Units 08/12/2021 04/26/2021 12/03/2020 03/11/2020 03/10/2020  HbA1c 4.8 - 5.6 % - 5.7(H) 6.2(H) - 5.9  Chol 100 - 199 mg/dL 155 162 175 161 -  HDL >39 mg/dL 38(L) 33(L) 33(L) 33(L) -  Calc LDL 0 - 99 mg/dL 70 70 85 78 -  Triglycerides 0 - 149 mg/dL 293(H) 374(H) 350(H) 310(H) -  Creatinine 0.57 - 1.00 mg/dL 1.07(H) 0.90 1.10(H) 1.01(H) -   BP/Weight 08/27/2021 08/17/2021 06/29/2021 04/27/2021 03/01/2021 12/25/2020 02/08/349  Systolic BP 093 818 299 371 696 789 381  Diastolic BP 80 80 86 84 86 81 84  Wt. (Lbs) 214.12 - 214 220 218 222 225.12  BMI 34.56 -  34.54 35.51 35.19 35.83 36.34   No flowsheet data found.    Allergic rhinitis Controlled, no change in medication   Morbid obesity ([place]) Improved  Patient re-educated about  the importance of commitment to a  minimum of 150 minutes of exercise per week as able.  The importance of healthy food choices with portion control discussed, as well as eating regularly and within a 12 hour window most days. The need to choose "clean , green" food 50 to 75% of the time is discussed, as well as to make water the primary drink and set a goal of 64 ounces water daily.    Weight /BMI  08/27/2021 06/29/2021 04/27/2021  WEIGHT 214 lb 1.9 oz 214 lb 220 lb  HEIGHT 5\' 6"  5\' 6"  5\' 6"   BMI 34.56 kg/m2 34.54 kg/m2 35.51 kg/m2      Urinary incontinence Controlled, no change in medication

## 2021-09-09 ENCOUNTER — Other Ambulatory Visit: Payer: Self-pay

## 2021-09-09 ENCOUNTER — Ambulatory Visit (INDEPENDENT_AMBULATORY_CARE_PROVIDER_SITE_OTHER): Payer: PPO | Admitting: *Deleted

## 2021-09-09 VITALS — Ht 66.0 in | Wt 214.0 lb

## 2021-09-09 DIAGNOSIS — Z86010 Personal history of colonic polyps: Secondary | ICD-10-CM

## 2021-09-09 NOTE — Progress Notes (Signed)
Ok to schedule. Asa 3.

## 2021-09-09 NOTE — Progress Notes (Addendum)
Gastroenterology Pre-Procedure Review  Request Date: 09/09/2021 Requesting Physician: 3 year recall, Last TCS done 01/23/2018 by Dr. Oneida Alar, tubular adenoma (x3), family hx of colon cancer (father)  PATIENT REVIEW QUESTIONS: The patient responded to the following health history questions as indicated:    1. Diabetes Melitis: no 2. Joint replacements in the past 12 months: no 3. Major health problems in the past 3 months: no 4. Has an artificial valve or MVP: no 5. Has a defibrillator: no 6. Has been advised in past to take antibiotics in advance of a procedure like teeth cleaning: no 7. Family history of colon cancer: yes, father: age unknown  20. Alcohol Use: no 9. Illicit drug Use: no 10. History of sleep apnea: no  11. History of coronary artery or other vascular stents placed within the last 12 months: no 12. History of any prior anesthesia complications: yes, hard to wake 13. Body mass index is 34.54 kg/m.    MEDICATIONS & ALLERGIES:    Patient reports the following regarding taking any blood thinners:   Plavix? no Aspirin? Yes, 81 mg daily Coumadin? no Brilinta? no Xarelto? no Eliquis? no Pradaxa? no Savaysa? no Effient? no  Patient confirms/reports the following medications:  Current Outpatient Medications  Medication Sig Dispense Refill   acetaminophen (TYLENOL) 500 MG tablet Take 1,000 mg by mouth as needed for moderate pain or headache.     aspirin EC 81 MG tablet Take 81 mg by mouth at bedtime.      azelastine (ASTELIN) 0.1 % nasal spray Use 2 sprays in each nostril every 12 hours for 1 week; After that, you may use 1 spray in each nostril twice a day as needed for allergies/congestion 30 mL 12   Calcium Carbonate Antacid (EQ ANTACID PO) Take by mouth as needed.     CALCIUM-MAGNESIUM-ZINC PO Take 1 tablet by mouth 2 (two) times daily.      carboxymethylcellulose (REFRESH PLUS) 0.5 % SOLN Place 1-2 drops into both eyes 3 (three) times daily as needed (for dry eyes).      chlorpheniramine (CHLOR-TRIMETON) 4 MG tablet Take one tablet once daily as needed, for excessive nasal drainage 14 tablet 1   chlorthalidone (HYGROTON) 25 MG tablet Take 1 tablet (25 mg total) by mouth daily. 90 tablet 3   estradiol (ESTRACE) 0.1 MG/GM vaginal cream USE 1 APPLICATORFUL VAGINALLY EVERY OTHER DAY AS NEEDED FOR  VAGINAL  DISCOMFORT 43 g 3   ezetimibe (ZETIA) 10 MG tablet Take 1 tablet (10 mg total) by mouth daily. 90 tablet 3   fluticasone (FLONASE) 50 MCG/ACT nasal spray Use 2 spray(s) in each nostril once daily 16 g 1   loratadine (CLARITIN) 10 MG tablet Take two tablets once daily for allergies 180 tablet 3   lovastatin (MEVACOR) 40 MG tablet Take 1 tablet (40 mg total) by mouth at bedtime. 90 tablet 1   meclizine (ANTIVERT) 12.5 MG tablet Take 1 tablet (12.5 mg total) by mouth 3 (three) times daily as needed for dizziness. (Patient taking differently: Take 12.5 mg by mouth as needed for dizziness.) 30 tablet 1   montelukast (SINGULAIR) 10 MG tablet Take 1 tablet (10 mg total) by mouth at bedtime. 90 tablet 1   Omega-3 300 MG CAPS Take 1,200 mg by mouth daily.     potassium chloride SA (KLOR-CON M) 20 MEQ tablet TAKE 2 TABLETS BY MOUTH THREE TIMES DAILY 540 tablet 0   solifenacin (VESICARE) 10 MG tablet Take 1 tablet (10 mg total) by mouth  daily. (Patient taking differently: Take 10 mg by mouth 3 (three) times a week.) 90 tablet 3   spironolactone (ALDACTONE) 50 MG tablet Take one and a half tablets by mouth once daily 135 tablet 1   No current facility-administered medications for this visit.    Patient confirms/reports the following allergies:  Allergies  Allergen Reactions   Banana    Cinnamon Swelling   Ibuprofen Swelling   Penicillins Nausea And Vomiting    Has patient had a PCN reaction causing immediate rash, facial/tongue/throat swelling, SOB or lightheadedness with hypotension:No Has patient had a PCN reaction causing severe rash involving mucus membranes or  skin necrosis:No Has patient had a PCN reaction that required hospitalization:No Has patient had a PCN reaction occurring within the last 10 years:No If all of the above answers are "NO", then may proceed with Cephalosporin use.    Sweet Potato Swelling    No orders of the defined types were placed in this encounter.   AUTHORIZATION INFORMATION Primary Insurance: [employer],  ID #: [id number],  Group #: [id number] Pre-Cert / Auth required: No, not required  SCHEDULE INFORMATION: Procedure has been scheduled as follows:  Date: 09/27/2021, Time: 8:30 Location: [facility] with Dr. Abbey Chatters  This Gastroenterology Pre-Precedure Review Form is being routed to the following provider(s): Neil Crouch, PA-C

## 2021-09-14 ENCOUNTER — Encounter: Payer: Self-pay | Admitting: *Deleted

## 2021-09-14 MED ORDER — PEG 3350-KCL-NA BICARB-NACL 420 G PO SOLR: 4000.0000 mL | Freq: Once | ORAL | 0 refills | Status: AC

## 2021-09-14 NOTE — Addendum Note (Signed)
Addended by: Metro Kung on: 09/14/2021 08:55 AM   Modules accepted: Orders

## 2021-09-14 NOTE — Progress Notes (Signed)
Spoke with pt.  Informed her of Pre-op appointment 09/23/2021 at 1:45.  Scheduled procedure for 09/27/2021.  Pt made aware that they will inform her of procedure time when she goes for Pre-op appointment.  Reviewed prep instructions with pt by telephone.  Pt made aware that I am mailing them out.  Confirmed mailing address with pt.

## 2021-09-21 NOTE — Patient Instructions (Signed)
°   Your procedure is scheduled on: 09/27/2021  Report to [place] Entrance at   6:45  AM.  Call this number if you have problems the morning of surgery: [phone]   Remember:              Follow Directions on the letter you received from Your Physician's office regarding the Bowel Prep              No Smoking the day of Procedure :   Take these medicines the morning of surgery with A SIP OF WATER: Meclizine and flonase   Do not wear jewelry, make-up or nail polish.    Do not bring valuables to the hospital.  Contacts, dentures or bridgework may not be worn into surgery.  .   Patients discharged the day of surgery will not be allowed to drive home.     Colonoscopy, Adult, Care After This sheet gives you information about how to care for yourself after your procedure. Your health care provider may also give you more specific instructions. If you have problems or questions, contact your health care provider. What can I expect after the procedure? After the procedure, it is common to have: A small amount of blood in your stool for 24 hours after the procedure. Some gas. Mild abdominal cramping or bloating.  Follow these instructions at home: General instructions  For the first 24 hours after the procedure: Do not drive or use machinery. Do not sign important documents. Do not drink alcohol. Do your regular daily activities at a slower pace than normal. Eat soft, easy-to-digest foods. Rest often. Take over-the-counter or prescription medicines only as told by your health care provider. It is up to you to get the results of your procedure. Ask your health care provider, or the department performing the procedure, when your results will be ready. Relieving cramping and bloating Try walking around when you have cramps or feel bloated. Apply heat to your abdomen as told by your health care provider. Use a heat source that your health care provider recommends, such as a  moist heat pack or a heating pad. Place a towel between your skin and the heat source. Leave the heat on for 20-30 minutes. Remove the heat if your skin turns bright red. This is especially important if you are unable to feel pain, heat, or cold. You may have a greater risk of getting burned. Eating and drinking Drink enough fluid to keep your urine clear or pale yellow. Resume your normal diet as instructed by your health care provider. Avoid heavy or fried foods that are hard to digest. Avoid drinking alcohol for as long as instructed by your health care provider. Contact a health care provider if: You have blood in your stool 2-3 days after the procedure. Get help right away if: You have more than a small spotting of blood in your stool. You pass large blood clots in your stool. Your abdomen is swollen. You have nausea or vomiting. You have a fever. You have increasing abdominal pain that is not relieved with medicine. This information is not intended to replace advice given to you by your health care provider. Make sure you discuss any questions you have with your health care provider. Document Released: 03/15/2004 Document Revised: 04/25/2016 Document Reviewed: 10/13/2015 Elsevier Interactive Patient Education  [employer].

## 2021-09-23 ENCOUNTER — Encounter (HOSPITAL_COMMUNITY): Payer: Self-pay

## 2021-09-23 ENCOUNTER — Encounter (HOSPITAL_COMMUNITY)
Admission: RE | Admit: 2021-09-23 | Discharge: 2021-09-23 | Disposition: A | Discharge status: Home / Self Care | Payer: PPO | Source: Ambulatory Visit | Attending: Internal Medicine | Admitting: Internal Medicine

## 2021-09-23 ENCOUNTER — Other Ambulatory Visit: Payer: Self-pay

## 2021-09-23 VITALS — HR 82 | Temp 97.8°F | Resp 18 | Ht 66.0 in | Wt 214.0 lb

## 2021-09-23 DIAGNOSIS — Z01818 Encounter for other preprocedural examination: Secondary | ICD-10-CM | POA: Insufficient documentation

## 2021-09-23 DIAGNOSIS — Z79899 Other long term (current) drug therapy: Secondary | ICD-10-CM | POA: Insufficient documentation

## 2021-09-23 HISTORY — DX: Dizziness and giddiness: R42

## 2021-09-23 LAB — BASIC METABOLIC PANEL
Anion gap: 8 (ref 5–15)
BUN: 20 mg/dL (ref 8–23)
CO2: 23 mmol/L (ref 22–32)
Calcium: 9.2 mg/dL (ref 8.9–10.3)
Chloride: 98 mmol/L (ref 98–111)
Creatinine, Ser: 1.21 mg/dL — ABNORMAL HIGH (ref 0.44–1.00)
GFR, Estimated: 48 mL/min — ABNORMAL LOW (ref >60)
Glucose, Bld: 91 mg/dL (ref 70–99)
Potassium: 4.4 mmol/L (ref 3.5–5.1)
Sodium: 129 mmol/L — ABNORMAL LOW (ref 135–145)

## 2021-09-27 ENCOUNTER — Other Ambulatory Visit: Payer: Self-pay

## 2021-09-27 ENCOUNTER — Ambulatory Visit (HOSPITAL_COMMUNITY)
Admission: RE | Admit: 2021-09-27 | Discharge: 2021-09-27 | Disposition: A | Discharge status: Home / Self Care | Payer: PPO | Attending: Internal Medicine | Admitting: Internal Medicine

## 2021-09-27 ENCOUNTER — Encounter (HOSPITAL_COMMUNITY): Payer: Self-pay

## 2021-09-27 ENCOUNTER — Ambulatory Visit (HOSPITAL_BASED_OUTPATIENT_CLINIC_OR_DEPARTMENT_OTHER): Payer: PPO | Admitting: Anesthesiology

## 2021-09-27 ENCOUNTER — Ambulatory Visit (HOSPITAL_COMMUNITY): Payer: PPO | Admitting: Anesthesiology

## 2021-09-27 ENCOUNTER — Encounter (HOSPITAL_COMMUNITY)
Admission: RE | Disposition: A | Discharge status: Home / Self Care | Payer: Self-pay | Source: Home / Self Care | Attending: Internal Medicine

## 2021-09-27 DIAGNOSIS — Z87891 Personal history of nicotine dependence: Secondary | ICD-10-CM | POA: Insufficient documentation

## 2021-09-27 DIAGNOSIS — G40909 Epilepsy, unspecified, not intractable, without status epilepticus: Secondary | ICD-10-CM | POA: Diagnosis not present

## 2021-09-27 DIAGNOSIS — Z86010 Personal history of colonic polyps: Secondary | ICD-10-CM | POA: Diagnosis not present

## 2021-09-27 DIAGNOSIS — Z79899 Other long term (current) drug therapy: Secondary | ICD-10-CM | POA: Insufficient documentation

## 2021-09-27 DIAGNOSIS — Z1211 Encounter for screening for malignant neoplasm of colon: Secondary | ICD-10-CM | POA: Diagnosis not present

## 2021-09-27 DIAGNOSIS — K648 Other hemorrhoids: Secondary | ICD-10-CM

## 2021-09-27 DIAGNOSIS — K5730 Diverticulosis of large intestine without perforation or abscess without bleeding: Secondary | ICD-10-CM | POA: Insufficient documentation

## 2021-09-27 DIAGNOSIS — Z6834 Body mass index (BMI) 34.0-34.9, adult: Secondary | ICD-10-CM | POA: Diagnosis not present

## 2021-09-27 DIAGNOSIS — I10 Essential (primary) hypertension: Secondary | ICD-10-CM | POA: Insufficient documentation

## 2021-09-27 DIAGNOSIS — Z800 Family history of malignant neoplasm of digestive organs: Secondary | ICD-10-CM

## 2021-09-27 DIAGNOSIS — E785 Hyperlipidemia, unspecified: Secondary | ICD-10-CM | POA: Insufficient documentation

## 2021-09-27 DIAGNOSIS — K219 Gastro-esophageal reflux disease without esophagitis: Secondary | ICD-10-CM | POA: Insufficient documentation

## 2021-09-27 HISTORY — PX: COLONOSCOPY WITH PROPOFOL: SHX5780

## 2021-09-27 SURGERY — COLONOSCOPY WITH PROPOFOL
Anesthesia: General

## 2021-09-27 MED ORDER — LACTATED RINGERS IV SOLN: INTRAVENOUS | Status: DC | PRN

## 2021-09-27 MED ORDER — PROPOFOL 10 MG/ML IV BOLUS
INTRAVENOUS | Status: DC | PRN
Start: 2021-09-27 — End: 2021-09-27
  Administered 2021-09-27 (×2): 50 mg via INTRAVENOUS
  Administered 2021-09-27: 100 mg via INTRAVENOUS

## 2021-09-27 MED ORDER — LACTATED RINGERS IV SOLN: INTRAVENOUS | Status: DC

## 2021-09-27 MED ORDER — LIDOCAINE HCL (CARDIAC) PF 100 MG/5ML IV SOSY
PREFILLED_SYRINGE | INTRAVENOUS | Status: DC | PRN
  Administered 2021-09-27: 50 mg via INTRAVENOUS

## 2021-09-27 NOTE — Anesthesia Postprocedure Evaluation (Signed)
Anesthesia Post Note  Patient: Sarah Coleman  Procedure(s) Performed: COLONOSCOPY WITH PROPOFOL  Patient location during evaluation: Phase II Anesthesia Type: General Level of consciousness: awake Pain management: pain level controlled Vital Signs Assessment: post-procedure vital signs reviewed and stable Respiratory status: spontaneous breathing and respiratory function stable Cardiovascular status: blood pressure returned to baseline and stable Postop Assessment: no headache and no apparent nausea or vomiting Anesthetic complications: no Comments: Late entry   No notable events documented.   Last Vitals:  Vitals:   09/27/21 0744 09/27/21 0908  BP: 126/75 115/72  Pulse: 85   Resp: 17 17  Temp:  36.8 C  SpO2: 96% 98%    Last Pain:  Vitals:   09/27/21 0908  TempSrc: Oral  PainSc: 0-No pain                 Louann Sjogren

## 2021-09-27 NOTE — Anesthesia Preprocedure Evaluation (Signed)
Anesthesia Evaluation  Patient identified by MRN, date of birth, ID band Patient awake    Reviewed: Allergy & Precautions, H&P , NPO status , Patient's Chart, lab work & pertinent test results, reviewed documented beta blocker date and time   Airway Mallampati: II  TM Distance: >3 FB Neck ROM: full    Dental no notable dental hx.    Pulmonary neg pulmonary ROS, former smoker,    Pulmonary exam normal breath sounds clear to auscultation       Cardiovascular Exercise Tolerance: Good hypertension, negative cardio ROS   Rhythm:regular Rate:Normal     Neuro/Psych Seizures -,   Neuromuscular disease negative psych ROS   GI/Hepatic Neg liver ROS, GERD  Medicated,  Endo/Other  negative endocrine ROS  Renal/GU negative Renal ROS  negative genitourinary   Musculoskeletal   Abdominal   Peds  Hematology  (+) Blood dyscrasia, anemia ,   Anesthesia Other Findings   Reproductive/Obstetrics negative OB ROS                             Anesthesia Physical Anesthesia Plan  ASA: 3  Anesthesia Plan: General   Post-op Pain Management:    Induction:   PONV Risk Score and Plan: Propofol infusion  Airway Management Planned:   Additional Equipment:   Intra-op Plan:   Post-operative Plan:   Informed Consent: I have reviewed the patients History and Physical, chart, labs and discussed the procedure including the risks, benefits and alternatives for the proposed anesthesia with the patient or authorized representative who has indicated his/her understanding and acceptance.     Dental Advisory Given  Plan Discussed with: CRNA  Anesthesia Plan Comments:         Anesthesia Quick Evaluation

## 2021-09-27 NOTE — Op Note (Signed)
[facility] Patient Name: Sarah Coleman Procedure Date: 09/27/2021 8:40 AM MRN: [id number] Date of Birth: 12-Aug-1951 Attending MD: Elon Alas. Abbey Chatters DO CSN: [id number] Age: 71 Admit Type: Outpatient Procedure:                Colonoscopy Indications:              Colon cancer screening in patient at increased                            risk: Colorectal cancer in father, Surveillance:                            Personal history of adenomatous polyps on last                            colonoscopy > 3 years ago Providers:                Elon Alas. Abbey Chatters, DO, [place] [facility], [place]                            Risa Grill, Technician Referring MD:              Medicines:                See the Anesthesia note for documentation of the                            administered medications Complications:            No immediate complications. Estimated Blood Loss:     Estimated blood loss: none. Procedure:                Pre-Anesthesia Assessment:                           - The anesthesia plan was to use monitored                            anesthesia care (MAC).                           After obtaining informed consent, the colonoscope                            was passed under direct vision. Throughout the                            procedure, the patient's blood pressure, pulse, and                            oxygen saturations were monitored continuously. The                            [id number]-[id number] ([id number]) scope was introduced through                            the anus and advanced to the the cecum,  identified                            by appendiceal orifice and ileocecal valve. The                            colonoscopy was performed without difficulty. The                            patient tolerated the procedure well. The quality                            of the bowel preparation was evaluated using the                            BBPS [facility] Bowel Preparation Scale) with  scores                            of: Right Colon = 3, Transverse Colon = 3 and Left                            Colon = 3 (entire mucosa seen well with no residual                            staining, small fragments of stool or opaque                            liquid). The total BBPS score equals 9. Scope In: 8:51:47 AM Scope Out: 9:04:37 AM Scope Withdrawal Time: 0 hours 9 minutes 12 seconds  Total Procedure Duration: 0 hours 12 minutes 50 seconds  Findings:      The perianal and digital rectal examinations were normal.      Non-bleeding internal hemorrhoids were found during endoscopy.      Multiple small and large-mouthed diverticula were found in the sigmoid       colon.      The exam was otherwise without abnormality on direct and retroflexion       views. Impression:               - Non-bleeding internal hemorrhoids.                           - Diverticulosis in the sigmoid colon.                           - The examination was otherwise normal on direct                            and retroflexion views.                           - No specimens collected. Moderate Sedation:      Per Anesthesia Care Recommendation:           - Patient has a contact number available for  emergencies. The signs and symptoms of potential                            delayed complications were discussed with the                            patient. Return to normal activities tomorrow.                            Written discharge instructions were provided to the                            patient.                           - Resume previous diet.                           - Continue present medications.                           - Repeat colonoscopy in 5 years for high risk                            screening purposes (family history of colon cancer                            in father).                           - Return to GI clinic PRN. Procedure Code(s):        ---  Professional ---                           S1779, Colorectal cancer screening; colonoscopy on                            individual at high risk Diagnosis Code(s):        --- Professional ---                           Z80.0, Family history of malignant neoplasm of                            digestive organs                           Z86.010, Personal history of colonic polyps                           K64.8, Other hemorrhoids                           K57.30, Diverticulosis of large intestine without                            perforation or abscess without bleeding  CPT copyright 2019 American Medical Association. All rights reserved. The codes documented in this report are preliminary and upon coder review may  be revised to meet current compliance requirements. Elon Alas. Abbey Chatters, DO [place] Abbey Chatters, DO 09/27/2021 9:09:53 AM This report has been signed electronically. Number of Addenda: 0

## 2021-09-27 NOTE — Transfer of Care (Signed)
Immediate Anesthesia Transfer of Care Note  Patient: Sarah Coleman  Procedure(s) Performed: COLONOSCOPY WITH PROPOFOL  Patient Location: Short Stay  Anesthesia Type:General  Level of Consciousness: drowsy  Airway & Oxygen Therapy: Patient Spontanous Breathing  Post-op Assessment: Report given to RN and Post -op Vital signs reviewed and stable  Post vital signs: Reviewed and stable  Last Vitals:  Vitals Value Taken Time  BP    Temp    Pulse    Resp    SpO2      Last Pain:  Vitals:   09/27/21 0849  TempSrc:   PainSc: 0-No pain         Complications: No notable events documented.

## 2021-09-27 NOTE — H&P (Signed)
Primary Care Physician:  Fayrene Helper, MD Primary Gastroenterologist:  Dr. Abbey Chatters  Pre-Procedure History & Physical: HPI:  Sarah Coleman is a 71 y.o. female is here for a colonoscopy to be performed personal history of adenomatous colon polyps (2019) and family history of colon cancer (father).  Past Medical History:  Diagnosis Date   Allergic rhinitis    Allergy    Anemia    Arthritis    GERD (gastroesophageal reflux disease) 2013   Hyperlipidemia    Hypertension    Metabolic syndrome X 3154   Morbid obesity ([place]) 2000   Obesity    Prediabetes 2010   Seizures ([place])    as child; unknown etiology.   Vertigo     Past Surgical History:  Procedure Laterality Date   BIOPSY  04/11/2017   Procedure: BIOPSY;  Surgeon: Danie Binder, MD;  [facility];  Service: Endoscopy;;  gastric bx's   COLONOSCOPY N/A 10/07/2016   Procedure: COLONOSCOPY;  Surgeon: Danie Binder, MD;  [facility];  Service: Endoscopy;  Laterality: N/A;  9:30 AM   COLONOSCOPY WITH PROPOFOL N/A 11/01/2016   Procedure: COLONOSCOPY WITH PROPOFOL;  Surgeon: Danie Binder, MD;  [facility];  Service: Endoscopy;  Laterality: N/A;  830    COLONOSCOPY WITH PROPOFOL N/A 01/23/2018   Procedure: COLONOSCOPY WITH PROPOFOL;  Surgeon: Danie Binder, MD;  [facility];  Service: Endoscopy;  Laterality: N/A;  2:00pm - pt knows to arrive at 10:15   ESOPHAGOGASTRODUODENOSCOPY (EGD) WITH PROPOFOL N/A 04/11/2017   Procedure: ESOPHAGOGASTRODUODENOSCOPY (EGD) WITH PROPOFOL;  Surgeon: Danie Binder, MD;  [facility];  Service: Endoscopy;  Laterality: N/A;  10:45am   GERD     PARTIAL HYSTERECTOMY  1987   secondary to ovarian cyst    POLYPECTOMY  11/01/2016   Procedure: POLYPECTOMY;  Surgeon: Danie Binder, MD;  [facility];  Service: Endoscopy;;  colon   POLYPECTOMY  01/23/2018   Procedure: POLYPECTOMY;  Surgeon: Danie Binder, MD;  [facility];  Service: Endoscopy;;  ascending colon   SAVORY DILATION N/A 04/11/2017   Procedure: SAVORY DILATION;  Surgeon: Danie Binder, MD;  [facility];  Service: Endoscopy;  Laterality: N/A;   TUBAL LIGATION  1975    Prior to Admission medications   Medication Sig Start Date End Date Taking? Authorizing Provider  acetaminophen (TYLENOL) 500 MG tablet Take 1,000 mg by mouth every 8 (eight) hours as needed for moderate pain or headache.   Yes [provider]  aspirin EC 81 MG tablet Take 81 mg by mouth at bedtime.    Yes [provider]  azelastine (ASTELIN) 0.1 % nasal spray Use 2 sprays in each nostril every 12 hours for 1 week; After that, you may use 1 spray in each nostril twice a day as needed for allergies/congestion Patient taking differently: Place 1 spray into both nostrils 2 (two) times daily as needed for allergies or rhinitis. 12/25/20  Yes Noreene Larsson, NP  CALCIUM-MAGNESIUM-ZINC PO Take 1 tablet by mouth 2 (two) times daily.    Yes [provider]  carboxymethylcellulose (REFRESH PLUS) 0.5 % SOLN Place 1-2 drops into both eyes 3 (three) times daily as needed (for dry eyes).   Yes [provider]  chlorpheniramine (CHLOR-TRIMETON) 4 MG tablet Take one tablet once daily as needed, for excessive nasal drainage 07/16/19  Yes Fayrene Helper, MD  chlorthalidone (  HYGROTON) 25 MG tablet Take 1 tablet (25 mg total) by mouth daily. 08/27/21  Yes Fayrene Helper, MD  estradiol (ESTRACE) 0.1 MG/GM vaginal cream USE 1 APPLICATORFUL VAGINALLY EVERY OTHER DAY AS NEEDED FOR  VAGINAL  DISCOMFORT 08/17/21  Yes Dahlstedt, Annie Main, MD  ezetimibe (ZETIA) 10 MG tablet Take 1 tablet (10 mg total) by mouth daily. 04/27/21  Yes Fayrene Helper, MD  fluticasone Asencion Islam) 50 MCG/ACT nasal spray Use 2 spray(s) in each nostril once daily 08/27/21  Yes Fayrene Helper, MD  loratadine (CLARITIN) 10 MG tablet Take two tablets once daily for allergies  09/26/18  Yes Fayrene Helper, MD  lovastatin (MEVACOR) 40 MG tablet Take 1 tablet (40 mg total) by mouth at bedtime. 08/27/21  Yes Fayrene Helper, MD  meclizine (ANTIVERT) 12.5 MG tablet Take 1 tablet (12.5 mg total) by mouth 3 (three) times daily as needed for dizziness. 10/28/20  Yes Fayrene Helper, MD  montelukast (SINGULAIR) 10 MG tablet Take 1 tablet (10 mg total) by mouth at bedtime. 08/27/21  Yes Fayrene Helper, MD  Omega-3 Fatty Acids (OMEGA 3 500) 500 MG CAPS Take 500 mg by mouth daily.   Yes [provider]  potassium chloride SA (KLOR-CON M) 20 MEQ tablet TAKE 2 TABLETS BY MOUTH THREE TIMES DAILY 08/27/21  Yes Fayrene Helper, MD  solifenacin (VESICARE) 10 MG tablet Take 1 tablet (10 mg total) by mouth daily. Patient taking differently: Take 10 mg by mouth 3 (three) times a week. 03/16/21  Yes Dahlstedt, Annie Main, MD  spironolactone (ALDACTONE) 50 MG tablet Take one and a half tablets by mouth once daily 08/27/21  Yes Fayrene Helper, MD    Allergies as of 09/14/2021 - Review Complete 09/09/2021  Allergen Reaction Noted   Banana  12/25/2020   Cinnamon Swelling 10/24/2016   Ibuprofen Swelling 12/06/2007   Penicillins Nausea And Vomiting 12/06/2007   Sweet potato Swelling 10/24/2016    Family History  Problem Relation Age of Onset   Heart attack Mother    Heart disease Mother 45       massive heart attack   Prostate cancer Father    Colon cancer Father 59   Hypertension Sister    Ovarian cancer Sister    Cancer Brother 42       oral    Hypertension Brother    Heart disease Brother     Social History   Socioeconomic History   Marital status: Married    Spouse name: Not on file   Number of children: 5   Years of education: Not on file   Highest education level: Not on file  Occupational History   Occupation: retired   Tobacco Use   Smoking status: Former    Packs/day: 1.00    Years: 3.00    Pack years: 3.00    Types: Cigarettes     Quit date: 08/15/1974    Years since quitting: 47.1   Smokeless tobacco: Never  Vaping Use   Vaping Use: Never used  Substance and Sexual Activity   Alcohol use: No    Alcohol/week: 0.0 standard drinks   Drug use: No   Sexual activity: Not Currently    Birth control/protection: Surgical    Comment: hyst  Other Topics Concern   Not on file  Social History Narrative   Not on file   Social Determinants of Health   Financial Resource Strain: Low Risk    Difficulty of Paying Living Expenses: Not  hard at all  Food Insecurity: No Food Insecurity   Worried About Charity fundraiser in the Last Year: Never true   Ran Out of Food in the Last Year: Never true  Transportation Needs: No Transportation Needs   Lack of Transportation (Medical): No   Lack of Transportation (Non-Medical): No  Physical Activity: Insufficiently Active   Days of Exercise per Week: 3 days   Minutes of Exercise per Session: 30 min  Stress: No Stress Concern Present   Feeling of Stress : Not at all  Social Connections: Moderately Integrated   Frequency of Communication with Friends and Family: More than three times a week   Frequency of Social Gatherings with Friends and Family: More than three times a week   Attends Religious Services: More than 4 times per year   Active Member of [employer] or Organizations: No   Attends Archivist Meetings: Never   Marital Status: Married  Human resources officer Violence: Not At Risk   Fear of Current or Ex-Partner: No   Emotionally Abused: No   Physically Abused: No   Sexually Abused: No    Review of Systems: See HPI, otherwise negative ROS  Physical Exam: Vital signs in last 24 hours: Pulse Rate:  [85] 85 (02/13 0744) Resp:  [17] 17 (02/13 0744) BP: (126)/(75) 126/75 (02/13 0744) SpO2:  [96 %] 96 % (02/13 0744)   General:   Alert,  Well-developed, well-nourished, pleasant and cooperative in NAD Head:  Normocephalic and atraumatic. Eyes:  Sclera clear, no icterus.    Conjunctiva pink. Ears:  Normal auditory acuity. Nose:  No deformity, discharge,  or lesions. Mouth:  No deformity or lesions, dentition normal. Neck:  Supple; no masses or thyromegaly. Lungs:  Clear throughout to auscultation.   No wheezes, crackles, or rhonchi. No acute distress. Heart:  Regular rate and rhythm; no murmurs, clicks, rubs,  or gallops. Abdomen:  Soft, nontender and nondistended. No masses, hepatosplenomegaly or hernias noted. Normal bowel sounds, without guarding, and without rebound.   Msk:  Symmetrical without gross deformities. Normal posture. Extremities:  Without clubbing or edema. Neurologic:  Alert and  oriented x4;  grossly normal neurologically. Skin:  Intact without significant lesions or rashes. Cervical Nodes:  No significant cervical adenopathy. Psych:  Alert and cooperative. Normal mood and affect.  Impression/Plan: Sarah Coleman is here for a colonoscopy to be performed personal history of adenomatous colon polyps (2019) and family history of colon cancer (father).   The risks of the procedure including infection, bleed, or perforation as well as benefits, limitations, alternatives and imponderables have been reviewed with the patient. Questions have been answered. All parties agreeable.

## 2021-09-27 NOTE — Discharge Instructions (Addendum)
°  Colonoscopy Discharge Instructions  Read the instructions outlined below and refer to this sheet in the next few weeks. These discharge instructions provide you with general information on caring for yourself after you leave the hospital. Your doctor may also give you specific instructions. While your treatment has been planned according to the most current medical practices available, unavoidable complications occasionally occur.   ACTIVITY You may resume your regular activity, but move at a slower pace for the next 24 hours.  Take frequent rest periods for the next 24 hours.  Walking will help get rid of the air and reduce the bloated feeling in your belly (abdomen).  No driving for 24 hours (because of the medicine (anesthesia) used during the test).   Do not sign any important legal documents or operate any machinery for 24 hours (because of the anesthesia used during the test).  NUTRITION Drink plenty of fluids.  You may resume your normal diet as instructed by your doctor.  Begin with a light meal and progress to your normal diet. Heavy or fried foods are harder to digest and may make you feel sick to your stomach (nauseated).  Avoid alcoholic beverages for 24 hours or as instructed.  MEDICATIONS You may resume your normal medications unless your doctor tells you otherwise.  WHAT YOU CAN EXPECT TODAY Some feelings of bloating in the abdomen.  Passage of more gas than usual.  Spotting of blood in your stool or on the toilet paper.  IF YOU HAD POLYPS REMOVED DURING THE COLONOSCOPY: No aspirin products for 7 days or as instructed.  No alcohol for 7 days or as instructed.  Eat a soft diet for the next 24 hours.  FINDING OUT THE RESULTS OF YOUR TEST Not all test results are available during your visit. If your test results are not back during the visit, make an appointment with your caregiver to find out the results. Do not assume everything is normal if you have not heard from your  caregiver or the medical facility. It is important for you to follow up on all of your test results.  SEEK IMMEDIATE MEDICAL ATTENTION IF: You have more than a spotting of blood in your stool.  Your belly is swollen (abdominal distention).  You are nauseated or vomiting.  You have a temperature over 101.  You have abdominal pain or discomfort that is severe or gets worse throughout the day.   Your colonoscopy was relatively unremarkable.  I did not find any polyps or evidence of colon cancer.  I recommend repeating colonoscopy in 5 years for colon cancer screening purposes.  You do have diverticulosis and internal hemorrhoids. I would recommend increasing fiber in your diet or adding OTC Benefiber/Metamucil. Be sure to drink at least 4 to 6 glasses of water daily. Follow-up with GI as needed.   I hope you have a great rest of your week!  Charles K. Carver, D.O. Gastroenterology and Hepatology [facility]  

## 2021-09-27 NOTE — Anesthesia Procedure Notes (Signed)
Date/Time: 09/27/2021 8:54 AM Performed by: Orlie Dakin, CRNA Pre-anesthesia Checklist: Patient identified, Emergency Drugs available, Suction available and Patient being monitored Patient Re-evaluated:Patient Re-evaluated prior to induction Oxygen Delivery Method: Nasal cannula Induction Type: IV induction Placement Confirmation: positive ETCO2

## 2021-09-29 ENCOUNTER — Encounter (HOSPITAL_COMMUNITY): Payer: Self-pay | Admitting: Internal Medicine

## 2021-11-26 ENCOUNTER — Ambulatory Visit: Payer: PPO | Admitting: Family Medicine

## 2021-11-29 DIAGNOSIS — I10 Essential (primary) hypertension: Secondary | ICD-10-CM | POA: Diagnosis not present

## 2021-11-29 DIAGNOSIS — R7303 Prediabetes: Secondary | ICD-10-CM | POA: Diagnosis not present

## 2021-11-29 DIAGNOSIS — E785 Hyperlipidemia, unspecified: Secondary | ICD-10-CM | POA: Diagnosis not present

## 2021-11-30 LAB — CMP14+EGFR
ALT: 13 IU/L (ref 0–32)
AST: 14 IU/L (ref 0–40)
Albumin/Globulin Ratio: 2.1 (ref 1.2–2.2)
Albumin: 4.8 g/dL — ABNORMAL HIGH (ref 3.7–4.7)
Alkaline Phosphatase: 68 IU/L (ref 44–121)
BUN/Creatinine Ratio: 10 — ABNORMAL LOW (ref 12–28)
BUN: 12 mg/dL (ref 8–27)
Bilirubin Total: 0.4 mg/dL (ref 0.0–1.2)
CO2: 20 mmol/L (ref 20–29)
Calcium: 9.8 mg/dL (ref 8.7–10.3)
Chloride: 102 mmol/L (ref 96–106)
Creatinine, Ser: 1.2 mg/dL — ABNORMAL HIGH (ref 0.57–1.00)
Globulin, Total: 2.3 g/dL (ref 1.5–4.5)
Glucose: 102 mg/dL — ABNORMAL HIGH (ref 70–99)
Potassium: 4.9 mmol/L (ref 3.5–5.2)
Sodium: 135 mmol/L (ref 134–144)
Total Protein: 7.1 g/dL (ref 6.0–8.5)
eGFR: 48 mL/min/{1.73_m2} — ABNORMAL LOW (ref >59)

## 2021-11-30 LAB — LIPID PANEL
Chol/HDL Ratio: 4.6 ratio — ABNORMAL HIGH (ref 0.0–4.4)
Cholesterol, Total: 162 mg/dL (ref 100–199)
HDL: 35 mg/dL — ABNORMAL LOW (ref >39)
LDL Chol Calc (NIH): 74 mg/dL (ref 0–99)
Triglycerides: 328 mg/dL — ABNORMAL HIGH (ref 0–149)
VLDL Cholesterol Cal: 53 mg/dL — ABNORMAL HIGH (ref 5–40)

## 2021-11-30 LAB — HEMOGLOBIN A1C
Est. average glucose Bld gHb Est-mCnc: 126 mg/dL
Hgb A1c MFr Bld: 6 % — ABNORMAL HIGH (ref 4.8–5.6)

## 2021-12-01 ENCOUNTER — Telehealth: Payer: Self-pay

## 2021-12-01 NOTE — Telephone Encounter (Signed)
Patient returning lab call. Asked to return her call. ?

## 2021-12-01 NOTE — Telephone Encounter (Signed)
Patient aware.

## 2021-12-02 ENCOUNTER — Encounter: Payer: Self-pay | Admitting: Family Medicine

## 2021-12-02 ENCOUNTER — Ambulatory Visit (INDEPENDENT_AMBULATORY_CARE_PROVIDER_SITE_OTHER): Payer: PPO | Admitting: Family Medicine

## 2021-12-02 VITALS — BP 142/90 | HR 86 | Resp 16 | Ht 66.0 in | Wt 214.0 lb

## 2021-12-02 DIAGNOSIS — N1831 Chronic kidney disease, stage 3a: Secondary | ICD-10-CM | POA: Diagnosis not present

## 2021-12-02 DIAGNOSIS — B3731 Acute candidiasis of vulva and vagina: Secondary | ICD-10-CM

## 2021-12-02 DIAGNOSIS — E8881 Metabolic syndrome: Secondary | ICD-10-CM

## 2021-12-02 DIAGNOSIS — I10 Essential (primary) hypertension: Secondary | ICD-10-CM | POA: Diagnosis not present

## 2021-12-02 DIAGNOSIS — Z1231 Encounter for screening mammogram for malignant neoplasm of breast: Secondary | ICD-10-CM | POA: Diagnosis not present

## 2021-12-02 DIAGNOSIS — R7303 Prediabetes: Secondary | ICD-10-CM

## 2021-12-02 DIAGNOSIS — E785 Hyperlipidemia, unspecified: Secondary | ICD-10-CM | POA: Diagnosis not present

## 2021-12-02 DIAGNOSIS — N183 Chronic kidney disease, stage 3 unspecified: Secondary | ICD-10-CM | POA: Insufficient documentation

## 2021-12-02 MED ORDER — FENOFIBRATE 48 MG PO TABS: 48.0000 mg | ORAL_TABLET | Freq: Every day | ORAL | 1 refills | Status: DC

## 2021-12-02 MED ORDER — SPIRONOLACTONE 100 MG PO TABS: 100.0000 mg | ORAL_TABLET | Freq: Every day | ORAL | 1 refills | Status: DC

## 2021-12-02 NOTE — Patient Instructions (Addendum)
F/U in 14 weeks, call if you need me sooner ? ?Please schedule September mammogram at checkout ? ?Stop ezetemide, new for high triglyceride is fenofibrate 48 mg daily, continue lovastatin as before ? ?Blood pressure is still high. Increase spironolactone to 100 mg once daily, OK to take TWO 50 mg tablets together daily until done ? ?PLEASE change food choice , eat mainly fresh vegetables and fruit, and drink at least 64 ounces water daily ? ?PLEASE get shingrix vaccines ? ?Fasting lipid , cmp and EGFR 5 days before next visit ? ?You are referred to Nephrologist because of chronic kidney disease. ? ?PLEASE only tylenol for pain, all other OTC pain meds may damage your kidneys ? ?It is important that you exercise regularly at least 30 minutes 5 times a week. If you develop chest pain, have severe difficulty breathing, or feel very tired, stop exercising immediately and seek medical attention  ? ?Thanks for choosing [facility], we consider it a privelige to serve you. ? ? ?

## 2021-12-03 ENCOUNTER — Telehealth: Payer: Self-pay | Admitting: Family Medicine

## 2021-12-03 MED ORDER — FLUCONAZOLE 150 MG PO TABS: 150.0000 mg | ORAL_TABLET | Freq: Once | ORAL | 0 refills | Status: AC

## 2021-12-03 NOTE — Telephone Encounter (Signed)
Pt aware.

## 2021-12-03 NOTE — Telephone Encounter (Signed)
LMTRC

## 2021-12-03 NOTE — Telephone Encounter (Signed)
States she has been having yeast, some odor with itch and some white discharge and wants a fluconazole sent in. Please advise  ?

## 2021-12-03 NOTE — Progress Notes (Signed)
? ?Sarah Coleman     MRN: [id number]      DOB: 05/30/1951 ? ? ?HPI ?Sarah Coleman is here for follow up and re-evaluation of chronic medical conditions, medication management and review of any available recent lab and radiology data.  ?Preventive health is updated, specifically  Cancer screening and Immunization.   ?Questions or concerns regarding consultations or procedures which the PT has had in the interim are  addressed. ?The PT denies any adverse reactions to current medications since the last visit.  ?C/o increased and uncontrolled allrgy symptoms with thick clear nasal drainage, no fer v, chills or productive cough  ? ?ROS ?Denies recent fever or chills. ?Denies , ear pain or sore throat. ?Denies chest congestion, productive cough or wheezing. ?Denies chest pains, palpitations and leg swelling ?Denies abdominal pain, nausea, vomiting,diarrhea or constipation.   ?Denies dysuria, frequency, hesitancy or incontinence. ?Denies joint pain, swelling and limitation in mobility. ?Denies headaches, seizures, numbness, or tingling. ?Denies depression, anxiety or insomnia. ?Denies skin break down or rash. ? ? ?PE ? ?BP (!) 142/90   Pulse 86   Resp 16   Ht '5\' 6"'$  (1.676 m)   Wt 214 lb (97.1 kg)   SpO2 94%   BMI 34.54 kg/m?  ? ?Patient alert and oriented and in no cardiopulmonary distress. ? ?HEENT: No facial asymmetry, EOMI,     Neck supple . ? ?Chest: Clear to auscultation bilaterally. ? ?CVS: S1, S2 no murmurs, no S3.Regular rate. ? ?ABD: Soft non tender.  ? ?Ext: No edema ? ?MS: Adequate ROM spine, shoulders, hips and knees. ? ?Skin: Intact, no ulcerations or rash noted. ? ?Psych: Good eye contact, normal affect. Memory intact not anxious or depressed appearing. ? ?CNS: CN 2-12 intact, power,  normal throughout.no focal deficits noted. ? ? ?Assessment & Plan ?Essential hypertension ?Uncontrolled, Increase spironolactone to 100 mg daily ?DASH diet and commitment to daily physical activity for a minimum of 30 minutes  discussed and encouraged, as a part of hypertension management. ?The importance of attaining a healthy weight is also discussed. ? ? ?  12/02/2021  ?  2:53 PM 12/02/2021  ?  2:40 PM 12/02/2021  ?  2:09 PM 09/27/2021  ?  9:08 AM 09/27/2021  ?  7:44 AM 09/23/2021  ?  1:17 PM 09/09/2021  ?  9:29 AM  ?BP/Weight  ?Systolic BP 979 892 119 417 126    ?Diastolic BP 90 84 86 72 75    ?Wt. (Lbs)   214   214 214  ?BMI   34.54 kg/m2   34.54 kg/m2 34.54 kg/m2  ? ? ? ? ? ?CKD (chronic kidney disease) stage 3, GFR 30-59 ml/min (HCC) ?Needs to be followed by Nephrology and is referred,importance of avoiding nephrotoxic drugs and improving BP , blood sugar and inc natural unprocessed foods is discussed ? ?Hyperlipidemia LDL goal <100 ?Hyperlipidemia:Low fat diet discussed and encouraged. ? ? ?Lipid Panel  ?Lab Results  ?Component Value Date  ? CHOL 162 11/29/2021  ? HDL 35 (L) 11/29/2021  ? [address] 74 11/29/2021  ? LDLDIRECT 69 02/13/2009  ? TRIG 328 (H) 11/29/2021  ? CHOLHDL 4.6 (H) 11/29/2021  ? ? ? ?Start fenofibrate and lower fat  ? ?Morbid obesity ([place]) ? ?Patient re-educated about  the importance of commitment to a  minimum of 150 minutes of exercise per week as able. ? ?The importance of healthy food choices with portion control discussed, as well as eating regularly and within a 12 hour window  most days. ?The need to choose "clean , green" food 50 to 75% of the time is discussed, as well as to make water the primary drink and set a goal of 64 ounces water daily. ? ?  ? ?  12/02/2021  ?  2:09 PM 09/23/2021  ?  1:17 PM 09/09/2021  ?  9:29 AM  ?Weight /BMI  ?Weight 214 lb 214 lb 214 lb  ?Height '5\' 6"'$  (1.676 m) '5\' 6"'$  (1.676 m) '5\' 6"'$  (1.676 m)  ?BMI 34.54 kg/m2 34.54 kg/m2 34.54 kg/m2  ? ? ? ? ?Metabolic syndrome X ?The increased risk of cardiovascular disease associated with this diagnosis, and the need to consistently work on lifestyle to change this is discussed. ?Following  a  heart healthy diet ,commitment to 30 minutes of exercise at  least 5 days per week, as well as control of blood sugar and cholesterol , and achieving a healthy weight are all the areas to be addressed . ? ? ?Prediabetes ?Patient educated about the importance of limiting  Carbohydrate intake , the need to commit to daily physical activity for a minimum of 30 minutes , and to commit weight loss. ?The fact that changes in all these areas will reduce or eliminate all together the development of diabetes is stressed.  ?Deteriorated ? ? ?  Latest Ref Rng & Units 11/29/2021  ?  9:28 AM 09/23/2021  ?  1:22 PM 08/12/2021  ? 11:08 AM 04/26/2021  ?  9:31 AM 12/03/2020  ? 10:07 AM  ?Diabetic Labs  ?HbA1c 4.8 - 5.6 % 6.0     5.7   6.2    ?Chol 100 - 199 mg/dL 162    155   162   175    ?HDL >39 mg/dL 35    38   33   33    ?Calc LDL 0 - 99 mg/dL 74    70   70   85    ?Triglycerides 0 - 149 mg/dL 328    293   374   350    ?Creatinine 0.57 - 1.00 mg/dL 1.20   1.21   1.07   0.90   1.10    ? ? ?  12/02/2021  ?  2:53 PM 12/02/2021  ?  2:40 PM 12/02/2021  ?  2:09 PM 09/27/2021  ?  9:08 AM 09/27/2021  ?  7:44 AM 09/23/2021  ?  1:17 PM 09/09/2021  ?  9:29 AM  ?BP/Weight  ?Systolic BP 194 174 081 448 126    ?Diastolic BP 90 84 86 72 75    ?Wt. (Lbs)   214   214 214  ?BMI   34.54 kg/m2   34.54 kg/m2 34.54 kg/m2  ? ?   ? View : No data to display.  ?  ?  ?  ? ? ? ? ?Yeast vaginitis ?Fluconazole x 1 precribed, called in with symptoms of itch and odor after visit ? ? ?

## 2021-12-03 NOTE — Telephone Encounter (Signed)
Patient called in reference to visit on 4/20  ? ?Patient forgot to mention she needed a pill for yeast. ? ? ?Patient wants a call back  ?

## 2021-12-05 ENCOUNTER — Encounter: Payer: Self-pay | Admitting: Family Medicine

## 2021-12-05 DIAGNOSIS — B3731 Acute candidiasis of vulva and vagina: Secondary | ICD-10-CM | POA: Insufficient documentation

## 2021-12-05 NOTE — Assessment & Plan Note (Signed)
The increased risk of cardiovascular disease associated with this diagnosis, and the need to consistently work on lifestyle to change this is discussed. Following  a  heart healthy diet ,commitment to 30 minutes of exercise at least 5 days per week, as well as control of blood sugar and cholesterol , and achieving a healthy weight are all the areas to be addressed .  

## 2021-12-05 NOTE — Assessment & Plan Note (Signed)
Hyperlipidemia:Low fat diet discussed and encouraged. ? ? ?Lipid Panel  ?Lab Results  ?Component Value Date  ? CHOL 162 11/29/2021  ? HDL 35 (L) 11/29/2021  ? [place] 74 11/29/2021  ? LDLDIRECT 69 02/13/2009  ? TRIG 328 (H) 11/29/2021  ? CHOLHDL 4.6 (H) 11/29/2021  ? ? ? ?Start fenofibrate and lower fat  ?

## 2021-12-05 NOTE — Assessment & Plan Note (Signed)
?  Patient re-educated about  the importance of commitment to a  minimum of 150 minutes of exercise per week as able. ? ?The importance of healthy food choices with portion control discussed, as well as eating regularly and within a 12 hour window most days. ?The need to choose "clean , green" food 50 to 75% of the time is discussed, as well as to make water the primary drink and set a goal of 64 ounces water daily. ? ?  ? ?  12/02/2021  ?  2:09 PM 09/23/2021  ?  1:17 PM 09/09/2021  ?  9:29 AM  ?Weight /BMI  ?Weight 214 lb 214 lb 214 lb  ?Height '5\' 6"'$  (1.676 m) '5\' 6"'$  (1.676 m) '5\' 6"'$  (1.676 m)  ?BMI 34.54 kg/m2 34.54 kg/m2 34.54 kg/m2  ? ? ? ?

## 2021-12-05 NOTE — Assessment & Plan Note (Signed)
Uncontrolled, Increase spironolactone to 100 mg daily ?DASH diet and commitment to daily physical activity for a minimum of 30 minutes discussed and encouraged, as a part of hypertension management. ?The importance of attaining a healthy weight is also discussed. ? ? ?  12/02/2021  ?  2:53 PM 12/02/2021  ?  2:40 PM 12/02/2021  ?  2:09 PM 09/27/2021  ?  9:08 AM 09/27/2021  ?  7:44 AM 09/23/2021  ?  1:17 PM 09/09/2021  ?  9:29 AM  ?BP/Weight  ?Systolic BP 259 563 875 643 126    ?Diastolic BP 90 84 86 72 75    ?Wt. (Lbs)   214   214 214  ?BMI   34.54 kg/m2   34.54 kg/m2 34.54 kg/m2  ? ? ? ? ?

## 2021-12-05 NOTE — Assessment & Plan Note (Addendum)
Patient educated about the importance of limiting  Carbohydrate intake , the need to commit to daily physical activity for a minimum of 30 minutes , and to commit weight loss. ?The fact that changes in all these areas will reduce or eliminate all together the development of diabetes is stressed.  ?Deteriorated ? ? ?  Latest Ref Rng & Units 11/29/2021  ?  9:28 AM 09/23/2021  ?  1:22 PM 08/12/2021  ? 11:08 AM 04/26/2021  ?  9:31 AM 12/03/2020  ? 10:07 AM  ?Diabetic Labs  ?HbA1c 4.8 - 5.6 % 6.0     5.7   6.2    ?Chol 100 - 199 mg/dL 162    155   162   175    ?HDL >39 mg/dL 35    38   33   33    ?Calc LDL 0 - 99 mg/dL 74    70   70   85    ?Triglycerides 0 - 149 mg/dL 328    293   374   350    ?Creatinine 0.57 - 1.00 mg/dL 1.20   1.21   1.07   0.90   1.10    ? ? ?  12/02/2021  ?  2:53 PM 12/02/2021  ?  2:40 PM 12/02/2021  ?  2:09 PM 09/27/2021  ?  9:08 AM 09/27/2021  ?  7:44 AM 09/23/2021  ?  1:17 PM 09/09/2021  ?  9:29 AM  ?BP/Weight  ?Systolic BP 536 644 034 742 126    ?Diastolic BP 90 84 86 72 75    ?Wt. (Lbs)   214   214 214  ?BMI   34.54 kg/m2   34.54 kg/m2 34.54 kg/m2  ? ?   ? View : No data to display.  ?  ?  ?  ? ? ? ?

## 2021-12-05 NOTE — Assessment & Plan Note (Signed)
Needs to be followed by Nephrology and is referred,importance of avoiding nephrotoxic drugs and improving BP , blood sugar and inc natural unprocessed foods is discussed ?

## 2021-12-05 NOTE — Assessment & Plan Note (Signed)
Fluconazole x 1 precribed, called in with symptoms of itch and odor after visit ?

## 2021-12-17 ENCOUNTER — Other Ambulatory Visit: Payer: Self-pay | Admitting: Family Medicine

## 2022-01-12 DIAGNOSIS — I129 Hypertensive chronic kidney disease with stage 1 through stage 4 chronic kidney disease, or unspecified chronic kidney disease: Secondary | ICD-10-CM | POA: Diagnosis not present

## 2022-01-12 DIAGNOSIS — N1831 Chronic kidney disease, stage 3a: Secondary | ICD-10-CM | POA: Diagnosis not present

## 2022-01-12 DIAGNOSIS — I5032 Chronic diastolic (congestive) heart failure: Secondary | ICD-10-CM | POA: Diagnosis not present

## 2022-01-12 DIAGNOSIS — E871 Hypo-osmolality and hyponatremia: Secondary | ICD-10-CM | POA: Diagnosis not present

## 2022-01-12 DIAGNOSIS — R82998 Other abnormal findings in urine: Secondary | ICD-10-CM | POA: Diagnosis not present

## 2022-01-12 DIAGNOSIS — R7303 Prediabetes: Secondary | ICD-10-CM | POA: Diagnosis not present

## 2022-01-14 ENCOUNTER — Other Ambulatory Visit: Payer: Self-pay | Admitting: Nephrology

## 2022-01-14 DIAGNOSIS — N1831 Chronic kidney disease, stage 3a: Secondary | ICD-10-CM

## 2022-01-14 DIAGNOSIS — I5032 Chronic diastolic (congestive) heart failure: Secondary | ICD-10-CM

## 2022-01-14 DIAGNOSIS — E871 Hypo-osmolality and hyponatremia: Secondary | ICD-10-CM

## 2022-01-14 DIAGNOSIS — R7303 Prediabetes: Secondary | ICD-10-CM

## 2022-01-14 DIAGNOSIS — I129 Hypertensive chronic kidney disease with stage 1 through stage 4 chronic kidney disease, or unspecified chronic kidney disease: Secondary | ICD-10-CM

## 2022-01-17 DIAGNOSIS — I129 Hypertensive chronic kidney disease with stage 1 through stage 4 chronic kidney disease, or unspecified chronic kidney disease: Secondary | ICD-10-CM | POA: Diagnosis not present

## 2022-01-17 DIAGNOSIS — E871 Hypo-osmolality and hyponatremia: Secondary | ICD-10-CM | POA: Diagnosis not present

## 2022-01-17 DIAGNOSIS — D511 Vitamin B12 deficiency anemia due to selective vitamin B12 malabsorption with proteinuria: Secondary | ICD-10-CM | POA: Diagnosis not present

## 2022-01-17 DIAGNOSIS — R82998 Other abnormal findings in urine: Secondary | ICD-10-CM | POA: Diagnosis not present

## 2022-01-17 DIAGNOSIS — I5032 Chronic diastolic (congestive) heart failure: Secondary | ICD-10-CM | POA: Diagnosis not present

## 2022-01-17 DIAGNOSIS — R7303 Prediabetes: Secondary | ICD-10-CM | POA: Diagnosis not present

## 2022-01-17 DIAGNOSIS — N1831 Chronic kidney disease, stage 3a: Secondary | ICD-10-CM | POA: Diagnosis not present

## 2022-01-18 ENCOUNTER — Ambulatory Visit (HOSPITAL_COMMUNITY)
Admission: RE | Admit: 2022-01-18 | Discharge: 2022-01-18 | Disposition: A | Discharge status: Home / Self Care | Payer: PPO | Source: Ambulatory Visit | Attending: Nephrology | Admitting: Nephrology

## 2022-01-18 DIAGNOSIS — N1831 Chronic kidney disease, stage 3a: Secondary | ICD-10-CM | POA: Diagnosis not present

## 2022-01-18 DIAGNOSIS — I5032 Chronic diastolic (congestive) heart failure: Secondary | ICD-10-CM | POA: Insufficient documentation

## 2022-01-18 DIAGNOSIS — E871 Hypo-osmolality and hyponatremia: Secondary | ICD-10-CM | POA: Diagnosis not present

## 2022-01-18 DIAGNOSIS — I129 Hypertensive chronic kidney disease with stage 1 through stage 4 chronic kidney disease, or unspecified chronic kidney disease: Secondary | ICD-10-CM | POA: Insufficient documentation

## 2022-01-18 DIAGNOSIS — R7303 Prediabetes: Secondary | ICD-10-CM | POA: Diagnosis not present

## 2022-01-18 DIAGNOSIS — N183 Chronic kidney disease, stage 3 unspecified: Secondary | ICD-10-CM | POA: Diagnosis not present

## 2022-01-27 ENCOUNTER — Other Ambulatory Visit: Payer: Self-pay | Admitting: Family Medicine

## 2022-02-02 ENCOUNTER — Other Ambulatory Visit: Payer: Self-pay | Admitting: Family Medicine

## 2022-02-03 ENCOUNTER — Other Ambulatory Visit: Payer: Self-pay

## 2022-02-03 ENCOUNTER — Telehealth: Payer: Self-pay | Admitting: Family Medicine

## 2022-02-03 DIAGNOSIS — H6502 Acute serous otitis media, left ear: Secondary | ICD-10-CM

## 2022-02-03 MED ORDER — AZELASTINE HCL 0.1 % NA SOLN: NASAL | 12 refills | Status: DC

## 2022-02-03 NOTE — Telephone Encounter (Signed)
Refills sent

## 2022-02-03 NOTE — Telephone Encounter (Signed)
Patient needs refill on   AZELASTINE HCL NASAL SPRAY 1.0%

## 2022-02-07 ENCOUNTER — Other Ambulatory Visit: Payer: Self-pay | Admitting: Family Medicine

## 2022-02-08 ENCOUNTER — Telehealth: Payer: Self-pay | Admitting: Family Medicine

## 2022-02-17 DIAGNOSIS — E871 Hypo-osmolality and hyponatremia: Secondary | ICD-10-CM | POA: Diagnosis not present

## 2022-02-17 DIAGNOSIS — I129 Hypertensive chronic kidney disease with stage 1 through stage 4 chronic kidney disease, or unspecified chronic kidney disease: Secondary | ICD-10-CM | POA: Diagnosis not present

## 2022-02-17 DIAGNOSIS — N270 Small kidney, unilateral: Secondary | ICD-10-CM | POA: Diagnosis not present

## 2022-02-17 DIAGNOSIS — E611 Iron deficiency: Secondary | ICD-10-CM | POA: Diagnosis not present

## 2022-02-17 DIAGNOSIS — N1832 Chronic kidney disease, stage 3b: Secondary | ICD-10-CM | POA: Diagnosis not present

## 2022-02-17 DIAGNOSIS — D1803 Hemangioma of intra-abdominal structures: Secondary | ICD-10-CM | POA: Diagnosis not present

## 2022-02-17 DIAGNOSIS — Z6834 Body mass index (BMI) 34.0-34.9, adult: Secondary | ICD-10-CM | POA: Diagnosis not present

## 2022-02-17 DIAGNOSIS — D472 Monoclonal gammopathy: Secondary | ICD-10-CM | POA: Diagnosis not present

## 2022-02-17 DIAGNOSIS — I5032 Chronic diastolic (congestive) heart failure: Secondary | ICD-10-CM | POA: Diagnosis not present

## 2022-03-04 DIAGNOSIS — E785 Hyperlipidemia, unspecified: Secondary | ICD-10-CM | POA: Diagnosis not present

## 2022-03-04 DIAGNOSIS — I10 Essential (primary) hypertension: Secondary | ICD-10-CM | POA: Diagnosis not present

## 2022-03-05 LAB — CMP14+EGFR
ALT: 23 IU/L (ref 0–32)
AST: 21 IU/L (ref 0–40)
Albumin/Globulin Ratio: 1.7 (ref 1.2–2.2)
Albumin: 4.9 g/dL — ABNORMAL HIGH (ref 3.8–4.8)
Alkaline Phosphatase: 93 IU/L (ref 44–121)
BUN/Creatinine Ratio: 8 — ABNORMAL LOW (ref 12–28)
BUN: 11 mg/dL (ref 8–27)
Bilirubin Total: 0.3 mg/dL (ref 0.0–1.2)
CO2: 20 mmol/L (ref 20–29)
Calcium: 10.2 mg/dL (ref 8.7–10.3)
Chloride: 101 mmol/L (ref 96–106)
Creatinine, Ser: 1.31 mg/dL — ABNORMAL HIGH (ref 0.57–1.00)
Globulin, Total: 2.9 g/dL (ref 1.5–4.5)
Glucose: 114 mg/dL — ABNORMAL HIGH (ref 70–99)
Potassium: 4.8 mmol/L (ref 3.5–5.2)
Sodium: 132 mmol/L — ABNORMAL LOW (ref 134–144)
Total Protein: 7.8 g/dL (ref 6.0–8.5)
eGFR: 43 mL/min/{1.73_m2} — ABNORMAL LOW (ref >59)

## 2022-03-05 LAB — LIPID PANEL
Chol/HDL Ratio: 3.9 ratio (ref 0.0–4.4)
Cholesterol, Total: 138 mg/dL (ref 100–199)
HDL: 35 mg/dL — ABNORMAL LOW (ref >39)
LDL Chol Calc (NIH): 74 mg/dL (ref 0–99)
Triglycerides: 167 mg/dL — ABNORMAL HIGH (ref 0–149)
VLDL Cholesterol Cal: 29 mg/dL (ref 5–40)

## 2022-03-10 ENCOUNTER — Encounter: Payer: Self-pay | Admitting: Family Medicine

## 2022-03-10 ENCOUNTER — Ambulatory Visit (INDEPENDENT_AMBULATORY_CARE_PROVIDER_SITE_OTHER): Payer: PPO | Admitting: Family Medicine

## 2022-03-10 VITALS — BP 148/82 | HR 85 | Temp 99.4°F | Resp 16 | Ht 66.0 in | Wt 204.8 lb

## 2022-03-10 DIAGNOSIS — I10 Essential (primary) hypertension: Secondary | ICD-10-CM | POA: Diagnosis not present

## 2022-03-10 DIAGNOSIS — J302 Other seasonal allergic rhinitis: Secondary | ICD-10-CM | POA: Diagnosis not present

## 2022-03-10 DIAGNOSIS — D472 Monoclonal gammopathy: Secondary | ICD-10-CM | POA: Diagnosis not present

## 2022-03-10 DIAGNOSIS — R7303 Prediabetes: Secondary | ICD-10-CM

## 2022-03-10 DIAGNOSIS — R053 Chronic cough: Secondary | ICD-10-CM | POA: Diagnosis not present

## 2022-03-10 MED ORDER — AMLODIPINE BESYLATE 5 MG PO TABS: 5.0000 mg | ORAL_TABLET | Freq: Every day | ORAL | 3 refills | Status: DC

## 2022-03-10 MED ORDER — BENZONATATE 100 MG PO CAPS: 100.0000 mg | ORAL_CAPSULE | Freq: Three times a day (TID) | ORAL | 0 refills | Status: DC | PRN

## 2022-03-10 NOTE — Patient Instructions (Addendum)
Annual exam in office and re eval blood pressure end September, call if you need me sooner  Flu vaccine at visit  New additional  blood pressure medication is amlodipine 5 mg one daily, continue all meds you are on  Tessalon perles are prescribed for cough  Follow through on referral to hematology please    Please get TdaP, covid booster and shingles vaccine #2  Nurse pls verify date of shingrix #1 and enter  Thanks for choosing [facility], we consider it a privelige to serve you.

## 2022-03-12 ENCOUNTER — Encounter: Payer: Self-pay | Admitting: Family Medicine

## 2022-03-12 DIAGNOSIS — R059 Cough, unspecified: Secondary | ICD-10-CM | POA: Insufficient documentation

## 2022-03-12 DIAGNOSIS — D472 Monoclonal gammopathy: Secondary | ICD-10-CM | POA: Insufficient documentation

## 2022-03-12 NOTE — Progress Notes (Signed)
Sarah Coleman     MRN: [id number]      DOB: 1950-11-24   HPI Sarah Coleman is here for follow up and re-evaluation of chronic medical conditions, medication management and review of any available recent lab and radiology data.  Preventive health is updated, specifically  Cancer screening and Immunization.   New referral to heme/ onc because of MGUS, discussed this with her and she understands and accepts the need to follow through The PT denies any adverse reactions to current medications since the last visit.  2 week h/o increased clear nasal drainage with cough, denies fever or chills   ROS Denies recent fever or chills. . Denies chest pains, palpitations and leg swelling Denies abdominal pain, nausea, vomiting,diarrhea or constipation.   Denies dysuria, frequency, hesitancy or incontinence. Denies joint pain, swelling and limitation in mobility. Denies headaches, seizures, numbness, or tingling. Denies depression, anxiety or insomnia. Denies skin break down or rash.   PE  BP (!) 148/82   Pulse 85   Temp 99.4 F (37.4 C) (Oral)   Resp 16   Ht '5\' 6"'$  (1.676 m)   Wt 204 lb 12.8 oz (92.9 kg)   SpO2 95%   BMI 33.06 kg/m   Patient alert and oriented and in no cardiopulmonary distress.  HEENT: No facial asymmetry, EOMI,     Neck supple .No sinus tenderness, positive nasal congestion  Chest: Clear to auscultation bilaterally.  CVS: S1, S2 no murmurs, no S3.Regular rate.  ABD: Soft non tender.   Ext: No edema  MS: Adequate ROM spine, shoulders, hips and knees.  Skin: Intact, no ulcerations or rash noted.  Psych: Good eye contact, normal affect. Memory intact not anxious or depressed appearing.  CNS: CN 2-12 intact, power,  normal throughout.no focal deficits noted.   Assessment & Plan  Essential hypertension Uncontrolled , add amlodipine 5 mg DASH diet and commitment to daily physical activity for a minimum of 30 minutes discussed and encouraged, as a part of  hypertension management. The importance of attaining a healthy weight is also discussed.     03/10/2022    2:06 PM 12/02/2021    2:53 PM 12/02/2021    2:40 PM 12/02/2021    2:09 PM 09/27/2021    9:08 AM 09/27/2021    7:44 AM 09/23/2021    1:17 PM  BP/Weight  Systolic BP 409 811 914 782 956 213   Diastolic BP 82 90 84 86 72 75   Wt. (Lbs) 204.8   214   214  BMI 33.06 kg/m2   34.54 kg/m2   34.54 kg/m2       Prediabetes Patient educated about the importance of limiting  Carbohydrate intake , the need to commit to daily physical activity for a minimum of 30 minutes , and to commit weight loss. The fact that changes in all these areas will reduce or eliminate all together the development of diabetes is stressed.      Latest Ref Rng & Units 03/04/2022    9:44 AM 11/29/2021    9:28 AM 09/23/2021    1:22 PM 08/12/2021   11:08 AM 04/26/2021    9:31 AM  Diabetic Labs  HbA1c 4.8 - 5.6 %  6.0    5.7   Chol 100 - 199 mg/dL 138  162   155  162   HDL >39 mg/dL 35  35   38  33   Calc LDL 0 - 99 mg/dL 74  74   70  70   Triglycerides 0 - 149 mg/dL 167  328   293  374   Creatinine 0.57 - 1.00 mg/dL 1.31  1.20  1.21  1.07  0.90       03/10/2022    2:06 PM 12/02/2021    2:53 PM 12/02/2021    2:40 PM 12/02/2021    2:09 PM 09/27/2021    9:08 AM 09/27/2021    7:44 AM 09/23/2021    1:17 PM  BP/Weight  Systolic BP 262 035 597 416 384 536   Diastolic BP 82 90 84 86 72 75   Wt. (Lbs) 204.8   214   214  BMI 33.06 kg/m2   34.54 kg/m2   34.54 kg/m2       No data to display            Seasonal allergies Continue current meds, sub optimal control currently but on max meds  Morbid obesity (HCC) Excellent weight loss which she is appaluded on  Patient re-educated about  the importance of commitment to a  minimum of 150 minutes of exercise per week as able.  The importance of healthy food choices with portion control discussed, as well as eating regularly and within a 12 hour window most days. The  need to choose "clean , green" food 50 to 75% of the time is discussed, as well as to make water the primary drink and set a goal of 64 ounces water daily.       03/10/2022    2:06 PM 12/02/2021    2:09 PM 09/23/2021    1:17 PM  Weight /BMI  Weight 204 lb 12.8 oz 214 lb 214 lb  Height '5\' 6"'$  (1.676 m) '5\' 6"'$  (1.676 m) '5\' 6"'$  (1.676 m)  BMI 33.06 kg/m2 34.54 kg/m2 34.54 kg/m2      MGUS (monoclonal gammopathy of unknown significance) Referred to heme/onc by nephrology, pt aware   Cough Reports response to tessalon perles, prescribed for as needed use

## 2022-03-12 NOTE — Assessment & Plan Note (Signed)
Patient educated about the importance of limiting  Carbohydrate intake , the need to commit to daily physical activity for a minimum of 30 minutes , and to commit weight loss. The fact that changes in all these areas will reduce or eliminate all together the development of diabetes is stressed.      Latest Ref Rng & Units 03/04/2022    9:44 AM 11/29/2021    9:28 AM 09/23/2021    1:22 PM 08/12/2021   11:08 AM 04/26/2021    9:31 AM  Diabetic Labs  HbA1c 4.8 - 5.6 %  6.0    5.7   Chol 100 - 199 mg/dL 138  162   155  162   HDL >39 mg/dL 35  35   38  33   Calc LDL 0 - 99 mg/dL 74  74   70  70   Triglycerides 0 - 149 mg/dL 167  328   293  374   Creatinine 0.57 - 1.00 mg/dL 1.31  1.20  1.21  1.07  0.90       03/10/2022    2:06 PM 12/02/2021    2:53 PM 12/02/2021    2:40 PM 12/02/2021    2:09 PM 09/27/2021    9:08 AM 09/27/2021    7:44 AM 09/23/2021    1:17 PM  BP/Weight  Systolic BP 923 300 762 263 335 456   Diastolic BP 82 90 84 86 72 75   Wt. (Lbs) 204.8   214   214  BMI 33.06 kg/m2   34.54 kg/m2   34.54 kg/m2       No data to display

## 2022-03-12 NOTE — Assessment & Plan Note (Signed)
Uncontrolled , add amlodipine 5 mg DASH diet and commitment to daily physical activity for a minimum of 30 minutes discussed and encouraged, as a part of hypertension management. The importance of attaining a healthy weight is also discussed.     03/10/2022    2:06 PM 12/02/2021    2:53 PM 12/02/2021    2:40 PM 12/02/2021    2:09 PM 09/27/2021    9:08 AM 09/27/2021    7:44 AM 09/23/2021    1:17 PM  BP/Weight  Systolic BP 409 735 329 924 268 341   Diastolic BP 82 90 84 86 72 75   Wt. (Lbs) 204.8   214   214  BMI 33.06 kg/m2   34.54 kg/m2   34.54 kg/m2

## 2022-03-12 NOTE — Assessment & Plan Note (Signed)
Reports response to tessalon perles, prescribed for as needed use

## 2022-03-12 NOTE — Assessment & Plan Note (Signed)
Excellent weight loss which she is appaluded on  Patient re-educated about  the importance of commitment to a  minimum of 150 minutes of exercise per week as able.  The importance of healthy food choices with portion control discussed, as well as eating regularly and within a 12 hour window most days. The need to choose "clean , green" food 50 to 75% of the time is discussed, as well as to make water the primary drink and set a goal of 64 ounces water daily.       03/10/2022    2:06 PM 12/02/2021    2:09 PM 09/23/2021    1:17 PM  Weight /BMI  Weight 204 lb 12.8 oz 214 lb 214 lb  Height '5\' 6"'$  (1.676 m) '5\' 6"'$  (1.676 m) '5\' 6"'$  (1.676 m)  BMI 33.06 kg/m2 34.54 kg/m2 34.54 kg/m2

## 2022-03-12 NOTE — Assessment & Plan Note (Signed)
Referred to heme/onc by nephrology, pt aware

## 2022-03-12 NOTE — Assessment & Plan Note (Signed)
Continue current meds, sub optimal control currently but on max meds

## 2022-03-21 ENCOUNTER — Other Ambulatory Visit: Payer: Self-pay | Admitting: Family Medicine

## 2022-04-01 ENCOUNTER — Other Ambulatory Visit (HOSPITAL_COMMUNITY): Payer: Self-pay | Admitting: Nephrology

## 2022-04-01 DIAGNOSIS — N1832 Chronic kidney disease, stage 3b: Secondary | ICD-10-CM

## 2022-04-05 ENCOUNTER — Encounter: Payer: Self-pay | Admitting: Family Medicine

## 2022-04-05 ENCOUNTER — Ambulatory Visit (INDEPENDENT_AMBULATORY_CARE_PROVIDER_SITE_OTHER): Payer: PPO | Admitting: Family Medicine

## 2022-04-05 VITALS — BP 131/79 | HR 88 | Ht 66.0 in | Wt 203.1 lb

## 2022-04-05 DIAGNOSIS — R1084 Generalized abdominal pain: Secondary | ICD-10-CM

## 2022-04-05 DIAGNOSIS — K219 Gastro-esophageal reflux disease without esophagitis: Secondary | ICD-10-CM

## 2022-04-05 LAB — POCT URINALYSIS DIP (CLINITEK)
Bilirubin, UA: NEGATIVE
Blood, UA: NEGATIVE
Glucose, UA: NEGATIVE mg/dL
Ketones, POC UA: NEGATIVE mg/dL
Leukocytes, UA: NEGATIVE
Nitrite, UA: NEGATIVE
POC PROTEIN,UA: NEGATIVE
Spec Grav, UA: 1.015 (ref 1.010–1.025)
Urobilinogen, UA: 0.2 E.U./dL
pH, UA: 6.5 (ref 5.0–8.0)

## 2022-04-05 MED ORDER — PANTOPRAZOLE SODIUM 20 MG PO TBEC: 20.0000 mg | DELAYED_RELEASE_TABLET | Freq: Every day | ORAL | 1 refills | Status: DC

## 2022-04-05 NOTE — Assessment & Plan Note (Signed)
UA is negative for leukocytes and nitrates. Will start the patient on Protonix with instructions to avoid GERD-triggering foods such as tomatoes products, citrus, spicy foods, coffee, fatty foods, and  peppermint

## 2022-04-05 NOTE — Progress Notes (Signed)
Acute Office Visit  Subjective:     Patient ID: Sarah Coleman, female    DOB: [birth year], 71 y.o.   MRN: [id number]  Chief Complaint  Patient presents with   Abdominal Pain    Pt c/o abdominal pain and back pain onset of sx started on Thursday.     Abdominal Pain Pertinent negatives include no dysuria, fever, frequency, hematuria or weight loss.    The patient is in today with  c/o of epigastric pain. The onset of symptoms was on 03/31/22. She describes the pain as cramping. She denies changes in bowel habits. No fever or vomiting was reported. She occasionally has nausea. She voiced c/o of dyspepsia,  heartburn, and acid reflux and takes otc antacids. She reports eating an increased amount of fatty and citrus foods.  Review of Systems  Constitutional:  Negative for chills, fever and weight loss.  Gastrointestinal:  Positive for abdominal pain.  Genitourinary:  Negative for dysuria, frequency, hematuria and urgency.  Psychiatric/Behavioral:  Negative for memory loss. The patient does not have insomnia.         Objective:    BP 131/79   Pulse 88   Ht '5\' 6"'$  (1.676 m)   Wt 203 lb 1.9 oz (92.1 kg)   SpO2 96%   BMI 32.78 kg/m    Physical Exam HENT:     Head: Normocephalic.     Mouth/Throat:     Mouth: Mucous membranes are moist.  Cardiovascular:     Heart sounds: Normal heart sounds.  Pulmonary:     Effort: Pulmonary effort is normal.     Breath sounds: Normal breath sounds.  Abdominal:     General: Bowel sounds are normal. There is no abdominal bruit.     Palpations: There is no hepatomegaly or splenomegaly.     Tenderness: There is abdominal tenderness in the epigastric area. There is no right CVA tenderness or left CVA tenderness.  Neurological:     Mental Status: She is alert.     Results for orders placed or performed in visit on 04/05/22  POCT URINALYSIS DIP (CLINITEK)  Result Value Ref Range   Color, UA yellow yellow   Clarity, UA clear clear    Glucose, UA negative negative mg/dL   Bilirubin, UA negative negative   Ketones, POC UA negative negative mg/dL   Spec Grav, UA 1.015 1.010 - 1.025   Blood, UA negative negative   pH, UA 6.5 5.0 - 8.0   POC PROTEIN,UA negative negative, trace   Urobilinogen, UA 0.2 0.2 or 1.0 E.U./dL   Nitrite, UA Negative Negative   Leukocytes, UA Negative Negative        Assessment & Plan:   Problem List Items Addressed This Visit       Digestive   GERD (gastroesophageal reflux disease) - Primary    UA is negative for leukocytes and nitrates. Will start the patient on Protonix with instructions to avoid GERD-triggering foods such as tomatoes products, citrus, spicy foods, coffee, fatty foods, and  peppermint        Relevant Medications   pantoprazole (PROTONIX) 20 MG tablet   Other Visit Diagnoses     Generalized abdominal pain       Relevant Orders   POCT URINALYSIS DIP (CLINITEK) (Completed)       Meds ordered this encounter  Medications   pantoprazole (PROTONIX) 20 MG tablet    Sig: Take 1 tablet (20 mg total) by mouth daily.    Dispense:  60 tablet    Refill:  1    Return if symptoms worsen or fail to improve.  Alvira Monday, FNP

## 2022-04-05 NOTE — Patient Instructions (Signed)
I appreciate the opportunity to provide care to you today!    Follow up:  with Dr. Moshe Cipro as needed   Please pick up your prescription at the pharmacy  Lifestyle changes for GERD:  Avoiding foods that trigger symptoms -     Avoid certain foods and drinks, such as coffee, chocolate, onions, peppermint, spicy foods, carbonated beverages, citrus fruits, tomatoes, onions, Garlic, alcohol, Fatty foods (bacon, burgers, sausages, steak, fried foods, dairy food)    Recommended: High fiber foods, whole grain cereal, oatmeal, brown rice, root vegetables, non- citrus fruits, High protein foods, Health fats (avocados, olive oil, nuts and seeds)     Please continue to a heart-healthy diet and increase your physical activities. Try to exercise for 34mns at least three times a week.      It was a pleasure to see you and I look forward to continuing to work together on your health and well-being. Please do not hesitate to call the office if you need care or have questions about your care.   Have a wonderful day and week. With Gratitude, GAlvira MondayMSN, FNP-BC

## 2022-04-11 ENCOUNTER — Inpatient Hospital Stay: Payer: PPO | Attending: Hematology | Admitting: Hematology

## 2022-04-11 ENCOUNTER — Inpatient Hospital Stay: Payer: PPO

## 2022-04-11 ENCOUNTER — Ambulatory Visit (HOSPITAL_COMMUNITY)
Admission: RE | Admit: 2022-04-11 | Discharge: 2022-04-11 | Disposition: A | Discharge status: Home / Self Care | Payer: PPO | Source: Ambulatory Visit | Attending: Hematology | Admitting: Hematology

## 2022-04-11 DIAGNOSIS — Z808 Family history of malignant neoplasm of other organs or systems: Secondary | ICD-10-CM | POA: Insufficient documentation

## 2022-04-11 DIAGNOSIS — Z800 Family history of malignant neoplasm of digestive organs: Secondary | ICD-10-CM | POA: Diagnosis not present

## 2022-04-11 DIAGNOSIS — I10 Essential (primary) hypertension: Secondary | ICD-10-CM | POA: Insufficient documentation

## 2022-04-11 DIAGNOSIS — Z87891 Personal history of nicotine dependence: Secondary | ICD-10-CM | POA: Insufficient documentation

## 2022-04-11 DIAGNOSIS — D472 Monoclonal gammopathy: Secondary | ICD-10-CM | POA: Insufficient documentation

## 2022-04-11 DIAGNOSIS — Z806 Family history of leukemia: Secondary | ICD-10-CM | POA: Insufficient documentation

## 2022-04-11 DIAGNOSIS — N1832 Chronic kidney disease, stage 3b: Secondary | ICD-10-CM | POA: Insufficient documentation

## 2022-04-11 DIAGNOSIS — Z8041 Family history of malignant neoplasm of ovary: Secondary | ICD-10-CM | POA: Diagnosis not present

## 2022-04-11 DIAGNOSIS — R778 Other specified abnormalities of plasma proteins: Secondary | ICD-10-CM | POA: Insufficient documentation

## 2022-04-11 DIAGNOSIS — R779 Abnormality of plasma protein, unspecified: Secondary | ICD-10-CM | POA: Diagnosis not present

## 2022-04-11 DIAGNOSIS — Z8042 Family history of malignant neoplasm of prostate: Secondary | ICD-10-CM | POA: Diagnosis not present

## 2022-04-11 LAB — COMPREHENSIVE METABOLIC PANEL
ALT: 24 U/L (ref 0–44)
AST: 18 U/L (ref 15–41)
Albumin: 4.8 g/dL (ref 3.5–5.0)
Alkaline Phosphatase: 62 U/L (ref 38–126)
Anion gap: 10 (ref 5–15)
BUN: 21 mg/dL (ref 8–23)
CO2: 23 mmol/L (ref 22–32)
Calcium: 10.2 mg/dL (ref 8.9–10.3)
Chloride: 102 mmol/L (ref 98–111)
Creatinine, Ser: 1.21 mg/dL — ABNORMAL HIGH (ref 0.44–1.00)
GFR, Estimated: 48 mL/min — ABNORMAL LOW (ref >60)
Glucose, Bld: 114 mg/dL — ABNORMAL HIGH (ref 70–99)
Potassium: 4.5 mmol/L (ref 3.5–5.1)
Sodium: 135 mmol/L (ref 135–145)
Total Bilirubin: 0.5 mg/dL (ref 0.3–1.2)
Total Protein: 8.6 g/dL — ABNORMAL HIGH (ref 6.5–8.1)

## 2022-04-11 LAB — LACTATE DEHYDROGENASE: LDH: 139 U/L (ref 98–192)

## 2022-04-11 LAB — CBC WITH DIFFERENTIAL/PLATELET
Abs Immature Granulocytes: 0.06 10*3/uL (ref 0.00–0.07)
Basophils Absolute: 0 10*3/uL (ref 0.0–0.1)
Basophils Relative: 0 %
Eosinophils Absolute: 0.1 10*3/uL (ref 0.0–0.5)
Eosinophils Relative: 1 %
HCT: 39 % (ref 36.0–46.0)
Hemoglobin: 13.1 g/dL (ref 12.0–15.0)
Immature Granulocytes: 1 %
Lymphocytes Relative: 46 %
Lymphs Abs: 4.4 10*3/uL — ABNORMAL HIGH (ref 0.7–4.0)
MCH: 31.3 pg (ref 26.0–34.0)
MCHC: 33.6 g/dL (ref 30.0–36.0)
MCV: 93.1 fL (ref 80.0–100.0)
Monocytes Absolute: 0.5 10*3/uL (ref 0.1–1.0)
Monocytes Relative: 5 %
Neutro Abs: 4.5 10*3/uL (ref 1.7–7.7)
Neutrophils Relative %: 47 %
Platelets: 422 10*3/uL — ABNORMAL HIGH (ref 150–400)
RBC: 4.19 MIL/uL (ref 3.87–5.11)
RDW: 13.2 % (ref 11.5–15.5)
WBC: 9.5 10*3/uL (ref 4.0–10.5)
nRBC: 0 % (ref 0.0–0.2)

## 2022-04-11 NOTE — Patient Instructions (Signed)
[place] at [facility] Discharge Instructions   You were seen and examined today by Dr. Delton Coombes (Dr. Raliegh Ip). He is a blood specialist. He is seeing you today at the request of Dr. Theador Hawthorne because of an abnormal protein that was found in your blood by Dr. Theador Hawthorne.   We will obtain additional special blood work from you today to investigate this further.   Return as scheduled to review the results of these labs.    Thank you for choosing [place] at [facility] to provide your oncology and hematology care.  To afford each patient quality time with our provider, please arrive at least 15 minutes before your scheduled appointment time.   If you have a lab appointment with the [facility] please come in thru the Main Entrance and check in at the main information desk.  You need to re-schedule your appointment should you arrive 10 or more minutes late.  We strive to give you quality time with our providers, and arriving late affects you and other patients whose appointments are after yours.  Also, if you no show three or more times for appointments you may be dismissed from the clinic at the providers discretion.     Again, thank you for choosing [facility]  Our hope is that these requests will decrease the amount of time that you wait before being seen by our physicians.       _____________________________________________________________  Should you have questions after your visit to [facility], please contact our office at [phone] and follow the prompts.  Our office hours are 8:00 a.m. and 4:30 p.m. Monday - Friday.  Please note that voicemails left after 4:00 p.m. may not be returned until the following business day.  We are closed weekends and major holidays.  You do have access to a nurse 24-7, just call the main number to the clinic [phone] and do not press any options, hold on the line and a nurse will  answer the phone.    For prescription refill requests, have your pharmacy contact our office and allow 72 hours.    Due to Covid, you will need to wear a mask upon entering the hospital. If you do not have a mask, a mask will be given to you at the Main Entrance upon arrival. For doctor visits, patients may have 1 support person age 82 or older with them. For treatment visits, patients can not have anyone with them due to social distancing guidelines and our immunocompromised population.

## 2022-04-11 NOTE — Progress Notes (Signed)
CONSULT NOTE  Patient Care Team: Fayrene Helper, MD as PCP - General Branch, Alphonse Guild, MD as PCP - Cardiology (Cardiology) Franchot Gallo, MD as Consulting Physician (Urology) Harl Bowie Alphonse Guild, MD as Consulting Physician (Cardiology) Danie Binder, MD (Inactive) as Consulting Physician (Gastroenterology)  CHIEF COMPLAINTS/PURPOSE OF CONSULTATION:  Monoclonal gammopathy.  HISTORY OF PRESENTING ILLNESS:  Sarah Coleman 71 y.o. female is seen in consultation today at the request of Dr. Theador Hawthorne for further work-up and management of monoclonal gammopathy.  Work-up for CKD by Dr. Theador Hawthorne showed serum immunofixation with faint IgG kappa.  Free light chains were normal.  Patient reports 14 pound weight loss in the last 4 months due to decreased to eating.  No new onset bone pains.  No prior history of thrombosis.  Has on and off numbness in the left hand fourth and fifth fingers.  She worked at a [employer] prior to retirement and currently lives with her husband at home.  She is a non-smoker.  No major chemicals other than some exposure to rate/backstop.  Maternal aunt had leukemia, father had colon cancer and sister had uterine cancer.  Denies any recurrent infections.    MEDICAL HISTORY:  Past Medical History:  Diagnosis Date   Allergic rhinitis    Allergy    Anemia    Arthritis    GERD (gastroesophageal reflux disease) 2013   Hyperlipidemia    Hypertension    Metabolic syndrome X 3382   Morbid obesity ([place]) 2000   Obesity    Prediabetes 2010   Seizures ([place])    as child; unknown etiology.   Vertigo     SURGICAL HISTORY: Past Surgical History:  Procedure Laterality Date   BIOPSY  04/11/2017   Procedure: BIOPSY;  Surgeon: Danie Binder, MD;  [facility];  Service: Endoscopy;;  gastric bx's   COLONOSCOPY N/A 10/07/2016   Procedure: COLONOSCOPY;  Surgeon: Danie Binder, MD;  [facility];  Service: Endoscopy;  Laterality: N/A;  9:30 AM    COLONOSCOPY WITH PROPOFOL N/A 11/01/2016   Procedure: COLONOSCOPY WITH PROPOFOL;  Surgeon: Danie Binder, MD;  [facility];  Service: Endoscopy;  Laterality: N/A;  830    COLONOSCOPY WITH PROPOFOL N/A 01/23/2018   Procedure: COLONOSCOPY WITH PROPOFOL;  Surgeon: Danie Binder, MD;  [facility];  Service: Endoscopy;  Laterality: N/A;  2:00pm - pt knows to arrive at 10:15   COLONOSCOPY WITH PROPOFOL N/A 09/27/2021   Procedure: COLONOSCOPY WITH PROPOFOL;  Surgeon: Eloise Harman, DO;  [facility];  Service: Endoscopy;  Laterality: N/A;  8:30 / ASA 3   ESOPHAGOGASTRODUODENOSCOPY (EGD) WITH PROPOFOL N/A 04/11/2017   Procedure: ESOPHAGOGASTRODUODENOSCOPY (EGD) WITH PROPOFOL;  Surgeon: Danie Binder, MD;  [facility];  Service: Endoscopy;  Laterality: N/A;  10:45am   GERD     PARTIAL HYSTERECTOMY  1987   secondary to ovarian cyst    POLYPECTOMY  11/01/2016   Procedure: POLYPECTOMY;  Surgeon: Danie Binder, MD;  [facility];  Service: Endoscopy;;  colon   POLYPECTOMY  01/23/2018   Procedure: POLYPECTOMY;  Surgeon: Danie Binder, MD;  [facility];  Service: Endoscopy;;  ascending colon   SAVORY DILATION N/A 04/11/2017   Procedure: SAVORY DILATION;  Surgeon: Danie Binder, MD;  [facility];  Service: Endoscopy;  Laterality: N/A;   TUBAL LIGATION  1975    SOCIAL HISTORY: Social History  Socioeconomic History   Marital status: Married    Spouse name: Not on file   Number of children: 5   Years of education: Not on file   Highest education level: Not on file  Occupational History   Occupation: retired   Tobacco Use   Smoking status: Former    Packs/day: 1.00    Years: 3.00    Total pack years: 3.00    Types: Cigarettes    Quit date: 08/15/1974    Years since quitting: 47.6   Smokeless tobacco: Never  Vaping Use   Vaping Use: Never used  Substance and Sexual Activity   Alcohol use: No     Alcohol/week: 0.0 standard drinks of alcohol   Drug use: No   Sexual activity: Not Currently    Birth control/protection: Surgical    Comment: hyst  Other Topics Concern   Not on file  Social History Narrative   Not on file   Social Determinants of Health   Financial Resource Strain: Low Risk  (04/06/2021)   Overall Financial Resource Strain (CARDIA)    Difficulty of Paying Living Expenses: Not hard at all  Food Insecurity: No Food Insecurity (04/06/2021)   Hunger Vital Sign    Worried About Running Out of Food in the Last Year: Never true    [place] in the Last Year: Never true  Transportation Needs: No Transportation Needs (04/06/2021)   PRAPARE - Hydrologist (Medical): No    Lack of Transportation (Non-Medical): No  Physical Activity: Insufficiently Active (04/06/2021)   Exercise Vital Sign    Days of Exercise per Week: 3 days    Minutes of Exercise per Session: 30 min  Stress: No Stress Concern Present (04/06/2021)   [place]    Feeling of Stress : Not at all  Social Connections: Moderately Integrated (04/06/2021)   Social Connection and Isolation Panel [NHANES]    Frequency of Communication with Friends and Family: More than three times a week    Frequency of Social Gatherings with Friends and Family: More than three times a week    Attends Religious Services: More than 4 times per year    Active Member of [employer] or Organizations: No    Attends Archivist Meetings: Never    Marital Status: Married  Human resources officer Violence: Not At Risk (04/06/2021)   Humiliation, Afraid, Rape, and Kick questionnaire    Fear of Current or Ex-Partner: No    Emotionally Abused: No    Physically Abused: No    Sexually Abused: No    FAMILY HISTORY: Family History  Problem Relation Age of Onset   Heart attack Mother    Heart disease Mother 77       massive heart attack    Prostate cancer Father    Colon cancer Father 75   Hypertension Sister    Ovarian cancer Sister    Cancer Brother 3       oral    Hypertension Brother    Heart disease Brother     ALLERGIES:  is allergic to banana, cinnamon, ibuprofen, penicillins, and sweet potato.  MEDICATIONS:  Current Outpatient Medications  Medication Sig Dispense Refill   acetaminophen (TYLENOL) 500 MG tablet Take 1,000 mg by mouth every 8 (eight) hours as needed for moderate pain or headache.     amLODipine (NORVASC) 5 MG tablet Take 1 tablet (5 mg total) by mouth daily. [place]  tablet 3   aspirin EC 81 MG tablet Take 81 mg by mouth at bedtime.      azelastine (ASTELIN) 0.1 % nasal spray Use 2 sprays in each nostril every 12 hours for 1 week; After that, you may use 1 spray in each nostril twice a day as needed for allergies/congestion 30 mL 12   benzonatate (TESSALON PERLES) 100 MG capsule Take 1 capsule (100 mg total) by mouth 3 (three) times daily as needed for cough. 30 capsule 0   CALCIUM-MAGNESIUM-ZINC PO Take 1 tablet by mouth 2 (two) times daily.      carboxymethylcellulose (REFRESH PLUS) 0.5 % SOLN Place 1-2 drops into both eyes 3 (three) times daily as needed (for dry eyes).     chlorthalidone (HYGROTON) 25 MG tablet Take 1 tablet (25 mg total) by mouth daily. 90 tablet 3   estradiol (ESTRACE) 0.1 MG/GM vaginal cream USE 1 APPLICATORFUL VAGINALLY EVERY OTHER DAY AS NEEDED FOR  VAGINAL  DISCOMFORT 43 g 3   fenofibrate (TRICOR) 48 MG tablet Take 1 tablet (48 mg total) by mouth daily. 90 tablet 1   ferrous sulfate 325 (65 FE) MG tablet Take 325 mg by mouth daily with breakfast.     fluticasone (FLONASE) 50 MCG/ACT nasal spray Use 2 spray(s) in each nostril once daily 16 g 0   loratadine (CLARITIN) 10 MG tablet Take two tablets once daily for allergies 180 tablet 3   lovastatin (MEVACOR) 40 MG tablet TAKE 1 TABLET BY MOUTH AT BEDTIME 90 tablet 0   montelukast (SINGULAIR) 10 MG tablet TAKE 1 TABLET BY MOUTH AT  BEDTIME 90 tablet 0   pantoprazole (PROTONIX) 20 MG tablet Take 1 tablet (20 mg total) by mouth daily. 60 tablet 1   potassium chloride SA (KLOR-CON M) 20 MEQ tablet TAKE 2 TABLETS BY MOUTH THREE TIMES DAILY 540 tablet 0   solifenacin (VESICARE) 10 MG tablet Take 1 tablet (10 mg total) by mouth daily. (Patient taking differently: Take 10 mg by mouth 3 (three) times a week.) 90 tablet 3   spironolactone (ALDACTONE) 100 MG tablet Take 1 tablet (100 mg total) by mouth daily. 90 tablet 1   No current facility-administered medications for this visit.    REVIEW OF SYSTEMS:   Constitutional: Denies fevers, chills or abnormal night sweats Eyes: Denies blurriness of vision, double vision or watery eyes Ears, nose, mouth, throat, and face: Denies mucositis or sore throat Respiratory: Denies cough, dyspnea or wheezes Cardiovascular: Denies palpitation, chest discomfort or lower extremity swelling Gastrointestinal: Positive for constipation and occasional nausea. Skin: Denies abnormal skin rashes Lymphatics: Denies new lymphadenopathy or easy bruising Neurological: Positive for on and off numbness in the left hand fourth and fifth fingers. Behavioral/Psych: Mood is stable, no new changes  All other systems were reviewed with the patient and are negative.  PHYSICAL EXAMINATION: ECOG PERFORMANCE STATUS: 1 - Symptomatic but completely ambulatory  Vitals:   04/11/22 1310  BP: (!) 150/80  Pulse: 89  Resp: 18  Temp: 98.5 F (36.9 C)  SpO2: 100%   Filed Weights   04/11/22 1310  Weight: 206 lb 12.8 oz (93.8 kg)    GENERAL:alert, no distress and comfortable SKIN: skin color, texture, turgor are normal, no rashes or significant lesions EYES: normal, conjunctiva are pink and non-injected, sclera clear OROPHARYNX:no exudate, no erythema and lips, buccal mucosa, and tongue normal  NECK: supple, thyroid normal size, non-tender, without nodularity LYMPH:  no palpable lymphadenopathy in the cervical,  axillary or inguinal LUNGS:  clear to auscultation and percussion with normal breathing effort HEART: regular rate & rhythm and no murmurs and no lower extremity edema ABDOMEN:abdomen soft, non-tender and normal bowel sounds Musculoskeletal:no cyanosis of digits and no clubbing  PSYCH: alert & oriented x 3 with fluent speech NEURO: no focal motor/sensory deficits  LABORATORY DATA:  I have reviewed the data as listed Recent Results (from the past 2160 hour(s))  Lipid panel     Status: Abnormal   Collection Time: 03/04/22  9:44 AM  Result Value Ref Range   Cholesterol, Total 138 100 - 199 mg/dL   Triglycerides 167 (H) 0 - 149 mg/dL   HDL 35 (L) >39 mg/dL   VLDL Cholesterol Cal 29 5 - 40 mg/dL   LDL Chol Calc (NIH) 74 0 - 99 mg/dL   Chol/HDL Ratio 3.9 0.0 - 4.4 ratio    Comment:                                   T. Chol/HDL Ratio                                             Men  Women                               1/2 Avg.Risk  3.4    3.3                                   Avg.Risk  5.0    4.4                                2X Avg.Risk  9.6    7.1                                3X Avg.Risk 23.4   11.0   CMP14+EGFR     Status: Abnormal   Collection Time: 03/04/22  9:44 AM  Result Value Ref Range   Glucose 114 (H) 70 - 99 mg/dL   BUN 11 8 - 27 mg/dL   Creatinine, Ser 1.31 (H) 0.57 - 1.00 mg/dL   eGFR 43 (L) >59 mL/min/1.73   BUN/Creatinine Ratio 8 (L) 12 - 28   Sodium 132 (L) 134 - 144 mmol/L   Potassium 4.8 3.5 - 5.2 mmol/L   Chloride 101 96 - 106 mmol/L   CO2 20 20 - 29 mmol/L   Calcium 10.2 8.7 - 10.3 mg/dL   Total Protein 7.8 6.0 - 8.5 g/dL   Albumin 4.9 (H) 3.8 - 4.8 g/dL    Comment:               **Please note reference interval change**   Globulin, Total 2.9 1.5 - 4.5 g/dL   Albumin/Globulin Ratio 1.7 1.2 - 2.2   Bilirubin Total 0.3 0.0 - 1.2 mg/dL   Alkaline Phosphatase 93 44 - 121 IU/L   AST 21 0 - 40 IU/L   ALT 23 0 - 32 IU/L  POCT URINALYSIS DIP (CLINITEK)      Status: Normal  Collection Time: 04/05/22 10:26 AM  Result Value Ref Range   Color, UA yellow yellow   Clarity, UA clear clear   Glucose, UA negative negative mg/dL   Bilirubin, UA negative negative   Ketones, POC UA negative negative mg/dL   Spec Grav, UA 1.015 1.010 - 1.025   Blood, UA negative negative   pH, UA 6.5 5.0 - 8.0   POC PROTEIN,UA negative negative, trace   Urobilinogen, UA 0.2 0.2 or 1.0 E.U./dL   Nitrite, UA Negative Negative   Leukocytes, UA Negative Negative    RADIOGRAPHIC STUDIES: I have personally reviewed the radiological images as listed and agreed with the findings in the report. No results found.  ASSESSMENT:  1.  Monoclonal gammopathy: - Patient seen at the request of Dr. Theador Hawthorne for further work-up and management of MGUS. - Immunofixation on 01/17/2022: Faint IgG kappa monoclonal immunoglobulin detected.  SPEP showed faint restricted band migrating in the gamma globulin region.  Free light chain ratio was normal. - Reports 14 pound weight loss in the last 4 months due to decreased eating.  No new bone pains.  No prior history of thrombosis. - Left hand fourth and fifth fingers are numb on and off.  2.  Social/family history: - She lives at home with her husband.  She retired from working at [employer].  Non-smoker.  Uses lots of rate/backstop sprays.  No other chemical exposure. - Maternal aunt had leukemia.  Father had colon cancer.  Sister had uterine cancer.  PLAN:  1.  Monoclonal gammopathy: - We discussed the spectrum of plasma cell disorders and the prognosis. - Recommend repeating SPEP, immunofixation and free light chains along with LDH and beta-2 microglobulin.  If there is any significant abnormalities, consider 24-hour urine with total protein, UPEP, urine free light chains. - Recommend doing a skeletal survey. - RTC 2 to 3 weeks to discuss results.   All questions were answered. The patient knows to call the clinic with any  problems, questions or concerns.     Derek Jack, MD 04/11/22 2:08 PM

## 2022-04-12 ENCOUNTER — Encounter (HOSPITAL_COMMUNITY): Payer: Self-pay

## 2022-04-12 ENCOUNTER — Ambulatory Visit (HOSPITAL_COMMUNITY)
Admission: RE | Admit: 2022-04-12 | Discharge: 2022-04-12 | Disposition: A | Discharge status: Home / Self Care | Payer: PPO | Source: Ambulatory Visit | Attending: Nephrology | Admitting: Nephrology

## 2022-04-12 DIAGNOSIS — N189 Chronic kidney disease, unspecified: Secondary | ICD-10-CM | POA: Diagnosis not present

## 2022-04-12 DIAGNOSIS — N1832 Chronic kidney disease, stage 3b: Secondary | ICD-10-CM

## 2022-04-12 DIAGNOSIS — D472 Monoclonal gammopathy: Secondary | ICD-10-CM | POA: Diagnosis not present

## 2022-04-12 HISTORY — DX: Disorder of kidney and ureter, unspecified: N28.9

## 2022-04-12 LAB — KAPPA/LAMBDA LIGHT CHAINS
Kappa free light chain: 22.9 mg/L — ABNORMAL HIGH (ref 3.3–19.4)
Kappa, lambda light chain ratio: 1.52 (ref 0.26–1.65)
Lambda free light chains: 15.1 mg/L (ref 5.7–26.3)

## 2022-04-12 LAB — BETA 2 MICROGLOBULIN, SERUM: Beta-2 Microglobulin: 2.1 mg/L (ref 0.6–2.4)

## 2022-04-12 MED ORDER — FUROSEMIDE 10 MG/ML IJ SOLN
INTRAMUSCULAR | Status: AC
  Administered 2022-04-12: 40 mg via INTRAVENOUS
  Filled 2022-04-12: qty 4

## 2022-04-12 MED ORDER — TECHNETIUM TC 99M MERTIATIDE
5.0000 | Freq: Once | INTRAVENOUS | Status: AC | PRN
  Administered 2022-04-12: 5.5 via INTRAVENOUS

## 2022-04-14 LAB — PROTEIN ELECTROPHORESIS, SERUM
A/G Ratio: 1.2 (ref 0.7–1.7)
Albumin ELP: 4.2 g/dL (ref 2.9–4.4)
Alpha-1-Globulin: 0.3 g/dL (ref 0.0–0.4)
Alpha-2-Globulin: 0.9 g/dL (ref 0.4–1.0)
Beta Globulin: 1.3 g/dL (ref 0.7–1.3)
Gamma Globulin: 1.1 g/dL (ref 0.4–1.8)
Globulin, Total: 3.6 g/dL (ref 2.2–3.9)
M-Spike, %: 0.4 g/dL — ABNORMAL HIGH
Total Protein ELP: 7.8 g/dL (ref 6.0–8.5)

## 2022-04-20 ENCOUNTER — Ambulatory Visit (INDEPENDENT_AMBULATORY_CARE_PROVIDER_SITE_OTHER): Payer: PPO

## 2022-04-20 DIAGNOSIS — Z0000 Encounter for general adult medical examination without abnormal findings: Secondary | ICD-10-CM

## 2022-04-20 NOTE — Progress Notes (Signed)
I connected with  [name] on 04/20/22 by a audio enabled telemedicine application and verified that I am speaking with the correct person using two identifiers.  Patient Location: Home  Provider Location: Home Office  I discussed the limitations of evaluation and management by telemedicine. The patient expressed understanding and agreed to proceed.  Subjective:   Sarah Coleman is a 71 y.o. female who presents for Medicare Annual (Subsequent) preventive examination.  Review of Systems     Cardiac Risk Factors include: dyslipidemia;hypertension;sedentary lifestyle;smoking/ tobacco exposure     Objective:    There were no vitals filed for this visit. There is no height or weight on file to calculate BMI.     04/11/2022    1:16 PM 09/27/2021    7:41 AM 04/06/2021    4:43 PM 04/03/2020   10:54 AM 11/29/2018    1:05 PM 01/16/2018   12:58 PM 11/27/2017   11:20 AM  Advanced Directives  Does Patient Have a Medical Advance Directive? No Yes Yes Yes Yes No Yes  Type of Corporate treasurer of [place];Living will [place];Living will  Living will    Does patient want to make changes to medical advance directive?       No - Patient declined  Copy of [place] in Chart?  No - copy requested       Would patient like information on creating a medical advance directive? No - Patient declined     No - Patient declined     Current Medications (verified) Outpatient Encounter Medications as of 04/20/2022  Medication Sig   acetaminophen (TYLENOL) 500 MG tablet Take 1,000 mg by mouth every 8 (eight) hours as needed for moderate pain or headache.   amLODipine (NORVASC) 5 MG tablet Take 1 tablet (5 mg total) by mouth daily.   aspirin EC 81 MG tablet Take 81 mg by mouth at bedtime.    azelastine (ASTELIN) 0.1 % nasal spray Use 2 sprays in each nostril every 12 hours for 1 week; After that, you may use 1 spray in each nostril twice a day as  needed for allergies/congestion   benzonatate (TESSALON PERLES) 100 MG capsule Take 1 capsule (100 mg total) by mouth 3 (three) times daily as needed for cough.   CALCIUM-MAGNESIUM-ZINC PO Take 1 tablet by mouth 2 (two) times daily.    carboxymethylcellulose (REFRESH PLUS) 0.5 % SOLN Place 1-2 drops into both eyes 3 (three) times daily as needed (for dry eyes).   chlorthalidone (HYGROTON) 25 MG tablet Take 1 tablet (25 mg total) by mouth daily.   estradiol (ESTRACE) 0.1 MG/GM vaginal cream USE 1 APPLICATORFUL VAGINALLY EVERY OTHER DAY AS NEEDED FOR  VAGINAL  DISCOMFORT   fenofibrate (TRICOR) 48 MG tablet Take 1 tablet (48 mg total) by mouth daily.   ferrous sulfate 325 (65 FE) MG tablet Take 325 mg by mouth daily with breakfast.   fluticasone (FLONASE) 50 MCG/ACT nasal spray Use 2 spray(s) in each nostril once daily   loratadine (CLARITIN) 10 MG tablet Take two tablets once daily for allergies   lovastatin (MEVACOR) 40 MG tablet TAKE 1 TABLET BY MOUTH AT BEDTIME   montelukast (SINGULAIR) 10 MG tablet TAKE 1 TABLET BY MOUTH AT BEDTIME   pantoprazole (PROTONIX) 20 MG tablet Take 1 tablet (20 mg total) by mouth daily.   potassium chloride SA (KLOR-CON M) 20 MEQ tablet TAKE 2 TABLETS BY MOUTH THREE TIMES DAILY   solifenacin (VESICARE)  10 MG tablet Take 1 tablet (10 mg total) by mouth daily. (Patient taking differently: Take 10 mg by mouth 3 (three) times a week.)   spironolactone (ALDACTONE) 100 MG tablet Take 1 tablet (100 mg total) by mouth daily.   No facility-administered encounter medications on file as of 04/20/2022.    Allergies (verified) Banana, Cinnamon, Ibuprofen, Other, Penicillins, and Sweet potato   History: Past Medical History:  Diagnosis Date   Allergic rhinitis    Allergy    Anemia    Arthritis    GERD (gastroesophageal reflux disease) 2013   Hyperlipidemia    Hypertension    Metabolic syndrome X 1517   Morbid obesity ([place]) 2000   Obesity    Prediabetes 2010   Renal  insufficiency    Seizures ([place])    as child; unknown etiology.   Vertigo    Past Surgical History:  Procedure Laterality Date   BIOPSY  04/11/2017   Procedure: BIOPSY;  Surgeon: Danie Binder, MD;  [facility];  Service: Endoscopy;;  gastric bx's   COLONOSCOPY N/A 10/07/2016   Procedure: COLONOSCOPY;  Surgeon: Danie Binder, MD;  [facility];  Service: Endoscopy;  Laterality: N/A;  9:30 AM   COLONOSCOPY WITH PROPOFOL N/A 11/01/2016   Procedure: COLONOSCOPY WITH PROPOFOL;  Surgeon: Danie Binder, MD;  [facility];  Service: Endoscopy;  Laterality: N/A;  830    COLONOSCOPY WITH PROPOFOL N/A 01/23/2018   Procedure: COLONOSCOPY WITH PROPOFOL;  Surgeon: Danie Binder, MD;  [facility];  Service: Endoscopy;  Laterality: N/A;  2:00pm - pt knows to arrive at 10:15   COLONOSCOPY WITH PROPOFOL N/A 09/27/2021   Procedure: COLONOSCOPY WITH PROPOFOL;  Surgeon: Eloise Harman, DO;  [facility];  Service: Endoscopy;  Laterality: N/A;  8:30 / ASA 3   ESOPHAGOGASTRODUODENOSCOPY (EGD) WITH PROPOFOL N/A 04/11/2017   Procedure: ESOPHAGOGASTRODUODENOSCOPY (EGD) WITH PROPOFOL;  Surgeon: Danie Binder, MD;  [facility];  Service: Endoscopy;  Laterality: N/A;  10:45am   GERD     PARTIAL HYSTERECTOMY  1987   secondary to ovarian cyst    POLYPECTOMY  11/01/2016   Procedure: POLYPECTOMY;  Surgeon: Danie Binder, MD;  [facility];  Service: Endoscopy;;  colon   POLYPECTOMY  01/23/2018   Procedure: POLYPECTOMY;  Surgeon: Danie Binder, MD;  [facility];  Service: Endoscopy;;  ascending colon   SAVORY DILATION N/A 04/11/2017   Procedure: SAVORY DILATION;  Surgeon: Danie Binder, MD;  [facility];  Service: Endoscopy;  Laterality: N/A;   TUBAL LIGATION  1975   Family History  Problem Relation Age of Onset   Heart attack Mother    Heart disease Mother 25       massive heart attack   Prostate cancer  Father    Colon cancer Father 52   Hypertension Sister    Ovarian cancer Sister    Cancer Brother 18       oral    Hypertension Brother    Heart disease Brother    Social History   Socioeconomic History   Marital status: Married    Spouse name: Not on file   Number of children: 5   Years of education: Not on file   Highest education level: Not on file  Occupational History   Occupation: retired   Tobacco Use   Smoking status: Former    Packs/day: 1.00  Years: 3.00    Total pack years: 3.00    Types: Cigarettes    Quit date: 08/15/1974    Years since quitting: 47.7   Smokeless tobacco: Never  Vaping Use   Vaping Use: Never used  Substance and Sexual Activity   Alcohol use: No    Alcohol/week: 0.0 standard drinks of alcohol   Drug use: No   Sexual activity: Not Currently    Birth control/protection: Surgical    Comment: hyst  Other Topics Concern   Not on file  Social History Narrative   Not on file   Social Determinants of Health   Financial Resource Strain: Low Risk  (04/20/2022)   Overall Financial Resource Strain (CARDIA)    Difficulty of Paying Living Expenses: Not hard at all  Food Insecurity: No Food Insecurity (04/20/2022)   Hunger Vital Sign    Worried About Running Out of Food in the Last Year: Never true    Ran Out of Food in the Last Year: Never true  Transportation Needs: No Transportation Needs (04/06/2021)   PRAPARE - Hydrologist (Medical): No    Lack of Transportation (Non-Medical): No  Physical Activity: Insufficiently Active (04/06/2021)   Exercise Vital Sign    Days of Exercise per Week: 3 days    Minutes of Exercise per Session: 30 min  Stress: No Stress Concern Present (04/06/2021)   [place]    Feeling of Stress : Not at all  Social Connections: Moderately Integrated (04/20/2022)   Social Connection and Isolation Panel [NHANES]    Frequency of  Communication with Friends and Family: More than three times a week    Frequency of Social Gatherings with Friends and Family: More than three times a week    Attends Religious Services: More than 4 times per year    Active Member of [employer] or Organizations: No    Attends Music therapist: Never    Marital Status: Married    Tobacco Counseling Counseling given: Not Answered   Clinical Intake:           Nutritional Status: BMI > 30  Obese  How often do you need to have someone help you when you read instructions, pamphlets, or other written materials from your doctor or pharmacy?: 1 - Never  [place] of Daily Living    04/20/2022    1:48 PM  In your present state of health, do you have any difficulty performing the following activities:  Hearing? 0  Vision? 0  Difficulty concentrating or making decisions? 0  Walking or climbing stairs? 0  Dressing or bathing? 0  Doing errands, shopping? 0  Preparing Food and eating ? N  Using the Toilet? N  In the past six months, have you accidently leaked urine? N  Do you have problems with loss of bowel control? N  Managing your Medications? N  Managing your Finances? N  Housekeeping or managing your Housekeeping? N    Patient Care Team: Fayrene Helper, MD as PCP - General Branch, Alphonse Guild, MD as PCP - Cardiology (Cardiology) Franchot Gallo, MD as Consulting Physician (Urology) Harl Bowie, Alphonse Guild, MD as Consulting Physician (Cardiology) Danie Binder, MD (Inactive) as Consulting Physician (Gastroenterology) Derek Jack, MD as Medical Oncologist (Hematology)  Indicate any recent Medical Services you may have received from other than [facility] providers in the past year (date may be  approximate).     Assessment:   This is a routine wellness examination for [place].  Hearing/Vision screen No results found.  Dietary issues and exercise activities discussed: Current  Exercise Habits: The patient does not participate in regular exercise at present   Goals Addressed   None    Depression Screen    04/05/2022   10:05 AM 06/29/2021    8:44 AM 04/27/2021    9:01 AM 04/06/2021    4:40 PM 03/01/2021    9:34 AM 12/25/2020   10:48 AM 11/18/2020    1:04 PM  PHQ 2/9 Scores  PHQ - 2 Score 0 0 0 0 0 0 0  PHQ- 9 Score  0         Fall Risk    04/20/2022    1:48 PM 04/05/2022   10:05 AM 03/10/2022    2:08 PM 12/02/2021    2:13 PM 08/27/2021    2:53 PM  [place] in the past year? 0 0 0 1 0  Number falls in past yr: 0 0 0 1 0  Injury with Fall? 0 0 0 0 0  Risk for fall due to :  No Fall Risks     Follow up  Falls evaluation completed       FALL RISK PREVENTION PERTAINING TO THE HOME:  Any stairs in or around the home? Yes  If so, are there any without handrails? Yes  Home free of loose throw rugs in walkways, pet beds, electrical cords, etc? No  Adequate lighting in your home to reduce risk of falls? No   ASSISTIVE DEVICES UTILIZED TO PREVENT FALLS:  Life alert? No  Use of a cane, walker or w/c? No  Grab bars in the bathroom? Yes  Shower chair or bench in shower? Yes  Elevated toilet seat or a handicapped toilet? No    Cognitive Function:        04/06/2021    4:45 PM 04/03/2020   10:56 AM 11/29/2018    1:08 PM 11/27/2017   11:21 AM 09/21/2016    3:06 PM  6CIT Screen  What Year? 0 points 0 points 0 points 0 points 0 points  What month? 0 points 0 points 0 points 0 points 0 points  What time? 0 points 0 points 0 points 0 points 0 points  Count back from 20 0 points 0 points 0 points 0 points 0 points  Months in reverse 0 points 0 points 0 points 0 points 0 points  Repeat phrase 2 points 0 points 2 points 2 points 0 points  Total Score 2 points 0 points 2 points 2 points 0 points    Immunizations Immunization History  Administered Date(s) Administered   Fluad Quad(high Dose 65+) 04/15/2019, 04/15/2020, 04/27/2021   Influenza Whole  05/08/2007   Influenza,inj,Quad PF,6+ Mos 04/23/2013, 07/28/2014, 05/14/2015, 04/28/2016, 05/09/2017, 05/31/2018   Moderna SARS-COV2 Booster Vaccination 06/26/2020, 01/22/2021, 05/31/2021   Moderna Sars-Covid-2 Vaccination 10/24/2019, 11/26/2019   Pneumococcal Conjugate-13 03/26/2014   Pneumococcal Polysaccharide-23 02/09/2016   Td 05/09/2006   Zoster Recombinat (Shingrix) 12/07/2021   Zoster, Live 11/01/2012    TDAP status: Up to date  Flu Vaccine status: Up to date  Pneumococcal vaccine status: Up to date  Covid-19 vaccine status: Completed vaccines  Qualifies for Shingles Vaccine? Yes   Zostavax completed No   Shingrix Completed?: Yes  Screening Tests Health Maintenance  Topic Date Due   TETANUS/TDAP  05/09/2016   COVID-19 Vaccine (3 - Moderna  risk series) 06/28/2021   Zoster Vaccines- Shingrix (2 of 2) 02/01/2022   INFLUENZA VACCINE  05/14/2022 (Originally 03/15/2022)   MAMMOGRAM  05/08/2023   COLONOSCOPY (Pts 45-53yr Insurance coverage will need to be confirmed)  09/28/2031   Pneumonia Vaccine 72 Years old  Completed   DEXA SCAN  Completed   Hepatitis C Screening  Completed   HPV VACCINES  Aged Out    Health Maintenance  Health Maintenance Due  Topic Date Due   TETANUS/TDAP  05/09/2016   COVID-19 Vaccine (3 - Moderna risk series) 06/28/2021   Zoster Vaccines- Shingrix (2 of 2) 02/01/2022    Colorectal cancer screening: Type of screening: Colonoscopy. Completed 2023. Repeat every 5 years  Mammogram status: Completed 2022. Repeat every year  Bone Density status: Completed 2021. Results reflect: Bone density results: OSTEOPENIA. Repeat every 3 years.  Lung Cancer Screening: (Low Dose CT Chest recommended if Age 72-80years, 30 pack-year currently smoking OR have quit w/in 15years.) does not qualify.   Lung Cancer Screening Referral: na  Additional Screening:  Hepatitis C Screening: does qualify; Completed   Vision Screening: Recommended annual  ophthalmology exams for early detection of glaucoma and other disorders of the eye. Is the patient up to date with their annual eye exam?  Yes  Who is the provider or what is the name of the office in which the patient attends annual eye exams? [facility] [place]  If pt is not established with a provider, would they like to be referred to a provider to establish care? No .   Dental Screening: Recommended annual dental exams for proper oral hygiene  Community Resource Referral / Chronic Care Management: CRR required this visit?  No   CCM required this visit?  No      Plan:     I have personally reviewed and noted the following in the patient's chart:   Medical and social history Use of alcohol, tobacco or illicit drugs  Current medications and supplements including opioid prescriptions. Patient is not currently taking opioid prescriptions. Functional ability and status Nutritional status Physical activity Advanced directives List of other physicians Hospitalizations, surgeries, and ER visits in previous 12 months Vitals Screenings to include cognitive, depression, and falls Referrals and appointments  In addition, I have reviewed and discussed with patient certain preventive protocols, quality metrics, and best practice recommendations. A written personalized care plan for preventive services as well as general preventive health recommendations were provided to patient.     BEual Fines LPN   93/09/9922  Nurse Notes: Schedule your next wellness visit for 1 year at checkout

## 2022-04-20 NOTE — Patient Instructions (Signed)
  Ms. Slaght , Thank you for taking time to come for your Medicare Wellness Visit. I appreciate your ongoing commitment to your health goals. Please review the following plan we discussed and let me know if I can assist you in the future.   These are the goals we discussed:  Goals      Blood Pressure < 140/90     Goal less than 140/90     Exercise 3x per week (30 min per time)     Recommend starting an exercise program 3 times a week at least 30 minutes at a time.     Increase water intake        This is a list of the screening recommended for you and due dates:  Health Maintenance  Topic Date Due   Tetanus Vaccine  05/09/2016   COVID-19 Vaccine (3 - Moderna risk series) 06/28/2021   Zoster (Shingles) Vaccine (2 of 2) 02/01/2022   Flu Shot  05/14/2022*   Mammogram  05/08/2023   Colon Cancer Screening  09/28/2031   Pneumonia Vaccine  Completed   DEXA scan (bone density measurement)  Completed   Hepatitis C Screening: USPSTF Recommendation to screen - Ages 79-79 yo.  Completed   HPV Vaccine  Aged Out  *Topic was postponed. The date shown is not the original due date.

## 2022-04-24 NOTE — Progress Notes (Unsigned)
[place], [place] [address]   CLINIC:  Medical Oncology/Hematology  PCP:  Fayrene Helper, MD [address] [place] [address] [phone]   REASON FOR VISIT:  Follow-up for IgG kappa MGUS  PRIOR THERAPY: None  CURRENT THERAPY: Under work-up  INTERVAL HISTORY:  Sarah Coleman 71 y.o. female returns for routine follow-up of IgG kappa MGUS.  She was seen for initial consultation by Dr. Delton Coombes on 04/11/2022.  At today's visit, she reports feeling well.  She denies any changes in her symptoms or baseline health status since her visit with Dr. Delton Coombes 3 weeks ago.  She denies any new bone pain or recent fractures.  She has lost about 14 pounds in the past 4 months which she attributes to decreased appetite and cutting back on her food intake.  She denies any fevers, chills, or night sweats.  No new neurologic symptoms such as tinnitus, new-onset hearing loss, blurred vision, headache, or dizziness.  She reports some on and off numbness of left hand fourth and fifth fingers.  No thromboembolic events since her last visit. No new masses or lymphadenopathy per her report.   She has 70% energy and 70% appetite. She endorses that she is maintaining a stable weight.   REVIEW OF SYSTEMS:   Review of Systems  Constitutional:  Negative for appetite change, chills, diaphoresis, fatigue, fever and unexpected weight change.  HENT:   Negative for lump/mass and nosebleeds.   Eyes:  Negative for eye problems.  Respiratory:  Negative for cough, hemoptysis and shortness of breath.   Cardiovascular:  Negative for chest pain, leg swelling and palpitations.  Gastrointestinal:  Positive for constipation and nausea. Negative for abdominal pain, blood in stool, diarrhea and vomiting.  Genitourinary:  Negative for hematuria.   Skin: Negative.   Neurological:  Positive for dizziness. Negative for headaches and light-headedness.  Hematological:  Does  not bruise/bleed easily.      PAST MEDICAL/SURGICAL HISTORY:  Past Medical History:  Diagnosis Date   Allergic rhinitis    Allergy    Anemia    Arthritis    GERD (gastroesophageal reflux disease) 2013   Hyperlipidemia    Hypertension    Metabolic syndrome X 9892   Morbid obesity ([place]) 2000   Obesity    Prediabetes 2010   Renal insufficiency    Seizures ([place])    as child; unknown etiology.   Vertigo    Past Surgical History:  Procedure Laterality Date   BIOPSY  04/11/2017   Procedure: BIOPSY;  Surgeon: Danie Binder, MD;  [facility];  Service: Endoscopy;;  gastric bx's   COLONOSCOPY N/A 10/07/2016   Procedure: COLONOSCOPY;  Surgeon: Danie Binder, MD;  [facility];  Service: Endoscopy;  Laterality: N/A;  9:30 AM   COLONOSCOPY WITH PROPOFOL N/A 11/01/2016   Procedure: COLONOSCOPY WITH PROPOFOL;  Surgeon: Danie Binder, MD;  [facility];  Service: Endoscopy;  Laterality: N/A;  830    COLONOSCOPY WITH PROPOFOL N/A 01/23/2018   Procedure: COLONOSCOPY WITH PROPOFOL;  Surgeon: Danie Binder, MD;  [facility];  Service: Endoscopy;  Laterality: N/A;  2:00pm - pt knows to arrive at 10:15   COLONOSCOPY WITH PROPOFOL N/A 09/27/2021   Procedure: COLONOSCOPY WITH PROPOFOL;  Surgeon: Eloise Harman, DO;  [facility];  Service: Endoscopy;  Laterality: N/A;  8:30 / ASA 3   ESOPHAGOGASTRODUODENOSCOPY (EGD) WITH PROPOFOL N/A  04/11/2017   Procedure: ESOPHAGOGASTRODUODENOSCOPY (EGD) WITH PROPOFOL;  Surgeon: Danie Binder, MD;  [facility];  Service: Endoscopy;  Laterality: N/A;  10:45am   GERD     PARTIAL HYSTERECTOMY  1987   secondary to ovarian cyst    POLYPECTOMY  11/01/2016   Procedure: POLYPECTOMY;  Surgeon: Danie Binder, MD;  [facility];  Service: Endoscopy;;  colon   POLYPECTOMY  01/23/2018   Procedure: POLYPECTOMY;  Surgeon: Danie Binder, MD;  [facility];  Service: Endoscopy;;   ascending colon   SAVORY DILATION N/A 04/11/2017   Procedure: SAVORY DILATION;  Surgeon: Danie Binder, MD;  [facility];  Service: Endoscopy;  Laterality: N/A;   TUBAL LIGATION  1975     SOCIAL HISTORY:  Social History   Socioeconomic History   Marital status: Married    Spouse name: Not on file   Number of children: 5   Years of education: Not on file   Highest education level: Not on file  Occupational History   Occupation: retired   Tobacco Use   Smoking status: Former    Packs/day: 1.00    Years: 3.00    Total pack years: 3.00    Types: Cigarettes    Quit date: 08/15/1974    Years since quitting: 47.7   Smokeless tobacco: Never  Vaping Use   Vaping Use: Never used  Substance and Sexual Activity   Alcohol use: No    Alcohol/week: 0.0 standard drinks of alcohol   Drug use: No   Sexual activity: Not Currently    Birth control/protection: Surgical    Comment: hyst  Other Topics Concern   Not on file  Social History Narrative   Not on file   Social Determinants of Health   Financial Resource Strain: Low Risk  (04/20/2022)   Overall Financial Resource Strain (CARDIA)    Difficulty of Paying Living Expenses: Not hard at all  Food Insecurity: No Food Insecurity (04/20/2022)   Hunger Vital Sign    Worried About Running Out of Food in the Last Year: Never true    [place] in the Last Year: Never true  Transportation Needs: No Transportation Needs (04/06/2021)   PRAPARE - Hydrologist (Medical): No    Lack of Transportation (Non-Medical): No  Physical Activity: Insufficiently Active (04/06/2021)   Exercise Vital Sign    Days of Exercise per Week: 3 days    Minutes of Exercise per Session: 30 min  Stress: No Stress Concern Present (04/06/2021)   [place]    Feeling of Stress : Not at all  Social Connections: Moderately Integrated (04/20/2022)   Social  Connection and Isolation Panel [NHANES]    Frequency of Communication with Friends and Family: More than three times a week    Frequency of Social Gatherings with Friends and Family: More than three times a week    Attends Religious Services: More than 4 times per year    Active Member of [employer] or Organizations: No    Attends Archivist Meetings: Never    Marital Status: Married  Human resources officer Violence: Not At Risk (04/06/2021)   Humiliation, Afraid, Rape, and Kick questionnaire    Fear of Current or Ex-Partner: No    Emotionally Abused: No    Physically Abused: No    Sexually Abused: No    FAMILY HISTORY:  Family History  Problem Relation Age of Onset   Heart attack Mother    Heart disease Mother 59       massive heart attack   Prostate cancer Father    Colon cancer Father 102   Hypertension Sister    Ovarian cancer Sister    Cancer Brother 40       oral    Hypertension Brother    Heart disease Brother     CURRENT MEDICATIONS:  Outpatient Encounter Medications as of 04/25/2022  Medication Sig   acetaminophen (TYLENOL) 500 MG tablet Take 1,000 mg by mouth every 8 (eight) hours as needed for moderate pain or headache.   amLODipine (NORVASC) 5 MG tablet Take 1 tablet (5 mg total) by mouth daily.   aspirin EC 81 MG tablet Take 81 mg by mouth at bedtime.    azelastine (ASTELIN) 0.1 % nasal spray Use 2 sprays in each nostril every 12 hours for 1 week; After that, you may use 1 spray in each nostril twice a day as needed for allergies/congestion   benzonatate (TESSALON PERLES) 100 MG capsule Take 1 capsule (100 mg total) by mouth 3 (three) times daily as needed for cough.   CALCIUM-MAGNESIUM-ZINC PO Take 1 tablet by mouth 2 (two) times daily.    carboxymethylcellulose (REFRESH PLUS) 0.5 % SOLN Place 1-2 drops into both eyes 3 (three) times daily as needed (for dry eyes).   chlorthalidone (HYGROTON) 25 MG tablet Take 1 tablet (25 mg total) by mouth daily.   estradiol  (ESTRACE) 0.1 MG/GM vaginal cream USE 1 APPLICATORFUL VAGINALLY EVERY OTHER DAY AS NEEDED FOR  VAGINAL  DISCOMFORT   fenofibrate (TRICOR) 48 MG tablet Take 1 tablet (48 mg total) by mouth daily.   ferrous sulfate 325 (65 FE) MG tablet Take 325 mg by mouth daily with breakfast.   fluticasone (FLONASE) 50 MCG/ACT nasal spray Use 2 spray(s) in each nostril once daily   loratadine (CLARITIN) 10 MG tablet Take two tablets once daily for allergies   lovastatin (MEVACOR) 40 MG tablet TAKE 1 TABLET BY MOUTH AT BEDTIME   montelukast (SINGULAIR) 10 MG tablet TAKE 1 TABLET BY MOUTH AT BEDTIME   pantoprazole (PROTONIX) 20 MG tablet Take 1 tablet (20 mg total) by mouth daily.   potassium chloride SA (KLOR-CON M) 20 MEQ tablet TAKE 2 TABLETS BY MOUTH THREE TIMES DAILY   solifenacin (VESICARE) 10 MG tablet Take 1 tablet (10 mg total) by mouth daily. (Patient taking differently: Take 10 mg by mouth 3 (three) times a week.)   spironolactone (ALDACTONE) 100 MG tablet Take 1 tablet (100 mg total) by mouth daily.   No facility-administered encounter medications on file as of 04/25/2022.    ALLERGIES:  Allergies  Allergen Reactions   Banana Swelling   Cinnamon Swelling   Ibuprofen Swelling   Other     Orange   Penicillins Nausea And Vomiting    Has patient had a PCN reaction causing immediate rash, facial/tongue/throat swelling, SOB or lightheadedness with hypotension:No Has patient had a PCN reaction causing severe rash involving mucus membranes or skin necrosis:No Has patient had a PCN reaction that required hospitalization:No Has patient had a PCN reaction occurring within the last 10 years:No If all of the above answers are "NO", then may proceed with Cephalosporin use.    Sweet Potato Swelling     PHYSICAL EXAM:   ECOG PERFORMANCE STATUS: 0 - Asymptomatic  There were no vitals filed for this visit. There were no vitals filed for this  visit. Physical Exam Constitutional:      Appearance:  Normal appearance. She is obese.  HENT:     Head: Normocephalic and atraumatic.     Mouth/Throat:     Mouth: Mucous membranes are moist.  Eyes:     Extraocular Movements: Extraocular movements intact.     Pupils: Pupils are equal, round, and reactive to light.  Cardiovascular:     Rate and Rhythm: Normal rate and regular rhythm.     Pulses: Normal pulses.     Heart sounds: Normal heart sounds.  Pulmonary:     Effort: Pulmonary effort is normal.     Breath sounds: Normal breath sounds.  Abdominal:     General: Bowel sounds are normal.     Palpations: Abdomen is soft.     Tenderness: There is no abdominal tenderness.  Musculoskeletal:        General: No swelling.     Right lower leg: No edema.     Left lower leg: No edema.  Lymphadenopathy:     Cervical: No cervical adenopathy.  Skin:    General: Skin is warm and dry.  Neurological:     General: No focal deficit present.     Mental Status: She is alert and oriented to person, place, and time.  Psychiatric:        Mood and Affect: Mood normal.        Behavior: Behavior normal.      LABORATORY DATA:  I have reviewed the labs as listed.  CBC    Component Value Date/Time   WBC 9.5 04/11/2022 1348   RBC 4.19 04/11/2022 1348   HGB 13.1 04/11/2022 1348   HGB 12.5 04/26/2021 0931   HCT 39.0 04/11/2022 1348   HCT 36.1 04/26/2021 0931   PLT 422 (H) 04/11/2022 1348   PLT 321 03/11/2020 0912   MCV 93.1 04/11/2022 1348   MCV 90 04/26/2021 0931   MCH 31.3 04/11/2022 1348   MCHC 33.6 04/11/2022 1348   RDW 13.2 04/11/2022 1348   RDW 13.6 04/26/2021 0931   LYMPHSABS 4.4 (H) 04/11/2022 1348   LYMPHSABS 3.7 (H) 04/26/2021 0931   MONOABS 0.5 04/11/2022 1348   EOSABS 0.1 04/11/2022 1348   EOSABS 0.1 04/26/2021 0931   BASOSABS 0.0 04/11/2022 1348   BASOSABS 0.0 04/26/2021 0931      Latest Ref Rng & Units 04/11/2022    1:48 PM 03/04/2022    9:44 AM 11/29/2021    9:28 AM  CMP  Glucose 70 - 99 mg/dL 114  114  102   BUN 8 - 23  mg/dL '21  11  12   '$ Creatinine 0.44 - 1.00 mg/dL 1.21  1.31  1.20   Sodium 135 - 145 mmol/L 135  132  135   Potassium 3.5 - 5.1 mmol/L 4.5  4.8  4.9   Chloride 98 - 111 mmol/L 102  101  102   CO2 22 - 32 mmol/L '23  20  20   '$ Calcium 8.9 - 10.3 mg/dL 10.2  10.2  9.8   Total Protein 6.5 - 8.1 g/dL 8.6  7.8  7.1   Total Bilirubin 0.3 - 1.2 mg/dL 0.5  0.3  0.4   Alkaline Phos 38 - 126 U/L 62  93  68   AST 15 - 41 U/L '18  21  14   '$ ALT 0 - 44 U/L '24  23  13     '$ DIAGNOSTIC IMAGING:  I have independently reviewed the relevant imaging  and discussed with the patient.  ASSESSMENT & PLAN: 1.  IgG kappa MGUS - Work-up for CKD by Dr. Theador Hawthorne showed serum immunofixation with faint IgG kappa - Hematology work-up (04/11/2022): SPEP with M spike 0.4 Immunofixation confirms IgG kappa Mildly elevated kappa free light chains 22.9 with normal lambda free light chain 15.1 and normal ratio 1.52. Normal LDH and beta-2 microglobulin - CBC shows Hgb 13.1.  CMP with creatinine 1.21 (baseline CKD stage IIIa) and normal calcium 10.2. - Skeletal survey (04/11/2022) with multiple questionable small/faint lucencies throughout the axial and appendicular skeleton, described in more detail in radiology report - No new bone pain or B symptoms. - PLAN: We will check 24-hour urine with UPEP/UIFE - Due to questionable lucencies in axial/appendicular skeleton, will repeat skeletal survey and MGUS/myeloma panel in 3 months followed by office visit.  2.  Thrombocytosis and lymphocytosis, mild - CBC from 04/11/2022 shows mildly elevated platelets 422 and mild lymphocytosis 4.4 - PLAN: Recheck CBC/differential in 3 months.  Will check additional work-up if it remains abnormal.  3.  Social/family history: - She lives at home with her husband.  She retired from working at [employer].  Non-smoker.  Uses lots of rate/backstop sprays.  No other chemical exposure. - Maternal aunt had leukemia.  Father had colon cancer.   Sister had uterine cancer.    PLAN SUMMARY & DISPOSITION: 24-hour urine Labs and repeat skeletal survey in 3 months Office visit 1 week after labs/x-ray  All questions were answered. The patient knows to call the clinic with any problems, questions or concerns.  Medical decision making: Moderate  Time spent on visit: I spent 20 minutes counseling the patient face to face. The total time spent in the appointment was 30 minutes and more than 50% was on counseling.   Harriett Rush, PA-C  04/25/2022 9:06 AM

## 2022-04-25 ENCOUNTER — Inpatient Hospital Stay: Payer: PPO | Attending: Hematology | Admitting: Physician Assistant

## 2022-04-25 VITALS — BP 131/86 | HR 74 | Temp 99.1°F | Resp 18 | Ht 66.0 in | Wt 204.3 lb

## 2022-04-25 DIAGNOSIS — I129 Hypertensive chronic kidney disease with stage 1 through stage 4 chronic kidney disease, or unspecified chronic kidney disease: Secondary | ICD-10-CM | POA: Insufficient documentation

## 2022-04-25 DIAGNOSIS — Z8041 Family history of malignant neoplasm of ovary: Secondary | ICD-10-CM | POA: Diagnosis not present

## 2022-04-25 DIAGNOSIS — Z808 Family history of malignant neoplasm of other organs or systems: Secondary | ICD-10-CM | POA: Insufficient documentation

## 2022-04-25 DIAGNOSIS — Z800 Family history of malignant neoplasm of digestive organs: Secondary | ICD-10-CM | POA: Diagnosis not present

## 2022-04-25 DIAGNOSIS — N1831 Chronic kidney disease, stage 3a: Secondary | ICD-10-CM | POA: Diagnosis not present

## 2022-04-25 DIAGNOSIS — Z8042 Family history of malignant neoplasm of prostate: Secondary | ICD-10-CM | POA: Insufficient documentation

## 2022-04-25 DIAGNOSIS — Z806 Family history of leukemia: Secondary | ICD-10-CM | POA: Insufficient documentation

## 2022-04-25 DIAGNOSIS — Z87891 Personal history of nicotine dependence: Secondary | ICD-10-CM | POA: Diagnosis not present

## 2022-04-25 DIAGNOSIS — D72820 Lymphocytosis (symptomatic): Secondary | ICD-10-CM | POA: Diagnosis not present

## 2022-04-25 DIAGNOSIS — D472 Monoclonal gammopathy: Secondary | ICD-10-CM | POA: Insufficient documentation

## 2022-04-25 DIAGNOSIS — D75839 Thrombocytosis, unspecified: Secondary | ICD-10-CM | POA: Diagnosis not present

## 2022-04-25 NOTE — Patient Instructions (Signed)
[facility] [place] **   You were seen today by Tarri Abernethy PA-C for your MGUS.    MGUS (Monoclonal Gammopathy of Undetermined Significance) As we discussed, this is an increased amount of abnormal protein in your blood. This is a "precancerous" condition.  It may never cause problems, or it may eventually turn into a type of cancer known as multiple myeloma. At this time, you do not need any treatment, but we will continue to monitor your labs and x-rays.   OTHER TESTS: - 24-hour urine study - Labs in 3 months - X-rays in 3 months  MEDICATIONS: No changes to your home medications  FOLLOW-UP APPOINTMENT: Office visit in 3 months (1 week after labs/x-rays)  ** Thank you for trusting me with your healthcare!  I strive to provide all of my patients with quality care at each visit.  If you receive a survey for this visit, I would be so grateful to you for taking the time to provide feedback.  Thank you in advance!  ~ Wladyslaw Henrichs                   Dr. Derek Jack   &   Tarri Abernethy, PA-C   - - - - - - - - - - - - - - - - - -    Thank you for choosing [place] at [facility] to provide your oncology and hematology care.  To afford each patient quality time with our provider, please arrive at least 15 minutes before your scheduled appointment time.   If you have a lab appointment with the [facility] please come in thru the Main Entrance and check in at the main information desk.  You need to re-schedule your appointment should you arrive 10 or more minutes late.  We strive to give you quality time with our providers, and arriving late affects you and other patients whose appointments are after yours.  Also, if you no show three or more times for appointments you may be dismissed from the clinic at the providers discretion.     Again, thank you for choosing [facility].  Our hope is that these requests will decrease the amount of time that you wait before being seen by our physicians.       _____________________________________________________________  Should you have questions after your visit to [facility], please contact our office at [phone] and follow the prompts.  Our office hours are 8:00 a.m. and 4:30 p.m. Monday - Friday.  Please note that voicemails left after 4:00 p.m. may not be returned until the following business day.  We are closed weekends and major holidays.  You do have access to a nurse 24-7, just call the main number to the clinic [phone] and do not press any options, hold on the line and a nurse will answer the phone.    For prescription refill requests, have your pharmacy contact our office and allow 72 hours.

## 2022-04-27 ENCOUNTER — Other Ambulatory Visit: Payer: Self-pay

## 2022-04-27 ENCOUNTER — Other Ambulatory Visit: Payer: Self-pay | Admitting: *Deleted

## 2022-04-27 DIAGNOSIS — D472 Monoclonal gammopathy: Secondary | ICD-10-CM

## 2022-04-27 DIAGNOSIS — I5032 Chronic diastolic (congestive) heart failure: Secondary | ICD-10-CM | POA: Diagnosis not present

## 2022-04-27 DIAGNOSIS — D75839 Thrombocytosis, unspecified: Secondary | ICD-10-CM | POA: Diagnosis not present

## 2022-04-27 DIAGNOSIS — I129 Hypertensive chronic kidney disease with stage 1 through stage 4 chronic kidney disease, or unspecified chronic kidney disease: Secondary | ICD-10-CM | POA: Diagnosis not present

## 2022-04-27 DIAGNOSIS — E611 Iron deficiency: Secondary | ICD-10-CM | POA: Diagnosis not present

## 2022-04-27 DIAGNOSIS — N1831 Chronic kidney disease, stage 3a: Secondary | ICD-10-CM | POA: Diagnosis not present

## 2022-04-27 LAB — IMMUNOFIXATION ELECTROPHORESIS
IgA: 96 mg/dL (ref 64–422)
IgG (Immunoglobin G), Serum: 1135 mg/dL (ref 586–1602)
IgM (Immunoglobulin M), Srm: 114 mg/dL (ref 26–217)
Total Protein ELP: 7.6 g/dL (ref 6.0–8.5)

## 2022-05-03 LAB — UPEP/UIFE/LIGHT CHAINS/TP, 24-HR UR
% BETA, Urine: 0 %
ALPHA 1 URINE: 0 %
Albumin, U: 100 %
Alpha 2, Urine: 0 %
Free Kappa Lt Chains,Ur: 1.71 mg/L (ref 1.17–86.46)
Free Kappa/Lambda Ratio: >2.48 (ref 1.83–14.26)
Free Lambda Lt Chains,Ur: <0.69 mg/L (ref 0.27–15.21)
GAMMA GLOBULIN URINE: 0 %
Total Protein, Urine-Ur/day: 120 mg/24 hr (ref 30–150)
Total Protein, Urine: 4.6 mg/dL
Total Volume: 2600

## 2022-05-03 LAB — UIFE/LIGHT CHAINS/TP QN, 24-HR UR
FR KAPPA LT CH,24HR: 4.37 mg/24 hr
FR LAMBDA LT CH,24HR: <1.79 mg/24 hr
Free Kappa Lt Chains,Ur: 1.68 mg/L (ref 1.17–86.46)
Free Kappa/Lambda Ratio: >2.43 (ref 1.83–14.26)
Free Lambda Lt Chains,Ur: <0.69 mg/L (ref 0.27–15.21)
Total Protein, Urine-Ur/day: 127 mg/24 hr (ref 30–150)
Total Protein, Urine: 4.9 mg/dL
Total Volume: 2600

## 2022-05-04 ENCOUNTER — Other Ambulatory Visit: Payer: Self-pay | Admitting: Family Medicine

## 2022-05-04 ENCOUNTER — Ambulatory Visit (INDEPENDENT_AMBULATORY_CARE_PROVIDER_SITE_OTHER): Payer: PPO

## 2022-05-04 DIAGNOSIS — Z23 Encounter for immunization: Secondary | ICD-10-CM | POA: Diagnosis not present

## 2022-05-09 ENCOUNTER — Ambulatory Visit (HOSPITAL_COMMUNITY): Payer: PPO

## 2022-05-12 ENCOUNTER — Other Ambulatory Visit: Payer: Self-pay | Admitting: Family Medicine

## 2022-05-18 ENCOUNTER — Ambulatory Visit (INDEPENDENT_AMBULATORY_CARE_PROVIDER_SITE_OTHER): Payer: PPO | Admitting: Family Medicine

## 2022-05-18 ENCOUNTER — Telehealth: Payer: Self-pay | Admitting: Family Medicine

## 2022-05-18 DIAGNOSIS — R059 Cough, unspecified: Secondary | ICD-10-CM

## 2022-05-18 DIAGNOSIS — Z20822 Contact with and (suspected) exposure to covid-19: Secondary | ICD-10-CM | POA: Diagnosis not present

## 2022-05-18 LAB — POCT INFLUENZA A/B
Influenza A, POC: NEGATIVE
Influenza B, POC: NEGATIVE

## 2022-05-18 NOTE — Progress Notes (Signed)
Virtual Visit via Telephone Note  I connected with Sarah Coleman on 05/18/22 at  3:40 PM EDT by telephone and verified that I am speaking with the correct person using two identifiers.  Location: Patient: home Provider: office    I discussed the limitations, risks, security and privacy concerns of performing an evaluation and management service by telephone and the availability of in person appointments. I also discussed with the patient that there may be a patient responsible charge related to this service. The patient expressed understanding and agreed to proceed.   History of Present Illness: Fever , undocumented, warm, sore throat , body aches , cough clear sputum started yesterday, worse this am, poor appetite, scratchy throat and hoarse, positive covid contact negative home test today, symptoms started yeaterday   Observations/Objective:  There were no vitals taken for this visit. Good communication with no confusion and intact memory. Alert and oriented x 3 No signs of respiratory distress during speech  Assessment and Plan: Cough Acute onset cough and respiratory symptoms with covid exposure, test for influenza and covid, home test reportedly negative Fluids and  rest   Close exposure to COVID-19 virus covid testing repeated based on history will treat with paxlovid if positive   Follow Up Instructions:    I discussed the assessment and treatment plan with the patient. The patient was provided an opportunity to ask questions and all were answered. The patient agreed with the plan and demonstrated an understanding of the instructions.   The patient was advised to call back or seek an in-person evaluation if the symptoms worsen or if the condition fails to improve as anticipated.  I provided 8 minutes of non-face-to-face time during this encounter.   Tula Nakayama, MD

## 2022-05-18 NOTE — Patient Instructions (Signed)
Please come to the office today for testing for flu and covid  Use tylenol and or motrin for pain and fever  IF YOU DEVELOP SHORTNESS OF BREATH OR DIFFICULTY BREATHING   Drink a lot of WATER  KEEP FAMILY MEMBER WITH COVID ISOLATED AND WEAR A MASK  Thanks for choosing [facility], we consider it a privelige to serve you.

## 2022-05-18 NOTE — Telephone Encounter (Signed)
Error

## 2022-05-20 ENCOUNTER — Encounter: Payer: Self-pay | Admitting: Family Medicine

## 2022-05-20 DIAGNOSIS — Z20822 Contact with and (suspected) exposure to covid-19: Secondary | ICD-10-CM | POA: Insufficient documentation

## 2022-05-20 LAB — NOVEL CORONAVIRUS, NAA: SARS-CoV-2, NAA: NOT DETECTED

## 2022-05-20 NOTE — Assessment & Plan Note (Signed)
covid testing repeated based on history will treat with paxlovid if positive

## 2022-05-20 NOTE — Assessment & Plan Note (Signed)
Acute onset cough and respiratory symptoms with covid exposure, test for influenza and covid, home test reportedly negative Fluids and  rest

## 2022-05-25 ENCOUNTER — Encounter: Payer: Self-pay | Admitting: Family Medicine

## 2022-05-25 ENCOUNTER — Ambulatory Visit (INDEPENDENT_AMBULATORY_CARE_PROVIDER_SITE_OTHER): Payer: PPO | Admitting: Family Medicine

## 2022-05-25 VITALS — BP 128/82 | HR 80 | Ht 66.0 in | Wt 202.0 lb

## 2022-05-25 DIAGNOSIS — I10 Essential (primary) hypertension: Secondary | ICD-10-CM

## 2022-05-25 DIAGNOSIS — E785 Hyperlipidemia, unspecified: Secondary | ICD-10-CM

## 2022-05-25 DIAGNOSIS — Z0000 Encounter for general adult medical examination without abnormal findings: Secondary | ICD-10-CM | POA: Insufficient documentation

## 2022-05-25 DIAGNOSIS — R7303 Prediabetes: Secondary | ICD-10-CM

## 2022-05-25 NOTE — Assessment & Plan Note (Signed)

## 2022-05-25 NOTE — Patient Instructions (Addendum)
F/U in March, call if you need me sooner  Please reschedule mammogram at checkout  Nurse pls call pharmacy, w [facility] stop zetia   Fasting lipid, cmp and EGFr , HBA`1C first week in December  It is important that you exercise regularly at least 30 minutes 5 times a week. If you develop chest pain, have severe difficulty breathing, or feel very tired, stop exercising immediately and seek medical attention   Thanks for choosing [facility], we consider it a privelige to serve you.

## 2022-05-25 NOTE — Progress Notes (Signed)
    Sarah Coleman     MRN: [id number]      DOB: 09-03-1950  HPI: Patient is in for annual physical exam. No other health concerns are expressed or addressed at the visit. Recent labs,  are reviewed. Immunization is reviewed , and  updated if needed.   PE: BP 128/82   Pulse 80   Ht '5\' 6"'$  (1.676 m)   Wt 202 lb (91.6 kg)   SpO2 96%   BMI 32.60 kg/m   Pleasant  female, alert and oriented x 3, in no cardio-pulmonary distress. Afebrile. HEENT No facial trauma or asymetry. Sinuses non tender.  Extra occullar muscles intact.. External ears normal, . Neck: supple, no adenopathy,JVD or thyromegaly.No bruits.  Chest: Clear to ascultation bilaterally.No crackles or wheezes. Non tender to palpation    Cardiovascular system; Heart sounds normal,  S1 and  S2 ,no S3.  No murmur, or thrill. Apical beat not displaced Peripheral pulses normal.  Abdomen: Soft, non tender, .    Musculoskeletal exam: Full ROM of spine, hips , shoulders and knees. No deformity ,swelling or crepitus noted. No muscle wasting or atrophy.   Neurologic: Cranial nerves 2 to 12 intact. Power, tone ,sensation and reflexes normal throughout. No disturbance in gait. No tremor.  Skin: Intact, no ulceration, erythema , scaling or rash noted. Pigmentation normal throughout  Psych; Normal mood and affect. Judgement and concentration normal   Assessment & Plan:  Encounter for annual physical exam Annual exam as documented. Counseling done  re healthy lifestyle involving commitment to 150 minutes exercise per week, heart healthy diet, and attaining healthy weight.The importance of adequate sleep also discussed. Regular seat belt use and home safety, is also discussed. Changes in health habits are decided on by the patient with goals and time frames  set for achieving them. Immunization and cancer screening needs are specifically addressed at this visit.

## 2022-05-29 ENCOUNTER — Other Ambulatory Visit: Payer: Self-pay | Admitting: Family Medicine

## 2022-06-01 ENCOUNTER — Telehealth: Payer: Self-pay | Admitting: Family Medicine

## 2022-06-01 NOTE — Telephone Encounter (Signed)
Pt called stating she is still having next pain. States she is tylenol and it is not helping. Wants to know what to do?

## 2022-06-03 ENCOUNTER — Ambulatory Visit (INDEPENDENT_AMBULATORY_CARE_PROVIDER_SITE_OTHER): Payer: PPO | Admitting: Family Medicine

## 2022-06-03 ENCOUNTER — Encounter: Payer: Self-pay | Admitting: Family Medicine

## 2022-06-03 ENCOUNTER — Ambulatory Visit (HOSPITAL_COMMUNITY)
Admission: RE | Admit: 2022-06-03 | Discharge: 2022-06-03 | Disposition: A | Discharge status: Home / Self Care | Payer: PPO | Source: Ambulatory Visit | Attending: Family Medicine | Admitting: Family Medicine

## 2022-06-03 VITALS — BP 134/80 | HR 91 | Ht 66.0 in | Wt 202.1 lb

## 2022-06-03 DIAGNOSIS — I10 Essential (primary) hypertension: Secondary | ICD-10-CM

## 2022-06-03 DIAGNOSIS — M542 Cervicalgia: Secondary | ICD-10-CM | POA: Insufficient documentation

## 2022-06-03 MED ORDER — METHYLPREDNISOLONE ACETATE 80 MG/ML IJ SUSP
80.0000 mg | Freq: Once | INTRAMUSCULAR | Status: AC
  Administered 2022-06-03: 80 mg via INTRAMUSCULAR

## 2022-06-03 MED ORDER — PREDNISONE 20 MG PO TABS: 20.0000 mg | ORAL_TABLET | Freq: Two times a day (BID) | ORAL | 0 refills | Status: DC

## 2022-06-03 MED ORDER — CYCLOBENZAPRINE HCL 5 MG PO TABS: ORAL_TABLET | ORAL | 0 refills | Status: DC

## 2022-06-03 NOTE — Progress Notes (Signed)
   Sarah Coleman     MRN: [id number]      DOB: 1951/04/27   HPI [name] is here c/o right neck pain rated at 10 since 05/27/2022, no inciting trauma, feels tightness in neck also, pain disturbs sleep and ability to function. Old mRI over 10 years ago describes arthritis and disc disease dEnies any new weakness or numbness in upper extremity ROS Denies recent fever or chills. Denies sinus pressure, nasal congestion, ear pain or sore throat. Denies chest congestion, productive cough or wheezing. Denies chest pains, palpitations and leg swelling Denies abdominal pain, nausea, vomiting,diarrhea or constipation.   Denies dysuria, frequency, hesitancy or incontinence. Denies skin break down or rash.   PE  BP 134/80 (BP Location: Right Arm, Patient Position: Sitting, Cuff Size: Large)   Pulse 91   Ht '5\' 6"'$  (1.676 m)   Wt 202 lb 1.3 oz (91.7 kg)   SpO2 95%   BMI 32.62 kg/m   Patient alert and oriented and in no cardiopulmonary distress. Decreased rOM with right trapezius spasm .  Chest: Clear to auscultation bilaterally.  CVS: S1, S2 no murmurs, no S3.Regular rate.  ABD: Soft non tender.   Ext: No edema  MS: decreased  ROM spine, shoulders, hips and knees.  Skin: Intact, no ulcerations or rash noted.  Psych: Good eye contact, normal affect. Memory intact not anxious or depressed appearing.  CNS: CN 2-12 intact, power,  normal throughout.no focal deficits noted.   Assessment & Plan  Neck pain on right side Update x ray of neck, depo medrol 80 mg iM in office, short course of oral prednisone and muscle relaxant , if not improved , will need updated MRI , pt to call back  Essential hypertension Controlled, no change in medication   Morbid obesity ([place])  Patient re-educated about  the importance of commitment to a  minimum of 150 minutes of exercise per week as able.  The importance of healthy food choices with portion control discussed, as well as eating regularly  and within a 12 hour window most days. The need to choose "clean , green" food 50 to 75% of the time is discussed, as well as to make water the primary drink and set a goal of 64 ounces water daily.       06/03/2022   10:58 AM 05/25/2022   10:54 AM 04/25/2022    8:31 AM  Weight /BMI  Weight 202 lb 1.3 oz 202 lb 204 lb 4.8 oz  Height '5\' 6"'$  (1.676 m) '5\' 6"'$  (1.676 m) '5\' 6"'$  (1.676 m)  BMI 32.62 kg/m2 32.6 kg/m2 32.97 kg/m2

## 2022-06-03 NOTE — Patient Instructions (Signed)
Follow-up in January to reevaluate neck pain, call if you need me sooner.  X-ray of neck today.  Depo-Medrol 80 mg IM in the office today for neck pain.  Short course of prednisone and muscle relaxants as prescribed for pain.  If there is not significant improvement please call and we will organize MRI of your neck at that time.  Thanks for choosing [facility], we consider it a privelige to serve you.

## 2022-06-03 NOTE — Assessment & Plan Note (Signed)
  Patient re-educated about  the importance of commitment to a  minimum of 150 minutes of exercise per week as able.  The importance of healthy food choices with portion control discussed, as well as eating regularly and within a 12 hour window most days. The need to choose "clean , green" food 50 to 75% of the time is discussed, as well as to make water the primary drink and set a goal of 64 ounces water daily.       06/03/2022   10:58 AM 05/25/2022   10:54 AM 04/25/2022    8:31 AM  Weight /BMI  Weight 202 lb 1.3 oz 202 lb 204 lb 4.8 oz  Height '5\' 6"'$  (1.676 m) '5\' 6"'$  (1.676 m) '5\' 6"'$  (1.676 m)  BMI 32.62 kg/m2 32.6 kg/m2 32.97 kg/m2

## 2022-06-03 NOTE — Telephone Encounter (Signed)
Appt scheduled

## 2022-06-03 NOTE — Assessment & Plan Note (Signed)
Controlled, no change in medication  

## 2022-06-03 NOTE — Assessment & Plan Note (Signed)
Update x ray of neck, depo medrol 80 mg iM in office, short course of oral prednisone and muscle relaxant , if not improved , will need updated MRI , pt to call back

## 2022-06-06 ENCOUNTER — Ambulatory Visit (HOSPITAL_COMMUNITY)
Admission: RE | Admit: 2022-06-06 | Discharge: 2022-06-06 | Disposition: A | Discharge status: Home / Self Care | Payer: PPO | Source: Ambulatory Visit | Attending: Family Medicine | Admitting: Family Medicine

## 2022-06-06 DIAGNOSIS — Z1231 Encounter for screening mammogram for malignant neoplasm of breast: Secondary | ICD-10-CM | POA: Diagnosis not present

## 2022-06-23 ENCOUNTER — Other Ambulatory Visit: Payer: Self-pay | Admitting: Family Medicine

## 2022-07-01 ENCOUNTER — Other Ambulatory Visit: Payer: Self-pay | Admitting: Family Medicine

## 2022-07-04 ENCOUNTER — Other Ambulatory Visit: Payer: Self-pay

## 2022-07-04 MED ORDER — CYCLOBENZAPRINE HCL 5 MG PO TABS: ORAL_TABLET | ORAL | 0 refills | Status: DC

## 2022-07-08 ENCOUNTER — Other Ambulatory Visit: Payer: Self-pay | Admitting: Family Medicine

## 2022-07-22 ENCOUNTER — Other Ambulatory Visit: Payer: Self-pay | Admitting: Family Medicine

## 2022-07-22 DIAGNOSIS — I10 Essential (primary) hypertension: Secondary | ICD-10-CM | POA: Diagnosis not present

## 2022-07-22 DIAGNOSIS — E785 Hyperlipidemia, unspecified: Secondary | ICD-10-CM | POA: Diagnosis not present

## 2022-07-22 DIAGNOSIS — R7303 Prediabetes: Secondary | ICD-10-CM | POA: Diagnosis not present

## 2022-07-23 LAB — CMP14+EGFR
ALT: 12 IU/L (ref 0–32)
AST: 15 IU/L (ref 0–40)
Albumin/Globulin Ratio: 2.1 (ref 1.2–2.2)
Albumin: 5 g/dL — ABNORMAL HIGH (ref 3.8–4.8)
Alkaline Phosphatase: 61 IU/L (ref 44–121)
BUN/Creatinine Ratio: 11 — ABNORMAL LOW (ref 12–28)
BUN: 15 mg/dL (ref 8–27)
Bilirubin Total: 0.5 mg/dL (ref 0.0–1.2)
CO2: 20 mmol/L (ref 20–29)
Calcium: 10.2 mg/dL (ref 8.7–10.3)
Chloride: 100 mmol/L (ref 96–106)
Creatinine, Ser: 1.36 mg/dL — ABNORMAL HIGH (ref 0.57–1.00)
Globulin, Total: 2.4 g/dL (ref 1.5–4.5)
Glucose: 101 mg/dL — ABNORMAL HIGH (ref 70–99)
Potassium: 4.5 mmol/L (ref 3.5–5.2)
Sodium: 136 mmol/L (ref 134–144)
Total Protein: 7.4 g/dL (ref 6.0–8.5)
eGFR: 41 mL/min/{1.73_m2} — ABNORMAL LOW (ref >59)

## 2022-07-23 LAB — LIPID PANEL
Chol/HDL Ratio: 4 ratio (ref 0.0–4.4)
Cholesterol, Total: 170 mg/dL (ref 100–199)
HDL: 42 mg/dL (ref >39)
LDL Chol Calc (NIH): 94 mg/dL (ref 0–99)
Triglycerides: 201 mg/dL — ABNORMAL HIGH (ref 0–149)
VLDL Cholesterol Cal: 34 mg/dL (ref 5–40)

## 2022-07-23 LAB — HEMOGLOBIN A1C
Est. average glucose Bld gHb Est-mCnc: 128 mg/dL
Hgb A1c MFr Bld: 6.1 % — ABNORMAL HIGH (ref 4.8–5.6)

## 2022-07-26 ENCOUNTER — Ambulatory Visit (HOSPITAL_COMMUNITY)
Admission: RE | Admit: 2022-07-26 | Discharge: 2022-07-26 | Disposition: A | Discharge status: Home / Self Care | Payer: PPO | Source: Ambulatory Visit | Attending: Physician Assistant | Admitting: Physician Assistant

## 2022-07-26 ENCOUNTER — Inpatient Hospital Stay: Payer: PPO | Attending: Hematology

## 2022-07-26 DIAGNOSIS — N1832 Chronic kidney disease, stage 3b: Secondary | ICD-10-CM | POA: Diagnosis not present

## 2022-07-26 DIAGNOSIS — Z8042 Family history of malignant neoplasm of prostate: Secondary | ICD-10-CM | POA: Insufficient documentation

## 2022-07-26 DIAGNOSIS — Z806 Family history of leukemia: Secondary | ICD-10-CM | POA: Insufficient documentation

## 2022-07-26 DIAGNOSIS — D472 Monoclonal gammopathy: Secondary | ICD-10-CM

## 2022-07-26 DIAGNOSIS — D72820 Lymphocytosis (symptomatic): Secondary | ICD-10-CM | POA: Diagnosis not present

## 2022-07-26 DIAGNOSIS — Z87891 Personal history of nicotine dependence: Secondary | ICD-10-CM | POA: Insufficient documentation

## 2022-07-26 DIAGNOSIS — D75839 Thrombocytosis, unspecified: Secondary | ICD-10-CM | POA: Insufficient documentation

## 2022-07-26 DIAGNOSIS — Z8041 Family history of malignant neoplasm of ovary: Secondary | ICD-10-CM | POA: Diagnosis not present

## 2022-07-26 DIAGNOSIS — I129 Hypertensive chronic kidney disease with stage 1 through stage 4 chronic kidney disease, or unspecified chronic kidney disease: Secondary | ICD-10-CM | POA: Diagnosis not present

## 2022-07-26 DIAGNOSIS — Z800 Family history of malignant neoplasm of digestive organs: Secondary | ICD-10-CM | POA: Insufficient documentation

## 2022-07-26 LAB — COMPREHENSIVE METABOLIC PANEL
ALT: 18 U/L (ref 0–44)
AST: 20 U/L (ref 15–41)
Albumin: 4.8 g/dL (ref 3.5–5.0)
Alkaline Phosphatase: 51 U/L (ref 38–126)
Anion gap: 13 (ref 5–15)
BUN: 19 mg/dL (ref 8–23)
CO2: 23 mmol/L (ref 22–32)
Calcium: 10.7 mg/dL — ABNORMAL HIGH (ref 8.9–10.3)
Chloride: 101 mmol/L (ref 98–111)
Creatinine, Ser: 1.42 mg/dL — ABNORMAL HIGH (ref 0.44–1.00)
GFR, Estimated: 39 mL/min — ABNORMAL LOW (ref >60)
Glucose, Bld: 112 mg/dL — ABNORMAL HIGH (ref 70–99)
Potassium: 4.4 mmol/L (ref 3.5–5.1)
Sodium: 137 mmol/L (ref 135–145)
Total Bilirubin: 0.4 mg/dL (ref 0.3–1.2)
Total Protein: 8.3 g/dL — ABNORMAL HIGH (ref 6.5–8.1)

## 2022-07-26 LAB — CBC WITH DIFFERENTIAL/PLATELET
Abs Immature Granulocytes: 0.02 10*3/uL (ref 0.00–0.07)
Basophils Absolute: 0 10*3/uL (ref 0.0–0.1)
Basophils Relative: 0 %
Eosinophils Absolute: 0 10*3/uL (ref 0.0–0.5)
Eosinophils Relative: 1 %
HCT: 39.8 % (ref 36.0–46.0)
Hemoglobin: 13.2 g/dL (ref 12.0–15.0)
Immature Granulocytes: 0 %
Lymphocytes Relative: 47 %
Lymphs Abs: 3.5 10*3/uL (ref 0.7–4.0)
MCH: 31 pg (ref 26.0–34.0)
MCHC: 33.2 g/dL (ref 30.0–36.0)
MCV: 93.4 fL (ref 80.0–100.0)
Monocytes Absolute: 0.5 10*3/uL (ref 0.1–1.0)
Monocytes Relative: 7 %
Neutro Abs: 3.4 10*3/uL (ref 1.7–7.7)
Neutrophils Relative %: 45 %
Platelets: 277 10*3/uL (ref 150–400)
RBC: 4.26 MIL/uL (ref 3.87–5.11)
RDW: 13.2 % (ref 11.5–15.5)
WBC: 7.5 10*3/uL (ref 4.0–10.5)
nRBC: 0 % (ref 0.0–0.2)

## 2022-07-26 LAB — LACTATE DEHYDROGENASE: LDH: 111 U/L (ref 98–192)

## 2022-07-27 LAB — PROTEIN ELECTROPHORESIS, SERUM
A/G Ratio: 1.3 (ref 0.7–1.7)
Albumin ELP: 4.3 g/dL (ref 2.9–4.4)
Alpha-1-Globulin: 0.3 g/dL (ref 0.0–0.4)
Alpha-2-Globulin: 0.8 g/dL (ref 0.4–1.0)
Beta Globulin: 1.1 g/dL (ref 0.7–1.3)
Gamma Globulin: 1 g/dL (ref 0.4–1.8)
Globulin, Total: 3.2 g/dL (ref 2.2–3.9)
M-Spike, %: 0.3 g/dL — ABNORMAL HIGH
Total Protein ELP: 7.5 g/dL (ref 6.0–8.5)

## 2022-07-27 LAB — KAPPA/LAMBDA LIGHT CHAINS
Kappa free light chain: 18.7 mg/L (ref 3.3–19.4)
Kappa, lambda light chain ratio: 1.58 (ref 0.26–1.65)
Lambda free light chains: 11.8 mg/L (ref 5.7–26.3)

## 2022-08-02 ENCOUNTER — Inpatient Hospital Stay (HOSPITAL_BASED_OUTPATIENT_CLINIC_OR_DEPARTMENT_OTHER): Payer: PPO | Admitting: Physician Assistant

## 2022-08-02 ENCOUNTER — Other Ambulatory Visit: Payer: Self-pay | Admitting: Family Medicine

## 2022-08-02 ENCOUNTER — Other Ambulatory Visit: Payer: Self-pay

## 2022-08-02 VITALS — HR 93 | Temp 97.2°F | Resp 19 | Ht 66.0 in | Wt 200.4 lb

## 2022-08-02 DIAGNOSIS — K219 Gastro-esophageal reflux disease without esophagitis: Secondary | ICD-10-CM

## 2022-08-02 DIAGNOSIS — D472 Monoclonal gammopathy: Secondary | ICD-10-CM

## 2022-08-02 DIAGNOSIS — R778 Other specified abnormalities of plasma proteins: Secondary | ICD-10-CM

## 2022-08-02 NOTE — Patient Instructions (Addendum)
[place] at [place] **   You were seen today by Tarri Abernethy PA-C for your MGUS.    MGUS (Monoclonal Gammopathy of Undetermined Significance) As we discussed, this is an increased amount of abnormal protein in your blood. This is a "precancerous" condition.  It may never cause problems, or it may eventually turn into a type of cancer known as multiple myeloma. At this time, you do NOT have multiple myeloma, and you do not need any treatment.  We will continue to monitor your labs and x-rays.   MEDICATIONS: No changes to your home medications  FOLLOW-UP APPOINTMENT: Office visit in 6 months (1 week after labs)  ** Thank you for trusting me with your healthcare!  I strive to provide all of my patients with quality care at each visit.  If you receive a survey for this visit, I would be so grateful to you for taking the time to provide feedback.  Thank you in advance!  ~ Roniel Halloran                   Dr. Derek Jack   &   Tarri Abernethy, PA-C   - - - - - - - - - - - - - - - - - -    Thank you for choosing [place] at [facility] to provide your oncology and hematology care.  To afford each patient quality time with our provider, please arrive at least 15 minutes before your scheduled appointment time.   If you have a lab appointment with the [facility] please come in thru the Main Entrance and check in at the main information desk.  You need to re-schedule your appointment should you arrive 10 or more minutes late.  We strive to give you quality time with our providers, and arriving late affects you and other patients whose appointments are after yours.  Also, if you no show three or more times for appointments you may be dismissed from the clinic at the providers discretion.     Again, thank you for choosing [facility].  Our hope is that these requests will decrease  the amount of time that you wait before being seen by our physicians.       _____________________________________________________________  Should you have questions after your visit to [facility], please contact our office at [phone] and follow the prompts.  Our office hours are 8:00 a.m. and 4:30 p.m. Monday - Friday.  Please note that voicemails left after 4:00 p.m. may not be returned until the following business day.  We are closed weekends and major holidays.  You do have access to a nurse 24-7, just call the main number to the clinic [phone] and do not press any options, hold on the line and a nurse will answer the phone.    For prescription refill requests, have your pharmacy contact our office and allow 72 hours.

## 2022-08-02 NOTE — Progress Notes (Addendum)
[facility] [address], [place] [address]   CLINIC:  Medical Oncology/Hematology  PCP:  Kerri Perches, MD [address], [address] [place] [address] [phone]   REASON FOR VISIT:  Follow-up for IgG kappa MGUS  PRIOR THERAPY: None  CURRENT THERAPY: Under work-up  INTERVAL HISTORY:  Sarah Coleman 71 y.o. Coleman returns for routine follow-up of IgG kappa MGUS.  She was last seen by Rojelio Brenner PA-C on 04/25/2022.  At today's visit, she reports feeling well.  She denies any changes in her symptoms or baseline health status since her last visit 3 months ago.   She previously reported some intermittent numbness of the fourth and fifth fingers of her left hand, reports that this has resolved but now she has some numbness of the toes of her left foot, with the exception of her left big toe.  She denies any new bone pain or recent fractures.  Her weight has been stable since her last visit.  She reports occasional hot flashes, but denies any fevers, chills, or night sweats.   No new neurologic symptoms such as tinnitus, new-onset hearing loss, blurred vision, headache, or dizziness.  No thromboembolic events since her last visit.  No new masses or lymphadenopathy per her report.    She has 75% energy and 90% appetite.   REVIEW OF SYSTEMS:   Review of Systems  Constitutional:  Negative for appetite change, chills, diaphoresis, fatigue, fever and unexpected weight change.  HENT:   Negative for lump/mass and nosebleeds.   Eyes:  Negative for eye problems.  Respiratory:  Negative for cough, hemoptysis and shortness of breath.   Cardiovascular:  Negative for chest pain, leg swelling and palpitations.  Gastrointestinal:  Positive for constipation. Negative for abdominal pain, blood in stool, diarrhea, nausea and vomiting.  Genitourinary:  Negative for hematuria.   Skin: Negative.   Neurological:  Positive for headaches and numbness. Negative for  dizziness and light-headedness.  Hematological:  Does not bruise/bleed easily.      PAST MEDICAL/SURGICAL HISTORY:  Past Medical History:  Diagnosis Date   Allergic rhinitis    Allergy    Anemia    Arthritis    GERD (gastroesophageal reflux disease) 2013   Hyperlipidemia    Hypertension    Metabolic syndrome X 2000   Morbid obesity (HCC) 2000   Obesity    Prediabetes 2010   Renal insufficiency    Seizures (HCC)    as child; unknown etiology.   Vertigo    Past Surgical History:  Procedure Laterality Date   BIOPSY  04/11/2017   Procedure: BIOPSY;  Surgeon: West Bali, MD;  [facility];  Service: Endoscopy;;  gastric bx's   COLONOSCOPY N/A 10/07/2016   Procedure: COLONOSCOPY;  Surgeon: West Bali, MD;  [facility];  Service: Endoscopy;  Laterality: N/A;  9:30 AM   COLONOSCOPY WITH PROPOFOL N/A 11/01/2016   Procedure: COLONOSCOPY WITH PROPOFOL;  Surgeon: West Bali, MD;  [facility];  Service: Endoscopy;  Laterality: N/A;  830    COLONOSCOPY WITH PROPOFOL N/A 01/23/2018   Procedure: COLONOSCOPY WITH PROPOFOL;  Surgeon: West Bali, MD;  [facility];  Service: Endoscopy;  Laterality: N/A;  2:00pm - pt knows to arrive at 10:15   COLONOSCOPY WITH PROPOFOL N/A 09/27/2021   Procedure: COLONOSCOPY WITH PROPOFOL;  Surgeon: Lanelle Bal, DO;  [facility];  Service: Endoscopy;  Laterality: N/A;  8:30 / ASA 3   ESOPHAGOGASTRODUODENOSCOPY (EGD) WITH PROPOFOL N/A 04/11/2017   Procedure: ESOPHAGOGASTRODUODENOSCOPY (EGD) WITH PROPOFOL;  Surgeon: West Bali, MD;  [facility];  Service: Endoscopy;  Laterality: N/A;  10:45am   GERD     PARTIAL HYSTERECTOMY  1987   secondary to ovarian cyst    POLYPECTOMY  11/01/2016   Procedure: POLYPECTOMY;  Surgeon: West Bali, MD;  [facility];  Service: Endoscopy;;  colon   POLYPECTOMY  01/23/2018   Procedure: POLYPECTOMY;  Surgeon: West Bali,  MD;  [facility];  Service: Endoscopy;;  ascending colon   SAVORY DILATION N/A 04/11/2017   Procedure: SAVORY DILATION;  Surgeon: West Bali, MD;  [facility];  Service: Endoscopy;  Laterality: N/A;   TUBAL LIGATION  1975     SOCIAL HISTORY:  Social History   Socioeconomic History   Marital status: Married    Spouse name: Not on file   Number of children: 5   Years of education: Not on file   Highest education level: Not on file  Occupational History   Occupation: retired   Tobacco Use   Smoking status: Former    Packs/day: 1.00    Years: 3.00    Total pack years: 3.00    Types: Cigarettes    Quit date: 08/15/1974    Years since quitting: 47.9   Smokeless tobacco: Never  Vaping Use   Vaping Use: Never used  Substance and Sexual Activity   Alcohol use: No    Alcohol/week: 0.0 standard drinks of alcohol   Drug use: No   Sexual activity: Not Currently    Birth control/protection: Surgical    Comment: hyst  Other Topics Concern   Not on file  Social History Narrative   Not on file   Social Determinants of Health   Financial Resource Strain: Low Risk  (04/20/2022)   Overall Financial Resource Strain (CARDIA)    Difficulty of Paying Living Expenses: Not hard at all  Food Insecurity: No Food Insecurity (04/20/2022)   Hunger Vital Sign    Worried About Running Out of Food in the Last Year: Never true    Ran Out of Food in the Last Year: Never true  Transportation Needs: No Transportation Needs (04/06/2021)   PRAPARE - Administrator, Civil Service (Medical): No    Lack of Transportation (Non-Medical): No  Physical Activity: Insufficiently Active (04/06/2021)   Exercise Vital Sign    Days of Exercise per Week: 3 days    Minutes of Exercise per Session: 30 min  Stress: No Stress Concern Present (04/06/2021)   [employer] of Occupational Health - Occupational Stress Questionnaire    Feeling of Stress : Not at all  Social  Connections: Moderately Integrated (04/20/2022)   Social Connection and Isolation Panel [NHANES]    Frequency of Communication with Friends and Family: More than three times a week    Frequency of Social Gatherings with Friends and Family: More than three times a week    Attends Religious Services: More than 4 times per year    Active Member of [employer] or Organizations: No    Attends Banker Meetings: Never    Marital Status: Married  Catering manager Violence: Not At Risk (04/06/2021)   Humiliation, Afraid, Rape, and Kick questionnaire    Fear of Current or Ex-Partner: No    Emotionally Abused: No    Physically Abused: No  Sexually Abused: No    FAMILY HISTORY:  Family History  Problem Relation Age of Onset   Heart attack Mother    Heart disease Mother 20       massive heart attack   Prostate cancer Father    Colon cancer Father 41   Hypertension Sister    Ovarian cancer Sister    Cancer Brother 47       oral    Hypertension Brother    Heart disease Brother     CURRENT MEDICATIONS:  Outpatient Encounter Medications as of 08/02/2022  Medication Sig   acetaminophen (TYLENOL) 500 MG tablet Take 1,000 mg by mouth every 8 (eight) hours as needed for moderate pain or headache.   amLODipine (NORVASC) 5 MG tablet Take 1 tablet by mouth once daily   azelastine (ASTELIN) 0.1 % nasal spray Use 2 sprays in each nostril every 12 hours for 1 week; After that, you may use 1 spray in each nostril twice a day as needed for allergies/congestion   CALCIUM-MAGNESIUM-ZINC PO Take 1 tablet by mouth 2 (two) times daily.    carboxymethylcellulose (REFRESH PLUS) 0.5 % SOLN Place 1-2 drops into both eyes 3 (three) times daily as needed (for dry eyes).   chlorthalidone (HYGROTON) 25 MG tablet Take 1 tablet (25 mg total) by mouth daily.   cyclobenzaprine (FLEXERIL) 5 MG tablet Take one tablet by mouth twice daily as needed, for neck spasm   estradiol (ESTRACE) 0.1 MG/GM vaginal cream USE 1  APPLICATORFUL VAGINALLY EVERY OTHER DAY AS NEEDED FOR  VAGINAL  DISCOMFORT   fenofibrate (TRICOR) 48 MG tablet Take 1 tablet by mouth once daily   ferrous sulfate 325 (65 FE) MG tablet Take 325 mg by mouth daily with breakfast.   fluticasone (FLONASE) 50 MCG/ACT nasal spray Use 2 spray(s) in each nostril once daily   loratadine (CLARITIN) 10 MG tablet Take two tablets once daily for allergies   lovastatin (MEVACOR) 40 MG tablet TAKE 1 TABLET BY MOUTH AT BEDTIME   montelukast (SINGULAIR) 10 MG tablet TAKE 1 TABLET BY MOUTH AT BEDTIME   pantoprazole (PROTONIX) 20 MG tablet Take 1 tablet (20 mg total) by mouth daily.   potassium chloride SA (KLOR-CON M) 20 MEQ tablet TAKE 2 TABLETS BY MOUTH THREE TIMES DAILY   predniSONE (DELTASONE) 20 MG tablet Take 1 tablet (20 mg total) by mouth 2 (two) times daily with a meal.   solifenacin (VESICARE) 10 MG tablet Take 1 tablet (10 mg total) by mouth daily. (Patient taking differently: Take 10 mg by mouth 3 (three) times a week.)   spironolactone (ALDACTONE) 100 MG tablet Take 1 tablet (100 mg total) by mouth daily.   No facility-administered encounter medications on file as of 08/02/2022.    ALLERGIES:  Allergies  Allergen Reactions   Banana Swelling   Cinnamon Swelling   Ibuprofen Swelling   Orange Fruit [Citrus] Swelling    Pt states she found out that she was allergic to oranges around may 2023 has swelling reations.   Other     Orange   Penicillins Nausea And Vomiting    Has patient had a PCN reaction causing immediate rash, facial/tongue/throat swelling, SOB or lightheadedness with hypotension:No Has patient had a PCN reaction causing severe rash involving mucus membranes or skin necrosis:No Has patient had a PCN reaction that required hospitalization:No Has patient had a PCN reaction occurring within the last 10 years:No If all of the above answers are "NO", then may proceed with Cephalosporin use.  Sweet Potato Swelling     PHYSICAL  EXAM:   ECOG PERFORMANCE STATUS: 0 - Asymptomatic  There were no vitals filed for this visit. There were no vitals filed for this visit. Physical Exam Constitutional:      Appearance: Normal appearance. She is obese.  HENT:     Head: Normocephalic and atraumatic.     Mouth/Throat:     Mouth: Mucous membranes are moist.  Eyes:     Extraocular Movements: Extraocular movements intact.     Pupils: Pupils are equal, round, and reactive to light.  Cardiovascular:     Rate and Rhythm: Normal rate and regular rhythm.     Pulses: Normal pulses.     Heart sounds: Normal heart sounds.  Pulmonary:     Effort: Pulmonary effort is normal.     Breath sounds: Normal breath sounds.  Abdominal:     General: Bowel sounds are normal.     Palpations: Abdomen is soft.     Tenderness: There is no abdominal tenderness.  Musculoskeletal:        General: No swelling.     Right lower leg: No edema.     Left lower leg: No edema.  Lymphadenopathy:     Cervical: No cervical adenopathy.  Skin:    General: Skin is warm and dry.  Neurological:     General: No focal deficit present.     Mental Status: She is alert and oriented to person, place, and time.  Psychiatric:        Mood and Affect: Mood normal.        Behavior: Behavior normal.      LABORATORY DATA:  I have reviewed the labs as listed.  CBC    Component Value Date/Time   WBC 7.5 07/26/2022 1049   RBC 4.26 07/26/2022 1049   HGB 13.2 07/26/2022 1049   HGB 12.5 04/26/2021 0931   HCT 39.8 07/26/2022 1049   HCT 36.1 04/26/2021 0931   PLT 277 07/26/2022 1049   PLT 321 03/11/2020 0912   MCV 93.4 07/26/2022 1049   MCV 90 04/26/2021 0931   MCH 31.0 07/26/2022 1049   MCHC 33.2 07/26/2022 1049   RDW 13.2 07/26/2022 1049   RDW 13.6 04/26/2021 0931   LYMPHSABS 3.5 07/26/2022 1049   LYMPHSABS 3.7 (H) 04/26/2021 0931   MONOABS 0.5 07/26/2022 1049   EOSABS 0.0 07/26/2022 1049   EOSABS 0.1 04/26/2021 0931   BASOSABS 0.0 07/26/2022 1049    BASOSABS 0.0 04/26/2021 0931      Latest Ref Rng & Units 07/26/2022   10:49 AM 07/22/2022   10:47 AM 04/11/2022    1:48 PM  CMP  Glucose 70 - 99 mg/dL 161  096  045   BUN 8 - 23 mg/dL 19  15  21    Creatinine 0.44 - 1.00 mg/dL 4.09  8.11  9.14   Sodium 135 - 145 mmol/L 137  136  135   Potassium 3.5 - 5.1 mmol/L 4.4  4.5  4.5   Chloride 98 - 111 mmol/L 101  100  102   CO2 22 - 32 mmol/L 23  20  23    Calcium 8.9 - 10.3 mg/dL 78.2  95.6  21.3   Total Protein 6.5 - 8.1 g/dL 8.3  7.4  8.6   Total Bilirubin 0.3 - 1.2 mg/dL 0.4  0.5  0.5   Alkaline Phos 38 - 126 U/L 51  61  62   AST 15 - 41 U/L 20  15  18   ALT 0 - 44 U/L 18  12  24      DIAGNOSTIC IMAGING:  I have independently reviewed the relevant imaging and discussed with the patient.  ASSESSMENT & PLAN: 1.  IgG kappa MGUS - Work-up for CKD by Dr. Wolfgang Phoenix showed serum immunofixation with faint IgG kappa - Hematology work-up (04/11/2022): SPEP with M spike 0.4 Immunofixation confirms IgG kappa Mildly elevated kappa free light chains 22.9 with normal lambda free light chain 15.1 and normal ratio 1.52. Normal LDH and beta-2 microglobulin - 24-hour urine (04/27/2022): Normal urine immunofixation and urine protein levels.  No evidence of monoclonal protein in urine. - Skeletal survey (04/11/2022) with multiple questionable small/faint lucencies throughout the axial and appendicular skeleton, described in more detail in radiology report - Most recent labs (07/26/2022): M spike stable at 0.3. Kappa/lambda free light chains normal. Normal LDH. CMP with creatinine 1.42 (baseline CKD stage IIIa/B). Calcium mildly elevated at 10.7.  (Reports that she takes calcium supplements at home) Normal Hgb 13.2. - Repeat skeletal survey (07/26/2022): Stable multifocal lytic lesions (5 mm lucency of parietal bone, 7 mm lucency of right scapula, lucencies within the right hemipelvis are not well-visualized, but appears stable measuring up to 7 mm); no new  lesions identified - No new bone pain or B symptoms.  Intermittent numbness/tingling of her left foot second through fifth toes and left hand fourth and fifth fingers.  (She is nondiabetic). - PLAN: No evidence of myeloma at this time.  Previously noted small lytic lesions on skeletal survey are stable, which is reassuring. - Due to mild hypercalcemia, she has been instructed to stop taking her calcium supplement at home. - Will recheck her MGUS/myeloma labs in 6 months with office visit 1 week after. - Per discussion with Dr. Ellin Saba, we will check skeletal survey again in 6 months.  If stable at that time, we can go to annual skeletal surveys instead.  2.  Thrombocytosis and lymphocytosis, mild - CBC from 04/11/2022 shows mildly elevated platelets 422 and mild lymphocytosis 4.4 - Most recent CBC (07/26/2022) is normal, with platelets 277, absolute lymphocytes 3.5 - PLAN: We will continue active surveillance and consider additional workup if necessary  3.  Social/family history: - She lives at home with her husband.  She retired from working at [employer].  Non-smoker.  Uses lots of rate/backstop sprays.  No other chemical exposure. - Maternal aunt had leukemia.  Father had colon cancer.  Sister had uterine cancer.    PLAN SUMMARY: >> Labs in 6 months (CBC/D, CMP, LDH, SPEP, free light chains) PLUS skeletal survey (whole body x-ray) >> Office visit 1 week after labs   All questions were answered. The patient knows to call the clinic with any problems, questions or concerns.  Medical decision making: Moderate  Time spent on visit: I spent 15 minutes counseling the patient face to face. The total time spent in the appointment was 22 minutes and more than 50% was on counseling.   Carnella Guadalajara, PA-C  08/02/2022 11:27 AM

## 2022-08-03 ENCOUNTER — Other Ambulatory Visit: Payer: Self-pay | Admitting: Family Medicine

## 2022-08-03 NOTE — Addendum Note (Signed)
Addended by: Tarri Abernethy on: 08/03/2022 10:39 AM   Modules accepted: Orders

## 2022-08-04 ENCOUNTER — Other Ambulatory Visit: Payer: Self-pay | Admitting: Family Medicine

## 2022-08-09 ENCOUNTER — Other Ambulatory Visit: Payer: Self-pay | Admitting: Family Medicine

## 2022-08-16 ENCOUNTER — Ambulatory Visit: Payer: PPO | Admitting: Urology

## 2022-08-18 DIAGNOSIS — D472 Monoclonal gammopathy: Secondary | ICD-10-CM | POA: Diagnosis not present

## 2022-08-18 DIAGNOSIS — D75839 Thrombocytosis, unspecified: Secondary | ICD-10-CM | POA: Diagnosis not present

## 2022-08-18 DIAGNOSIS — N1831 Chronic kidney disease, stage 3a: Secondary | ICD-10-CM | POA: Diagnosis not present

## 2022-08-18 DIAGNOSIS — R82998 Other abnormal findings in urine: Secondary | ICD-10-CM | POA: Diagnosis not present

## 2022-08-18 DIAGNOSIS — E871 Hypo-osmolality and hyponatremia: Secondary | ICD-10-CM | POA: Diagnosis not present

## 2022-08-18 DIAGNOSIS — E611 Iron deficiency: Secondary | ICD-10-CM | POA: Diagnosis not present

## 2022-08-18 DIAGNOSIS — I5032 Chronic diastolic (congestive) heart failure: Secondary | ICD-10-CM | POA: Diagnosis not present

## 2022-08-18 DIAGNOSIS — I129 Hypertensive chronic kidney disease with stage 1 through stage 4 chronic kidney disease, or unspecified chronic kidney disease: Secondary | ICD-10-CM | POA: Diagnosis not present

## 2022-08-19 ENCOUNTER — Ambulatory Visit (INDEPENDENT_AMBULATORY_CARE_PROVIDER_SITE_OTHER): Payer: PPO | Admitting: Family Medicine

## 2022-08-19 ENCOUNTER — Encounter: Payer: Self-pay | Admitting: Family Medicine

## 2022-08-19 VITALS — BP 134/80 | HR 90 | Ht 66.0 in | Wt 201.0 lb

## 2022-08-19 DIAGNOSIS — I10 Essential (primary) hypertension: Secondary | ICD-10-CM

## 2022-08-19 DIAGNOSIS — R7303 Prediabetes: Secondary | ICD-10-CM | POA: Diagnosis not present

## 2022-08-19 DIAGNOSIS — E785 Hyperlipidemia, unspecified: Secondary | ICD-10-CM

## 2022-08-19 DIAGNOSIS — N393 Stress incontinence (female) (male): Secondary | ICD-10-CM | POA: Diagnosis not present

## 2022-08-19 DIAGNOSIS — J309 Allergic rhinitis, unspecified: Secondary | ICD-10-CM

## 2022-08-19 DIAGNOSIS — E559 Vitamin D deficiency, unspecified: Secondary | ICD-10-CM

## 2022-08-19 MED ORDER — CYCLOBENZAPRINE HCL 5 MG PO TABS: ORAL_TABLET | ORAL | 0 refills | Status: DC

## 2022-08-19 NOTE — Patient Instructions (Addendum)
F/U in April call if you need me sooner  HBA1C, fasting lipid  , cmp and EGFR, TSH and vit D 3 to 5 days before appt  It is important that you exercise regularly at least 30 minutes 5 times a week. If you develop chest pain, have severe difficulty breathing, or feel very tired, stop exercising immediately and seek medical attention   Think about what you will eat, plan ahead. Choose " clean, green, fresh or frozen" over canned, processed or packaged foods which are more sugary, salty and fatty. 70 to 75% of food eaten should be vegetables and fruit. Three meals at set times with snacks allowed between meals, but they must be fruit or vegetables. Aim to eat over a 12 hour period , example 7 am to 7 pm, and STOP after  your last meal of the day. Drink water,generally about 64 ounces per day, no other drink is as healthy. Fruit juice is best enjoyed in a healthy way, by EATING the fruit. Thanks for choosing [facility], we consider it a privelige to serve you.

## 2022-08-21 DIAGNOSIS — E559 Vitamin D deficiency, unspecified: Secondary | ICD-10-CM | POA: Insufficient documentation

## 2022-08-21 NOTE — Assessment & Plan Note (Signed)
Patient educated about the importance of limiting  Carbohydrate intake , the need to commit to daily physical activity for a minimum of 30 minutes , and to commit weight loss. The fact that changes in all these areas will reduce or eliminate all together the development of diabetes is stressed.      Latest Ref Rng & Units 07/26/2022   10:49 AM 07/22/2022   10:47 AM 04/11/2022    1:48 PM 03/04/2022    9:44 AM 11/29/2021    9:28 AM  Diabetic Labs  HbA1c 4.8 - 5.6 %  6.1    6.0   Chol 100 - 199 mg/dL  170   138  162   HDL >39 mg/dL  42   35  35   Calc LDL 0 - 99 mg/dL  94   74  74   Triglycerides 0 - 149 mg/dL  201   167  328   Creatinine 0.44 - 1.00 mg/dL 1.42  1.36  1.21  1.31  1.20       08/19/2022   11:37 AM 08/19/2022   11:04 AM 08/19/2022   10:56 AM 08/02/2022   10:01 AM 06/03/2022   10:59 AM 06/03/2022   10:58 AM 05/25/2022   11:24 AM  BP/Weight  Systolic BP 035 597 416  384 536 468  Diastolic BP 80 84 80  80 86 82  Wt. (Lbs)   201.04 200.4  202.08   BMI   32.45 kg/m2 32.35 kg/m2  32.62 kg/m2        No data to display          Updated lab needed at/ before next visit.

## 2022-08-21 NOTE — Assessment & Plan Note (Signed)
Controlled, no change in medication DASH diet and commitment to daily physical activity for a minimum of 30 minutes discussed and encouraged, as a part of hypertension management. The importance of attaining a healthy weight is also discussed.     08/19/2022   11:37 AM 08/19/2022   11:04 AM 08/19/2022   10:56 AM 08/02/2022   10:01 AM 06/03/2022   10:59 AM 06/03/2022   10:58 AM 05/25/2022   11:24 AM  BP/Weight  Systolic BP 278 718 367  255 001 642  Diastolic BP 80 84 80  80 86 82  Wt. (Lbs)   201.04 200.4  202.08   BMI   32.45 kg/m2 32.35 kg/m2  32.62 kg/m2

## 2022-08-21 NOTE — Progress Notes (Signed)
Sarah Coleman     MRN: [id number]      DOB: May 10, 1951   HPI Sarah Coleman is here for follow up and re-evaluation of chronic medical conditions, medication management and review of any available recent lab and radiology data.  Preventive health is updated, specifically  Cancer screening and Immunization.   Questions or concerns regarding consultations or procedures which the PT has had in the interim are  addressed. The PT denies any adverse reactions to current medications since the last visit.  There are no new concerns.  Neckpain increased when cold , otherwise better overall ROS Denies recent fever or chills. Denies sinus pressure, nasal congestion, ear pain or sore throat. Denies chest congestion, productive cough or wheezing. Denies chest pains, palpitations and leg swelling Denies abdominal pain, nausea, vomiting,diarrhea or constipation.   Denies dysuria, frequency, hesitancy or incontinence. Denies headaches, seizures, numbness, or tingling. Denies depression, anxiety or insomnia. Denies skin break down or rash.   PE  BP 134/80   Pulse 90   Ht '5\' 6"'$  (1.676 m)   Wt 201 lb 0.6 oz (91.2 kg)   SpO2 93%   BMI 32.45 kg/m   Patient alert and oriented and in no cardiopulmonary distress.  HEENT: No facial asymmetry, EOMI,     Neck supple .  Chest: Clear to auscultation bilaterally.  CVS: S1, S2 no murmurs, no S3.Regular rate.  ABD: Soft non tender.   Ext: No edema  MS: Decreased though adequate ROM spine, shoulders, hips and knees.  Skin: Intact, no ulcerations or rash noted.  Psych: Good eye contact, normal affect. Memory intact not anxious or depressed appearing.  CNS: CN 2-12 intact, power,  normal throughout.no focal deficits noted.   Assessment & Plan Essential hypertension Controlled, no change in medication DASH diet and commitment to daily physical activity for a minimum of 30 minutes discussed and encouraged, as a part of hypertension management. The  importance of attaining a healthy weight is also discussed.     08/19/2022   11:37 AM 08/19/2022   11:04 AM 08/19/2022   10:56 AM 08/02/2022   10:01 AM 06/03/2022   10:59 AM 06/03/2022   10:58 AM 05/25/2022   11:24 AM  BP/Weight  Systolic BP 202 542 706  237 628 315  Diastolic BP 80 84 80  80 86 82  Wt. (Lbs)   201.04 200.4  202.08   BMI   32.45 kg/m2 32.35 kg/m2  32.62 kg/m2        Allergic rhinitis Controlled, no change in medication   Urinary incontinence Controlled, no change in medication Managed by urology  Prediabetes Patient educated about the importance of limiting  Carbohydrate intake , the need to commit to daily physical activity for a minimum of 30 minutes , and to commit weight loss. The fact that changes in all these areas will reduce or eliminate all together the development of diabetes is stressed.      Latest Ref Rng & Units 07/26/2022   10:49 AM 07/22/2022   10:47 AM 04/11/2022    1:48 PM 03/04/2022    9:44 AM 11/29/2021    9:28 AM  Diabetic Labs  HbA1c 4.8 - 5.6 %  6.1    6.0   Chol 100 - 199 mg/dL  170   138  162   HDL >39 mg/dL  42   35  35   Calc LDL 0 - 99 mg/dL  94   74  74   Triglycerides 0 -  149 mg/dL  201   167  328   Creatinine 0.44 - 1.00 mg/dL 1.42  1.36  1.21  1.31  1.20       08/19/2022   11:37 AM 08/19/2022   11:04 AM 08/19/2022   10:56 AM 08/02/2022   10:01 AM 06/03/2022   10:59 AM 06/03/2022   10:58 AM 05/25/2022   11:24 AM  BP/Weight  Systolic BP 419 622 297  989 211 941  Diastolic BP 80 84 80  80 86 82  Wt. (Lbs)   201.04 200.4  202.08   BMI   32.45 kg/m2 32.35 kg/m2  32.62 kg/m2        No data to display          Updated lab needed at/ before next visit.   Hyperlipidemia LDL goal <100 Hyperlipidemia:Low fat diet discussed and encouraged.   Lipid Panel  Lab Results  Component Value Date   CHOL 170 07/22/2022   HDL 42 07/22/2022   LDLCALC 94 07/22/2022   LDLDIRECT 69 02/13/2009   TRIG 201 (H) 07/22/2022    CHOLHDL 4.0 07/22/2022     Updated lab needed at/ before next visit.   Vitamin D deficiency Updated lab needed at/ before next visit.

## 2022-08-21 NOTE — Assessment & Plan Note (Signed)
Controlled, no change in medication Managed by urology

## 2022-08-21 NOTE — Assessment & Plan Note (Signed)
Hyperlipidemia:Low fat diet discussed and encouraged.   Lipid Panel  Lab Results  Component Value Date   CHOL 170 07/22/2022   HDL 42 07/22/2022   LDLCALC 94 07/22/2022   LDLDIRECT 69 02/13/2009   TRIG 201 (H) 07/22/2022   CHOLHDL 4.0 07/22/2022     Updated lab needed at/ before next visit.

## 2022-08-21 NOTE — Assessment & Plan Note (Signed)
Updated lab needed at/ before next visit.   

## 2022-08-21 NOTE — Assessment & Plan Note (Signed)
Controlled, no change in medication  

## 2022-08-22 DIAGNOSIS — N1832 Chronic kidney disease, stage 3b: Secondary | ICD-10-CM | POA: Diagnosis not present

## 2022-08-22 DIAGNOSIS — D472 Monoclonal gammopathy: Secondary | ICD-10-CM | POA: Diagnosis not present

## 2022-08-22 DIAGNOSIS — I5032 Chronic diastolic (congestive) heart failure: Secondary | ICD-10-CM | POA: Diagnosis not present

## 2022-08-22 DIAGNOSIS — D75839 Thrombocytosis, unspecified: Secondary | ICD-10-CM | POA: Diagnosis not present

## 2022-08-22 DIAGNOSIS — I129 Hypertensive chronic kidney disease with stage 1 through stage 4 chronic kidney disease, or unspecified chronic kidney disease: Secondary | ICD-10-CM | POA: Diagnosis not present

## 2022-08-30 ENCOUNTER — Other Ambulatory Visit: Payer: Self-pay | Admitting: Family Medicine

## 2022-08-30 ENCOUNTER — Ambulatory Visit (INDEPENDENT_AMBULATORY_CARE_PROVIDER_SITE_OTHER): Payer: PPO | Admitting: Urology

## 2022-08-30 ENCOUNTER — Encounter: Payer: Self-pay | Admitting: Urology

## 2022-08-30 VITALS — BP 152/87 | HR 105

## 2022-08-30 DIAGNOSIS — R3915 Urgency of urination: Secondary | ICD-10-CM | POA: Diagnosis not present

## 2022-08-30 DIAGNOSIS — N952 Postmenopausal atrophic vaginitis: Secondary | ICD-10-CM | POA: Diagnosis not present

## 2022-08-30 DIAGNOSIS — R300 Dysuria: Secondary | ICD-10-CM | POA: Diagnosis not present

## 2022-08-30 MED ORDER — ESTRADIOL 0.1 MG/GM VA CREA: TOPICAL_CREAM | VAGINAL | 3 refills | Status: DC

## 2022-08-30 MED ORDER — SOLIFENACIN SUCCINATE 10 MG PO TABS: 10.0000 mg | ORAL_TABLET | Freq: Every day | ORAL | 3 refills | Status: DC

## 2022-08-30 NOTE — Progress Notes (Signed)
History of Present Illness: Sarah Coleman is a 72 y.o. year old female here for continued f/u of recurrent UTIs/atrophic vaginitis and OAB sx's. She is on estogen crean 2 nights/week as well as solifenacin 3 nights/week.  She is here for routine follow-up.  She has had no urinary tract infections over the past year.  Occasionally foul-smelling urine-she will temporarily increase her estrogen cream to nightly and it seems to go away.  She has a good stream.  No significant urgency or frequency on the Solifenacin.  Past Medical History:  Diagnosis Date   Allergic rhinitis    Allergy    Anemia    Arthritis    GERD (gastroesophageal reflux disease) 2013   Hyperlipidemia    Hypertension    Metabolic syndrome X 8242   Morbid obesity ([place]) 2000   Obesity    Prediabetes 2010   Renal insufficiency    Seizures ([place])    as child; unknown etiology.   Vertigo     Past Surgical History:  Procedure Laterality Date   BIOPSY  04/11/2017   Procedure: BIOPSY;  Surgeon: Danie Binder, MD;  [facility];  Service: Endoscopy;;  gastric bx's   COLONOSCOPY N/A 10/07/2016   Procedure: COLONOSCOPY;  Surgeon: Danie Binder, MD;  [facility];  Service: Endoscopy;  Laterality: N/A;  9:30 AM   COLONOSCOPY WITH PROPOFOL N/A 11/01/2016   Procedure: COLONOSCOPY WITH PROPOFOL;  Surgeon: Danie Binder, MD;  [facility];  Service: Endoscopy;  Laterality: N/A;  830    COLONOSCOPY WITH PROPOFOL N/A 01/23/2018   Procedure: COLONOSCOPY WITH PROPOFOL;  Surgeon: Danie Binder, MD;  [facility];  Service: Endoscopy;  Laterality: N/A;  2:00pm - pt knows to arrive at 10:15   COLONOSCOPY WITH PROPOFOL N/A 09/27/2021   Procedure: COLONOSCOPY WITH PROPOFOL;  Surgeon: Eloise Harman, DO;  [facility];  Service: Endoscopy;  Laterality: N/A;  8:30 / ASA 3   ESOPHAGOGASTRODUODENOSCOPY (EGD) WITH PROPOFOL N/A 04/11/2017   Procedure: ESOPHAGOGASTRODUODENOSCOPY (EGD)  WITH PROPOFOL;  Surgeon: Danie Binder, MD;  [facility];  Service: Endoscopy;  Laterality: N/A;  10:45am   GERD     PARTIAL HYSTERECTOMY  1987   secondary to ovarian cyst    POLYPECTOMY  11/01/2016   Procedure: POLYPECTOMY;  Surgeon: Danie Binder, MD;  [facility];  Service: Endoscopy;;  colon   POLYPECTOMY  01/23/2018   Procedure: POLYPECTOMY;  Surgeon: Danie Binder, MD;  [facility];  Service: Endoscopy;;  ascending colon   SAVORY DILATION N/A 04/11/2017   Procedure: SAVORY DILATION;  Surgeon: Danie Binder, MD;  [facility];  Service: Endoscopy;  Laterality: N/A;   TUBAL LIGATION  1975    Home Medications:  (Not in a hospital admission)   Allergies:  Allergies  Allergen Reactions   Banana Swelling   Cinnamon Swelling   Ibuprofen Swelling   Orange Fruit [Citrus] Swelling    Pt states she found out that she was allergic to oranges around may 2023 has swelling reations.   Other     Orange   Penicillins Nausea And Vomiting    Has patient had a PCN reaction causing immediate rash, facial/tongue/throat swelling, SOB or lightheadedness with hypotension:No Has patient had a PCN reaction causing severe rash involving mucus membranes or skin necrosis:No Has patient had a PCN reaction that required hospitalization:No Has patient had a PCN reaction occurring  within the last 10 years:No If all of the above answers are "NO", then may proceed with Cephalosporin use.    Sweet Potato Swelling    Family History  Problem Relation Age of Onset   Heart attack Mother    Heart disease Mother 19       massive heart attack   Prostate cancer Father    Colon cancer Father 31   Hypertension Sister    Ovarian cancer Sister    Cancer Brother 74       oral    Hypertension Brother    Heart disease Brother     Social History:  reports that she quit smoking about 48 years ago. Her smoking use included cigarettes. She has a 3.00 pack-year  smoking history. She has never used smokeless tobacco. She reports that she does not drink alcohol and does not use drugs.  ROS: A complete review of systems was performed.  All systems are negative except for pertinent findings as noted.  Physical Exam:  Vital signs in last 24 hours: '@VSRANGES'$ @ General:  Alert and oriented, No acute distress HEENT: Normocephalic, atraumatic Neck: No JVD or lymphadenopathy Cardiovascular: Regular rate  Lungs: Normal inspiratory/expiratory excursion Extremities: No edema Neurologic: Grossly intact  I have reviewed prior pt notes  I have reviewed urinalysis results  I have independently reviewed prior imaging--she had a nuclear medicine renogram in 2023 that was normal.  Additionally, renal ultrasound normal.  I have reviewed prior urine culture-no recent positive cultures  Impression/Assessment:  Vaginal atrophic changes-on estrogen cream, tolerating well  Overactive bladder symptoms, doing well on Solifenacin  Plan:  Continue on the estrogen cream as well as a Solifenacin at reduced dosing  I will see back in a year for recheck  Jorja Loa 08/30/2022, 6:47 AM  [name]. Zalyn Amend MD

## 2022-08-31 LAB — URINALYSIS, ROUTINE W REFLEX MICROSCOPIC
Bilirubin, UA: NEGATIVE
Glucose, UA: NEGATIVE
Leukocytes,UA: NEGATIVE
Nitrite, UA: NEGATIVE
Protein,UA: NEGATIVE
RBC, UA: NEGATIVE
Specific Gravity, UA: 1.015 (ref 1.005–1.030)
Urobilinogen, Ur: 0.2 mg/dL (ref 0.2–1.0)
pH, UA: 7 (ref 5.0–7.5)

## 2022-09-05 ENCOUNTER — Other Ambulatory Visit: Payer: Self-pay | Admitting: Family Medicine

## 2022-09-05 MED ORDER — AMLODIPINE BESYLATE 5 MG PO TABS: 5.0000 mg | ORAL_TABLET | Freq: Every day | ORAL | 2 refills | Status: DC

## 2022-09-05 NOTE — Addendum Note (Signed)
Addended by: Fayrene Helper on: 09/05/2022 09:13 PM   Modules accepted: Orders

## 2022-09-09 ENCOUNTER — Ambulatory Visit: Payer: PPO | Admitting: Family Medicine

## 2022-09-09 DIAGNOSIS — B379 Candidiasis, unspecified: Secondary | ICD-10-CM | POA: Diagnosis not present

## 2022-09-09 DIAGNOSIS — T3695XA Adverse effect of unspecified systemic antibiotic, initial encounter: Secondary | ICD-10-CM | POA: Diagnosis not present

## 2022-09-15 ENCOUNTER — Other Ambulatory Visit: Payer: Self-pay | Admitting: Family Medicine

## 2022-09-21 ENCOUNTER — Other Ambulatory Visit: Payer: Self-pay | Admitting: Family Medicine

## 2022-09-30 ENCOUNTER — Other Ambulatory Visit: Payer: Self-pay | Admitting: Family Medicine

## 2022-09-30 DIAGNOSIS — K219 Gastro-esophageal reflux disease without esophagitis: Secondary | ICD-10-CM

## 2022-10-21 ENCOUNTER — Other Ambulatory Visit: Payer: Self-pay | Admitting: Family Medicine

## 2022-10-24 ENCOUNTER — Other Ambulatory Visit: Payer: Self-pay | Admitting: Family Medicine

## 2022-10-26 ENCOUNTER — Ambulatory Visit: Payer: PPO | Admitting: Family Medicine

## 2022-11-03 ENCOUNTER — Other Ambulatory Visit: Payer: Self-pay | Admitting: Family Medicine

## 2022-11-08 ENCOUNTER — Other Ambulatory Visit: Payer: Self-pay | Admitting: Family Medicine

## 2022-11-10 ENCOUNTER — Other Ambulatory Visit: Payer: Self-pay | Admitting: Family Medicine

## 2022-11-14 ENCOUNTER — Other Ambulatory Visit: Payer: Self-pay | Admitting: Family Medicine

## 2022-11-17 ENCOUNTER — Telehealth: Payer: Self-pay | Admitting: Family Medicine

## 2022-11-17 ENCOUNTER — Other Ambulatory Visit: Payer: Self-pay | Admitting: Family Medicine

## 2022-11-17 NOTE — Telephone Encounter (Signed)
Prescription Request  11/17/2022  LOV: 08/19/2022  What is the name of the medication or equipment? spironolactone (ALDACTONE) 100 MG tablet   Have you contacted your pharmacy to request a refill? Yes   Which pharmacy would you like this sent to?  [facility], [place] [address] Phone: [phone] Fax: [phone]  [facility] [place], [place] [address] Phone: [phone] Fax: [phone]    Patient notified that their request is being sent to the clinical staff for review and that they should receive a response within 2 business days.   Please advise at [facility] [phone]

## 2022-11-17 NOTE — Telephone Encounter (Signed)
Refills sent to pharmacy, patient advised. 

## 2022-11-25 ENCOUNTER — Ambulatory Visit (INDEPENDENT_AMBULATORY_CARE_PROVIDER_SITE_OTHER): Payer: PPO | Admitting: Family Medicine

## 2022-11-25 ENCOUNTER — Encounter: Payer: Self-pay | Admitting: Family Medicine

## 2022-11-25 VITALS — BP 122/80 | HR 93 | Resp 16 | Ht 66.0 in | Wt 202.0 lb

## 2022-11-25 DIAGNOSIS — E785 Hyperlipidemia, unspecified: Secondary | ICD-10-CM

## 2022-11-25 DIAGNOSIS — Z1231 Encounter for screening mammogram for malignant neoplasm of breast: Secondary | ICD-10-CM | POA: Diagnosis not present

## 2022-11-25 DIAGNOSIS — N393 Stress incontinence (female) (male): Secondary | ICD-10-CM

## 2022-11-25 DIAGNOSIS — N1831 Chronic kidney disease, stage 3a: Secondary | ICD-10-CM | POA: Diagnosis not present

## 2022-11-25 DIAGNOSIS — D472 Monoclonal gammopathy: Secondary | ICD-10-CM | POA: Diagnosis not present

## 2022-11-25 DIAGNOSIS — J302 Other seasonal allergic rhinitis: Secondary | ICD-10-CM

## 2022-11-25 DIAGNOSIS — E559 Vitamin D deficiency, unspecified: Secondary | ICD-10-CM

## 2022-11-25 DIAGNOSIS — I10 Essential (primary) hypertension: Secondary | ICD-10-CM

## 2022-11-25 DIAGNOSIS — R7303 Prediabetes: Secondary | ICD-10-CM

## 2022-11-25 MED ORDER — FENOFIBRATE 48 MG PO TABS: 48.0000 mg | ORAL_TABLET | Freq: Every day | ORAL | 1 refills | Status: DC

## 2022-11-25 MED ORDER — CHLORTHALIDONE 25 MG PO TABS: 25.0000 mg | ORAL_TABLET | Freq: Every day | ORAL | 1 refills | Status: DC

## 2022-11-25 MED ORDER — CYCLOBENZAPRINE HCL 5 MG PO TABS: ORAL_TABLET | ORAL | 1 refills | Status: DC

## 2022-11-25 MED ORDER — MONTELUKAST SODIUM 10 MG PO TABS: 10.0000 mg | ORAL_TABLET | Freq: Every day | ORAL | 1 refills | Status: DC

## 2022-11-25 NOTE — Patient Instructions (Signed)
Annual exam Oct 13 or after, call if you need me sooner  We will call you with lab results   Please schedule mammogram at checkout  Keep up the  good work  It is important that you exercise regularly at least 30 minutes 5 times a week. If you develop chest pain, have severe difficulty breathing, or feel very tired, stop exercising immediately and seek medical attention   Think about what you will eat, plan ahead. Choose " clean, green, fresh or frozen" over canned, processed or packaged foods which are more sugary, salty and fatty. 70 to 75% of food eaten should be vegetables and fruit. Three meals at set times with snacks allowed between meals, but they must be fruit or vegetables. Aim to eat over a 12 hour period , example 7 am to 7 pm, and STOP after  your last meal of the day. Drink water,generally about 64 ounces per day, no other drink is as healthy. Fruit juice is best enjoyed in a healthy way, by EATING the fruit.  Thanks for choosing [facility], we consider it a privelige to serve you.

## 2022-11-25 NOTE — Progress Notes (Unsigned)
   Sarah Coleman     MRN: [id number]      DOB: Mar 05, 1951   HPI Sarah Coleman is here for follow up and re-evaluation of chronic medical conditions, medication management and review of any available recent lab and radiology data.  Preventive health is updated, specifically  Cancer screening and Immunization.   Questions or concerns regarding consultations or procedures which the PT has had in the interim are  addressed. The PT denies any adverse reactions to current medications since the last visit.  There are no new concerns.  There are no specific complaints   ROS Denies recent fever or chills. Denies sinus pressure, nasal congestion, ear pain or sore throat. Denies chest congestion, productive cough or wheezing. Denies chest pains, palpitations and leg swelling Denies abdominal pain, nausea, vomiting,diarrhea or constipation.   Denies dysuria, frequency, hesitancy or incontinence. Denies joint pain, swelling and limitation in mobility. Denies headaches, seizures, numbness, or tingling. Denies depression, anxiety or insomnia. Denies skin break down or rash.   PE  BP 122/80   Pulse 93   Resp 16   Ht 5\' 6"  (1.676 m)   Wt 202 lb (91.6 kg)   SpO2 95%   BMI 32.60 kg/m   Patient alert and oriented and in no cardiopulmonary distress.  HEENT: No facial asymmetry, EOMI,     Neck supple .  Chest: Clear to auscultation bilaterally.  CVS: S1, S2 no murmurs, no S3.Regular rate.  ABD: Soft non tender.   Ext: No edema  MS: Adequate ROM spine, shoulders, hips and knees.  Skin: Intact, no ulcerations or rash noted.  Psych: Good eye contact, normal affect. Memory intact not anxious or depressed appearing.  CNS: CN 2-12 intact, power,  normal throughout.no focal deficits noted.   Assessment & Plan  No problem-specific Assessment & Plan notes found for this encounter.

## 2022-11-26 LAB — CMP14+EGFR
ALT: 22 IU/L (ref 0–32)
AST: 18 IU/L (ref 0–40)
Albumin/Globulin Ratio: 1.8 (ref 1.2–2.2)
Albumin: 4.8 g/dL (ref 3.8–4.8)
Alkaline Phosphatase: 75 IU/L (ref 44–121)
BUN/Creatinine Ratio: 15 (ref 12–28)
BUN: 22 mg/dL (ref 8–27)
Bilirubin Total: 0.5 mg/dL (ref 0.0–1.2)
CO2: 18 mmol/L — ABNORMAL LOW (ref 20–29)
Calcium: 10.3 mg/dL (ref 8.7–10.3)
Chloride: 98 mmol/L (ref 96–106)
Creatinine, Ser: 1.47 mg/dL — ABNORMAL HIGH (ref 0.57–1.00)
Globulin, Total: 2.6 g/dL (ref 1.5–4.5)
Glucose: 108 mg/dL — ABNORMAL HIGH (ref 70–99)
Potassium: 4.6 mmol/L (ref 3.5–5.2)
Sodium: 133 mmol/L — ABNORMAL LOW (ref 134–144)
Total Protein: 7.4 g/dL (ref 6.0–8.5)
eGFR: 37 mL/min/{1.73_m2} — ABNORMAL LOW (ref >59)

## 2022-11-26 LAB — LIPID PANEL
Chol/HDL Ratio: 4.1 ratio (ref 0.0–4.4)
Cholesterol, Total: 171 mg/dL (ref 100–199)
HDL: 42 mg/dL (ref >39)
LDL Chol Calc (NIH): 92 mg/dL (ref 0–99)
Triglycerides: 215 mg/dL — ABNORMAL HIGH (ref 0–149)
VLDL Cholesterol Cal: 37 mg/dL (ref 5–40)

## 2022-11-26 LAB — VITAMIN D 25 HYDROXY (VIT D DEFICIENCY, FRACTURES): Vit D, 25-Hydroxy: 58.3 ng/mL (ref 30.0–100.0)

## 2022-11-26 LAB — TSH: TSH: 1.17 u[IU]/mL (ref 0.450–4.500)

## 2022-11-26 LAB — HEMOGLOBIN A1C
Est. average glucose Bld gHb Est-mCnc: 137 mg/dL
Hgb A1c MFr Bld: 6.4 % — ABNORMAL HIGH (ref 4.8–5.6)

## 2022-11-28 ENCOUNTER — Encounter: Payer: Self-pay | Admitting: Family Medicine

## 2022-11-28 ENCOUNTER — Other Ambulatory Visit: Payer: Self-pay | Admitting: Family Medicine

## 2022-11-28 DIAGNOSIS — I10 Essential (primary) hypertension: Secondary | ICD-10-CM

## 2022-11-28 DIAGNOSIS — R7303 Prediabetes: Secondary | ICD-10-CM

## 2022-11-28 DIAGNOSIS — E785 Hyperlipidemia, unspecified: Secondary | ICD-10-CM

## 2022-11-28 NOTE — Assessment & Plan Note (Signed)
Controlled, no change in medication  

## 2022-11-28 NOTE — Assessment & Plan Note (Signed)
  Patient re-educated about  the importance of commitment to a  minimum of 150 minutes of exercise per week as able.  The importance of healthy food choices with portion control discussed, as well as eating regularly and within a 12 hour window most days. The need to choose "clean , green" food 50 to 75% of the time is discussed, as well as to make water the primary drink and set a goal of 64 ounces water daily.       11/25/2022   11:07 AM 08/19/2022   10:56 AM 08/02/2022   10:01 AM  Weight /BMI  Weight 202 lb 201 lb 0.6 oz 200 lb 6.4 oz  Height 5\' 6"  (1.676 m) 5\' 6"  (1.676 m) 5\' 6"  (1.676 m)  BMI 32.6 kg/m2 32.45 kg/m2 32.35 kg/m2

## 2022-11-28 NOTE — Assessment & Plan Note (Signed)
Controlled, no change in medication Managed by Urology

## 2022-11-28 NOTE — Assessment & Plan Note (Signed)
Followed by Nephrology, stable 

## 2022-11-28 NOTE — Assessment & Plan Note (Signed)
Hyperlipidemia:Low fat diet discussed and encouraged.   Lipid Panel  Lab Results  Component Value Date   CHOL 171 11/25/2022   HDL 42 11/25/2022   LDLCALC 92 11/25/2022   LDLDIRECT 69 02/13/2009   TRIG 215 (H) 11/25/2022   CHOLHDL 4.1 11/25/2022     Needs to reduce dried and fatty foods

## 2022-11-28 NOTE — Assessment & Plan Note (Addendum)
Deteriorated Patient educated about the importance of limiting  Carbohydrate intake , the need to commit to daily physical activity for a minimum of 30 minutes , and to commit weight loss. The fact that changes in all these areas will reduce or eliminate all together the development of diabetes is stressed.      Latest Ref Rng & Units 11/25/2022    9:39 AM 07/26/2022   10:49 AM 07/22/2022   10:47 AM 04/11/2022    1:48 PM 03/04/2022    9:44 AM  Diabetic Labs  HbA1c 4.8 - 5.6 % 6.4   6.1     Chol 100 - 199 mg/dL 127   517   001   HDL >74 mg/dL 42   42   35   Calc LDL 0 - 99 mg/dL 92   94   74   Triglycerides 0 - 149 mg/dL 944   967   591   Creatinine 0.57 - 1.00 mg/dL 6.38  4.66  5.99  3.57  1.31       11/25/2022   11:07 AM 08/30/2022    2:17 PM 08/19/2022   11:37 AM 08/19/2022   11:04 AM 08/19/2022   10:56 AM 08/02/2022   10:01 AM 06/03/2022   10:59 AM  BP/Weight  Systolic BP 122 152 134 126 148  134  Diastolic BP 80 87 80 84 80  80  Wt. (Lbs) 202    201.04 200.4   BMI 32.6 kg/m2    32.45 kg/m2 32.35 kg/m2        No data to display          Updated lab needed at/ before next visit.

## 2022-11-28 NOTE — Assessment & Plan Note (Signed)
Controlled, no change in medication DASH diet and commitment to daily physical activity for a minimum of 30 minutes discussed and encouraged, as a part of hypertension management. The importance of attaining a healthy weight is also discussed.     11/25/2022   11:07 AM 08/30/2022    2:17 PM 08/19/2022   11:37 AM 08/19/2022   11:04 AM 08/19/2022   10:56 AM 08/02/2022   10:01 AM 06/03/2022   10:59 AM  BP/Weight  Systolic BP 122 152 134 126 148  134  Diastolic BP 80 87 80 84 80  80  Wt. (Lbs) 202    201.04 200.4   BMI 32.6 kg/m2    32.45 kg/m2 32.35 kg/m2

## 2022-11-28 NOTE — Assessment & Plan Note (Signed)
Followed by Heme/ Onc 

## 2022-11-30 ENCOUNTER — Other Ambulatory Visit: Payer: Self-pay | Admitting: Family Medicine

## 2022-11-30 DIAGNOSIS — K219 Gastro-esophageal reflux disease without esophagitis: Secondary | ICD-10-CM

## 2022-12-16 ENCOUNTER — Other Ambulatory Visit: Payer: Self-pay | Admitting: Family Medicine

## 2022-12-28 ENCOUNTER — Other Ambulatory Visit: Payer: Self-pay | Admitting: Family Medicine

## 2023-01-11 ENCOUNTER — Other Ambulatory Visit: Payer: Self-pay | Admitting: Family Medicine

## 2023-01-24 ENCOUNTER — Inpatient Hospital Stay: Payer: PPO | Attending: Physician Assistant

## 2023-01-24 ENCOUNTER — Other Ambulatory Visit: Payer: Self-pay | Admitting: Family Medicine

## 2023-01-24 DIAGNOSIS — Z806 Family history of leukemia: Secondary | ICD-10-CM | POA: Insufficient documentation

## 2023-01-24 DIAGNOSIS — N1832 Chronic kidney disease, stage 3b: Secondary | ICD-10-CM | POA: Diagnosis not present

## 2023-01-24 DIAGNOSIS — Z87891 Personal history of nicotine dependence: Secondary | ICD-10-CM | POA: Insufficient documentation

## 2023-01-24 DIAGNOSIS — Z8049 Family history of malignant neoplasm of other genital organs: Secondary | ICD-10-CM | POA: Insufficient documentation

## 2023-01-24 DIAGNOSIS — Z800 Family history of malignant neoplasm of digestive organs: Secondary | ICD-10-CM | POA: Insufficient documentation

## 2023-01-24 DIAGNOSIS — D72820 Lymphocytosis (symptomatic): Secondary | ICD-10-CM | POA: Diagnosis not present

## 2023-01-24 DIAGNOSIS — D75839 Thrombocytosis, unspecified: Secondary | ICD-10-CM | POA: Diagnosis not present

## 2023-01-24 DIAGNOSIS — D472 Monoclonal gammopathy: Secondary | ICD-10-CM | POA: Insufficient documentation

## 2023-01-24 DIAGNOSIS — R778 Other specified abnormalities of plasma proteins: Secondary | ICD-10-CM

## 2023-01-24 DIAGNOSIS — Z8041 Family history of malignant neoplasm of ovary: Secondary | ICD-10-CM | POA: Diagnosis not present

## 2023-01-24 LAB — CBC WITH DIFFERENTIAL/PLATELET
Abs Immature Granulocytes: 0.02 10*3/uL (ref 0.00–0.07)
Basophils Absolute: 0 10*3/uL (ref 0.0–0.1)
Basophils Relative: 0 %
Eosinophils Absolute: 0.1 10*3/uL (ref 0.0–0.5)
Eosinophils Relative: 1 %
HCT: 41.5 % (ref 36.0–46.0)
Hemoglobin: 13.9 g/dL (ref 12.0–15.0)
Immature Granulocytes: 0 %
Lymphocytes Relative: 56 %
Lymphs Abs: 3.9 10*3/uL (ref 0.7–4.0)
MCH: 31.8 pg (ref 26.0–34.0)
MCHC: 33.5 g/dL (ref 30.0–36.0)
MCV: 95 fL (ref 80.0–100.0)
Monocytes Absolute: 0.4 10*3/uL (ref 0.1–1.0)
Monocytes Relative: 6 %
Neutro Abs: 2.6 10*3/uL (ref 1.7–7.7)
Neutrophils Relative %: 37 %
Platelets: 298 10*3/uL (ref 150–400)
RBC: 4.37 MIL/uL (ref 3.87–5.11)
RDW: 12.7 % (ref 11.5–15.5)
WBC: 6.9 10*3/uL (ref 4.0–10.5)
nRBC: 0 % (ref 0.0–0.2)

## 2023-01-24 LAB — COMPREHENSIVE METABOLIC PANEL
ALT: 19 U/L (ref 0–44)
AST: 17 U/L (ref 15–41)
Albumin: 4.8 g/dL (ref 3.5–5.0)
Alkaline Phosphatase: 64 U/L (ref 38–126)
Anion gap: 10 (ref 5–15)
BUN: 21 mg/dL (ref 8–23)
CO2: 23 mmol/L (ref 22–32)
Calcium: 10 mg/dL (ref 8.9–10.3)
Chloride: 97 mmol/L — ABNORMAL LOW (ref 98–111)
Creatinine, Ser: 1.4 mg/dL — ABNORMAL HIGH (ref 0.44–1.00)
GFR, Estimated: 40 mL/min — ABNORMAL LOW (ref >60)
Glucose, Bld: 112 mg/dL — ABNORMAL HIGH (ref 70–99)
Potassium: 4.2 mmol/L (ref 3.5–5.1)
Sodium: 130 mmol/L — ABNORMAL LOW (ref 135–145)
Total Bilirubin: 0.7 mg/dL (ref 0.3–1.2)
Total Protein: 8.6 g/dL — ABNORMAL HIGH (ref 6.5–8.1)

## 2023-01-24 LAB — LACTATE DEHYDROGENASE: LDH: 123 U/L (ref 98–192)

## 2023-01-26 LAB — KAPPA/LAMBDA LIGHT CHAINS
Kappa free light chain: 22.2 mg/L — ABNORMAL HIGH (ref 3.3–19.4)
Kappa, lambda light chain ratio: 1.42 (ref 0.26–1.65)
Lambda free light chains: 15.6 mg/L (ref 5.7–26.3)

## 2023-01-27 ENCOUNTER — Ambulatory Visit (INDEPENDENT_AMBULATORY_CARE_PROVIDER_SITE_OTHER): Payer: PPO | Admitting: Family Medicine

## 2023-01-27 ENCOUNTER — Encounter: Payer: Self-pay | Admitting: Family Medicine

## 2023-01-27 ENCOUNTER — Ambulatory Visit (HOSPITAL_COMMUNITY)
Admission: RE | Admit: 2023-01-27 | Discharge: 2023-01-27 | Disposition: A | Discharge status: Home / Self Care | Payer: PPO | Source: Ambulatory Visit | Attending: Family Medicine | Admitting: Family Medicine

## 2023-01-27 VITALS — BP 129/83 | HR 90 | Ht 66.0 in | Wt 204.1 lb

## 2023-01-27 DIAGNOSIS — M25562 Pain in left knee: Secondary | ICD-10-CM

## 2023-01-27 DIAGNOSIS — Z1231 Encounter for screening mammogram for malignant neoplasm of breast: Secondary | ICD-10-CM

## 2023-01-27 DIAGNOSIS — I10 Essential (primary) hypertension: Secondary | ICD-10-CM

## 2023-01-27 LAB — PROTEIN ELECTROPHORESIS, SERUM
A/G Ratio: 1.3 (ref 0.7–1.7)
Albumin ELP: 4.4 g/dL (ref 2.9–4.4)
Alpha-1-Globulin: 0.2 g/dL (ref 0.0–0.4)
Alpha-2-Globulin: 0.8 g/dL (ref 0.4–1.0)
Beta Globulin: 1.2 g/dL (ref 0.7–1.3)
Gamma Globulin: 1.1 g/dL (ref 0.4–1.8)
Globulin, Total: 3.4 g/dL (ref 2.2–3.9)
M-Spike, %: 0.3 g/dL — ABNORMAL HIGH
Total Protein ELP: 7.8 g/dL (ref 6.0–8.5)

## 2023-01-27 MED ORDER — PREDNISONE 10 MG PO TABS
10.0000 mg | ORAL_TABLET | Freq: Two times a day (BID) | ORAL | 0 refills | Status: DC
Start: 2023-01-27 — End: 2023-05-30

## 2023-01-27 MED ORDER — METHYLPREDNISOLONE ACETATE 80 MG/ML IJ SUSP
80.0000 mg | Freq: Once | INTRAMUSCULAR | Status: AC
Start: 2023-01-27 — End: 2023-01-27
  Administered 2023-01-27: 80 mg via INTRAMUSCULAR

## 2023-01-27 NOTE — Progress Notes (Unsigned)
Sarah Coleman     MRN: [id number]      DOB: 1950-12-11  Chief Complaint  Patient presents with   Follow-up    L knee pain from fall x 2 months ago    HPI Sarah Coleman is here for follow up and re-evaluation of chronic medical conditions, medication management and review of any available recent lab and radiology data.  Preventive health is updated, specifically  Cancer screening and Immunization.   Fell on left knee walking up steps with no light on,2 months ago, initially pain was a 2/ 3 however in past 3 days aute flare ROS Denies recent fever or chills. Denies sinus pressure, nasal congestion, ear pain or sore throat. Denies chest congestion, productive cough or wheezing. Denies chest pains, palpitations and leg swelling Denies abdominal pain, nausea, vomiting,diarrhea or constipation.   Denies dysuria, frequency, hesitancy or incontinence.  Denies headaches, seizures, numbness, or tingling. Denies depression, anxiety or insomnia. Denies skin break down or rash.   PE  BP 129/83 (BP Location: Right Arm, Patient Position: Sitting, Cuff Size: Large)   Pulse 90   Ht 5\' 6"  (1.676 m)   Wt 204 lb 1.3 oz (92.6 kg)   SpO2 96%   BMI 32.94 kg/m   Patient alert and oriented and in no cardiopulmonary distress.  HEENT: No facial asymmetry, EOMI,     Neck supple .  Chest: Clear to auscultation bilaterally.  CVS: S1, S2 no murmurs, no S3.Regular rate.  ABD: Soft non tender.   Ext: No edema  MS: Adequate ROM spine, shoulders, hips and reduced in left  knee which is slightly swollen and tender.  Skin: Intact, no ulcerations or rash noted.  Psych: Good eye contact, normal affect. Memory intact not anxious or depressed appearing.  CNS: CN 2-12 intact, power,  normal throughout.no focal deficits noted.   Assessment & Plan  Acute pain of left knee X ray, depo medrol 80 mg I'm in office followed by 5 day prednisone, if persists  will refer to Ortho  Morbid obesity  (HCC)  Patient re-educated about  the importance of commitment to a  minimum of 150 minutes of exercise per week as able.  The importance of healthy food choices with portion control discussed, as well as eating regularly and within a 12 hour window most days. The need to choose "clean , green" food 50 to 75% of the time is discussed, as well as to make water the primary drink and set a goal of 64 ounces water daily.       01/31/2023   10:07 AM 01/27/2023    1:05 PM 11/25/2022   11:07 AM  Weight /BMI  Weight 202 lb 13.2 oz 204 lb 1.3 oz 202 lb  Height  5\' 6"  (1.676 m) 5\' 6"  (1.676 m)  BMI 32.74 kg/m2 32.94 kg/m2 32.6 kg/m2      Essential hypertension Controlled, no change in medication DASH diet and commitment to daily physical activity for a minimum of 30 minutes discussed and encouraged, as a part of hypertension management. The importance of attaining a healthy weight is also discussed.     01/31/2023   10:07 AM 01/27/2023    1:05 PM 11/25/2022   11:07 AM 08/30/2022    2:17 PM 08/19/2022   11:37 AM 08/19/2022   11:04 AM 08/19/2022   10:56 AM  BP/Weight  Systolic BP 141 129 122 152 134 126 148  Diastolic BP 87 83 80 87 80 84 80  Wt. (  Lbs) 202.82 204.08 202    201.04  BMI 32.74 kg/m2 32.94 kg/m2 32.6 kg/m2    32.45 kg/m2

## 2023-01-27 NOTE — Patient Instructions (Addendum)
F/U as before, call if you need me sooner  Depo medrol 80 mg IM in office today for left knee pain and to  be followed by 5 day course of prednisone at your pharmacy  Please get X ray of knee at hospital today  Please schedule mammogram at checkout  Thanks for choosing [facility], we consider it a privelige to serve you.

## 2023-01-30 NOTE — Progress Notes (Unsigned)
[facility] [address], [place] [address]   CLINIC:  Medical Oncology/Hematology  PCP:  Kerri Perches, MD [address], [address] [place] [address] [phone]   REASON FOR VISIT:  Follow-up for IgG kappa MGUS  PRIOR THERAPY: None  CURRENT THERAPY: Surveillance  INTERVAL HISTORY:   [name] 72 y.o. female returns for routine follow-up of her IgG kappa MGUS.  She was last seen by Rojelio Brenner PA-C on 08/02/2022.  At today's visit, she reports feeling well.  No recent hospitalizations, surgeries, or changes in baseline health status.  She has some intermittent numbers of the fingers and toes along particular nerve distributions - these improved after she started taking magnesium.  She has chronic arthritic pain in her left knee, denies any new bone pain or recent fractures.   Her weight has been stable since her last visit.  She reports occasional hot flashes, but denies any fevers, chills, or night sweats.  No new neurologic symptoms such as tinnitus, new-onset hearing loss, blurred vision, headache, or dizziness.   No thromboembolic events since her last visit.  No new masses or lymphadenopathy per her report.   She has 65% energy and 100% appetite. She endorses that she is maintaining a stable weight.   ASSESSMENT & PLAN:  1.  IgG kappa MGUS - Work-up for CKD by Dr. Wolfgang Phoenix showed serum immunofixation with faint IgG kappa - Hematology work-up (04/11/2022): SPEP with M spike 0.4 Immunofixation confirms IgG kappa Mildly elevated kappa free light chains 22.9 with normal lambda free light chain 15.1 and normal ratio 1.52. Normal LDH and beta-2 microglobulin - 24-hour urine (04/27/2022): Normal urine immunofixation and urine protein levels.  No evidence of monoclonal protein in urine. - Skeletal survey (04/11/2022) with multiple questionable small/faint lucencies throughout the axial and appendicular skeleton, described in more detail in  radiology report - Most recent labs (01/24/2023): M spike stable at 0.3. Kappa/lambda free light chains stable with mildly elevated kappa light chain 22.2, normal lambda 18.6, normal ratio 1.14. Normal LDH. CMP with creatinine 1.40 (baseline CKD stage IIIb). Calcium 10.0 (stopped taking calcium supplements in December 2023)  Normal Hgb.  13.9. - Repeat skeletal survey (07/26/2022): Stable multifocal lytic lesions (5 mm lucency of parietal bone, 7 mm lucency of right scapula, lucencies within the right hemipelvis are not well-visualized, but appears stable measuring up to 7 mm); no new lesions identified - No new bone pain or B symptoms.   Intermittent numbness/tingling of her left foot second through fifth toes and left hand fourth and fifth fingers.  (She is nondiabetic). - PLAN: No evidence of myeloma at this time, though she still needs to check skeletal survey that was requested for 26-month follow-up (if stable, will transition to annual skeletal survey after this) - Will recheck her MGUS/myeloma labs in 6 months with office visit 1 week after.   2.  Thrombocytosis and lymphocytosis, mild - CBC from 04/11/2022 shows mildly elevated platelets 422 and mild lymphocytosis 4.4 - Most recent CBC (01/24/2023) is normal - PLAN: We will continue active surveillance and consider additional workup if necessary   3.  Social/family history: - She lives at home with her husband.  She retired from working at [employer].  Non-smoker.  Uses lots of rate/backstop sprays.  No other chemical exposure. - Maternal aunt had leukemia.  Father had colon cancer.  Sister had uterine cancer.    PLAN SUMMARY: >> Skeletal survey TODAY >> Labs in 6  months = CBC/D, CMP, LDH, SPEP, free light chains >> OFFICE visit in 6 months (1 week after labs)     REVIEW OF SYSTEMS:   Review of Systems  Constitutional:  Positive for fatigue. Negative for appetite change, chills, diaphoresis, fever and unexpected weight  change.  HENT:   Negative for lump/mass and nosebleeds.   Eyes:  Negative for eye problems.  Respiratory:  Negative for cough, hemoptysis and shortness of breath.   Cardiovascular:  Negative for chest pain, leg swelling and palpitations.  Gastrointestinal:  Positive for constipation. Negative for abdominal pain, blood in stool, diarrhea, nausea and vomiting.  Genitourinary:  Negative for hematuria.   Musculoskeletal:  Positive for arthralgias.  Skin: Negative.   Neurological:  Positive for headaches. Negative for dizziness and light-headedness.  Hematological:  Does not bruise/bleed easily.     PHYSICAL EXAM:  ECOG PERFORMANCE STATUS: 1 - Symptomatic but completely ambulatory  There were no vitals filed for this visit. There were no vitals filed for this visit. Physical Exam Constitutional:      Appearance: Normal appearance. She is obese.  Cardiovascular:     Heart sounds: Normal heart sounds.  Pulmonary:     Breath sounds: Normal breath sounds.  Neurological:     General: No focal deficit present.     Mental Status: Mental status is at baseline.  Psychiatric:        Behavior: Behavior normal. Behavior is cooperative.     PAST MEDICAL/SURGICAL HISTORY:  Past Medical History:  Diagnosis Date   Allergic rhinitis    Allergy    Anemia    Arthritis    GERD (gastroesophageal reflux disease) 2013   Hyperlipidemia    Hypertension    Metabolic syndrome X 2000   Morbid obesity (HCC) 2000   Obesity    Prediabetes 2010   Renal insufficiency    Seizures (HCC)    as child; unknown etiology.   Vertigo    Past Surgical History:  Procedure Laterality Date   BIOPSY  04/11/2017   Procedure: BIOPSY;  Surgeon: West Bali, MD;  [facility];  Service: Endoscopy;;  gastric bx's   COLONOSCOPY N/A 10/07/2016   Procedure: COLONOSCOPY;  Surgeon: West Bali, MD;  [facility];  Service: Endoscopy;  Laterality: N/A;  9:30 AM   COLONOSCOPY WITH PROPOFOL N/A  11/01/2016   Procedure: COLONOSCOPY WITH PROPOFOL;  Surgeon: West Bali, MD;  [facility];  Service: Endoscopy;  Laterality: N/A;  830    COLONOSCOPY WITH PROPOFOL N/A 01/23/2018   Procedure: COLONOSCOPY WITH PROPOFOL;  Surgeon: West Bali, MD;  [facility];  Service: Endoscopy;  Laterality: N/A;  2:00pm - pt knows to arrive at 10:15   COLONOSCOPY WITH PROPOFOL N/A 09/27/2021   Procedure: COLONOSCOPY WITH PROPOFOL;  Surgeon: Lanelle Bal, DO;  [facility];  Service: Endoscopy;  Laterality: N/A;  8:30 / ASA 3   ESOPHAGOGASTRODUODENOSCOPY (EGD) WITH PROPOFOL N/A 04/11/2017   Procedure: ESOPHAGOGASTRODUODENOSCOPY (EGD) WITH PROPOFOL;  Surgeon: West Bali, MD;  [facility];  Service: Endoscopy;  Laterality: N/A;  10:45am   GERD     PARTIAL HYSTERECTOMY  1987   secondary to ovarian cyst    POLYPECTOMY  11/01/2016   Procedure: POLYPECTOMY;  Surgeon: West Bali, MD;  [facility];  Service: Endoscopy;;  colon   POLYPECTOMY  01/23/2018   Procedure: POLYPECTOMY;  Surgeon: West Bali, MD;  [facility];  Service: Endoscopy;;  ascending colon   SAVORY DILATION N/A 04/11/2017   Procedure: SAVORY DILATION;  Surgeon: West Bali, MD;  [facility];  Service: Endoscopy;  Laterality: N/A;   TUBAL LIGATION  1975    SOCIAL HISTORY:  Social History   Socioeconomic History   Marital status: Married    Spouse name: Not on file   Number of children: 5   Years of education: Not on file   Highest education level: Not on file  Occupational History   Occupation: retired   Tobacco Use   Smoking status: Former    Packs/day: 1.00    Years: 3.00    Additional pack years: 0.00    Total pack years: 3.00    Types: Cigarettes    Quit date: 08/15/1974    Years since quitting: 48.4   Smokeless tobacco: Never  Vaping Use   Vaping Use: Never used  Substance and Sexual Activity   Alcohol use: No     Alcohol/week: 0.0 standard drinks of alcohol   Drug use: No   Sexual activity: Not Currently    Birth control/protection: Surgical    Comment: hyst  Other Topics Concern   Not on file  Social History Narrative   Not on file   Social Determinants of Health   Financial Resource Strain: Low Risk  (04/20/2022)   Overall Financial Resource Strain (CARDIA)    Difficulty of Paying Living Expenses: Not hard at all  Food Insecurity: No Food Insecurity (04/20/2022)   Hunger Vital Sign    Worried About Running Out of Food in the Last Year: Never true    Ran Out of Food in the Last Year: Never true  Transportation Needs: No Transportation Needs (04/06/2021)   PRAPARE - Administrator, Civil Service (Medical): No    Lack of Transportation (Non-Medical): No  Physical Activity: Insufficiently Active (04/06/2021)   Exercise Vital Sign    Days of Exercise per Week: 3 days    Minutes of Exercise per Session: 30 min  Stress: No Stress Concern Present (04/06/2021)   [employer] of Occupational Health - Occupational Stress Questionnaire    Feeling of Stress : Not at all  Social Connections: Moderately Integrated (04/20/2022)   Social Connection and Isolation Panel [NHANES]    Frequency of Communication with Friends and Family: More than three times a week    Frequency of Social Gatherings with Friends and Family: More than three times a week    Attends Religious Services: More than 4 times per year    Active Member of [employer] or Organizations: No    Attends Banker Meetings: Never    Marital Status: Married  Catering manager Violence: Not At Risk (04/06/2021)   Humiliation, Afraid, Rape, and Kick questionnaire    Fear of Current or Ex-Partner: No    Emotionally Abused: No    Physically Abused: No    Sexually Abused: No    FAMILY HISTORY:  Family History  Problem Relation Age of Onset   Heart attack Mother    Heart disease Mother 26       massive heart attack    Prostate cancer Father    Colon cancer Father 56   Hypertension Sister    Ovarian cancer Sister    Cancer Brother 39       oral    Hypertension Brother    Heart disease Brother     CURRENT MEDICATIONS:  Outpatient Encounter  Medications as of 01/31/2023  Medication Sig   acetaminophen (TYLENOL) 500 MG tablet Take 1,000 mg by mouth every 8 (eight) hours as needed for moderate pain or headache.   amLODipine (NORVASC) 5 MG tablet Take 1 tablet by mouth once daily   azelastine (ASTELIN) 0.1 % nasal spray Use 2 sprays in each nostril every 12 hours for 1 week; After that, you may use 1 spray in each nostril twice a day as needed for allergies/congestion   CALCIUM-MAGNESIUM-ZINC PO Take 1 tablet by mouth 2 (two) times daily.   chlorthalidone (HYGROTON) 25 MG tablet Take 1 tablet (25 mg total) by mouth daily.   cyclobenzaprine (FLEXERIL) 5 MG tablet TAKE 1 TABLET BY MOUTH TWICE DAILY AS NEEDED FOR  NECK  SPASMS   estradiol (ESTRACE) 0.1 MG/GM vaginal cream USE 1 APPLICATORFUL VAGINALLY EVERY OTHER DAY AS NEEDED FOR  VAGINAL  DISCOMFORT   fenofibrate (TRICOR) 48 MG tablet Take 1 tablet (48 mg total) by mouth daily.   ferrous sulfate 325 (65 FE) MG tablet Take 325 mg by mouth daily with breakfast.   fluticasone (FLONASE) 50 MCG/ACT nasal spray Use 2 spray(s) in each nostril once daily   lovastatin (MEVACOR) 40 MG tablet TAKE 1 TABLET BY MOUTH AT BEDTIME   montelukast (SINGULAIR) 10 MG tablet Take 1 tablet (10 mg total) by mouth at bedtime.   pantoprazole (PROTONIX) 20 MG tablet Take 1 tablet by mouth once daily   potassium chloride SA (KLOR-CON M) 20 MEQ tablet TAKE 2 TABLETS BY MOUTH THREE TIMES DAILY   predniSONE (DELTASONE) 10 MG tablet Take 1 tablet (10 mg total) by mouth 2 (two) times daily with a meal.   solifenacin (VESICARE) 10 MG tablet Take 1 tablet (10 mg total) by mouth daily.   spironolactone (ALDACTONE) 100 MG tablet Take 1 tablet by mouth once daily   No facility-administered  encounter medications on file as of 01/31/2023.    ALLERGIES:  Allergies  Allergen Reactions   Banana Swelling   Cinnamon Swelling   Ibuprofen Swelling   Orange Fruit [Citrus] Swelling    Pt states she found out that she was allergic to oranges around may 2023 has swelling reations.   Other     Orange   Penicillins Nausea And Vomiting    Has patient had a PCN reaction causing immediate rash, facial/tongue/throat swelling, SOB or lightheadedness with hypotension:No Has patient had a PCN reaction causing severe rash involving mucus membranes or skin necrosis:No Has patient had a PCN reaction that required hospitalization:No Has patient had a PCN reaction occurring within the last 10 years:No If all of the above answers are "NO", then may proceed with Cephalosporin use.    Sweet Potato Swelling    LABORATORY DATA:  I have reviewed the labs as listed.  CBC    Component Value Date/Time   WBC 6.9 01/24/2023 1017   RBC 4.37 01/24/2023 1017   HGB 13.9 01/24/2023 1017   HGB 12.5 04/26/2021 0931   HCT 41.5 01/24/2023 1017   HCT 36.1 04/26/2021 0931   PLT 298 01/24/2023 1017   PLT 321 03/11/2020 0912   MCV 95.0 01/24/2023 1017   MCV 90 04/26/2021 0931   MCH 31.8 01/24/2023 1017   MCHC 33.5 01/24/2023 1017   RDW 12.7 01/24/2023 1017   RDW 13.6 04/26/2021 0931   LYMPHSABS 3.9 01/24/2023 1017   LYMPHSABS 3.7 (H) 04/26/2021 0931   MONOABS 0.4 01/24/2023 1017   EOSABS 0.1 01/24/2023 1017   EOSABS 0.1 04/26/2021  0931   BASOSABS 0.0 01/24/2023 1017   BASOSABS 0.0 04/26/2021 0931      Latest Ref Rng & Units 01/24/2023   10:17 AM 11/25/2022    9:39 AM 07/26/2022   10:49 AM  CMP  Glucose 70 - 99 mg/dL 409  811  914   BUN 8 - 23 mg/dL 21  22  19    Creatinine 0.44 - 1.00 mg/dL 7.82  9.56  2.13   Sodium 135 - 145 mmol/L 130  133  137   Potassium 3.5 - 5.1 mmol/L 4.2  4.6  4.4   Chloride 98 - 111 mmol/L 97  98  101   CO2 22 - 32 mmol/L 23  18  23    Calcium 8.9 - 10.3 mg/dL 08.6   57.8  46.9   Total Protein 6.5 - 8.1 g/dL 8.6  7.4  8.3   Total Bilirubin 0.3 - 1.2 mg/dL 0.7  0.5  0.4   Alkaline Phos 38 - 126 U/L 64  75  51   AST 15 - 41 U/L 17  18  20    ALT 0 - 44 U/L 19  22  18      DIAGNOSTIC IMAGING:  I have independently reviewed the relevant imaging and discussed with the patient.   WRAP UP:  All questions were answered. The patient knows to call the clinic with any problems, questions or concerns.  Medical decision making: Moderate  Time spent on visit: I spent 20 minutes counseling the patient face to face. The total time spent in the appointment was 30 minutes and more than 50% was on counseling.  Carnella Guadalajara, PA-C  01/31/23 10:46 AM

## 2023-01-31 ENCOUNTER — Encounter: Payer: Self-pay | Admitting: Family Medicine

## 2023-01-31 ENCOUNTER — Ambulatory Visit: Payer: PPO | Admitting: Physician Assistant

## 2023-01-31 ENCOUNTER — Inpatient Hospital Stay: Payer: PPO | Admitting: Physician Assistant

## 2023-01-31 ENCOUNTER — Ambulatory Visit (HOSPITAL_COMMUNITY)
Admission: RE | Admit: 2023-01-31 | Discharge: 2023-01-31 | Disposition: A | Discharge status: Home / Self Care | Payer: PPO | Source: Ambulatory Visit | Attending: Physician Assistant | Admitting: Physician Assistant

## 2023-01-31 VITALS — BP 141/87 | HR 80 | Temp 97.2°F | Resp 20 | Wt 202.8 lb

## 2023-01-31 DIAGNOSIS — M25562 Pain in left knee: Secondary | ICD-10-CM | POA: Insufficient documentation

## 2023-01-31 DIAGNOSIS — M5136 Other intervertebral disc degeneration, lumbar region: Secondary | ICD-10-CM | POA: Diagnosis not present

## 2023-01-31 DIAGNOSIS — D472 Monoclonal gammopathy: Secondary | ICD-10-CM | POA: Insufficient documentation

## 2023-01-31 DIAGNOSIS — M47816 Spondylosis without myelopathy or radiculopathy, lumbar region: Secondary | ICD-10-CM | POA: Diagnosis not present

## 2023-01-31 NOTE — Assessment & Plan Note (Signed)
  Patient re-educated about  the importance of commitment to a  minimum of 150 minutes of exercise per week as able.  The importance of healthy food choices with portion control discussed, as well as eating regularly and within a 12 hour window most days. The need to choose "clean , green" food 50 to 75% of the time is discussed, as well as to make water the primary drink and set a goal of 64 ounces water daily.       01/31/2023   10:07 AM 01/27/2023    1:05 PM 11/25/2022   11:07 AM  Weight /BMI  Weight 202 lb 13.2 oz 204 lb 1.3 oz 202 lb  Height  5\' 6"  (1.676 m) 5\' 6"  (1.676 m)  BMI 32.74 kg/m2 32.94 kg/m2 32.6 kg/m2

## 2023-01-31 NOTE — Assessment & Plan Note (Signed)
Controlled, no change in medication DASH diet and commitment to daily physical activity for a minimum of 30 minutes discussed and encouraged, as a part of hypertension management. The importance of attaining a healthy weight is also discussed.     01/31/2023   10:07 AM 01/27/2023    1:05 PM 11/25/2022   11:07 AM 08/30/2022    2:17 PM 08/19/2022   11:37 AM 08/19/2022   11:04 AM 08/19/2022   10:56 AM  BP/Weight  Systolic BP 141 129 122 152 134 126 148  Diastolic BP 87 83 80 87 80 84 80  Wt. (Lbs) 202.82 204.08 202    201.04  BMI 32.74 kg/m2 32.94 kg/m2 32.6 kg/m2    32.45 kg/m2

## 2023-01-31 NOTE — Assessment & Plan Note (Addendum)
X ray, depo medrol 80 mg I'm in office followed by 5 day prednisone, if persists  will refer to Ortho

## 2023-01-31 NOTE — Patient Instructions (Signed)
[facility] at [facility] **VISIT SUMMARY & IMPORTANT INSTRUCTIONS **   You were seen today by Rojelio Brenner PA-C for your MGUS.    MGUS (Monoclonal Gammopathy of Undetermined Significance) As we discussed, this is an increased amount of abnormal protein in your blood. This is a "precancerous" condition.  It may never cause problems, or it may eventually turn into a type of cancer known as multiple myeloma. At this time, you do NOT have multiple myeloma, and you do not need any treatment.  We will continue to monitor your labs and x-rays.  >> Please stop at radiology TODAY to check your Xrays and make sure you do not have any new bone spots.  MEDICATIONS: No changes to your home medications  FOLLOW-UP APPOINTMENT: Office visit in 6 months (1 week after labs)  ** Thank you for trusting me with your healthcare!  I strive to provide all of my patients with quality care at each visit.  If you receive a survey for this visit, I would be so grateful to you for taking the time to provide feedback.  Thank you in advance!  ~ Myra Weng                   Dr. Doreatha Massed   &   Rojelio Brenner, PA-C   - - - - - - - - - - - - - - - - - -    Thank you for choosing [facility] at [facility] to provide your oncology and hematology care.  To afford each patient quality time with our provider, please arrive at least 15 minutes before your scheduled appointment time.   If you have a lab appointment with the Cancer Center please come in thru the Main Entrance and check in at the main information desk.  You need to re-schedule your appointment should you arrive 10 or more minutes late.  We strive to give you quality time with our providers, and arriving late affects you and other patients whose appointments are after yours.  Also, if you no show three or more times for appointments you may be dismissed from the clinic at the providers discretion.      Again, thank you for choosing [facility].  Our hope is that these requests will decrease the amount of time that you wait before being seen by our physicians.       _____________________________________________________________  Should you have questions after your visit to [facility], please contact our office at [phone] and follow the prompts.  Our office hours are 8:00 a.m. and 4:30 p.m. Monday - Friday.  Please note that voicemails left after 4:00 p.m. may not be returned until the following business day.  We are closed weekends and major holidays.  You do have access to a nurse 24-7, just call the main number to the clinic [phone] and do not press any options, hold on the line and a nurse will answer the phone.    For prescription refill requests, have your pharmacy contact our office and allow 72 hours.

## 2023-02-03 ENCOUNTER — Other Ambulatory Visit: Payer: Self-pay | Admitting: Family Medicine

## 2023-02-03 DIAGNOSIS — K219 Gastro-esophageal reflux disease without esophagitis: Secondary | ICD-10-CM

## 2023-02-04 ENCOUNTER — Other Ambulatory Visit: Payer: Self-pay | Admitting: Family Medicine

## 2023-02-06 ENCOUNTER — Other Ambulatory Visit: Payer: Self-pay | Admitting: Family Medicine

## 2023-02-10 ENCOUNTER — Other Ambulatory Visit: Payer: Self-pay | Admitting: Family Medicine

## 2023-02-13 DIAGNOSIS — D631 Anemia in chronic kidney disease: Secondary | ICD-10-CM | POA: Diagnosis not present

## 2023-02-13 DIAGNOSIS — N1832 Chronic kidney disease, stage 3b: Secondary | ICD-10-CM | POA: Diagnosis not present

## 2023-02-13 DIAGNOSIS — I129 Hypertensive chronic kidney disease with stage 1 through stage 4 chronic kidney disease, or unspecified chronic kidney disease: Secondary | ICD-10-CM | POA: Diagnosis not present

## 2023-02-13 DIAGNOSIS — R809 Proteinuria, unspecified: Secondary | ICD-10-CM | POA: Diagnosis not present

## 2023-02-21 DIAGNOSIS — D472 Monoclonal gammopathy: Secondary | ICD-10-CM | POA: Diagnosis not present

## 2023-02-21 DIAGNOSIS — I5032 Chronic diastolic (congestive) heart failure: Secondary | ICD-10-CM | POA: Diagnosis not present

## 2023-02-21 DIAGNOSIS — N1832 Chronic kidney disease, stage 3b: Secondary | ICD-10-CM | POA: Diagnosis not present

## 2023-02-21 DIAGNOSIS — I129 Hypertensive chronic kidney disease with stage 1 through stage 4 chronic kidney disease, or unspecified chronic kidney disease: Secondary | ICD-10-CM | POA: Diagnosis not present

## 2023-02-24 ENCOUNTER — Other Ambulatory Visit: Payer: Self-pay

## 2023-02-24 MED ORDER — FERROUS SULFATE 325 (65 FE) MG PO TABS: 325.0000 mg | ORAL_TABLET | Freq: Every day | ORAL | 1 refills | Status: DC

## 2023-03-17 ENCOUNTER — Other Ambulatory Visit: Payer: Self-pay | Admitting: Family Medicine

## 2023-03-21 ENCOUNTER — Other Ambulatory Visit: Payer: Self-pay

## 2023-03-21 ENCOUNTER — Other Ambulatory Visit: Payer: Self-pay | Admitting: Family Medicine

## 2023-03-21 DIAGNOSIS — H6502 Acute serous otitis media, left ear: Secondary | ICD-10-CM

## 2023-03-21 MED ORDER — AZELASTINE HCL 0.1 % NA SOLN
NASAL | 12 refills | Status: DC
Start: 2023-03-21 — End: 2023-05-30

## 2023-03-31 ENCOUNTER — Other Ambulatory Visit: Payer: Self-pay | Admitting: Family Medicine

## 2023-04-07 ENCOUNTER — Other Ambulatory Visit: Payer: Self-pay | Admitting: Family Medicine

## 2023-04-07 DIAGNOSIS — K219 Gastro-esophageal reflux disease without esophagitis: Secondary | ICD-10-CM

## 2023-04-14 ENCOUNTER — Other Ambulatory Visit: Payer: Self-pay | Admitting: Family Medicine

## 2023-04-17 ENCOUNTER — Other Ambulatory Visit: Payer: Self-pay | Admitting: Family Medicine

## 2023-04-18 ENCOUNTER — Ambulatory Visit (INDEPENDENT_AMBULATORY_CARE_PROVIDER_SITE_OTHER): Payer: PPO

## 2023-04-18 VITALS — Ht 66.0 in | Wt 204.0 lb

## 2023-04-18 DIAGNOSIS — Z780 Asymptomatic menopausal state: Secondary | ICD-10-CM

## 2023-04-18 DIAGNOSIS — Z0000 Encounter for general adult medical examination without abnormal findings: Secondary | ICD-10-CM

## 2023-04-18 DIAGNOSIS — Z0100 Encounter for examination of eyes and vision without abnormal findings: Secondary | ICD-10-CM

## 2023-04-18 NOTE — Patient Instructions (Signed)
Sarah Coleman , Thank you for taking time to come for your Medicare Wellness Visit. I appreciate your ongoing commitment to your health goals. Please review the following plan we discussed and let me know if I can assist you in the future.   Referrals/Orders/Follow-Ups/Clinician Recommendations:  You have an order for:  []   2D Mammogram  [x]   3D Mammogram  [x]   Bone Density   []   Lung Cancer Screening  Please call for appointment:   [facility] [address] [place], [place] [address] [phone]  Make sure to wear two-piece clothing.  No lotions powders or deodorants the day of the appointment Make sure to bring picture ID and insurance card.  Bring list of medications you are currently taking including any supplements.   Schedule your [place] screening mammogram through MyChart!   Log into your MyChart account.  Go to 'Visit' (or 'Appointments' if on mobile App) --> Schedule an Appointment  Under 'Select a Reason for Visit' choose the Mammogram Screening option.  Complete the pre-visit questions and select the time and place that best fits your schedule.    This is a list of the screening recommended for you and due dates:  Health Maintenance  Topic Date Due   DEXA scan (bone density measurement)  04/13/2022   Flu Shot  03/16/2023   COVID-19 Vaccine (4 - 2023-24 season) 04/16/2023   Medicare Annual Wellness Visit  04/21/2023   Mammogram  06/06/2024   Colon Cancer Screening  09/27/2026   DTaP/Tdap/Td vaccine (3 - Td or Tdap) 04/08/2032   Pneumonia Vaccine  Completed   Hepatitis C Screening  Completed   Zoster (Shingles) Vaccine  Completed   HPV Vaccine  Aged Out    Advanced directives: (In Chart) A copy of your advanced directives are scanned into your chart should your provider ever need it.  Next Medicare Annual Wellness Visit scheduled for next year: Yes Preventive Care 73 Years and Older, Female Preventive care refers to  lifestyle choices and visits with your health care provider that can promote health and wellness. Preventive care visits are also called wellness exams. What can I expect for my preventive care visit? Counseling Your health care provider may ask you questions about your: Medical history, including: Past medical problems. Family medical history. Pregnancy and menstrual history. History of falls. Current health, including: Memory and ability to understand (cognition). Emotional well-being. Home life and relationship well-being. Sexual activity and sexual health. Lifestyle, including: Alcohol, nicotine or tobacco, and drug use. Access to firearms. Diet, exercise, and sleep habits. Work and work Astronomer. Sunscreen use. Safety issues such as seatbelt and bike helmet use. Physical exam Your health care provider will check your: Height and weight. These may be used to calculate your BMI (body mass index). BMI is a measurement that tells if you are at a healthy weight. Waist circumference. This measures the distance around your waistline. This measurement also tells if you are at a healthy weight and may help predict your risk of certain diseases, such as type 2 diabetes and high blood pressure. Heart rate and blood pressure. Body temperature. Skin for abnormal spots. What immunizations do I need?  Vaccines are usually given at various ages, according to a schedule. Your health care provider will recommend vaccines for you based on your age, medical history, and lifestyle or other factors, such as travel or where you work. What tests do I need? Screening Your health care provider may recommend  screening tests for certain conditions. This may include: Lipid and cholesterol levels. Hepatitis C test. Hepatitis B test. HIV (human immunodeficiency virus) test. STI (sexually transmitted infection) testing, if you are at risk. Lung cancer screening. Colorectal cancer screening. Diabetes  screening. This is done by checking your blood sugar (glucose) after you have not eaten for a while (fasting). Mammogram. Talk with your health care provider about how often you should have regular mammograms. BRCA-related cancer screening. This may be done if you have a family history of breast, ovarian, tubal, or peritoneal cancers. Bone density scan. This is done to screen for osteoporosis. Talk with your health care provider about your test results, treatment options, and if necessary, the need for more tests. Follow these instructions at home: Eating and drinking  Eat a diet that includes fresh fruits and vegetables, whole grains, lean protein, and low-fat dairy products. Limit your intake of foods with high amounts of sugar, saturated fats, and salt. Take vitamin and mineral supplements as recommended by your health care provider. Do not drink alcohol if your health care provider tells you not to drink. If you drink alcohol: Limit how much you have to 0-1 drink a day. Know how much alcohol is in your drink. In the [place] one drink equals one 12 oz bottle of beer (355 mL), one 5 oz glass of wine (148 mL), or one 1 oz glass of hard liquor (44 mL). Lifestyle Brush your teeth every morning and night with fluoride toothpaste. Floss one time each day. Exercise for at least 30 minutes 5 or more days each week. Do not use any products that contain nicotine or tobacco. These products include cigarettes, chewing tobacco, and vaping devices, such as e-cigarettes. If you need help quitting, ask your health care provider. Do not use drugs. If you are sexually active, practice safe sex. Use a condom or other form of protection in order to prevent STIs. Take aspirin only as told by your health care provider. Make sure that you understand how much to take and what form to take. Work with your health care provider to find out whether it is safe and beneficial for you to take aspirin daily. Ask your health  care provider if you need to take a cholesterol-lowering medicine (statin). Find healthy ways to manage stress, such as: Meditation, yoga, or listening to music. Journaling. Talking to a trusted person. Spending time with friends and family. Minimize exposure to UV radiation to reduce your risk of skin cancer. Safety Always wear your seat belt while driving or riding in a vehicle. Do not drive: If you have been drinking alcohol. Do not ride with someone who has been drinking. When you are tired or distracted. While texting. If you have been using any mind-altering substances or drugs. Wear a helmet and other protective equipment during sports activities. If you have firearms in your house, make sure you follow all gun safety procedures. What's next? Visit your health care provider once a year for an annual wellness visit. Ask your health care provider how often you should have your eyes and teeth checked. Stay up to date on all vaccines. This information is not intended to replace advice given to you by your health care provider. Make sure you discuss any questions you have with your health care provider. Document Revised: 01/27/2021 Document Reviewed: 01/27/2021 Elsevier Patient Education  2024 [employer]. Understanding Your Risk for Falls Millions of people have serious injuries from falls each year. It is important to  understand your risk of falling. Talk with your health care provider about your risk and what you can do to lower it. If you do have a serious fall, make sure to tell your provider. Falling once raises your risk of falling again. How can falls affect me? Serious injuries from falls are common. These include: Broken bones, such as hip fractures. Head injuries, such as traumatic brain injuries (TBI) or concussions. A fear of falling can cause you to avoid activities and stay at home. This can make your muscles weaker and raise your risk for a fall. What can increase  my risk? There are a number of risk factors that increase your risk for falling. The more risk factors you have, the higher your risk of falling. Serious injuries from a fall happen most often to people who are older than 73 years old. Teenagers and young adults ages 64-29 are also at higher risk. Common risk factors include: Weakness in the lower body. Being generally weak or confused due to long-term (chronic) illness. Dizziness or balance problems. Poor vision. Medicines that cause dizziness or drowsiness. These may include: Medicines for your blood pressure, heart, anxiety, insomnia, or swelling (edema). Pain medicines. Muscle relaxants. Other risk factors include: Drinking alcohol. Having had a fall in the past. Having foot pain or wearing improper footwear. Working at a dangerous job. Having any of the following in your home: Tripping hazards, such as floor clutter or loose rugs. Poor lighting. Pets. Having dementia or memory loss. What actions can I take to lower my risk of falling?     Physical activity Stay physically fit. Do strength and balance exercises. Consider taking a regular class to build strength and balance. Yoga and tai chi are good options. Vision Have your eyes checked every year and your prescription for glasses or contacts updated as needed. Shoes and walking aids Wear non-skid shoes. Wear shoes that have rubber soles and low heels. Do not wear high heels. Do not walk around the house in socks or slippers. Use a cane or walker as told by your provider. Home safety Attach secure railings on both sides of your stairs. Install grab bars for your bathtub, shower, and toilet. Use a non-skid mat in your bathtub or shower. Attach bath mats securely with double-sided, non-slip rug tape. Use good lighting in all rooms. Keep a flashlight near your bed. Make sure there is a clear path from your bed to the bathroom. Use night-lights. Do not use throw rugs. Make  sure all carpeting is taped or tacked down securely. Remove all clutter from walkways and stairways, including extension cords. Repair uneven or broken steps and floors. Avoid walking on icy or slippery surfaces. Walk on the grass instead of on icy or slick sidewalks. Use ice melter to get rid of ice on walkways in the winter. Use a cordless phone. Questions to ask your health care provider Can you help me check my risk for a fall? Do any of my medicines make me more likely to fall? Should I take a vitamin D supplement? What exercises can I do to improve my strength and balance? Should I make an appointment to have my vision checked? Do I need a bone density test to check for weak bones (osteoporosis)? Would it help to use a cane or a walker? Where to find more information Centers for Disease Control and Prevention, [employer]: TonerPromos.no Community-Based Fall Prevention Programs: TonerPromos.no [employer] on Aging: BaseRingTones.pl Contact a health care provider if: You fall  at home. You are afraid of falling at home. You feel weak, drowsy, or dizzy. This information is not intended to replace advice given to you by your health care provider. Make sure you discuss any questions you have with your health care provider. Document Revised: 04/04/2022 Document Reviewed: 04/04/2022 Elsevier Patient Education  2024 [employer].

## 2023-04-18 NOTE — Progress Notes (Signed)
Because this visit was a virtual/telehealth visit,  certain criteria was not obtained, such a blood pressure, CBG if patient is a diabetic, and timed get up and go. Any medications not marked as "taking" was not mentioned during the medication reconciliation part of the visit. Any vitals not documented were not able to be obtained due to this being a telehealth visit. Vitals that have been documented are verbally provided by the patient.  Patient was unable to self-report a recent blood pressure reading due to a lack of equipment at home via telehealth.  Subjective:   Sarah Coleman is a 72 y.o. female who presents for Medicare Annual (Subsequent) preventive examination.  Visit Complete: Virtual  I connected with  Sarah Coleman on 04/18/23 by a audio enabled telemedicine application and verified that I am speaking with the correct person using two identifiers.  Patient Location: Home  Provider Location: Home Office  I discussed the limitations of evaluation and management by telemedicine. The patient expressed understanding and agreed to proceed.  Patient Medicare AWV questionnaire was completed by the patient on na; I have confirmed that all information answered by patient is correct and no changes since this date.  Review of Systems     Cardiac Risk Factors include: advanced age (>15men, >57 women);dyslipidemia;hypertension;obesity (BMI >30kg/m2);sedentary lifestyle     Objective:    Today's Vitals   04/18/23 1304 04/18/23 1305  Weight: 204 lb (92.5 kg)   Height: 5\' 6"  (1.676 m)   PainSc:  3    Body mass index is 32.93 kg/m.     04/18/2023    1:03 PM 01/31/2023   10:16 AM 08/02/2022   10:07 AM 04/25/2022    8:31 AM 04/11/2022    1:16 PM 09/27/2021    7:41 AM 04/06/2021    4:43 PM  Advanced Directives  Does Patient Have a Medical Advance Directive? Yes Yes Yes Yes No Yes Yes  Type of Estate agent of [place];Living will Healthcare Power of  [place];Living will Healthcare Power of [place];Living will Healthcare Power of [place];Living will  Healthcare Power of [place];Living will Healthcare Power of [place];Living will  Does patient want to make changes to medical advance directive? No - Patient declined No - Patient declined No - Patient declined No - Patient declined     Copy of Healthcare Power of Attorney in Chart? Yes - validated most recent copy scanned in chart (See row information) No - copy requested No - copy requested No - copy requested  No - copy requested   Would patient like information on creating a medical advance directive?  No - Patient declined No - Patient declined No - Patient declined No - Patient declined      Current Medications (verified) Outpatient Encounter Medications as of 04/18/2023  Medication Sig   acetaminophen (TYLENOL) 500 MG tablet Take 1,000 mg by mouth every 8 (eight) hours as needed for moderate pain or headache.   amLODipine (NORVASC) 5 MG tablet Take 1 tablet by mouth once daily   azelastine (ASTELIN) 0.1 % nasal spray Use 2 sprays in each nostril every 12 hours for 1 week; After that, you may use 1 spray in each nostril twice a day as needed for allergies/congestion   CALCIUM-MAGNESIUM-ZINC PO Take 1 tablet by mouth 2 (two) times daily.   chlorthalidone (HYGROTON) 25 MG tablet Take 1 tablet (25 mg total) by mouth daily.   cyclobenzaprine (FLEXERIL) 5 MG tablet TAKE 1 TABLET BY MOUTH TWICE DAILY AS  NEEDED FOR  NECK  SPASMS   estradiol (ESTRACE) 0.1 MG/GM vaginal cream USE 1 APPLICATORFUL VAGINALLY EVERY OTHER DAY AS NEEDED FOR  VAGINAL  DISCOMFORT   fenofibrate (TRICOR) 48 MG tablet Take 1 tablet by mouth once daily   ferrous sulfate 325 (65 FE) MG tablet Take 1 tablet (325 mg total) by mouth daily with breakfast.   fluticasone (FLONASE) 50 MCG/ACT nasal spray Use 2 spray(s) in each nostril once daily   lovastatin (MEVACOR) 40 MG tablet TAKE 1 TABLET BY MOUTH AT BEDTIME   montelukast  (SINGULAIR) 10 MG tablet Take 1 tablet (10 mg total) by mouth at bedtime.   pantoprazole (PROTONIX) 20 MG tablet Take 1 tablet by mouth once daily   potassium chloride SA (KLOR-CON M) 20 MEQ tablet TAKE 2 TABLETS BY MOUTH THREE TIMES DAILY   predniSONE (DELTASONE) 10 MG tablet Take 1 tablet (10 mg total) by mouth 2 (two) times daily with a meal.   solifenacin (VESICARE) 10 MG tablet Take 1 tablet (10 mg total) by mouth daily.   spironolactone (ALDACTONE) 100 MG tablet Take 1 tablet by mouth once daily   No facility-administered encounter medications on file as of 04/18/2023.    Allergies (verified) Banana, Cinnamon, Ibuprofen, Orange fruit [citrus], Other, Penicillins, and Sweet potato   History: Past Medical History:  Diagnosis Date   Allergic rhinitis    Allergy    Anemia    Arthritis    GERD (gastroesophageal reflux disease) 2013   Hyperlipidemia    Hypertension    Metabolic syndrome X 2000   Morbid obesity (HCC) 2000   Obesity    Prediabetes 2010   Renal insufficiency    Seizures (HCC)    as child; unknown etiology.   Vertigo    Past Surgical History:  Procedure Laterality Date   BIOPSY  04/11/2017   Procedure: BIOPSY;  Surgeon: West Bali, MD;  [facility];  Service: Endoscopy;;  gastric bx's   COLONOSCOPY N/A 10/07/2016   Procedure: COLONOSCOPY;  Surgeon: West Bali, MD;  [facility];  Service: Endoscopy;  Laterality: N/A;  9:30 AM   COLONOSCOPY WITH PROPOFOL N/A 11/01/2016   Procedure: COLONOSCOPY WITH PROPOFOL;  Surgeon: West Bali, MD;  [facility];  Service: Endoscopy;  Laterality: N/A;  830    COLONOSCOPY WITH PROPOFOL N/A 01/23/2018   Procedure: COLONOSCOPY WITH PROPOFOL;  Surgeon: West Bali, MD;  [facility];  Service: Endoscopy;  Laterality: N/A;  2:00pm - pt knows to arrive at 10:15   COLONOSCOPY WITH PROPOFOL N/A 09/27/2021   Procedure: COLONOSCOPY WITH PROPOFOL;  Surgeon: Lanelle Bal, DO;   [facility];  Service: Endoscopy;  Laterality: N/A;  8:30 / ASA 3   ESOPHAGOGASTRODUODENOSCOPY (EGD) WITH PROPOFOL N/A 04/11/2017   Procedure: ESOPHAGOGASTRODUODENOSCOPY (EGD) WITH PROPOFOL;  Surgeon: West Bali, MD;  [facility];  Service: Endoscopy;  Laterality: N/A;  10:45am   GERD     PARTIAL HYSTERECTOMY  1987   secondary to ovarian cyst    POLYPECTOMY  11/01/2016   Procedure: POLYPECTOMY;  Surgeon: West Bali, MD;  [facility];  Service: Endoscopy;;  colon   POLYPECTOMY  01/23/2018   Procedure: POLYPECTOMY;  Surgeon: West Bali, MD;  [facility];  Service: Endoscopy;;  ascending colon   SAVORY DILATION N/A 04/11/2017   Procedure: SAVORY DILATION;  Surgeon: West Bali, MD;  [facility];  Service: Endoscopy;  Laterality: N/A;   TUBAL LIGATION  1975   Family History  Problem Relation Age of Onset   Heart attack Mother    Heart disease Mother 61       massive heart attack   Prostate cancer Father    Colon cancer Father 74   Hypertension Sister    Ovarian cancer Sister    Cancer Brother 19       oral    Hypertension Brother    Heart disease Brother    Social History   Socioeconomic History   Marital status: Married    Spouse name: Not on file   Number of children: 5   Years of education: Not on file   Highest education level: Not on file  Occupational History   Occupation: retired   Tobacco Use   Smoking status: Former    Current packs/day: 0.00    Average packs/day: 1 pack/day for 3.0 years (3.0 ttl pk-yrs)    Types: Cigarettes    Start date: 08/16/1971    Quit date: 08/15/1974    Years since quitting: 48.7   Smokeless tobacco: Never  Vaping Use   Vaping status: Never Used  Substance and Sexual Activity   Alcohol use: No    Alcohol/week: 0.0 standard drinks of alcohol   Drug use: No   Sexual activity: Not Currently    Birth control/protection: Surgical    Comment: hyst  Other Topics  Concern   Not on file  Social History Narrative   Not on file   Social Determinants of Health   Financial Resource Strain: Low Risk  (04/18/2023)   Overall Financial Resource Strain (CARDIA)    Difficulty of Paying Living Expenses: Not hard at all  Food Insecurity: No Food Insecurity (04/18/2023)   Hunger Vital Sign    Worried About Running Out of Food in the Last Year: Never true    Ran Out of Food in the Last Year: Never true  Transportation Needs: No Transportation Needs (04/18/2023)   PRAPARE - Administrator, Civil Service (Medical): No    Lack of Transportation (Non-Medical): No  Physical Activity: Insufficiently Active (04/18/2023)   Exercise Vital Sign    Days of Exercise per Week: 3 days    Minutes of Exercise per Session: 30 min  Stress: No Stress Concern Present (04/18/2023)   [employer] of Occupational Health - Occupational Stress Questionnaire    Feeling of Stress : Not at all  Social Connections: Socially Integrated (04/18/2023)   Social Connection and Isolation Panel [NHANES]    Frequency of Communication with Friends and Family: More than three times a week    Frequency of Social Gatherings with Friends and Family: More than three times a week    Attends Religious Services: More than 4 times per year    Active Member of [employer] or Organizations: Yes    Attends Engineer, structural: More than 4 times per year    Marital Status: Married    Tobacco Counseling Counseling given: Yes   Clinical Intake:  Pre-visit preparation completed: Yes  Pain : 0-10 Pain Score: 3  Pain Type: Chronic pain Pain Location: Knee Pain Orientation: Left Pain Descriptors / Indicators: Aching, Constant Pain Onset: More than a month ago Pain Frequency: Constant     BMI - recorded: 32.93 Nutritional Status: BMI > 30  Obese Nutritional Risks: None Diabetes: No  How often do you need to have someone help you when you read  instructions, pamphlets, or other  written materials from your doctor or pharmacy?: 1 - Never  Interpreter Needed?: No  Information entered by :: Abby Amberlie Gaillard, CMA   Activities of Daily Living    04/18/2023    1:12 PM 04/20/2022    1:48 PM  In your present state of health, do you have any difficulty performing the following activities:  Hearing? 0 0  Vision? 0 0  Difficulty concentrating or making decisions? 0 0  Walking or climbing stairs? 0 0  Dressing or bathing? 0 0  Doing errands, shopping? 0 0  Preparing Food and eating ? N N  Using the Toilet? N N  In the past six months, have you accidently leaked urine? N N  Do you have problems with loss of bowel control? N N  Managing your Medications? N N  Managing your Finances? N N  Housekeeping or managing your Housekeeping? N N    Patient Care Team: Kerri Perches, MD as PCP - General Branch, Dorothe Pea, MD as PCP - Cardiology (Cardiology) Marcine Matar, MD as Consulting Physician (Urology) Wyline Mood, Dorothe Pea, MD as Consulting Physician (Cardiology) West Bali, MD (Inactive) as Consulting Physician (Gastroenterology) Doreatha Massed, MD as Medical Oncologist (Hematology)  Indicate any recent Medical Services you may have received from other than [facility] providers in the past year (date may be approximate).     Assessment:   This is a routine wellness examination for [place].  Hearing/Vision screen Hearing Screening - Comments:: Patient denies any hearing difficulties.   Vision Screening - Comments:: Wears rx glasses - referral placed today for eye doctor.   Dietary issues and exercise activities discussed:     Goals Addressed             This Visit's Progress    Patient Stated       Keep it moving. Remain active and independent.        Depression Screen    04/18/2023    1:07 PM 01/27/2023    1:06 PM 11/25/2022   11:09 AM 08/19/2022   10:59 AM 06/03/2022   10:59 AM 05/25/2022   10:55 AM 04/05/2022   10:05 AM  PHQ 2/9  Scores  PHQ - 2 Score 0 0 0 0 0 0 0    Fall Risk    04/18/2023    1:11 PM 01/27/2023    1:06 PM 11/25/2022   11:08 AM 08/19/2022   10:59 AM 06/03/2022   10:59 AM  Fall Risk   Falls in the past year? 1 1 0 0 0  Number falls in past yr: 0 0 0 0 0  Injury with Fall? 1 1 0 0 0  Risk for fall due to : History of fall(s);Impaired balance/gait;Impaired mobility History of fall(s)  History of fall(s) No Fall Risks  Follow up Falls prevention discussed;Education provided Falls evaluation completed  Falls evaluation completed Falls evaluation completed    MEDICARE RISK AT HOME: Medicare Risk at Home Any stairs in or around the home?: Yes If so, are there any without handrails?: No Home free of loose throw rugs in walkways, pet beds, electrical cords, etc?: Yes Adequate lighting in your home to reduce risk of falls?: Yes Life alert?: No Use of a cane, walker or w/c?: No Grab bars in the bathroom?: Yes Shower chair or bench in shower?: No Elevated toilet seat or a handicapped toilet?: No  TIMED UP AND GO:  Was the test performed?  No    Cognitive  Function:        04/18/2023    1:07 PM 04/20/2022    1:49 PM 04/06/2021    4:45 PM 04/03/2020   10:56 AM 11/29/2018    1:08 PM  6CIT Screen  What Year? 0 points 0 points 0 points 0 points 0 points  What month? 0 points 0 points 0 points 0 points 0 points  What time? 0 points 0 points 0 points 0 points 0 points  Count back from 20 0 points 0 points 0 points 0 points 0 points  Months in reverse 0 points 0 points 0 points 0 points 0 points  Repeat phrase 0 points 0 points 2 points 0 points 2 points  Total Score 0 points 0 points 2 points 0 points 2 points    Immunizations Immunization History  Administered Date(s) Administered   Fluad Quad(high Dose 65+) 04/15/2019, 04/15/2020, 04/27/2021, 05/04/2022   Influenza Whole 05/08/2007   Influenza,inj,Quad PF,6+ Mos 04/23/2013, 07/28/2014, 05/14/2015, 04/28/2016, 05/09/2017, 05/31/2018   Moderna  SARS-COV2 Booster Vaccination 06/26/2020, 01/22/2021, 05/31/2021   Moderna Sars-Covid-2 Vaccination 10/24/2019, 11/26/2019   PFIZER(Purple Top)SARS-COV-2 Vaccination 06/29/2022   Pneumococcal Conjugate-13 03/26/2014   Pneumococcal Polysaccharide-23 02/09/2016   Td 05/09/2006   Tdap 04/08/2022   Zoster Recombinant(Shingrix) 12/07/2021, 03/10/2022   Zoster, Live 11/01/2012    TDAP status: Up to date  Flu Vaccine status: Up to date  Pneumococcal vaccine status: Up to date  Covid-19 vaccine status: Information provided on how to obtain vaccines.   Qualifies for Shingles Vaccine? No   Zostavax completed Yes   Shingrix Completed?: Yes  Screening Tests Health Maintenance  Topic Date Due   DEXA SCAN  04/13/2022   INFLUENZA VACCINE  03/16/2023   COVID-19 Vaccine (4 - 2023-24 season) 04/16/2023   Medicare Annual Wellness (AWV)  04/21/2023   MAMMOGRAM  06/06/2024   Colonoscopy  09/27/2026   DTaP/Tdap/Td (3 - Td or Tdap) 04/08/2032   Pneumonia Vaccine 53+ Years old  Completed   Hepatitis C Screening  Completed   Zoster Vaccines- Shingrix  Completed   HPV VACCINES  Aged Out    Health Maintenance  Health Maintenance Due  Topic Date Due   DEXA SCAN  04/13/2022   INFLUENZA VACCINE  03/16/2023   COVID-19 Vaccine (4 - 2023-24 season) 04/16/2023   Medicare Annual Wellness (AWV)  04/21/2023    Colorectal cancer screening: Type of screening: Colonoscopy. Completed 09/27/2021. Repeat every 5 years  Mammogram status: Ordered 01/27/2023. Pt provided with contact info and advised to call to schedule appt.   Bone Density status: Ordered 04/18/2023. Pt provided with contact info and advised to call to schedule appt.  Lung Cancer Screening: (Low Dose CT Chest recommended if Age 49-80 years, 20 pack-year currently smoking OR have quit w/in 15years.) does not qualify.   Lung Cancer Screening Referral: na  Additional Screening:  Hepatitis C Screening: does not qualify; Completed  06/03/2015  Vision Screening: Recommended annual ophthalmology exams for early detection of glaucoma and other disorders of the eye. Is the patient up to date with their annual eye exam?  No  Who is the provider or what is the name of the office in which the patient attends annual eye exams? N/a If pt is not established with a provider, would they like to be referred to a provider to establish care? Yes .   Dental Screening: Recommended annual dental exams for proper oral hygiene  Diabetic Foot Exam: na  Community Resource Referral / Chronic Care Management: CRR required  this visit?  No   CCM required this visit?  No     Plan:     I have personally reviewed and noted the following in the patient's chart:   Medical and social history Use of alcohol, tobacco or illicit drugs  Current medications and supplements including opioid prescriptions. Patient is not currently taking opioid prescriptions. Functional ability and status Nutritional status Physical activity Advanced directives List of other physicians Hospitalizations, surgeries, and ER visits in previous 12 months Vitals Screenings to include cognitive, depression, and falls Referrals and appointments  In addition, I have reviewed and discussed with patient certain preventive protocols, quality metrics, and best practice recommendations. A written personalized care plan for preventive services as well as general preventive health recommendations were provided to patient.     Jordan Hawks Verta Riedlinger, CMA   04/18/2023   After Visit Summary: (Mail) Due to this being a telephonic visit, the after visit summary with patients personalized plan was offered to patient via mail   Nurse Notes:

## 2023-04-30 ENCOUNTER — Other Ambulatory Visit: Payer: Self-pay | Admitting: Family Medicine

## 2023-05-09 ENCOUNTER — Other Ambulatory Visit: Payer: Self-pay | Admitting: Family Medicine

## 2023-05-10 ENCOUNTER — Other Ambulatory Visit: Payer: Self-pay | Admitting: Family Medicine

## 2023-05-15 DIAGNOSIS — I5032 Chronic diastolic (congestive) heart failure: Secondary | ICD-10-CM | POA: Diagnosis not present

## 2023-05-15 DIAGNOSIS — I129 Hypertensive chronic kidney disease with stage 1 through stage 4 chronic kidney disease, or unspecified chronic kidney disease: Secondary | ICD-10-CM | POA: Diagnosis not present

## 2023-05-15 DIAGNOSIS — D472 Monoclonal gammopathy: Secondary | ICD-10-CM | POA: Diagnosis not present

## 2023-05-15 DIAGNOSIS — N189 Chronic kidney disease, unspecified: Secondary | ICD-10-CM | POA: Diagnosis not present

## 2023-05-15 DIAGNOSIS — N1832 Chronic kidney disease, stage 3b: Secondary | ICD-10-CM | POA: Diagnosis not present

## 2023-05-16 ENCOUNTER — Other Ambulatory Visit: Payer: Self-pay | Admitting: Family Medicine

## 2023-05-19 DIAGNOSIS — I5032 Chronic diastolic (congestive) heart failure: Secondary | ICD-10-CM | POA: Diagnosis not present

## 2023-05-19 DIAGNOSIS — I129 Hypertensive chronic kidney disease with stage 1 through stage 4 chronic kidney disease, or unspecified chronic kidney disease: Secondary | ICD-10-CM | POA: Diagnosis not present

## 2023-05-19 DIAGNOSIS — D472 Monoclonal gammopathy: Secondary | ICD-10-CM | POA: Diagnosis not present

## 2023-05-19 DIAGNOSIS — N1832 Chronic kidney disease, stage 3b: Secondary | ICD-10-CM | POA: Diagnosis not present

## 2023-05-24 ENCOUNTER — Other Ambulatory Visit: Payer: Self-pay | Admitting: Family Medicine

## 2023-05-29 DIAGNOSIS — I5032 Chronic diastolic (congestive) heart failure: Secondary | ICD-10-CM | POA: Diagnosis not present

## 2023-05-29 DIAGNOSIS — I129 Hypertensive chronic kidney disease with stage 1 through stage 4 chronic kidney disease, or unspecified chronic kidney disease: Secondary | ICD-10-CM | POA: Diagnosis not present

## 2023-05-29 DIAGNOSIS — D472 Monoclonal gammopathy: Secondary | ICD-10-CM | POA: Diagnosis not present

## 2023-05-29 DIAGNOSIS — N1832 Chronic kidney disease, stage 3b: Secondary | ICD-10-CM | POA: Diagnosis not present

## 2023-05-30 ENCOUNTER — Ambulatory Visit: Payer: PPO | Admitting: Family Medicine

## 2023-05-30 ENCOUNTER — Encounter: Payer: Self-pay | Admitting: Family Medicine

## 2023-05-30 VITALS — BP 124/82 | HR 82 | Ht 66.0 in | Wt 203.1 lb

## 2023-05-30 DIAGNOSIS — Z0001 Encounter for general adult medical examination with abnormal findings: Secondary | ICD-10-CM | POA: Diagnosis not present

## 2023-05-30 DIAGNOSIS — Z0000 Encounter for general adult medical examination without abnormal findings: Secondary | ICD-10-CM

## 2023-05-30 DIAGNOSIS — Z23 Encounter for immunization: Secondary | ICD-10-CM | POA: Diagnosis not present

## 2023-05-30 DIAGNOSIS — K219 Gastro-esophageal reflux disease without esophagitis: Secondary | ICD-10-CM | POA: Diagnosis not present

## 2023-05-30 DIAGNOSIS — H6502 Acute serous otitis media, left ear: Secondary | ICD-10-CM | POA: Diagnosis not present

## 2023-05-30 MED ORDER — PANTOPRAZOLE SODIUM 20 MG PO TBEC
20.0000 mg | DELAYED_RELEASE_TABLET | Freq: Every day | ORAL | 2 refills | Status: DC
Start: 2023-05-30 — End: 2023-12-18

## 2023-05-30 MED ORDER — CHLORTHALIDONE 25 MG PO TABS: 25.0000 mg | ORAL_TABLET | Freq: Every day | ORAL | 2 refills | Status: DC

## 2023-05-30 MED ORDER — AMLODIPINE BESYLATE 5 MG PO TABS: 5.0000 mg | ORAL_TABLET | Freq: Every day | ORAL | 2 refills | Status: DC

## 2023-05-30 MED ORDER — SPIRONOLACTONE 100 MG PO TABS: 100.0000 mg | ORAL_TABLET | Freq: Every day | ORAL | 2 refills | Status: DC

## 2023-05-30 MED ORDER — POTASSIUM CHLORIDE CRYS ER 20 MEQ PO TBCR: EXTENDED_RELEASE_TABLET | ORAL | 2 refills | Status: DC

## 2023-05-30 MED ORDER — AZELASTINE HCL 0.1 % NA SOLN
NASAL | 2 refills | Status: DC
Start: 2023-05-30 — End: 2024-01-02

## 2023-05-30 NOTE — Patient Instructions (Addendum)
F/u in 5 months, call if you need me sooner   Pls schedule mammogram and dexa at checkout ([facility])  Flu vaccine today  Nurse pls document recent covid vaccine try and retrieve labs from yesterday before she leaves so they can be reviewed in person, let me see them  No med changes  Thanks for choosing [facility], we consider it a privelige to serve you.

## 2023-05-30 NOTE — Assessment & Plan Note (Signed)
Annual exam as documented. . Immunization and cancer screening needs are specifically addressed at this visit.

## 2023-05-30 NOTE — Assessment & Plan Note (Signed)
After obtaining informed consent, the vaccine is  administered , with no adverse effect noted at the time of administration.  

## 2023-05-30 NOTE — Progress Notes (Signed)
Sarah Coleman     MRN: [id number]      DOB: 10/09/1950  Chief Complaint  Patient presents with   Annual Exam    CPE  flu shot    HPI: Patient is in for annual physical exam. No other health concerns are expressed or addressed at the visit.slight stress currently as roof needs to be replaced and seeking a loan for that Immunization is reviewed , and  updated   PE: BP 124/82 (BP Location: Right Arm, Patient Position: Sitting, Cuff Size: Large)   Pulse 82   Ht 5\' 6"  (1.676 m)   Wt 203 lb 1.9 oz (92.1 kg)   SpO2 93%   BMI 32.78 kg/m   Pleasant  female, alert and oriented x 3, in no cardio-pulmonary distress. Afebrile. HEENT No facial trauma or asymetry. Sinuses non tender.  Extra occullar muscles intact.. External ears normal, . Neck: supple, no adenopathy,JVD or thyromegaly.No bruits.  Chest: Clear to ascultation bilaterally.No crackles or wheezes. Non tender to palpation   Cardiovascular system; Heart sounds normal,  S1 and  S2 ,no S3.  No murmur, or thrill. Apical beat not displaced Peripheral pulses normal.  Abdomen: Soft, non tender, no organomegaly or masses. No bruits. Bowel sounds normal. No guarding, tenderness or rebound.     Musculoskeletal exam: Decreased  ROM of spine, hips , shoulders and knees. deformity ,swelling and  crepitus noted.knees No muscle wasting or atrophy.   Neurologic: Cranial nerves 2 to 12 intact. Power, tone ,sensation normal throughout. No disturbance in gait. No tremor.  Skin: Intact, no ulceration, erythema , scaling or rash noted. Pigmentation normal throughout  Psych; Normal mood and affect. Judgement and concentration normal   Assessment & Plan:  Encounter for annual physical exam Annual exam as documented. Immunization and cancer screening needs are specifically addressed at this visit.   Immunization due After obtaining informed consent, the vaccine is  administered , with no adverse effect noted at  the time of administration.

## 2023-05-31 DIAGNOSIS — I10 Essential (primary) hypertension: Secondary | ICD-10-CM | POA: Diagnosis not present

## 2023-05-31 DIAGNOSIS — E785 Hyperlipidemia, unspecified: Secondary | ICD-10-CM | POA: Diagnosis not present

## 2023-05-31 DIAGNOSIS — R7303 Prediabetes: Secondary | ICD-10-CM | POA: Diagnosis not present

## 2023-06-01 LAB — CBC WITH DIFFERENTIAL/PLATELET
Basophils Absolute: 0 10*3/uL (ref 0.0–0.2)
Basos: 0 %
EOS (ABSOLUTE): 0.1 10*3/uL (ref 0.0–0.4)
Eos: 1 %
Hematocrit: 39.4 % (ref 34.0–46.6)
Hemoglobin: 12.7 g/dL (ref 11.1–15.9)
Immature Grans (Abs): 0 10*3/uL (ref 0.0–0.1)
Immature Granulocytes: 0 %
Lymphocytes Absolute: 3 10*3/uL (ref 0.7–3.1)
Lymphs: 40 %
MCH: 31.8 pg (ref 26.6–33.0)
MCHC: 32.2 g/dL (ref 31.5–35.7)
MCV: 99 fL — ABNORMAL HIGH (ref 79–97)
Monocytes Absolute: 0.4 10*3/uL (ref 0.1–0.9)
Monocytes: 6 %
Neutrophils Absolute: 4 10*3/uL (ref 1.4–7.0)
Neutrophils: 53 %
Platelets: 352 10*3/uL (ref 150–450)
RBC: 4 x10E6/uL (ref 3.77–5.28)
RDW: 12.9 % (ref 11.7–15.4)
WBC: 7.6 10*3/uL (ref 3.4–10.8)

## 2023-06-01 LAB — CMP14+EGFR
ALT: 15 [IU]/L (ref 0–32)
AST: 14 [IU]/L (ref 0–40)
Albumin: 4.7 g/dL (ref 3.8–4.8)
Alkaline Phosphatase: 74 [IU]/L (ref 44–121)
BUN/Creatinine Ratio: 15 (ref 12–28)
BUN: 18 mg/dL (ref 8–27)
Bilirubin Total: 0.4 mg/dL (ref 0.0–1.2)
CO2: 20 mmol/L (ref 20–29)
Calcium: 10 mg/dL (ref 8.7–10.3)
Chloride: 102 mmol/L (ref 96–106)
Creatinine, Ser: 1.17 mg/dL — ABNORMAL HIGH (ref 0.57–1.00)
Globulin, Total: 2.5 g/dL (ref 1.5–4.5)
Glucose: 106 mg/dL — ABNORMAL HIGH (ref 70–99)
Potassium: 4.8 mmol/L (ref 3.5–5.2)
Sodium: 135 mmol/L (ref 134–144)
Total Protein: 7.2 g/dL (ref 6.0–8.5)
eGFR: 49 mL/min/{1.73_m2} — ABNORMAL LOW (ref >59)

## 2023-06-01 LAB — LIPID PANEL
Chol/HDL Ratio: 4.4 {ratio} (ref 0.0–4.4)
Cholesterol, Total: 164 mg/dL (ref 100–199)
HDL: 37 mg/dL — ABNORMAL LOW (ref >39)
LDL Chol Calc (NIH): 90 mg/dL (ref 0–99)
Triglycerides: 216 mg/dL — ABNORMAL HIGH (ref 0–149)
VLDL Cholesterol Cal: 37 mg/dL (ref 5–40)

## 2023-06-01 LAB — TSH: TSH: 1.56 u[IU]/mL (ref 0.450–4.500)

## 2023-06-01 LAB — HEMOGLOBIN A1C
Est. average glucose Bld gHb Est-mCnc: 117 mg/dL
Hgb A1c MFr Bld: 5.7 % — ABNORMAL HIGH (ref 4.8–5.6)

## 2023-06-05 MED ORDER — FENOFIBRATE 145 MG PO TABS: 145.0000 mg | ORAL_TABLET | Freq: Every day | ORAL | 4 refills | Status: DC

## 2023-06-05 NOTE — Addendum Note (Signed)
Addended by: Kerri Perches on: 06/05/2023 01:42 PM   Modules accepted: Orders

## 2023-06-14 ENCOUNTER — Ambulatory Visit (HOSPITAL_COMMUNITY)
Admission: RE | Admit: 2023-06-14 | Discharge: 2023-06-14 | Disposition: A | Discharge status: Home / Self Care | Payer: PPO | Source: Ambulatory Visit | Attending: Family Medicine | Admitting: Family Medicine

## 2023-06-14 ENCOUNTER — Other Ambulatory Visit: Payer: Self-pay | Admitting: Family Medicine

## 2023-06-14 ENCOUNTER — Encounter (HOSPITAL_COMMUNITY): Payer: Self-pay

## 2023-06-14 DIAGNOSIS — Z1231 Encounter for screening mammogram for malignant neoplasm of breast: Secondary | ICD-10-CM | POA: Diagnosis not present

## 2023-06-14 DIAGNOSIS — Z780 Asymptomatic menopausal state: Secondary | ICD-10-CM | POA: Diagnosis not present

## 2023-06-14 DIAGNOSIS — M8588 Other specified disorders of bone density and structure, other site: Secondary | ICD-10-CM | POA: Diagnosis not present

## 2023-06-23 ENCOUNTER — Other Ambulatory Visit: Payer: Self-pay | Admitting: Family Medicine

## 2023-07-04 ENCOUNTER — Other Ambulatory Visit: Payer: Self-pay | Admitting: Family Medicine

## 2023-07-05 ENCOUNTER — Other Ambulatory Visit: Payer: Self-pay

## 2023-07-05 MED ORDER — FLUTICASONE PROPIONATE 50 MCG/ACT NA SUSP: NASAL | 0 refills | Status: DC

## 2023-08-01 ENCOUNTER — Inpatient Hospital Stay: Payer: PPO | Attending: Hematology

## 2023-08-01 DIAGNOSIS — D472 Monoclonal gammopathy: Secondary | ICD-10-CM | POA: Insufficient documentation

## 2023-08-01 DIAGNOSIS — D696 Thrombocytopenia, unspecified: Secondary | ICD-10-CM | POA: Diagnosis not present

## 2023-08-01 DIAGNOSIS — M8580 Other specified disorders of bone density and structure, unspecified site: Secondary | ICD-10-CM | POA: Insufficient documentation

## 2023-08-01 DIAGNOSIS — E871 Hypo-osmolality and hyponatremia: Secondary | ICD-10-CM | POA: Insufficient documentation

## 2023-08-01 DIAGNOSIS — D75839 Thrombocytosis, unspecified: Secondary | ICD-10-CM | POA: Insufficient documentation

## 2023-08-01 LAB — CBC WITH DIFFERENTIAL/PLATELET
Abs Immature Granulocytes: 0.03 10*3/uL (ref 0.00–0.07)
Basophils Absolute: 0 10*3/uL (ref 0.0–0.1)
Basophils Relative: 0 %
Eosinophils Absolute: 0.1 10*3/uL (ref 0.0–0.5)
Eosinophils Relative: 1 %
HCT: 39.9 % (ref 36.0–46.0)
Hemoglobin: 13.3 g/dL (ref 12.0–15.0)
Immature Granulocytes: 0 %
Lymphocytes Relative: 50 %
Lymphs Abs: 3.7 10*3/uL (ref 0.7–4.0)
MCH: 31.3 pg (ref 26.0–34.0)
MCHC: 33.3 g/dL (ref 30.0–36.0)
MCV: 93.9 fL (ref 80.0–100.0)
Monocytes Absolute: 0.5 10*3/uL (ref 0.1–1.0)
Monocytes Relative: 7 %
Neutro Abs: 3.1 10*3/uL (ref 1.7–7.7)
Neutrophils Relative %: 42 %
Platelets: 334 10*3/uL (ref 150–400)
RBC: 4.25 MIL/uL (ref 3.87–5.11)
RDW: 12.6 % (ref 11.5–15.5)
WBC: 7.5 10*3/uL (ref 4.0–10.5)
nRBC: 0 % (ref 0.0–0.2)

## 2023-08-01 LAB — COMPREHENSIVE METABOLIC PANEL
ALT: 18 U/L (ref 0–44)
AST: 20 U/L (ref 15–41)
Albumin: 4.8 g/dL (ref 3.5–5.0)
Alkaline Phosphatase: 42 U/L (ref 38–126)
Anion gap: 11 (ref 5–15)
BUN: 20 mg/dL (ref 8–23)
CO2: 21 mmol/L — ABNORMAL LOW (ref 22–32)
Calcium: 10.2 mg/dL (ref 8.9–10.3)
Chloride: 98 mmol/L (ref 98–111)
Creatinine, Ser: 1.51 mg/dL — ABNORMAL HIGH (ref 0.44–1.00)
GFR, Estimated: 36 mL/min — ABNORMAL LOW (ref >60)
Glucose, Bld: 116 mg/dL — ABNORMAL HIGH (ref 70–99)
Potassium: 4 mmol/L (ref 3.5–5.1)
Sodium: 130 mmol/L — ABNORMAL LOW (ref 135–145)
Total Bilirubin: 0.4 mg/dL (ref <1.2)
Total Protein: 8.3 g/dL — ABNORMAL HIGH (ref 6.5–8.1)

## 2023-08-01 LAB — LACTATE DEHYDROGENASE: LDH: 117 U/L (ref 98–192)

## 2023-08-03 LAB — KAPPA/LAMBDA LIGHT CHAINS
Kappa free light chain: 15.4 mg/L (ref 3.3–19.4)
Kappa, lambda light chain ratio: 1.75 — ABNORMAL HIGH (ref 0.26–1.65)
Lambda free light chains: 8.8 mg/L (ref 5.7–26.3)

## 2023-08-06 NOTE — Progress Notes (Unsigned)
[facility] [address], [place] [address]   CLINIC:  Medical Oncology/Hematology  PCP:  Kerri Perches, MD [address], [address] [place] [address] [phone]   REASON FOR VISIT:  Follow-up for IgG kappa MGUS  PRIOR THERAPY: None  CURRENT THERAPY: Surveillance  INTERVAL HISTORY:   Sarah Coleman 72 y.o. female returns for routine follow-up of her IgG kappa MGUS.  She was last seen by Rojelio Brenner PA-C on 08/02/2022.  At today's visit, she reports feeling well.  No recent hospitalizations, surgeries, or changes in baseline health status.  She has some intermittent numbers of the fingers and toes along particular nerve distributions - these improved after she started taking magnesium.  She has chronic arthritic pain in her left knee, denies any new bone pain or recent fractures.   Her weight has been stable since her last visit.  She reports occasional hot flashes, but denies any fevers, chills, or night sweats.  No new neurologic symptoms such as tinnitus, new-onset hearing loss, blurred vision, headache, or dizziness.   No thromboembolic events since her last visit.  No new masses or lymphadenopathy per her report.   She has 65% energy and 100% appetite. She endorses that she is maintaining a stable weight.   ASSESSMENT & PLAN:  1.  IgG kappa MGUS - Work-up for CKD by Dr. Wolfgang Phoenix showed serum immunofixation with faint IgG kappa - Hematology work-up (04/11/2022): SPEP with M spike 0.4 Immunofixation confirms IgG kappa Mildly elevated kappa free light chains 22.9 with normal lambda free light chain 15.1 and normal ratio 1.52. Normal LDH and beta-2 microglobulin - 24-hour urine (04/27/2022): Normal urine immunofixation and urine protein levels.  No evidence of monoclonal protein in urine. - Skeletal survey (04/11/2022) with multiple questionable small/faint lucencies throughout the axial and appendicular skeleton, described in more detail in  radiology report - Most recent labs (01/24/2023): M spike stable at 0.3. Kappa/lambda free light chains stable with mildly elevated kappa light chain 22.2, normal lambda 18.6, normal ratio 1.14. Normal LDH. CMP with creatinine 1.40 (baseline CKD stage IIIb). Calcium 10.0 (stopped taking calcium supplements in December 2023)  Normal Hgb.  13.9. - Repeat skeletal survey (07/26/2022): Stable multifocal lytic lesions (5 mm lucency of parietal bone, 7 mm lucency of right scapula, lucencies within the right hemipelvis are not well-visualized, but appears stable measuring up to 7 mm); no new lesions identified - No new bone pain or B symptoms.   Intermittent numbness/tingling of her left foot second through fifth toes and left hand fourth and fifth fingers.  (She is nondiabetic). - PLAN: No evidence of myeloma at this time, though she still needs to check skeletal survey that was requested for 35-month follow-up (if stable, will transition to annual skeletal survey after this) - Will recheck her MGUS/myeloma labs in 6 months with office visit 1 week after.   2.  Thrombocytosis and lymphocytosis, mild - CBC from 04/11/2022 shows mildly elevated platelets 422 and mild lymphocytosis 4.4 - Most recent CBC (01/24/2023) is normal - PLAN: We will continue active surveillance and consider additional workup if necessary   3.  Social/family history: - She lives at home with her husband.  She retired from working at [employer].  Non-smoker.  Uses lots of rate/backstop sprays.  No other chemical exposure. - Maternal aunt had leukemia.  Father had colon cancer.  Sister had uterine cancer.    PLAN SUMMARY: >> Skeletal survey TODAY >> Labs in 6  months = CBC/D, CMP, LDH, SPEP, free light chains >> OFFICE visit in 6 months (1 week after labs)     REVIEW OF SYSTEMS:   Review of Systems  Constitutional:  Positive for fatigue. Negative for appetite change, chills, diaphoresis, fever and unexpected weight  change.  HENT:   Negative for lump/mass and nosebleeds.   Eyes:  Negative for eye problems.  Respiratory:  Negative for cough, hemoptysis and shortness of breath.   Cardiovascular:  Negative for chest pain, leg swelling and palpitations.  Gastrointestinal:  Positive for constipation. Negative for abdominal pain, blood in stool, diarrhea, nausea and vomiting.  Genitourinary:  Negative for hematuria.   Musculoskeletal:  Positive for arthralgias.  Skin: Negative.   Neurological:  Positive for headaches. Negative for dizziness and light-headedness.  Hematological:  Does not bruise/bleed easily.     PHYSICAL EXAM:  ECOG PERFORMANCE STATUS: 1 - Symptomatic but completely ambulatory  There were no vitals filed for this visit. There were no vitals filed for this visit. Physical Exam Constitutional:      Appearance: Normal appearance. She is obese.  Cardiovascular:     Heart sounds: Normal heart sounds.  Pulmonary:     Breath sounds: Normal breath sounds.  Neurological:     General: No focal deficit present.     Mental Status: Mental status is at baseline.  Psychiatric:        Behavior: Behavior normal. Behavior is cooperative.     PAST MEDICAL/SURGICAL HISTORY:  Past Medical History:  Diagnosis Date   Allergic rhinitis    Allergy    Anemia    Arthritis    GERD (gastroesophageal reflux disease) 2013   Hyperlipidemia    Hypertension    Metabolic syndrome X 2000   Morbid obesity (HCC) 2000   Obesity    Prediabetes 2010   Renal insufficiency    Seizures (HCC)    as child; unknown etiology.   Vertigo    Past Surgical History:  Procedure Laterality Date   BIOPSY  04/11/2017   Procedure: BIOPSY;  Surgeon: West Bali, MD;  [facility];  Service: Endoscopy;;  gastric bx's   COLONOSCOPY N/A 10/07/2016   Procedure: COLONOSCOPY;  Surgeon: West Bali, MD;  [facility];  Service: Endoscopy;  Laterality: N/A;  9:30 AM   COLONOSCOPY WITH PROPOFOL N/A  11/01/2016   Procedure: COLONOSCOPY WITH PROPOFOL;  Surgeon: West Bali, MD;  [facility];  Service: Endoscopy;  Laterality: N/A;  830    COLONOSCOPY WITH PROPOFOL N/A 01/23/2018   Procedure: COLONOSCOPY WITH PROPOFOL;  Surgeon: West Bali, MD;  [facility];  Service: Endoscopy;  Laterality: N/A;  2:00pm - pt knows to arrive at 10:15   COLONOSCOPY WITH PROPOFOL N/A 09/27/2021   Procedure: COLONOSCOPY WITH PROPOFOL;  Surgeon: Lanelle Bal, DO;  [facility];  Service: Endoscopy;  Laterality: N/A;  8:30 / ASA 3   ESOPHAGOGASTRODUODENOSCOPY (EGD) WITH PROPOFOL N/A 04/11/2017   Procedure: ESOPHAGOGASTRODUODENOSCOPY (EGD) WITH PROPOFOL;  Surgeon: West Bali, MD;  [facility];  Service: Endoscopy;  Laterality: N/A;  10:45am   GERD     PARTIAL HYSTERECTOMY  1987   secondary to ovarian cyst    POLYPECTOMY  11/01/2016   Procedure: POLYPECTOMY;  Surgeon: West Bali, MD;  [facility];  Service: Endoscopy;;  colon   POLYPECTOMY  01/23/2018   Procedure: POLYPECTOMY;  Surgeon: West Bali, MD;  [facility];  Service: Endoscopy;;  ascending colon   SAVORY DILATION N/A 04/11/2017   Procedure: SAVORY DILATION;  Surgeon: West Bali, MD;  [facility];  Service: Endoscopy;  Laterality: N/A;   TUBAL LIGATION  1975    SOCIAL HISTORY:  Social History   Socioeconomic History   Marital status: Married    Spouse name: Not on file   Number of children: 5   Years of education: Not on file   Highest education level: Not on file  Occupational History   Occupation: retired   Tobacco Use   Smoking status: Former    Current packs/day: 0.00    Average packs/day: 1 pack/day for 3.0 years (3.0 ttl pk-yrs)    Types: Cigarettes    Start date: 08/16/1971    Quit date: 08/15/1974    Years since quitting: 49.0   Smokeless tobacco: Never  Vaping Use   Vaping status: Never Used  Substance and Sexual Activity    Alcohol use: No    Alcohol/week: 0.0 standard drinks of alcohol   Drug use: No   Sexual activity: Not Currently    Birth control/protection: Surgical    Comment: hyst  Other Topics Concern   Not on file  Social History Narrative   Not on file   Social Drivers of Health   Financial Resource Strain: Low Risk  (04/18/2023)   Overall Financial Resource Strain (CARDIA)    Difficulty of Paying Living Expenses: Not hard at all  Food Insecurity: No Food Insecurity (04/18/2023)   Hunger Vital Sign    Worried About Running Out of Food in the Last Year: Never true    Ran Out of Food in the Last Year: Never true  Transportation Needs: No Transportation Needs (04/18/2023)   PRAPARE - Administrator, Civil Service (Medical): No    Lack of Transportation (Non-Medical): No  Physical Activity: Insufficiently Active (04/18/2023)   Exercise Vital Sign    Days of Exercise per Week: 3 days    Minutes of Exercise per Session: 30 min  Stress: No Stress Concern Present (04/18/2023)   [employer] of Occupational Health - Occupational Stress Questionnaire    Feeling of Stress : Not at all  Social Connections: Socially Integrated (04/18/2023)   Social Connection and Isolation Panel [NHANES]    Frequency of Communication with Friends and Family: More than three times a week    Frequency of Social Gatherings with Friends and Family: More than three times a week    Attends Religious Services: More than 4 times per year    Active Member of [employer] or Organizations: Yes    Attends Engineer, structural: More than 4 times per year    Marital Status: Married  Catering manager Violence: Not At Risk (04/18/2023)   Humiliation, Afraid, Rape, and Kick questionnaire    Fear of Current or Ex-Partner: No    Emotionally Abused: No    Physically Abused: No    Sexually Abused: No    FAMILY HISTORY:  Family History  Problem Relation Age of Onset   Heart attack Mother    Heart disease Mother 28        massive heart attack   Prostate cancer Father    Colon cancer Father 62   Hypertension Sister    Ovarian cancer Sister    Cancer Brother 52       oral    Hypertension Brother    Heart disease Brother  CURRENT MEDICATIONS:  Outpatient Encounter Medications as of 08/08/2023  Medication Sig   acetaminophen (TYLENOL) 500 MG tablet Take 1,000 mg by mouth every 8 (eight) hours as needed for moderate pain or headache.   amLODipine (NORVASC) 5 MG tablet Take 1 tablet (5 mg total) by mouth daily.   azelastine (ASTELIN) 0.1 % nasal spray Use 2 sprays in each nostril every 12 hours for 1 week; After that, you may use 1 spray in each nostril twice a day as needed for allergies/congestion   CALCIUM-MAGNESIUM-ZINC PO Take 1 tablet by mouth 2 (two) times daily.   chlorthalidone (HYGROTON) 25 MG tablet Take 1 tablet (25 mg total) by mouth daily.   cyclobenzaprine (FLEXERIL) 5 MG tablet TAKE 1 TABLET BY MOUTH TWICE DAILY AS NEEDED FOR  NECK  SPASMS   estradiol (ESTRACE) 0.1 MG/GM vaginal cream USE 1 APPLICATORFUL VAGINALLY EVERY OTHER DAY AS NEEDED FOR  VAGINAL  DISCOMFORT   fenofibrate (TRICOR) 145 MG tablet Take 1 tablet (145 mg total) by mouth daily.   Ferrous Sulfate (IRON) 325 (65 Fe) MG TABS Take 1 tablet by mouth once daily with breakfast   fluticasone (FLONASE) 50 MCG/ACT nasal spray Use 2 spray(s) in each nostril once daily   lovastatin (MEVACOR) 40 MG tablet TAKE 1 TABLET BY MOUTH AT BEDTIME   montelukast (SINGULAIR) 10 MG tablet TAKE 1 TABLET BY MOUTH AT BEDTIME   pantoprazole (PROTONIX) 20 MG tablet Take 1 tablet (20 mg total) by mouth daily.   potassium chloride SA (KLOR-CON M) 20 MEQ tablet TAKE 2 TABLETS BY MOUTH THREE TIMES DAILY   solifenacin (VESICARE) 10 MG tablet Take 1 tablet (10 mg total) by mouth daily.   spironolactone (ALDACTONE) 100 MG tablet Take 1 tablet (100 mg total) by mouth daily.   No facility-administered encounter medications on file as of 08/08/2023.     ALLERGIES:  Allergies  Allergen Reactions   Banana Swelling   Cinnamon Swelling   Ibuprofen Swelling   Orange Fruit [Citrus] Swelling    Pt states she found out that she was allergic to oranges around may 2023 has swelling reations.   Other     Orange   Penicillins Nausea And Vomiting    Has patient had a PCN reaction causing immediate rash, facial/tongue/throat swelling, SOB or lightheadedness with hypotension:No Has patient had a PCN reaction causing severe rash involving mucus membranes or skin necrosis:No Has patient had a PCN reaction that required hospitalization:No Has patient had a PCN reaction occurring within the last 10 years:No If all of the above answers are "NO", then may proceed with Cephalosporin use.    Sweet Potato Swelling    LABORATORY DATA:  I have reviewed the labs as listed.  CBC    Component Value Date/Time   WBC 7.5 08/01/2023 1006   RBC 4.25 08/01/2023 1006   HGB 13.3 08/01/2023 1006   HGB 12.7 05/31/2023 0803   HCT 39.9 08/01/2023 1006   HCT 39.4 05/31/2023 0803   PLT 334 08/01/2023 1006   PLT 352 05/31/2023 0803   MCV 93.9 08/01/2023 1006   MCV 99 (H) 05/31/2023 0803   MCH 31.3 08/01/2023 1006   MCHC 33.3 08/01/2023 1006   RDW 12.6 08/01/2023 1006   RDW 12.9 05/31/2023 0803   LYMPHSABS 3.7 08/01/2023 1006   LYMPHSABS 3.0 05/31/2023 0803   MONOABS 0.5 08/01/2023 1006   EOSABS 0.1 08/01/2023 1006   EOSABS 0.1 05/31/2023 0803   BASOSABS 0.0 08/01/2023 1006   BASOSABS 0.0 05/31/2023  2956      Latest Ref Rng & Units 08/01/2023   10:06 AM 05/31/2023    8:03 AM 01/24/2023   10:17 AM  CMP  Glucose 70 - 99 mg/dL 213  086  578   BUN 8 - 23 mg/dL 20  18  21    Creatinine 0.44 - 1.00 mg/dL 4.69  6.29  5.28   Sodium 135 - 145 mmol/L 130  135  130   Potassium 3.5 - 5.1 mmol/L 4.0  4.8  4.2   Chloride 98 - 111 mmol/L 98  102  97   CO2 22 - 32 mmol/L 21  20  23    Calcium 8.9 - 10.3 mg/dL 41.3  24.4  01.0   Total Protein 6.5 - 8.1 g/dL 8.3   7.2  8.6   Total Bilirubin <1.2 mg/dL 0.4  0.4  0.7   Alkaline Phos 38 - 126 U/L 42  74  64   AST 15 - 41 U/L 20  14  17    ALT 0 - 44 U/L 18  15  19      DIAGNOSTIC IMAGING:  I have independently reviewed the relevant imaging and discussed with the patient.   WRAP UP:  All questions were answered. The patient knows to call the clinic with any problems, questions or concerns.  Medical decision making: Moderate  Time spent on visit: I spent 20 minutes counseling the patient face to face. The total time spent in the appointment was 30 minutes and more than 50% was on counseling.  Mauro Kaufmann, NP  08/06/23 8:12 AM

## 2023-08-08 ENCOUNTER — Inpatient Hospital Stay (HOSPITAL_BASED_OUTPATIENT_CLINIC_OR_DEPARTMENT_OTHER): Payer: PPO | Admitting: Oncology

## 2023-08-08 ENCOUNTER — Encounter: Payer: Self-pay | Admitting: Oncology

## 2023-08-08 DIAGNOSIS — E871 Hypo-osmolality and hyponatremia: Secondary | ICD-10-CM

## 2023-08-08 DIAGNOSIS — D472 Monoclonal gammopathy: Secondary | ICD-10-CM

## 2023-08-08 DIAGNOSIS — D75839 Thrombocytosis, unspecified: Secondary | ICD-10-CM

## 2023-08-08 NOTE — Progress Notes (Unsigned)
Virtual Visit via Telephone Note  I connected with Sarah Coleman on 08/08/23 at 10:00 AM EST by telephone and verified that I am speaking with the correct person using two identifiers.  Location: Patient: Home Provider: Clinic    I discussed the limitations, risks, security and privacy concerns of performing an evaluation and management service by telephone and the availability of in person appointments. I also discussed with the patient that there may be a patient responsible charge related to this service. The patient expressed understanding and agreed to proceed.   History of Present Illness: Sarah Coleman is a 72 year old female with past medical history significant for MGUS and thrombocytopenia who presents today for follow-up.  She was last seen in clinic on 01/31/2023 by Rojelio Brenner, PA.  She denies any surgeries, hospitalizations or changes to her baseline health.  Had annual mammogram on 06/14/2023 which was read as BI-RADS Category 1 negative.  Had a bone density on 06/14/2023 which showed a T-score of -1.9 which is considered osteopenic.  She is currently taking calcium and vitamin D.  She had a bone survey on 01/31/2023 which showed subtle areas of lucency in the bones which are similar to previous examination.  No suspicious lucent bone lesions.  She denies any B symptoms including unintentional weight loss change in appetite or night sweats.  Observations/Objective: Review of Systems  Constitutional:  Negative for malaise/fatigue.  Gastrointestinal:  Negative for abdominal pain.  Musculoskeletal:  Negative for back pain and joint pain.  Neurological:  Negative for dizziness.    Physical Exam Neurological:     Mental Status: She is alert and oriented to person, place, and time.      Assessment and Plan: 1. MGUS (monoclonal gammopathy of unknown significance) (Primary) -Labs from 08/01/2023 show a normal CBC and differential.  Hemoglobin is 13.3.  CMP shows a  calcium level of 10.2 and creatinine 1.51.  Kidney function is slightly up from previous.  She denies any new bone pain.  Kappa lambda light chains are WNL but kappa lambda light chain ratio is slightly elevated which can be seen in the presence of CKD.  LDH is normal at 117.  Protein electrophoresis is still pending.  Will call patient with results. -Skeletal survey from 01/31/2023 showed subtle areas of lucency in the bones which were similar to previous examination.  No new suspicious lucent bone lesions.  Repeat bone scan annually (next due June 2025). -No concern for progression to multiple myeloma at this time. -Recommend continued surveillance.  2.  Hyponatremia -Sodium level is 130 from most recent lab draw.  We discussed increasing hydration and incorporating Gatorade or propel over the next couple of weeks.  3.  Thrombocytosis -Resolved.   Follow Up Instructions: Return to clinic in 6 months with labs a few days before.  I discussed the assessment and treatment plan with the patient. The patient was provided an opportunity to ask questions and all were answered. The patient agreed with the plan and demonstrated an understanding of the instructions.   The patient was advised to call back or seek an in-person evaluation if the symptoms worsen or if the condition fails to improve as anticipated.  I provided 20 minutes of non-face-to-face time during this encounter.   Mauro Kaufmann, NP

## 2023-08-10 LAB — PROTEIN ELECTROPHORESIS, SERUM
A/G Ratio: 1.4 (ref 0.7–1.7)
Albumin ELP: 4.4 g/dL (ref 2.9–4.4)
Alpha-1-Globulin: 0.3 g/dL (ref 0.0–0.4)
Alpha-2-Globulin: 0.8 g/dL (ref 0.4–1.0)
Beta Globulin: 1.2 g/dL (ref 0.7–1.3)
Gamma Globulin: 0.9 g/dL (ref 0.4–1.8)
Globulin, Total: 3.2 g/dL (ref 2.2–3.9)
M-Spike, %: 0.3 g/dL — ABNORMAL HIGH
Total Protein ELP: 7.6 g/dL (ref 6.0–8.5)

## 2023-08-28 NOTE — Progress Notes (Signed)
 History of Present Illness: Sarah Coleman is a 73 y.o. year old female here for continued f/u of recurrent UTIs/atrophic vaginitis and OAB sx's. She is on estogen crean 2 nights/week as well as solifenacin  3 nights/week.  She is doing fine with the above regimen.  She has had no urinary tract infections over the past year.  Some dry mouth from the Solifenacin .  She has constipation but attributes that to the iron supplement she takes.  She still uses the Solifenacin  3 times a week, the estrogen cream 2 nights a week.    Past Medical History:  Diagnosis Date   Allergic rhinitis    Allergy    Anemia    Arthritis    GERD (gastroesophageal reflux disease) 2013   Hyperlipidemia    Hypertension    Metabolic syndrome X 2000   Morbid obesity (HCC) 2000   Obesity    Prediabetes 2010   Renal insufficiency    Seizures (HCC)    as child; unknown etiology.   Vertigo     Past Surgical History:  Procedure Laterality Date   BIOPSY  04/11/2017   Procedure: BIOPSY;  Surgeon: Harvey Margo CROME, MD;  [facility];  Service: Endoscopy;;  gastric bx's   COLONOSCOPY N/A 10/07/2016   Procedure: COLONOSCOPY;  Surgeon: Margo CROME Harvey, MD;  [facility];  Service: Endoscopy;  Laterality: N/A;  9:30 AM   COLONOSCOPY WITH PROPOFOL  N/A 11/01/2016   Procedure: COLONOSCOPY WITH PROPOFOL ;  Surgeon: Margo CROME Harvey, MD;  [facility];  Service: Endoscopy;  Laterality: N/A;  830    COLONOSCOPY WITH PROPOFOL  N/A 01/23/2018   Procedure: COLONOSCOPY WITH PROPOFOL ;  Surgeon: Harvey Margo CROME, MD;  [facility];  Service: Endoscopy;  Laterality: N/A;  2:00pm - pt knows to arrive at 10:15   COLONOSCOPY WITH PROPOFOL  N/A 09/27/2021   Procedure: COLONOSCOPY WITH PROPOFOL ;  Surgeon: Cindie Carlin POUR, DO;  [facility];  Service: Endoscopy;  Laterality: N/A;  8:30 / ASA 3   ESOPHAGOGASTRODUODENOSCOPY (EGD) WITH PROPOFOL  N/A 04/11/2017   Procedure: ESOPHAGOGASTRODUODENOSCOPY  (EGD) WITH PROPOFOL ;  Surgeon: Harvey Margo CROME, MD;  [facility];  Service: Endoscopy;  Laterality: N/A;  10:45am   GERD     PARTIAL HYSTERECTOMY  1987   secondary to ovarian cyst    POLYPECTOMY  11/01/2016   Procedure: POLYPECTOMY;  Surgeon: Margo CROME Harvey, MD;  [facility];  Service: Endoscopy;;  colon   POLYPECTOMY  01/23/2018   Procedure: POLYPECTOMY;  Surgeon: Harvey Margo CROME, MD;  [facility];  Service: Endoscopy;;  ascending colon   SAVORY DILATION N/A 04/11/2017   Procedure: SAVORY DILATION;  Surgeon: Harvey Margo CROME, MD;  [facility];  Service: Endoscopy;  Laterality: N/A;   TUBAL LIGATION  1975    Home Medications:  (Not in a hospital admission)   Allergies:  Allergies  Allergen Reactions   Banana Swelling   Cinnamon Swelling   Ibuprofen Swelling   Orange Fruit [Citrus] Swelling    Pt states she found out that she was allergic to oranges around may 2023 has swelling reations.   Other     Orange   Penicillins Nausea And Vomiting    Has patient had a PCN reaction causing immediate rash, facial/tongue/throat swelling, SOB or lightheadedness with hypotension:No Has patient had a PCN reaction causing severe rash involving mucus membranes or skin necrosis:No Has patient had a PCN reaction that required  hospitalization:No Has patient had a PCN reaction occurring within the last 10 years:No If all of the above answers are NO, then may proceed with Cephalosporin use.    Sweet Potato Swelling    Family History  Problem Relation Age of Onset   Heart attack Mother    Heart disease Mother 52       massive heart attack   Prostate cancer Father    Colon cancer Father 47   Hypertension Sister    Ovarian cancer Sister    Cancer Brother 85       oral    Hypertension Brother    Heart disease Brother     Social History:  reports that she quit smoking about 49 years ago. Her smoking use included cigarettes. She started smoking  about 52 years ago. She has a 3 pack-year smoking history. She has never used smokeless tobacco. She reports that she does not drink alcohol  and does not use drugs.  ROS: A complete review of systems was performed.  All systems are negative except for pertinent findings as noted.  Physical Exam:  Vital signs in last 24 hours: @VSRANGES @ General:  Alert and oriented, No acute distress HEENT: Normocephalic, atraumatic Neck: No JVD or lymphadenopathy Cardiovascular: Regular rate  Lungs: Normal inspiratory/expiratory excursion Extremities: No edema Neurologic: Grossly intact  I have reviewed prior pt notes  I have reviewed urinalysis results  I have independently reviewed prior imaging--she had a nuclear medicine renogram in 2023 that was normal.  Additionally, renal ultrasound normal.  Impression/Assessment:  Vaginal atrophic changes-on estrogen cream, tolerating well.  No urinary tract infections over the past year  Overactive bladder symptoms, doing well on modified dose of Solifenacin   Plan:  At this point, I do not think she needs regular follow-up.  I would recommend continuing the estrogen cream Endosol within the long-term  She will ask Dr. Antonetta to refill these in the future  She will come in as needed

## 2023-08-29 ENCOUNTER — Encounter: Payer: Self-pay | Admitting: Urology

## 2023-08-29 ENCOUNTER — Ambulatory Visit: Payer: PPO | Admitting: Urology

## 2023-08-29 VITALS — BP 133/75 | HR 85

## 2023-08-29 DIAGNOSIS — R3915 Urgency of urination: Secondary | ICD-10-CM | POA: Diagnosis not present

## 2023-08-29 DIAGNOSIS — N952 Postmenopausal atrophic vaginitis: Secondary | ICD-10-CM

## 2023-08-29 LAB — URINALYSIS, ROUTINE W REFLEX MICROSCOPIC
Bilirubin, UA: NEGATIVE
Glucose, UA: NEGATIVE
Ketones, UA: NEGATIVE
Leukocytes,UA: NEGATIVE
Nitrite, UA: NEGATIVE
Protein,UA: NEGATIVE
Specific Gravity, UA: 1.015 (ref 1.005–1.030)
Urobilinogen, Ur: 0.2 mg/dL (ref 0.2–1.0)
pH, UA: 6 (ref 5.0–7.5)

## 2023-08-29 LAB — MICROSCOPIC EXAMINATION
Bacteria, UA: NONE SEEN
WBC, UA: NONE SEEN /[HPF] (ref 0–5)

## 2023-08-29 MED ORDER — SOLIFENACIN SUCCINATE 10 MG PO TABS: 10.0000 mg | ORAL_TABLET | Freq: Every day | ORAL | 3 refills | Status: DC

## 2023-08-29 MED ORDER — ESTRADIOL 0.1 MG/GM VA CREA: TOPICAL_CREAM | VAGINAL | 3 refills | Status: DC

## 2023-08-30 IMAGING — MG MM DIGITAL SCREENING BILAT W/ TOMO AND CAD
8 of 14 series · 8 of 40 positions shown · non-contrast
Comparison: Previous exam(s).

CLINICAL DATA: Screening.

EXAM:
DIGITAL SCREENING BILATERAL MAMMOGRAM WITH TOMOSYNTHESIS AND CAD
TECHNIQUE: Bilateral screening digital craniocaudal and mediolateral oblique
mammograms were obtained. Bilateral screening digital breast
tomosynthesis was performed. The images were evaluated with
computer-aided detection.

[L MLO synth-2D]
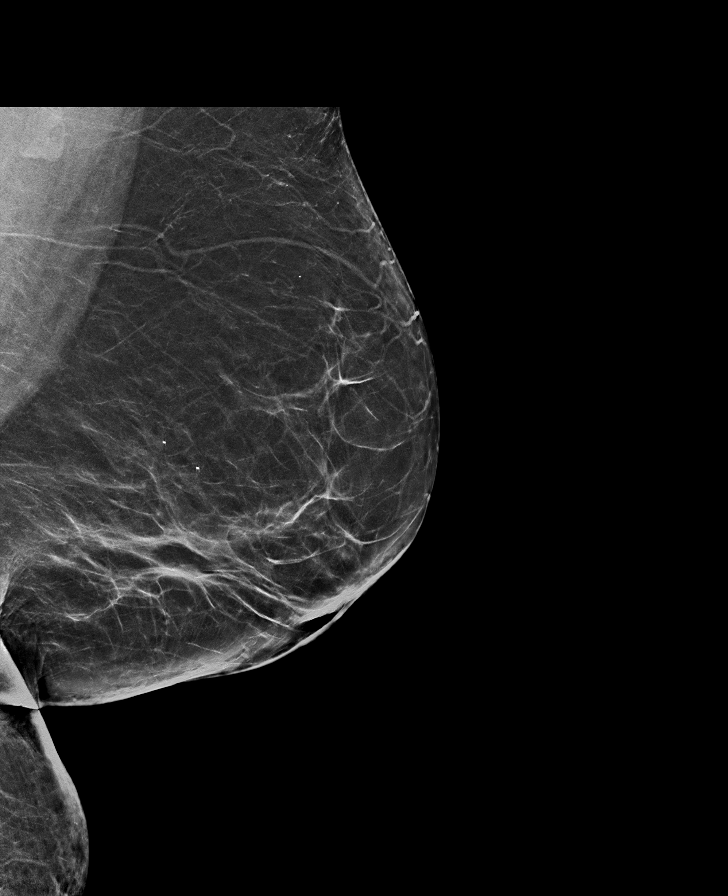

[R MLO synth-2D (1 of 2)]
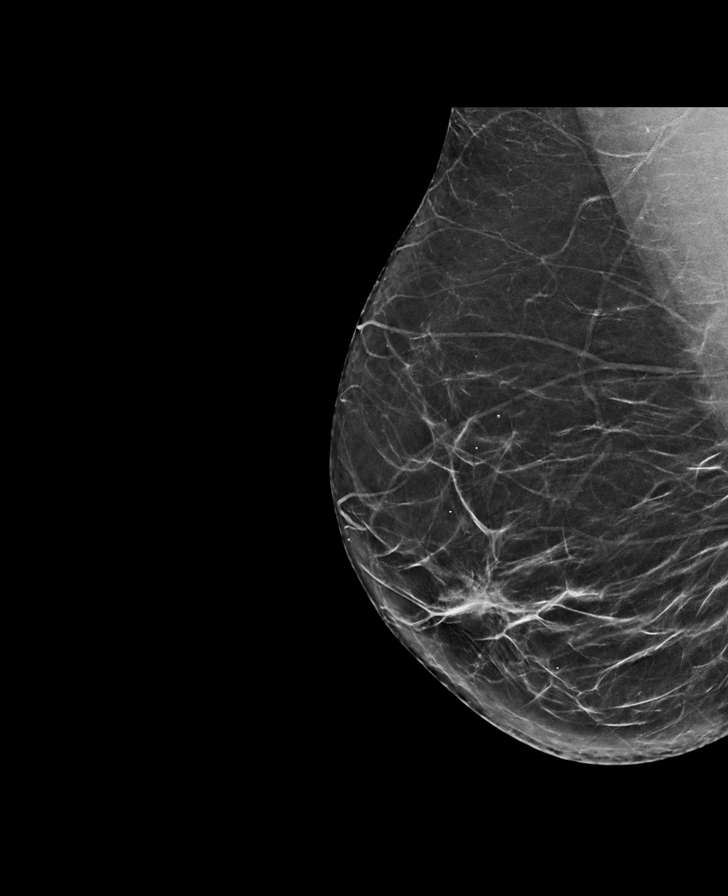

[L CC synth-2D (1 of 2)]
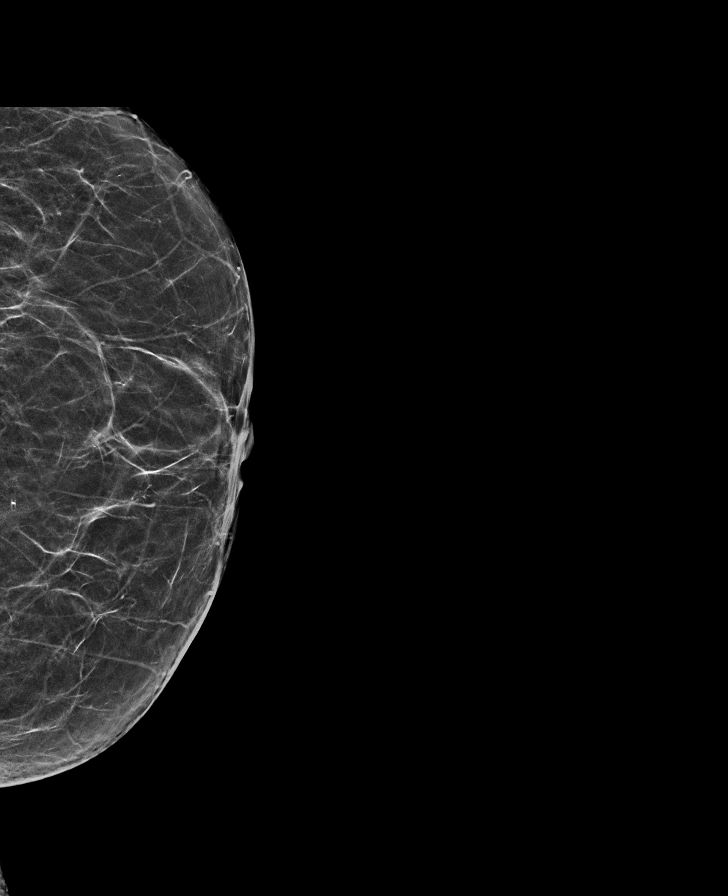

[L CC synth-2D (2 of 2)]
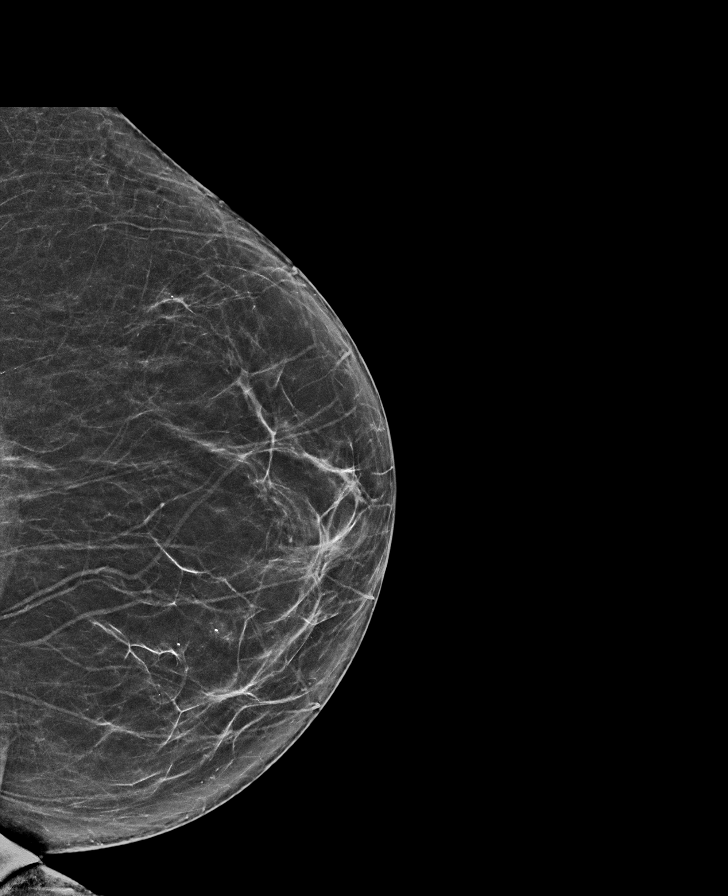

[R MLO synth-2D (2 of 2)]
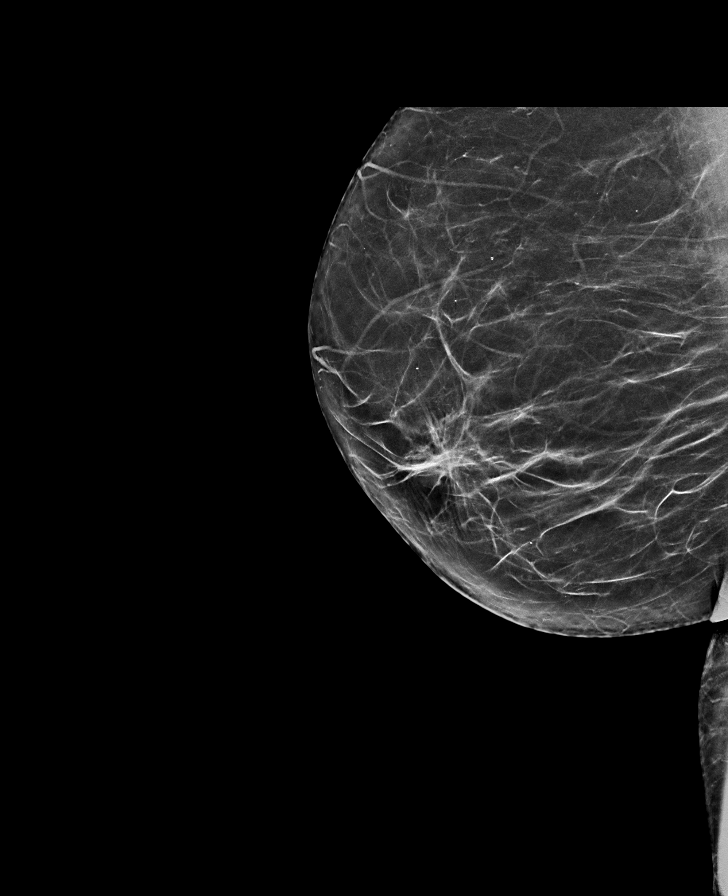

[R CC synth-2D (1 of 2)]
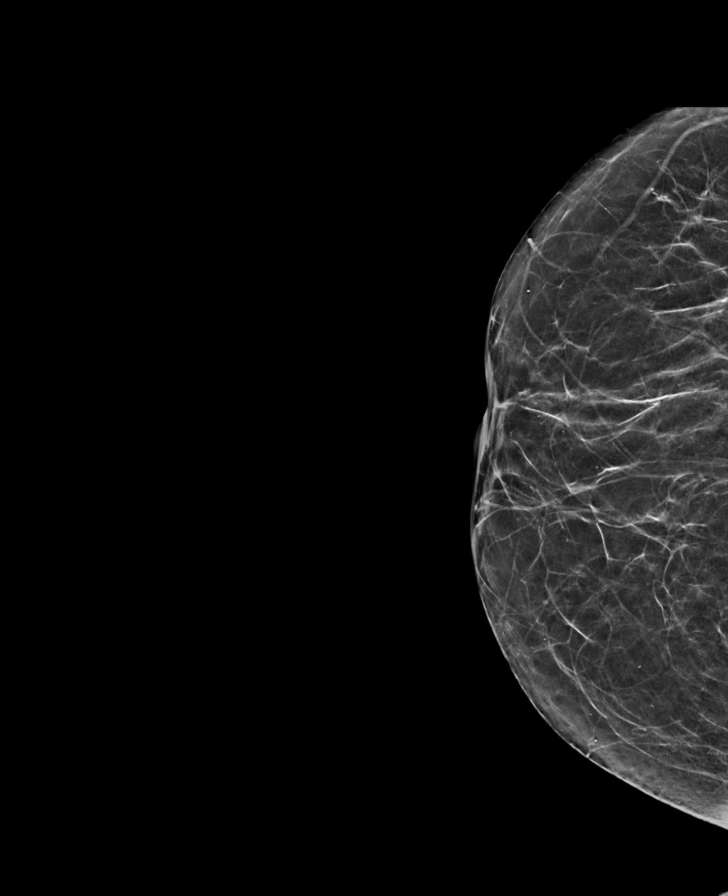

[R CC synth-2D (2 of 2)]
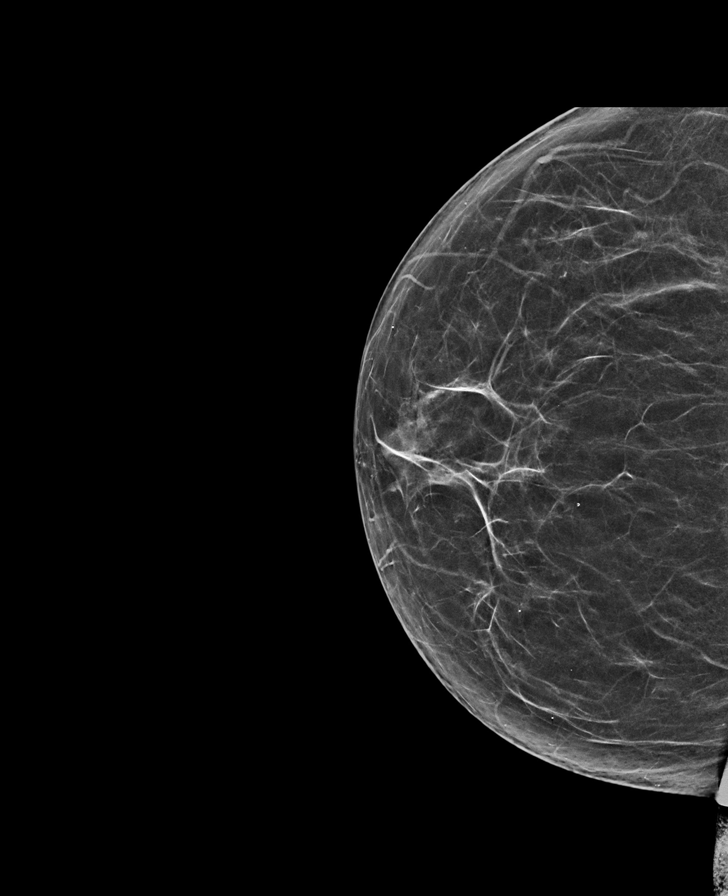

[R MLO tomo · tomo slice 34/67.0]
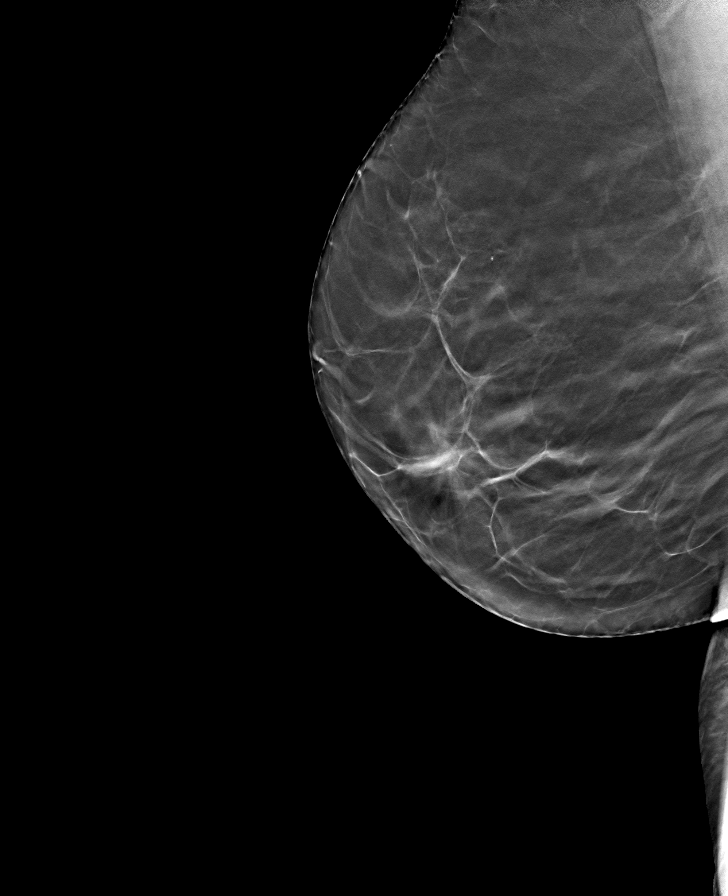

[8 of 40 positions shown; findings below may reference images not displayed]

ACR Breast Density Category b: There are scattered areas of
fibroglandular density.
FINDINGS: There are no findings suspicious for malignancy.
IMPRESSION: No mammographic evidence of malignancy. A result letter of this
screening mammogram will be mailed directly to the patient.

RECOMMENDATION:
Screening mammogram in one year. (Code:51-O-LD2)

BI-RADS CATEGORY  1: Negative.

## 2023-09-03 ENCOUNTER — Other Ambulatory Visit: Payer: Self-pay | Admitting: Family Medicine

## 2023-09-12 ENCOUNTER — Other Ambulatory Visit: Payer: Self-pay | Admitting: Family Medicine

## 2023-09-22 ENCOUNTER — Other Ambulatory Visit: Payer: Self-pay | Admitting: Family Medicine

## 2023-09-28 DIAGNOSIS — D631 Anemia in chronic kidney disease: Secondary | ICD-10-CM | POA: Diagnosis not present

## 2023-09-28 DIAGNOSIS — R809 Proteinuria, unspecified: Secondary | ICD-10-CM | POA: Diagnosis not present

## 2023-09-28 DIAGNOSIS — N189 Chronic kidney disease, unspecified: Secondary | ICD-10-CM | POA: Diagnosis not present

## 2023-09-28 DIAGNOSIS — E211 Secondary hyperparathyroidism, not elsewhere classified: Secondary | ICD-10-CM | POA: Diagnosis not present

## 2023-10-05 DIAGNOSIS — D472 Monoclonal gammopathy: Secondary | ICD-10-CM | POA: Diagnosis not present

## 2023-10-05 DIAGNOSIS — N1832 Chronic kidney disease, stage 3b: Secondary | ICD-10-CM | POA: Diagnosis not present

## 2023-10-05 DIAGNOSIS — I5032 Chronic diastolic (congestive) heart failure: Secondary | ICD-10-CM | POA: Diagnosis not present

## 2023-10-05 DIAGNOSIS — I129 Hypertensive chronic kidney disease with stage 1 through stage 4 chronic kidney disease, or unspecified chronic kidney disease: Secondary | ICD-10-CM | POA: Diagnosis not present

## 2023-10-19 ENCOUNTER — Other Ambulatory Visit: Payer: Self-pay | Admitting: Family Medicine

## 2023-10-30 ENCOUNTER — Other Ambulatory Visit: Payer: Self-pay | Admitting: Family Medicine

## 2023-10-31 ENCOUNTER — Ambulatory Visit: Payer: PPO | Admitting: Family Medicine

## 2023-11-21 ENCOUNTER — Other Ambulatory Visit: Payer: Self-pay | Admitting: Family Medicine

## 2023-12-08 ENCOUNTER — Other Ambulatory Visit: Payer: Self-pay | Admitting: Family Medicine

## 2023-12-12 ENCOUNTER — Other Ambulatory Visit: Payer: Self-pay

## 2023-12-12 DIAGNOSIS — E785 Hyperlipidemia, unspecified: Secondary | ICD-10-CM

## 2023-12-12 DIAGNOSIS — I10 Essential (primary) hypertension: Secondary | ICD-10-CM

## 2023-12-12 DIAGNOSIS — R7303 Prediabetes: Secondary | ICD-10-CM

## 2023-12-14 ENCOUNTER — Other Ambulatory Visit: Payer: Self-pay | Admitting: Family Medicine

## 2023-12-14 DIAGNOSIS — E785 Hyperlipidemia, unspecified: Secondary | ICD-10-CM | POA: Diagnosis not present

## 2023-12-14 DIAGNOSIS — R7303 Prediabetes: Secondary | ICD-10-CM | POA: Diagnosis not present

## 2023-12-14 DIAGNOSIS — I10 Essential (primary) hypertension: Secondary | ICD-10-CM | POA: Diagnosis not present

## 2023-12-15 LAB — LIPID PANEL
Chol/HDL Ratio: 3.9 ratio (ref 0.0–4.4)
Cholesterol, Total: 152 mg/dL (ref 100–199)
HDL: 39 mg/dL — ABNORMAL LOW (ref >39)
LDL Chol Calc (NIH): 82 mg/dL (ref 0–99)
Triglycerides: 181 mg/dL — ABNORMAL HIGH (ref 0–149)
VLDL Cholesterol Cal: 31 mg/dL (ref 5–40)

## 2023-12-15 LAB — CMP14+EGFR
ALT: 16 IU/L (ref 0–32)
AST: 14 IU/L (ref 0–40)
Albumin: 4.9 g/dL — ABNORMAL HIGH (ref 3.8–4.8)
Alkaline Phosphatase: 61 IU/L (ref 44–121)
BUN/Creatinine Ratio: 11 — ABNORMAL LOW (ref 12–28)
BUN: 16 mg/dL (ref 8–27)
Bilirubin Total: 0.3 mg/dL (ref 0.0–1.2)
CO2: 20 mmol/L (ref 20–29)
Calcium: 10.2 mg/dL (ref 8.7–10.3)
Chloride: 99 mmol/L (ref 96–106)
Creatinine, Ser: 1.5 mg/dL — ABNORMAL HIGH (ref 0.57–1.00)
Globulin, Total: 2.3 g/dL (ref 1.5–4.5)
Glucose: 107 mg/dL — ABNORMAL HIGH (ref 70–99)
Potassium: 4.6 mmol/L (ref 3.5–5.2)
Sodium: 135 mmol/L (ref 134–144)
Total Protein: 7.2 g/dL (ref 6.0–8.5)
eGFR: 36 mL/min/{1.73_m2} — ABNORMAL LOW (ref >59)

## 2023-12-15 LAB — HEMOGLOBIN A1C
Est. average glucose Bld gHb Est-mCnc: 120 mg/dL
Hgb A1c MFr Bld: 5.8 % — ABNORMAL HIGH (ref 4.8–5.6)

## 2023-12-16 ENCOUNTER — Other Ambulatory Visit: Payer: Self-pay | Admitting: Family Medicine

## 2023-12-16 DIAGNOSIS — K219 Gastro-esophageal reflux disease without esophagitis: Secondary | ICD-10-CM

## 2023-12-18 ENCOUNTER — Other Ambulatory Visit: Payer: Self-pay | Admitting: Family Medicine

## 2023-12-21 ENCOUNTER — Other Ambulatory Visit: Payer: Self-pay | Admitting: Family Medicine

## 2024-01-02 ENCOUNTER — Other Ambulatory Visit (HOSPITAL_COMMUNITY): Payer: Self-pay | Admitting: Family Medicine

## 2024-01-02 ENCOUNTER — Encounter: Payer: Self-pay | Admitting: Family Medicine

## 2024-01-02 ENCOUNTER — Ambulatory Visit (INDEPENDENT_AMBULATORY_CARE_PROVIDER_SITE_OTHER): Payer: Self-pay | Admitting: Family Medicine

## 2024-01-02 VITALS — BP 153/83 | HR 74 | Resp 18 | Ht 66.0 in | Wt 204.1 lb

## 2024-01-02 DIAGNOSIS — J302 Other seasonal allergic rhinitis: Secondary | ICD-10-CM | POA: Diagnosis not present

## 2024-01-02 DIAGNOSIS — E785 Hyperlipidemia, unspecified: Secondary | ICD-10-CM

## 2024-01-02 DIAGNOSIS — I10 Essential (primary) hypertension: Secondary | ICD-10-CM

## 2024-01-02 DIAGNOSIS — Z1231 Encounter for screening mammogram for malignant neoplasm of breast: Secondary | ICD-10-CM

## 2024-01-02 DIAGNOSIS — R7303 Prediabetes: Secondary | ICD-10-CM

## 2024-01-02 MED ORDER — EZETIMIBE 10 MG PO TABS: 10.0000 mg | ORAL_TABLET | Freq: Every day | ORAL | 3 refills | Status: DC

## 2024-01-02 MED ORDER — ROSUVASTATIN CALCIUM 20 MG PO TABS
20.0000 mg | ORAL_TABLET | Freq: Every day | ORAL | 1 refills | Status: DC
Start: 2024-01-02 — End: 2024-04-18

## 2024-01-02 MED ORDER — AZELASTINE HCL 0.1 % NA SOLN: 2.0000 | Freq: Two times a day (BID) | NASAL | 12 refills | Status: DC

## 2024-01-02 MED ORDER — CETIRIZINE HCL 10 MG PO TABS: 10.0000 mg | ORAL_TABLET | Freq: Two times a day (BID) | ORAL | 11 refills | Status: AC

## 2024-01-02 MED ORDER — AMLODIPINE BESYLATE 10 MG PO TABS: 10.0000 mg | ORAL_TABLET | Freq: Every day | ORAL | 2 refills | Status: DC

## 2024-01-02 NOTE — Patient Instructions (Addendum)
 F/U in 6 weeks re evaluate uncontrolled hypertension and allergies, call if you need me sooner  Please cancel 5/29 appt , this is rescheduled  New for blood pressure is amlodipine  10 mg one daily  New for allergies is certrizine one twice daily,  and astellin if affordable, continue flonase  and montelukast   New for cholesterol is crestor 20 mg and ezetemide 10 mg daily, stop fenofibrate  and lovastatin   Pls schedule mammogram at checkout  Take one vit D3 , 2000 units once daily  Thanks for choosing [facility], we consider it a privelige to serve you.

## 2024-01-02 NOTE — Assessment & Plan Note (Signed)
 Uncontrolled, add zyrtec  twice daily and astellin , contuinue daily flonase  and singulair , will refer to allergist if symptoms remain uncontrolled

## 2024-01-02 NOTE — Assessment & Plan Note (Signed)
 Hyperlipidemia:Low fat diet discussed and encouraged.   Lipid Panel  Lab Results  Component Value Date   CHOL 152 12/14/2023   HDL 39 (L) 12/14/2023   LDLCALC 82 12/14/2023   LDLDIRECT 69 02/13/2009   TRIG 181 (H) 12/14/2023   CHOLHDL 3.9 12/14/2023     High tg , stop fenofibrate  start zetia  and stop lovastatin  startt crestor

## 2024-01-02 NOTE — Progress Notes (Signed)
 Sarah Coleman     MRN: [id number]      DOB: Dec 16, 1950  Chief Complaint  Patient presents with   Hypertension    5 month follow up    Cough    Complains of cough and congestion since yesterday. Taking otc but getting worse     HPI Sarah Coleman is here for follow up and re-evaluation of chronic medical conditions, medication management and review of any available recent lab and radiology data.  Preventive health is updated, specifically  Cancer screening and Immunization.   Questions or concerns regarding consultations or procedures which the PT has had in the interim are  addressed. The PT denies any adverse reactions to current medications since the last visit.  1 week h/o increased and uncontrolled allergy symptoms states unable to afford astelling  No fever or chills Recent labs reviewed and change in  lipid meds needs Blood pressure still uncontrolled ROS Denies recent fever or chills.  Denies chest pains, palpitations and leg swelling Denies abdominal pain, nausea, vomiting,diarrhea or constipation.   Denies dysuria, frequency, hesitancy or incontinence. Denies joint pain, swelling and limitation in mobility. Denies headaches, seizures, numbness, or tingling. Denies depression, anxiety or insomnia. Denies skin break down or rash.   PE  BP (!) 153/83   Pulse 74   Resp 18   Ht 5\' 6"  (1.676 m)   Wt 204 lb 1.3 oz (92.6 kg)   SpO2 94%   BMI 32.94 kg/m   Patient alert and oriented and in no cardiopulmonary distress.  HEENT: No facial asymmetry, EOMI,     Neck supple .Nasal congestion, no sinus tenderness no xcervicsl adenoparthy  Chest: Clear to auscultation bilaterally.  CVS: S1, S2 no murmurs, no S3.Regular rate.  ABD: Soft non tender.   Ext: No edema  MS: Adequate ROM spine, shoulders, hips and knees.  Skin: Intact, no ulcerations or rash noted.  Psych: Good eye contact, normal affect. Memory intact not anxious or depressed appearing.  CNS: CN 2-12  intact, power,  normal throughout.no focal deficits noted.   Assessment & Plan  Essential hypertension Uncontrolleed inc amlodipine  to 10 mg daily re eval in 6 weeks DASH diet and commitment to daily physical activity for a minimum of 30 minutes discussed and encouraged, as a part of hypertension management. The importance of attaining a healthy weight is also discussed.     01/02/2024    1:23 PM 08/29/2023    2:06 PM 05/30/2023   10:12 AM 04/18/2023    1:04 PM 01/31/2023   10:07 AM 01/27/2023    1:05 PM 11/25/2022   11:07 AM  BP/Weight  Systolic BP 153 133 124 -- 141 129 122  Diastolic BP 83 75 82 -- 87 83 80  Wt. (Lbs) 204.08  203.12 204 202.82 204.08 202  BMI 32.94 kg/m2  32.78 kg/m2 32.93 kg/m2 32.74 kg/m2 32.94 kg/m2 32.6 kg/m2       Hyperlipidemia LDL goal <100 Hyperlipidemia:Low fat diet discussed and encouraged.   Lipid Panel  Lab Results  Component Value Date   CHOL 152 12/14/2023   HDL 39 (L) 12/14/2023   LDLCALC 82 12/14/2023   LDLDIRECT 69 02/13/2009   TRIG 181 (H) 12/14/2023   CHOLHDL 3.9 12/14/2023     High tg , stop fenofibrate  start zetia  and stop lovastatin  startt crestor  Seasonal allergies Uncontrolled, add zyrtec  twice daily and astellin , contuinue daily flonase  and singulair , will refer to allergist if symptoms remain uncontrolled  Prediabetes Patient educated  about the importance of limiting  Carbohydrate intake , the need to commit to daily physical activity for a minimum of 30 minutes , and to commit weight loss. The fact that changes in all these areas will reduce or eliminate all together the development of diabetes is stressed.  Deteriorated slightly , needs to lower sweets and starchy white foods      Latest Ref Rng & Units 12/14/2023    8:39 AM 08/01/2023   10:06 AM 05/31/2023    8:03 AM 01/24/2023   10:17 AM 11/25/2022    9:39 AM  Diabetic Labs  HbA1c 4.8 - 5.6 % 5.8   5.7   6.4   Chol 100 - 199 mg/dL 696   295   284   HDL >13  mg/dL 39   37   42   Calc LDL 0 - 99 mg/dL 82   90   92   Triglycerides 0 - 149 mg/dL 244   010   272   Creatinine 0.57 - 1.00 mg/dL 5.36  6.44  0.34  7.42  1.47       01/02/2024    1:23 PM 08/29/2023    2:06 PM 05/30/2023   10:12 AM 04/18/2023    1:04 PM 01/31/2023   10:07 AM 01/27/2023    1:05 PM 11/25/2022   11:07 AM  BP/Weight  Systolic BP 153 133 124 -- 141 129 122  Diastolic BP 83 75 82 -- 87 83 80  Wt. (Lbs) 204.08  203.12 204 202.82 204.08 202  BMI 32.94 kg/m2  32.78 kg/m2 32.93 kg/m2 32.74 kg/m2 32.94 kg/m2 32.6 kg/m2       No data to display            Morbid obesity (HCC)  Patient re-educated about  the importance of commitment to a  minimum of 150 minutes of exercise per week as able.  The importance of healthy food choices with portion control discussed, as well as eating regularly and within a 12 hour window most days. The need to choose "clean , green" food 50 to 75% of the time is discussed, as well as to make water  the primary drink and set a goal of 64 ounces water  daily.       01/02/2024    1:23 PM 05/30/2023   10:12 AM 04/18/2023    1:04 PM  Weight /BMI  Weight 204 lb 1.3 oz 203 lb 1.9 oz 204 lb  Height 5\' 6"  (1.676 m) 5\' 6"  (1.676 m) 5\' 6"  (1.676 m)  BMI 32.94 kg/m2 32.78 kg/m2 32.93 kg/m2    unchanged

## 2024-01-02 NOTE — Assessment & Plan Note (Addendum)
 Uncontrolleed inc amlodipine  to 10 mg daily re eval in 6 weeks DASH diet and commitment to daily physical activity for a minimum of 30 minutes discussed and encouraged, as a part of hypertension management. The importance of attaining a healthy weight is also discussed.     01/02/2024    1:23 PM 08/29/2023    2:06 PM 05/30/2023   10:12 AM 04/18/2023    1:04 PM 01/31/2023   10:07 AM 01/27/2023    1:05 PM 11/25/2022   11:07 AM  BP/Weight  Systolic BP 153 133 124 -- 141 129 122  Diastolic BP 83 75 82 -- 87 83 80  Wt. (Lbs) 204.08  203.12 204 202.82 204.08 202  BMI 32.94 kg/m2  32.78 kg/m2 32.93 kg/m2 32.74 kg/m2 32.94 kg/m2 32.6 kg/m2

## 2024-01-02 NOTE — Assessment & Plan Note (Signed)
  Patient re-educated about  the importance of commitment to a  minimum of 150 minutes of exercise per week as able.  The importance of healthy food choices with portion control discussed, as well as eating regularly and within a 12 hour window most days. The need to choose "clean , green" food 50 to 75% of the time is discussed, as well as to make water  the primary drink and set a goal of 64 ounces water  daily.       01/02/2024    1:23 PM 05/30/2023   10:12 AM 04/18/2023    1:04 PM  Weight /BMI  Weight 204 lb 1.3 oz 203 lb 1.9 oz 204 lb  Height 5\' 6"  (1.676 m) 5\' 6"  (1.676 m) 5\' 6"  (1.676 m)  BMI 32.94 kg/m2 32.78 kg/m2 32.93 kg/m2    unchanged

## 2024-01-02 NOTE — Assessment & Plan Note (Addendum)
 Patient educated about the importance of limiting  Carbohydrate intake , the need to commit to daily physical activity for a minimum of 30 minutes , and to commit weight loss. The fact that changes in all these areas will reduce or eliminate all together the development of diabetes is stressed.  Deteriorated slightly , needs to lower sweets and starchy white foods      Latest Ref Rng & Units 12/14/2023    8:39 AM 08/01/2023   10:06 AM 05/31/2023    8:03 AM 01/24/2023   10:17 AM 11/25/2022    9:39 AM  Diabetic Labs  HbA1c 4.8 - 5.6 % 5.8   5.7   6.4   Chol 100 - 199 mg/dL 086   578   469   HDL >62 mg/dL 39   37   42   Calc LDL 0 - 99 mg/dL 82   90   92   Triglycerides 0 - 149 mg/dL 952   841   324   Creatinine 0.57 - 1.00 mg/dL 4.01  0.27  2.53  6.64  1.47       01/02/2024    1:23 PM 08/29/2023    2:06 PM 05/30/2023   10:12 AM 04/18/2023    1:04 PM 01/31/2023   10:07 AM 01/27/2023    1:05 PM 11/25/2022   11:07 AM  BP/Weight  Systolic BP 153 133 124 -- 141 129 122  Diastolic BP 83 75 82 -- 87 83 80  Wt. (Lbs) 204.08  203.12 204 202.82 204.08 202  BMI 32.94 kg/m2  32.78 kg/m2 32.93 kg/m2 32.74 kg/m2 32.94 kg/m2 32.6 kg/m2       No data to display

## 2024-01-11 ENCOUNTER — Ambulatory Visit: Payer: Self-pay | Admitting: Family Medicine

## 2024-01-15 ENCOUNTER — Other Ambulatory Visit: Payer: Self-pay | Admitting: Family Medicine

## 2024-01-24 ENCOUNTER — Other Ambulatory Visit: Payer: Self-pay | Admitting: Family Medicine

## 2024-01-29 ENCOUNTER — Other Ambulatory Visit: Payer: Self-pay

## 2024-01-29 DIAGNOSIS — D472 Monoclonal gammopathy: Secondary | ICD-10-CM

## 2024-01-29 DIAGNOSIS — E871 Hypo-osmolality and hyponatremia: Secondary | ICD-10-CM

## 2024-01-29 DIAGNOSIS — D75839 Thrombocytosis, unspecified: Secondary | ICD-10-CM

## 2024-01-29 DIAGNOSIS — R778 Other specified abnormalities of plasma proteins: Secondary | ICD-10-CM

## 2024-01-30 ENCOUNTER — Inpatient Hospital Stay: Payer: PPO | Attending: Hematology

## 2024-01-30 DIAGNOSIS — N1832 Chronic kidney disease, stage 3b: Secondary | ICD-10-CM | POA: Insufficient documentation

## 2024-01-30 DIAGNOSIS — Z87891 Personal history of nicotine dependence: Secondary | ICD-10-CM | POA: Diagnosis not present

## 2024-01-30 DIAGNOSIS — E871 Hypo-osmolality and hyponatremia: Secondary | ICD-10-CM

## 2024-01-30 DIAGNOSIS — Z806 Family history of leukemia: Secondary | ICD-10-CM | POA: Insufficient documentation

## 2024-01-30 DIAGNOSIS — Z8049 Family history of malignant neoplasm of other genital organs: Secondary | ICD-10-CM | POA: Insufficient documentation

## 2024-01-30 DIAGNOSIS — D472 Monoclonal gammopathy: Secondary | ICD-10-CM | POA: Diagnosis not present

## 2024-01-30 DIAGNOSIS — D75839 Thrombocytosis, unspecified: Secondary | ICD-10-CM

## 2024-01-30 DIAGNOSIS — Z800 Family history of malignant neoplasm of digestive organs: Secondary | ICD-10-CM | POA: Insufficient documentation

## 2024-01-30 DIAGNOSIS — R778 Other specified abnormalities of plasma proteins: Secondary | ICD-10-CM

## 2024-01-30 LAB — CBC WITH DIFFERENTIAL/PLATELET
Abs Immature Granulocytes: 0.01 10*3/uL (ref 0.00–0.07)
Basophils Absolute: 0 10*3/uL (ref 0.0–0.1)
Basophils Relative: 0 %
Eosinophils Absolute: 0 10*3/uL (ref 0.0–0.5)
Eosinophils Relative: 1 %
HCT: 39.5 % (ref 36.0–46.0)
Hemoglobin: 13.2 g/dL (ref 12.0–15.0)
Immature Granulocytes: 0 %
Lymphocytes Relative: 45 %
Lymphs Abs: 3.1 10*3/uL (ref 0.7–4.0)
MCH: 31.8 pg (ref 26.0–34.0)
MCHC: 33.4 g/dL (ref 30.0–36.0)
MCV: 95.2 fL (ref 80.0–100.0)
Monocytes Absolute: 0.5 10*3/uL (ref 0.1–1.0)
Monocytes Relative: 7 %
Neutro Abs: 3.3 10*3/uL (ref 1.7–7.7)
Neutrophils Relative %: 47 %
Platelets: 311 10*3/uL (ref 150–400)
RBC: 4.15 MIL/uL (ref 3.87–5.11)
RDW: 13.2 % (ref 11.5–15.5)
WBC: 6.9 10*3/uL (ref 4.0–10.5)
nRBC: 0 % (ref 0.0–0.2)

## 2024-01-30 LAB — COMPREHENSIVE METABOLIC PANEL WITH GFR
ALT: 21 U/L (ref 0–44)
AST: 21 U/L (ref 15–41)
Albumin: 4.6 g/dL (ref 3.5–5.0)
Alkaline Phosphatase: 45 U/L (ref 38–126)
Anion gap: 11 (ref 5–15)
BUN: 15 mg/dL (ref 8–23)
CO2: 24 mmol/L (ref 22–32)
Calcium: 10.1 mg/dL (ref 8.9–10.3)
Chloride: 102 mmol/L (ref 98–111)
Creatinine, Ser: 1.25 mg/dL — ABNORMAL HIGH (ref 0.44–1.00)
GFR, Estimated: 45 mL/min — ABNORMAL LOW (ref >60)
Glucose, Bld: 102 mg/dL — ABNORMAL HIGH (ref 70–99)
Potassium: 4.1 mmol/L (ref 3.5–5.1)
Sodium: 137 mmol/L (ref 135–145)
Total Bilirubin: 0.4 mg/dL (ref 0.0–1.2)
Total Protein: 7.8 g/dL (ref 6.5–8.1)

## 2024-01-30 LAB — LACTATE DEHYDROGENASE: LDH: 120 U/L (ref 98–192)

## 2024-01-31 LAB — KAPPA/LAMBDA LIGHT CHAINS
Kappa free light chain: 16.7 mg/L (ref 3.3–19.4)
Kappa, lambda light chain ratio: 1.4 (ref 0.26–1.65)
Lambda free light chains: 11.9 mg/L (ref 5.7–26.3)

## 2024-02-01 LAB — PROTEIN ELECTROPHORESIS, SERUM
A/G Ratio: 1.4 (ref 0.7–1.7)
Albumin ELP: 4.3 g/dL (ref 2.9–4.4)
Alpha-1-Globulin: 0.3 g/dL (ref 0.0–0.4)
Alpha-2-Globulin: 0.7 g/dL (ref 0.4–1.0)
Beta Globulin: 1.1 g/dL (ref 0.7–1.3)
Gamma Globulin: 0.9 g/dL (ref 0.4–1.8)
Globulin, Total: 3.1 g/dL (ref 2.2–3.9)
M-Spike, %: 0.3 g/dL — ABNORMAL HIGH
Total Protein ELP: 7.4 g/dL (ref 6.0–8.5)

## 2024-02-05 NOTE — Progress Notes (Unsigned)
 [facility] [address], [place] [address]   CLINIC:  Medical Oncology/Hematology  PCP:  Antonetta Rollene BRAVO, MD [address] [place] [address] [phone]   REASON FOR VISIT:  Follow-up for IgG kappa MGUS  PRIOR THERAPY: None  CURRENT THERAPY: Surveillance  INTERVAL HISTORY:   Ms. Duan 73 y.o. female returns for routine follow-up of her IgG kappa MGUS.  She was last seen by NP Delon Hope on 08/08/2023.  At today's visit, she reports feeling fairly well.  No recent hospitalizations, surgeries, or changes in baseline health status.  She has some intermittent numbers of the fingers and toes along particular nerve distributions - these improved after she started taking magnesium.   She has chronic arthritic pain in her left knee, denies any new bone pain or recent fractures.    Her weight has been stable since her last visit.   She reports occasional hot flashes, but denies any fevers, chills, or night sweats.   No new neurologic symptoms such as tinnitus, new-onset hearing loss, blurred vision, headache, or dizziness.    No thromboembolic events since her last visit.   No new masses or lymphadenopathy per her report.  She has 25% energy and 100% appetite.  She endorses that she is maintaining a stable weight.  ASSESSMENT & PLAN:  1.  IgG kappa MGUS - Work-up for CKD by Dr. Rachele showed serum immunofixation with faint IgG kappa - Hematology work-up (04/11/2022): SPEP with M spike 0.4 Immunofixation confirms IgG kappa Mildly elevated kappa free light chains 22.9 with normal lambda free light chain 15.1 and normal ratio 1.52. Normal LDH and beta-2  microglobulin - 24-hour urine (04/27/2022): Normal urine immunofixation and urine protein levels.  No evidence of monoclonal protein in urine. - Skeletal survey (04/11/2022) with multiple questionable small/faint lucencies throughout the axial and appendicular skeleton, described in more  detail in radiology report - Repeat skeletal survey (07/26/2022): Stable multifocal lytic lesions (5 mm lucency of parietal bone, 7 mm lucency of right scapula, lucencies within the right hemipelvis are not well-visualized, but appears stable measuring up to 7 mm); no new lesions identified - Most recent skeletal survey (01/31/2023): Subtle areas of lucency in bones are similar to previous.  Areas of lucency are indeterminate.  No suspicious lucent bone lesions. - Most recent labs (01/30/2024): M spike stable at 0.3. Normal FLC ratio and normal kappa/lambda light chains. Normal LDH. CMP with creatinine 1.25 (baseline CKD stage IIIa/b). Calcium  10.1 (stopped taking calcium  supplements in December 2023)  Normal Hgb 13.2 - No new bone pain or B symptoms.   Intermittent numbness/tingling of her left foot second through fifth toes and left hand fourth and fifth fingers.  (She is nondiabetic). - PLAN: She is due for annual skeletal survey. - MGUS/myeloma labs and RTC in 6 months.   2.  Thrombocytosis and lymphocytosis, mild -- RESOLVED - CBC from 04/11/2022 shows mildly elevated platelets 422 and mild lymphocytosis 4.4 - Most recent CBC is normal - PLAN: We will continue active surveillance and consider additional workup if necessary   3.  Social/family history: - She lives at home with her husband.  She retired from working at [employer].  Non-smoker.  Uses lots of rate/backstop sprays.  No other chemical exposure. - Maternal aunt had leukemia.  Father had colon cancer.  Sister had uterine cancer.    PLAN SUMMARY: >> Skeletal survey within the next 2 weeks >> Labs in 6 months = CBC/D,  CMP, LDH, SPEP, free light chains >> OFFICE visit in 6 months (1 week after labs)     REVIEW OF SYSTEMS:  Review of Systems  Constitutional:  Positive for fatigue. Negative for appetite change, chills, diaphoresis, fever and unexpected weight change.  HENT:   Negative for lump/mass and nosebleeds.    Eyes:  Negative for eye problems.  Respiratory:  Negative for cough, hemoptysis and shortness of breath.   Cardiovascular:  Negative for chest pain, leg swelling and palpitations.  Gastrointestinal:  Positive for constipation and vomiting. Negative for abdominal pain, blood in stool, diarrhea and nausea.  Genitourinary:  Negative for hematuria.   Musculoskeletal:  Positive for arthralgias.  Skin: Negative.   Neurological:  Positive for dizziness and headaches. Negative for light-headedness.  Hematological:  Does not bruise/bleed easily.     PHYSICAL EXAM:  ECOG PERFORMANCE STATUS: 1 - Symptomatic but completely ambulatory  Vitals:   02/06/24 1014  BP: (!) 143/80  Pulse: 80  Resp: 18  Temp: 99.3 F (37.4 C)  SpO2: 95%   Filed Weights   02/06/24 1014  Weight: 204 lb 2.3 oz (92.6 kg)   Physical Exam Constitutional:      Appearance: Normal appearance. She is obese.   Cardiovascular:     Heart sounds: Normal heart sounds.  Pulmonary:     Breath sounds: Normal breath sounds.   Neurological:     General: No focal deficit present.     Mental Status: Mental status is at baseline.   Psychiatric:        Behavior: Behavior normal. Behavior is cooperative.     PAST MEDICAL/SURGICAL HISTORY:  Past Medical History:  Diagnosis Date   Allergic rhinitis    Allergy    Anemia    Arthritis    GERD (gastroesophageal reflux disease) 2013   Hyperlipidemia    Hypertension    Metabolic syndrome X 2000   Morbid obesity (HCC) 2000   Obesity    Prediabetes 2010   Renal insufficiency    Seizures (HCC)    as child; unknown etiology.   Vertigo    Past Surgical History:  Procedure Laterality Date   BIOPSY  04/11/2017   Procedure: BIOPSY;  Surgeon: Harvey Margo CROME, MD;  [facility];  Service: Endoscopy;;  gastric bx's   COLONOSCOPY N/A 10/07/2016   Procedure: COLONOSCOPY;  Surgeon: Margo CROME Harvey, MD;  [facility]: [facility];  Service: Endoscopy;  Laterality: N/A;   9:30 AM   COLONOSCOPY WITH PROPOFOL  N/A 11/01/2016   Procedure: COLONOSCOPY WITH PROPOFOL ;  Surgeon: Margo CROME Harvey, MD;  Location: [facility];  Service: Endoscopy;  Laterality: N/A;  830    COLONOSCOPY WITH PROPOFOL  N/A 01/23/2018   Procedure: COLONOSCOPY WITH PROPOFOL ;  Surgeon: Harvey Margo CROME, MD;  [facility]: [facility];  Service: Endoscopy;  Laterality: N/A;  2:00pm - pt knows to arrive at 10:15   COLONOSCOPY WITH PROPOFOL  N/A 09/27/2021   Procedure: COLONOSCOPY WITH PROPOFOL ;  Surgeon: Cindie Carlin POUR, DO;  [facility]: [facility];  Service: Endoscopy;  Laterality: N/A;  8:30 / ASA 3   ESOPHAGOGASTRODUODENOSCOPY (EGD) WITH PROPOFOL  N/A 04/11/2017   Procedure: ESOPHAGOGASTRODUODENOSCOPY (EGD) WITH PROPOFOL ;  Surgeon: Harvey Margo CROME, MD;  [facility]: [facility];  Service: Endoscopy;  Laterality: N/A;  10:45am   GERD     PARTIAL HYSTERECTOMY  1987   secondary to ovarian cyst    POLYPECTOMY  11/01/2016   Procedure: POLYPECTOMY;  Surgeon: Margo CROME Harvey, MD;  [facility];  Service: Endoscopy;;  colon   POLYPECTOMY  01/23/2018   Procedure: POLYPECTOMY;  Surgeon: Harvey Margo CROME, MD;  [facility]: [facility];  Service: Endoscopy;;  ascending colon   SAVORY DILATION N/A 04/11/2017   Procedure: SAVORY DILATION;  Surgeon: Harvey Margo CROME, MD;  [facility]: [facility];  Service: Endoscopy;  Laterality: N/A;   TUBAL LIGATION  1975    SOCIAL HISTORY:  Social History   Socioeconomic History   Marital status: Married    Spouse name: Not on file   Number of children: 5   Years of education: Not on file   Highest education level: Not on file  Occupational History   Occupation: retired   Tobacco Use   Smoking status: Former    Current packs/day: 0.00    Average packs/day: 1 pack/day for 3.0 years (3.0 ttl pk-yrs)    Types: Cigarettes    Start date: 08/16/1971    Quit date: 08/15/1974    Years since quitting: 49.5   Smokeless tobacco: Never  Vaping Use   Vaping status: Never  Used  Substance and Sexual Activity   Alcohol  use: No    Alcohol /week: 0.0 standard drinks of alcohol    Drug use: No   Sexual activity: Not Currently    Birth control/protection: Surgical    Comment: hyst  Other Topics Concern   Not on file  Social History Narrative   Not on file   Social Drivers of Health   Financial Resource Strain: Low Risk  (04/18/2023)   Overall Financial Resource Strain (CARDIA)    Difficulty of Paying Living Expenses: Not hard at all  Food Insecurity: No Food Insecurity (04/18/2023)   Hunger Vital Sign    Worried About Running Out of Food in the Last Year: Never true    Ran Out of Food in the Last Year: Never true  Transportation Needs: No Transportation Needs (04/18/2023)   PRAPARE - Administrator, Civil Service (Medical): No    Lack of Transportation (Non-Medical): No  Physical Activity: Insufficiently Active (04/18/2023)   Exercise Vital Sign    Days of Exercise per Week: 3 days    Minutes of Exercise per Session: 30 min  Stress: No Stress Concern Present (04/18/2023)   [employer] of Occupational Health - Occupational Stress Questionnaire    Feeling of Stress : Not at all  Social Connections: Socially Integrated (04/18/2023)   Social Connection and Isolation Panel    Frequency of Communication with Friends and Family: More than three times a week    Frequency of Social Gatherings with Friends and Family: More than three times a week    Attends Religious Services: More than 4 times per year    Active Member of [employer] or Organizations: Yes    Attends Engineer, structural: More than 4 times per year    Marital Status: Married  Catering manager Violence: Not At Risk (04/18/2023)   Humiliation, Afraid, Rape, and Kick questionnaire    Fear of Current or Ex-Partner: No    Emotionally Abused: No    Physically Abused: No    Sexually Abused: No    FAMILY HISTORY:  Family History  Problem Relation Age of Onset   Heart attack Mother     Heart disease Mother 22       massive heart attack   Prostate cancer Father    Colon cancer Father 79   Hypertension Sister    Ovarian cancer Sister  Cancer Brother 53       oral    Hypertension Brother    Heart disease Brother     CURRENT MEDICATIONS:  Outpatient Encounter Medications as of 02/06/2024  Medication Sig   acetaminophen  (TYLENOL ) 500 MG tablet Take 1,000 mg by mouth every 8 (eight) hours as needed for moderate pain or headache.   amLODipine  (NORVASC ) 10 MG tablet Take 1 tablet (10 mg total) by mouth daily.   azelastine  (ASTELIN ) 0.1 % nasal spray Place 2 sprays into both nostrils 2 (two) times daily. Use in each nostril as directed   Calcium  Carbonate-Vit D-Min (CALCIUM  1200 PO) Take 1,200 mg by mouth daily.   cetirizine  (ZYRTEC ) 10 MG tablet Take 1 tablet (10 mg total) by mouth 2 (two) times daily.   chlorthalidone  (HYGROTON ) 25 MG tablet Take 1 tablet (25 mg total) by mouth daily.   cyclobenzaprine  (FLEXERIL ) 5 MG tablet TAKE 1 TABLET BY MOUTH TWICE DAILY AS NEEDED FOR  NECK  SPASMS   estradiol  (ESTRACE ) 0.1 MG/GM vaginal cream USE 1 APPLICATORFUL VAGINALLY EVERY OTHER DAY AS NEEDED FOR  VAGINAL  DISCOMFORT   ezetimibe  (ZETIA ) 10 MG tablet Take 1 tablet (10 mg total) by mouth daily.   Ferrous Sulfate  (IRON) 325 (65 Fe) MG TABS Take 1 tablet by mouth once daily with breakfast   fluticasone  (FLONASE ) 50 MCG/ACT nasal spray Use 2 spray(s) in each nostril once daily   montelukast  (SINGULAIR ) 10 MG tablet TAKE 1 TABLET BY MOUTH AT BEDTIME   pantoprazole  (PROTONIX ) 20 MG tablet Take 1 tablet by mouth once daily   potassium chloride  SA (KLOR-CON  M) 20 MEQ tablet TAKE 2 TABLETS BY MOUTH THREE TIMES DAILY   rosuvastatin  (CRESTOR ) 20 MG tablet Take 1 tablet (20 mg total) by mouth daily.   solifenacin  (VESICARE ) 10 MG tablet Take 1 tablet (10 mg total) by mouth daily.   spironolactone  (ALDACTONE ) 100 MG tablet Take 1 tablet (100 mg total) by mouth daily.   VITAMIN D  PO Take  1,200 Units by mouth daily.   No facility-administered encounter medications on file as of 02/06/2024.    ALLERGIES:  Allergies  Allergen Reactions   Banana Swelling   Cinnamon Swelling   Ibuprofen Swelling   Orange Fruit [Citrus] Swelling    Pt states she found out that she was allergic to oranges around may 2023 has swelling reations.   Other     Orange   Penicillins Nausea And Vomiting    Has patient had a PCN reaction causing immediate rash, facial/tongue/throat swelling, SOB or lightheadedness with hypotension:No Has patient had a PCN reaction causing severe rash involving mucus membranes or skin necrosis:No Has patient had a PCN reaction that required hospitalization:No Has patient had a PCN reaction occurring within the last 10 years:No If all of the above answers are NO, then may proceed with Cephalosporin use.    Sweet Potato Swelling    LABORATORY DATA:  I have reviewed the labs as listed.  CBC    Component Value Date/Time   WBC 6.9 01/30/2024 0947   RBC 4.15 01/30/2024 0947   HGB 13.2 01/30/2024 0947   HGB 12.7 05/31/2023 0803   HCT 39.5 01/30/2024 0947   HCT 39.4 05/31/2023 0803   PLT 311 01/30/2024 0947   PLT 352 05/31/2023 0803   MCV 95.2 01/30/2024 0947   MCV 99 (H) 05/31/2023 0803   MCH 31.8 01/30/2024 0947   MCHC 33.4 01/30/2024 0947   RDW 13.2 01/30/2024 0947   RDW 12.9  05/31/2023 0803   LYMPHSABS 3.1 01/30/2024 0947   LYMPHSABS 3.0 05/31/2023 0803   MONOABS 0.5 01/30/2024 0947   EOSABS 0.0 01/30/2024 0947   EOSABS 0.1 05/31/2023 0803   BASOSABS 0.0 01/30/2024 0947   BASOSABS 0.0 05/31/2023 0803      Latest Ref Rng & Units 01/30/2024    9:47 AM 12/14/2023    8:39 AM 08/01/2023   10:06 AM  CMP  Glucose 70 - 99 mg/dL 897  892  883   BUN 8 - 23 mg/dL 15  16  20    Creatinine 0.44 - 1.00 mg/dL 8.74  8.49  8.48   Sodium 135 - 145 mmol/L 137  135  130   Potassium 3.5 - 5.1 mmol/L 4.1  4.6  4.0   Chloride 98 - 111 mmol/L 102  99  98   CO2 22 -  32 mmol/L 24  20  21    Calcium  8.9 - 10.3 mg/dL 89.8  89.7  89.7   Total Protein 6.5 - 8.1 g/dL 7.8  7.2  8.3   Total Bilirubin 0.0 - 1.2 mg/dL 0.4  0.3  0.4   Alkaline Phos 38 - 126 U/L 45  61  42   AST 15 - 41 U/L 21  14  20    ALT 0 - 44 U/L 21  16  18      DIAGNOSTIC IMAGING:  I have independently reviewed the relevant imaging and discussed with the patient.   WRAP UP:  All questions were answered. The patient knows to call the clinic with any problems, questions or concerns.  Medical decision making: Moderate  Time spent on visit: I spent 20 minutes counseling the patient face to face. The total time spent in the appointment was 30 minutes and more than 50% was on counseling.  Pleasant CHRISTELLA Barefoot, PA-C  02/06/24 11:12 AM

## 2024-02-06 ENCOUNTER — Inpatient Hospital Stay (HOSPITAL_BASED_OUTPATIENT_CLINIC_OR_DEPARTMENT_OTHER): Payer: PPO | Admitting: Physician Assistant

## 2024-02-06 VITALS — BP 143/80 | HR 80 | Temp 99.3°F | Resp 18 | Wt 204.1 lb

## 2024-02-06 DIAGNOSIS — D75839 Thrombocytosis, unspecified: Secondary | ICD-10-CM | POA: Diagnosis not present

## 2024-02-06 DIAGNOSIS — D472 Monoclonal gammopathy: Secondary | ICD-10-CM

## 2024-02-06 DIAGNOSIS — E871 Hypo-osmolality and hyponatremia: Secondary | ICD-10-CM

## 2024-02-06 DIAGNOSIS — R778 Other specified abnormalities of plasma proteins: Secondary | ICD-10-CM

## 2024-02-06 NOTE — Patient Instructions (Signed)
 [facility] at [facility] **VISIT SUMMARY & IMPORTANT INSTRUCTIONS **   You were seen today by Pleasant Barefoot PA-C for your MGUS.    MGUS (Monoclonal Gammopathy of Undetermined Significance) As we discussed, this is an increased amount of abnormal protein in your blood. This is a precancerous condition.  It may never cause problems, or it may eventually turn into a type of cancer known as multiple myeloma. At this time, you do NOT have multiple myeloma, and you do not need any treatment.  We will continue to monitor your labs and x-rays.  >> Please stop at radiology within the next 2 weeks to check your Xrays and make sure you do not have any new bone spots.  MEDICATIONS: No changes to your home medications  FOLLOW-UP APPOINTMENT: Office visit in 6 months (1 week after labs)  ** Thank you for trusting me with your healthcare!  I strive to provide all of my patients with quality care at each visit.  If you receive a survey for this visit, I would be so grateful to you for taking the time to provide feedback.  Thank you in advance!  ~ Malikah Principato                   Dr. Alean Stands   &   Pleasant Barefoot, PA-C   - - - - - - - - - - - - - - - - - -    Thank you for choosing [facility] at [facility] to provide your oncology and hematology care.  To afford each patient quality time with our provider, please arrive at least 15 minutes before your scheduled appointment time.   If you have a lab appointment with the Cancer Center please come in thru the Main Entrance and check in at the main information desk.  You need to re-schedule your appointment should you arrive 10 or more minutes late.  We strive to give you quality time with our providers, and arriving late affects you and other patients whose appointments are after yours.  Also, if you no show three or more times for appointments you may be dismissed from the clinic at the providers  discretion.     Again, thank you for choosing [facility].  Our hope is that these requests will decrease the amount of time that you wait before being seen by our physicians.       _____________________________________________________________  Should you have questions after your visit to [facility], please contact our office at [phone] and follow the prompts.  Our office hours are 8:00 a.m. and 4:30 p.m. Monday - Friday.  Please note that voicemails left after 4:00 p.m. may not be returned until the following business day.  We are closed weekends and major holidays.  You do have access to a nurse 24-7, just call the main number to the clinic [phone] and do not press any options, hold on the line and a nurse will answer the phone.    For prescription refill requests, have your pharmacy contact our office and allow 72 hours.

## 2024-02-08 ENCOUNTER — Ambulatory Visit (HOSPITAL_COMMUNITY)
Admission: RE | Admit: 2024-02-08 | Discharge: 2024-02-08 | Disposition: A | Discharge status: Home / Self Care | Source: Ambulatory Visit | Attending: Physician Assistant | Admitting: Physician Assistant

## 2024-02-08 DIAGNOSIS — D472 Monoclonal gammopathy: Secondary | ICD-10-CM | POA: Diagnosis not present

## 2024-02-08 DIAGNOSIS — M51379 Other intervertebral disc degeneration, lumbosacral region without mention of lumbar back pain or lower extremity pain: Secondary | ICD-10-CM | POA: Diagnosis not present

## 2024-02-13 ENCOUNTER — Encounter: Payer: Self-pay | Admitting: Family Medicine

## 2024-02-13 ENCOUNTER — Ambulatory Visit (INDEPENDENT_AMBULATORY_CARE_PROVIDER_SITE_OTHER): Admitting: Family Medicine

## 2024-02-13 VITALS — BP 152/92 | HR 74 | Resp 16 | Ht 66.0 in | Wt 201.1 lb

## 2024-02-13 DIAGNOSIS — J302 Other seasonal allergic rhinitis: Secondary | ICD-10-CM

## 2024-02-13 DIAGNOSIS — N3946 Mixed incontinence: Secondary | ICD-10-CM | POA: Diagnosis not present

## 2024-02-13 DIAGNOSIS — E785 Hyperlipidemia, unspecified: Secondary | ICD-10-CM | POA: Diagnosis not present

## 2024-02-13 DIAGNOSIS — E559 Vitamin D deficiency, unspecified: Secondary | ICD-10-CM | POA: Diagnosis not present

## 2024-02-13 DIAGNOSIS — D472 Monoclonal gammopathy: Secondary | ICD-10-CM

## 2024-02-13 DIAGNOSIS — R7303 Prediabetes: Secondary | ICD-10-CM | POA: Diagnosis not present

## 2024-02-13 DIAGNOSIS — I10 Essential (primary) hypertension: Secondary | ICD-10-CM | POA: Diagnosis not present

## 2024-02-13 MED ORDER — CHLORTHALIDONE 25 MG PO TABS: 25.0000 mg | ORAL_TABLET | Freq: Every day | ORAL | 2 refills | Status: DC

## 2024-02-13 MED ORDER — CARVEDILOL 3.125 MG PO TABS: 3.1250 mg | ORAL_TABLET | Freq: Two times a day (BID) | ORAL | 5 refills | Status: DC

## 2024-02-13 NOTE — Progress Notes (Signed)
 Sarah Coleman     MRN: [id number]      DOB: 09/08/1950  Chief Complaint  Patient presents with   Hypertension    6 week follow up     HPI [name] is here for follow up and re-evaluation of chronic medical conditions, medication management and review of any available recent lab and radiology data.  Preventive health is updated, specifically  Cancer screening and Immunization.   Questions or concerns regarding consultations or procedures which the PT has had in the interim are  addressed. The PT denies any adverse reactions to current medications since the last visit.  There are no new concerns.  There are no specific complaints   ROS Denies recent fever or chills. Denies sinus pressure, nasal congestion, ear pain or sore throat. Denies chest congestion, productive cough or wheezing. Denies chest pains, palpitations and leg swelling Denies abdominal pain, nausea, vomiting,diarrhea or constipation.   Denies dysuria, frequency, hesitancy or incontinence. Denies joint pain, swelling and limitation in mobility. Denies headaches, seizures, numbness, or tingling. Denies depression, anxiety or insomnia. Denies skin break down or rash.   PE  BP (!) 152/92   Pulse 74   Resp 16   Ht 5' 6 (1.676 m)   Wt 201 lb 1.3 oz (91.2 kg)   SpO2 96%   BMI 32.46 kg/m   Patient alert and oriented and in no cardiopulmonary distress.  HEENT: No facial asymmetry, EOMI,     Neck supple .  Chest: Clear to auscultation bilaterally.  CVS: S1, S2 no murmurs, no S3.Regular rate.  ABD: Soft non tender.   Ext: No edema  MS: Adequate ROM spine, shoulders, hips and knees.  Skin: Intact, no ulcerations or rash noted.  Psych: Good eye contact, normal affect. Memory intact not anxious or depressed appearing.  CNS: CN 2-12 intact, power,  normal throughout.no focal deficits noted.   Assessment & Plan  Essential hypertension Uncontrolled add coreg 3.125 mg twice daily DASH diet and  commitment to daily physical activity for a minimum of 30 minutes discussed and encouraged, as a part of hypertension management. The importance of attaining a healthy weight is also discussed.     02/13/2024    2:20 PM 02/13/2024    2:19 PM 02/13/2024    1:46 PM 02/06/2024   10:14 AM 01/02/2024    1:23 PM 08/29/2023    2:06 PM 05/30/2023   10:12 AM  BP/Weight  Systolic BP 152 150 152 143 153 133 124  Diastolic BP 92 94 79 80 83 75 82  Wt. (Lbs)   201.08 204.15 204.08  203.12  BMI   32.46 kg/m2 32.95 kg/m2 32.94 kg/m2  32.78 kg/m2       Hyperlipidemia LDL goal <100 Hyperlipidemia:Low fat diet discussed and encouraged.   Lipid Panel  Lab Results  Component Value Date   CHOL 152 12/14/2023   HDL 39 (L) 12/14/2023   LDLCALC 82 12/14/2023   LDLDIRECT 69 02/13/2009   TRIG 181 (H) 12/14/2023   CHOLHDL 3.9 12/14/2023     Updated lab needed at/ before next visit.   Prediabetes Patient educated about the importance of limiting  Carbohydrate intake , the need to commit to daily physical activity for a minimum of 30 minutes , and to commit weight loss. The fact that changes in all these areas will reduce or eliminate all together the development of diabetes is stressed.      Latest Ref Rng & Units 01/30/2024  9:47 AM 12/14/2023    8:39 AM 08/01/2023   10:06 AM 05/31/2023    8:03 AM 01/24/2023   10:17 AM  Diabetic Labs  HbA1c 4.8 - 5.6 %  5.8   5.7    Chol 100 - 199 mg/dL  847   835    HDL >60 mg/dL  39   37    Calc LDL 0 - 99 mg/dL  82   90    Triglycerides 0 - 149 mg/dL  818   783    Creatinine 0.44 - 1.00 mg/dL 8.74  8.49  8.48  8.82  1.40       02/13/2024    2:20 PM 02/13/2024    2:19 PM 02/13/2024    1:46 PM 02/06/2024   10:14 AM 01/02/2024    1:23 PM 08/29/2023    2:06 PM 05/30/2023   10:12 AM  BP/Weight  Systolic BP 152 150 152 143 153 133 124  Diastolic BP 92 94 79 80 83 75 82  Wt. (Lbs)   201.08 204.15 204.08  203.12  BMI   32.46 kg/m2 32.95 kg/m2 32.94 kg/m2  32.78  kg/m2       No data to display          Updated lab needed at/ before next visit.   Morbid obesity (HCC)  Patient re-educated about  the importance of commitment to a  minimum of 150 minutes of exercise per week as able.  The importance of healthy food choices with portion control discussed, as well as eating regularly and within a 12 hour window most days. The need to choose clean , green food 50 to 75% of the time is discussed, as well as to make water  the primary drink and set a goal of 64 ounces water  daily.       02/13/2024    1:46 PM 02/06/2024   10:14 AM 01/02/2024    1:23 PM  Weight /BMI  Weight 201 lb 1.3 oz 204 lb 2.3 oz 204 lb 1.3 oz  Height 5' 6 (1.676 m)  5' 6 (1.676 m)  BMI 32.46 kg/m2 32.95 kg/m2 32.94 kg/m2    improved  MGUS (monoclonal gammopathy of unknown significance) Followed by heme/onc  Seasonal allergies Improvd and controlled on current regime  Vitamin D  deficiency Updated lab needed at/ before next visit.   Urinary incontinence Controlled, no change in medication Managed by Urology

## 2024-02-13 NOTE — Assessment & Plan Note (Signed)
 Hyperlipidemia:Low fat diet discussed and encouraged.   Lipid Panel  Lab Results  Component Value Date   CHOL 152 12/14/2023   HDL 39 (L) 12/14/2023   LDLCALC 82 12/14/2023   LDLDIRECT 69 02/13/2009   TRIG 181 (H) 12/14/2023   CHOLHDL 3.9 12/14/2023     Updated lab needed at/ before next visit.

## 2024-02-13 NOTE — Assessment & Plan Note (Signed)
Controlled, no change in medication Managed by Urology

## 2024-02-13 NOTE — Assessment & Plan Note (Signed)
 Followed by heme onc.

## 2024-02-13 NOTE — Assessment & Plan Note (Signed)
 Uncontrolled add coreg 3.125 mg twice daily DASH diet and commitment to daily physical activity for a minimum of 30 minutes discussed and encouraged, as a part of hypertension management. The importance of attaining a healthy weight is also discussed.     02/13/2024    2:20 PM 02/13/2024    2:19 PM 02/13/2024    1:46 PM 02/06/2024   10:14 AM 01/02/2024    1:23 PM 08/29/2023    2:06 PM 05/30/2023   10:12 AM  BP/Weight  Systolic BP 152 150 152 143 153 133 124  Diastolic BP 92 94 79 80 83 75 82  Wt. (Lbs)   201.08 204.15 204.08  203.12  BMI   32.46 kg/m2 32.95 kg/m2 32.94 kg/m2  32.78 kg/m2

## 2024-02-13 NOTE — Assessment & Plan Note (Signed)
  Patient re-educated about  the importance of commitment to a  minimum of 150 minutes of exercise per week as able.  The importance of healthy food choices with portion control discussed, as well as eating regularly and within a 12 hour window most days. The need to choose clean , green food 50 to 75% of the time is discussed, as well as to make water  the primary drink and set a goal of 64 ounces water  daily.       02/13/2024    1:46 PM 02/06/2024   10:14 AM 01/02/2024    1:23 PM  Weight /BMI  Weight 201 lb 1.3 oz 204 lb 2.3 oz 204 lb 1.3 oz  Height 5' 6 (1.676 m)  5' 6 (1.676 m)  BMI 32.46 kg/m2 32.95 kg/m2 32.94 kg/m2    improved

## 2024-02-13 NOTE — Assessment & Plan Note (Signed)
 Updated lab needed at/ before next visit.

## 2024-02-13 NOTE — Assessment & Plan Note (Signed)
 Improvd and controlled on current regime

## 2024-02-13 NOTE — Assessment & Plan Note (Signed)
 Patient educated about the importance of limiting  Carbohydrate intake , the need to commit to daily physical activity for a minimum of 30 minutes , and to commit weight loss. The fact that changes in all these areas will reduce or eliminate all together the development of diabetes is stressed.      Latest Ref Rng & Units 01/30/2024    9:47 AM 12/14/2023    8:39 AM 08/01/2023   10:06 AM 05/31/2023    8:03 AM 01/24/2023   10:17 AM  Diabetic Labs  HbA1c 4.8 - 5.6 %  5.8   5.7    Chol 100 - 199 mg/dL  847   835    HDL >60 mg/dL  39   37    Calc LDL 0 - 99 mg/dL  82   90    Triglycerides 0 - 149 mg/dL  818   783    Creatinine 0.44 - 1.00 mg/dL 8.74  8.49  8.48  8.82  1.40       02/13/2024    2:20 PM 02/13/2024    2:19 PM 02/13/2024    1:46 PM 02/06/2024   10:14 AM 01/02/2024    1:23 PM 08/29/2023    2:06 PM 05/30/2023   10:12 AM  BP/Weight  Systolic BP 152 150 152 143 153 133 124  Diastolic BP 92 94 79 80 83 75 82  Wt. (Lbs)   201.08 204.15 204.08  203.12  BMI   32.46 kg/m2 32.95 kg/m2 32.94 kg/m2  32.78 kg/m2       No data to display          Updated lab needed at/ before next visit.

## 2024-02-13 NOTE — Patient Instructions (Addendum)
 Annual exam 10/ 16 or after, call if you need me sooner  Please always bring all meds you take to your visit , as you do, that is greatlipid, cmp and EGFR  New additonal med for bP is coreg 3.125 mg one twice daily Stay on your other three BP meds that you are taking   Fasting lipid, cmp and egfr, hBA1C 3 to 5 days before next appt  Keep eating from the garden  It is important that you exercise regularly at least 30 minutes 5 times a week. If you develop chest pain, have severe difficulty breathing, or feel very tired, stop exercising immediately and seek medical attention   Thanks for choosing [facility], we consider it a privelige to serve you.

## 2024-02-16 ENCOUNTER — Other Ambulatory Visit: Payer: Self-pay | Admitting: Family Medicine

## 2024-02-21 ENCOUNTER — Other Ambulatory Visit: Payer: Self-pay | Admitting: Family Medicine

## 2024-02-28 DIAGNOSIS — D631 Anemia in chronic kidney disease: Secondary | ICD-10-CM | POA: Diagnosis not present

## 2024-02-28 DIAGNOSIS — R809 Proteinuria, unspecified: Secondary | ICD-10-CM | POA: Diagnosis not present

## 2024-02-28 DIAGNOSIS — N189 Chronic kidney disease, unspecified: Secondary | ICD-10-CM | POA: Diagnosis not present

## 2024-02-28 DIAGNOSIS — E211 Secondary hyperparathyroidism, not elsewhere classified: Secondary | ICD-10-CM | POA: Diagnosis not present

## 2024-03-04 DIAGNOSIS — I5032 Chronic diastolic (congestive) heart failure: Secondary | ICD-10-CM | POA: Diagnosis not present

## 2024-03-04 DIAGNOSIS — I129 Hypertensive chronic kidney disease with stage 1 through stage 4 chronic kidney disease, or unspecified chronic kidney disease: Secondary | ICD-10-CM | POA: Diagnosis not present

## 2024-03-04 DIAGNOSIS — D472 Monoclonal gammopathy: Secondary | ICD-10-CM | POA: Diagnosis not present

## 2024-03-04 DIAGNOSIS — N1831 Chronic kidney disease, stage 3a: Secondary | ICD-10-CM | POA: Diagnosis not present

## 2024-03-15 ENCOUNTER — Other Ambulatory Visit: Payer: Self-pay | Admitting: Family Medicine

## 2024-03-19 ENCOUNTER — Other Ambulatory Visit: Payer: Self-pay | Admitting: Family Medicine

## 2024-04-09 ENCOUNTER — Other Ambulatory Visit: Payer: Self-pay | Admitting: Family Medicine

## 2024-04-16 ENCOUNTER — Ambulatory Visit (INDEPENDENT_AMBULATORY_CARE_PROVIDER_SITE_OTHER)

## 2024-04-16 DIAGNOSIS — Z23 Encounter for immunization: Secondary | ICD-10-CM

## 2024-04-16 NOTE — Progress Notes (Signed)
 Patient is in office today for a nurse visit for Immunization. Patient Injection was given in the  Right deltoid. Patient tolerated injection well.

## 2024-04-17 ENCOUNTER — Other Ambulatory Visit: Payer: Self-pay | Admitting: Family Medicine

## 2024-04-17 DIAGNOSIS — K219 Gastro-esophageal reflux disease without esophagitis: Secondary | ICD-10-CM

## 2024-04-18 ENCOUNTER — Other Ambulatory Visit: Payer: Self-pay | Admitting: Family Medicine

## 2024-04-18 DIAGNOSIS — R3915 Urgency of urination: Secondary | ICD-10-CM

## 2024-04-18 DIAGNOSIS — N952 Postmenopausal atrophic vaginitis: Secondary | ICD-10-CM

## 2024-04-18 NOTE — Telephone Encounter (Unsigned)
 Copied from CRM [id number]. Topic: Clinical - Medication Refill >> Apr 18, 2024  5:19 PM DeAngela L wrote: Medication: estradiol  (ESTRACE ) 0.1 MG/GM vaginal cream FEROSUL 325 (65 Fe) MG tablet potassium chloride  SA (KLOR-CON  M) 20 MEQ tablet montelukast  (SINGULAIR ) 10 MG tablet rosuvastatin  (CRESTOR ) 20 MG tablet solifenacin  (VESICARE ) 10 MG tablet  Has the patient contacted their pharmacy? Yes  (Agent: If no, request that the patient contact the pharmacy for the refill. If patient does not wish to contact the pharmacy document the reason why and proceed with request.) (Agent: If yes, when and what did the pharmacy advise?)  This is the patient's preferred pharmacy:  [facility], [place] - [address] [place] [address] Phone: [phone] Fax: [phone]  Is this the correct pharmacy for this prescription? Yes  If no, delete pharmacy and type the correct one.   Has the prescription been filled recently? Yes   Is the patient out of the medication? No   Has the patient been seen for an appointment in the last year OR does the patient have an upcoming appointment? Yes   Can we respond through MyChart? Unknown   Agent: Please be advised that Rx refills may take up to 3 business days. We ask that you follow-up with your pharmacy.

## 2024-04-19 MED ORDER — ESTRADIOL 0.1 MG/GM VA CREA: TOPICAL_CREAM | VAGINAL | 3 refills | Status: AC

## 2024-04-19 MED ORDER — SOLIFENACIN SUCCINATE 10 MG PO TABS: 10.0000 mg | ORAL_TABLET | Freq: Every day | ORAL | 3 refills | Status: AC

## 2024-04-19 MED ORDER — FERROUS SULFATE 325 (65 FE) MG PO TABS: 325.0000 mg | ORAL_TABLET | Freq: Every day | ORAL | 0 refills | Status: DC

## 2024-04-19 MED ORDER — POTASSIUM CHLORIDE CRYS ER 20 MEQ PO TBCR: EXTENDED_RELEASE_TABLET | ORAL | 2 refills | Status: DC

## 2024-04-19 MED ORDER — MONTELUKAST SODIUM 10 MG PO TABS: 10.0000 mg | ORAL_TABLET | Freq: Every day | ORAL | 11 refills | Status: DC

## 2024-04-19 MED ORDER — ROSUVASTATIN CALCIUM 20 MG PO TABS: 20.0000 mg | ORAL_TABLET | Freq: Every day | ORAL | 11 refills | Status: AC

## 2024-04-24 ENCOUNTER — Other Ambulatory Visit: Payer: Self-pay | Admitting: Family Medicine

## 2024-04-26 ENCOUNTER — Telehealth: Payer: Self-pay

## 2024-04-26 NOTE — Telephone Encounter (Signed)
 Copied from CRM [id number]. Topic: General - Other >> Apr 25, 2024  3:48 PM Fonda T wrote: Reason for CRM: Patient calling to inform primary preferred pharmacy is now [facility], [place] - [address] [place] [address] Phone: [phone] Fax: [phone].  Updated under preferred pharmacy as primary pharmacy.  Sending CRM to office to inform.

## 2024-05-11 ENCOUNTER — Other Ambulatory Visit: Payer: Self-pay | Admitting: Family Medicine

## 2024-05-11 DIAGNOSIS — K219 Gastro-esophageal reflux disease without esophagitis: Secondary | ICD-10-CM

## 2024-05-20 ENCOUNTER — Other Ambulatory Visit: Payer: Self-pay | Admitting: Family Medicine

## 2024-06-12 ENCOUNTER — Other Ambulatory Visit: Payer: Self-pay | Admitting: Family Medicine

## 2024-06-17 ENCOUNTER — Ambulatory Visit (HOSPITAL_COMMUNITY)
Admission: RE | Admit: 2024-06-17 | Discharge: 2024-06-17 | Disposition: A | Discharge status: Home / Self Care | Source: Ambulatory Visit | Attending: Family Medicine | Admitting: Family Medicine

## 2024-06-17 DIAGNOSIS — Z1231 Encounter for screening mammogram for malignant neoplasm of breast: Secondary | ICD-10-CM | POA: Insufficient documentation

## 2024-06-18 ENCOUNTER — Ambulatory Visit

## 2024-06-20 ENCOUNTER — Ambulatory Visit
Admission: RE | Admit: 2024-06-20 | Discharge: 2024-06-20 | Disposition: A | Discharge status: Home / Self Care | Attending: Family Medicine | Admitting: Family Medicine

## 2024-06-20 ENCOUNTER — Ambulatory Visit: Payer: Self-pay

## 2024-06-20 VITALS — BP 148/83 | HR 80 | Temp 98.6°F | Resp 20

## 2024-06-20 DIAGNOSIS — J22 Unspecified acute lower respiratory infection: Secondary | ICD-10-CM

## 2024-06-20 MED ORDER — AZELASTINE HCL 0.1 % NA SOLN: 1.0000 | Freq: Two times a day (BID) | NASAL | 0 refills | Status: AC

## 2024-06-20 MED ORDER — AZITHROMYCIN 250 MG PO TABS
ORAL_TABLET | ORAL | 0 refills | Status: AC
Start: 2024-06-20 — End: ?

## 2024-06-20 MED ORDER — PROMETHAZINE-DM 6.25-15 MG/5ML PO SYRP: 5.0000 mL | ORAL_SOLUTION | Freq: Four times a day (QID) | ORAL | 0 refills | Status: DC | PRN

## 2024-06-20 NOTE — ED Triage Notes (Signed)
 Pt reports cough and congestion, tightness in the chest, wheezing, pain in the back when breathing. Has been coughing up yellow phlegm since last week.

## 2024-06-20 NOTE — ED Provider Notes (Signed)
 [facility]    CSN: [id number] Arrival date & time: 06/20/24  1329      History   Chief Complaint Chief Complaint  Patient presents with   URI    CHEST TIGHTNESS, BACK PAIN, PRODUCTIVE COUGH - Entered by patient    HPI Sarah Coleman is a 73 y.o. female.   Presenting today with over a week of progressively worsening productive cough, congestion, now with chest tightness, soreness around the ribs, wheezing.  Denies fever, chest pain, abdominal pain, vomiting, diarrhea.  So far trying over-the-counter cold and congestion medication with minimal relief.  No known history of chronic pulmonary disease.  Does note that she has had bronchitis multiple times.    Past Medical History:  Diagnosis Date   Allergic rhinitis    Allergy    Anemia    Arthritis    GERD (gastroesophageal reflux disease) 2013   Hyperlipidemia    Hypertension    Metabolic syndrome X 2000   Morbid obesity (HCC) 2000   Obesity    Prediabetes 2010   Renal insufficiency    Seizures (HCC)    as child; unknown etiology.   Vertigo     Patient Active Problem List   Diagnosis Date Noted   Immunization due 05/30/2023   Acute pain of left knee 01/31/2023   Vitamin D  deficiency 08/21/2022   Neck pain on right side 06/03/2022   Encounter for annual physical exam 05/25/2022   Close exposure to COVID-19 virus 05/20/2022   Abnormal SPEP 04/11/2022   MGUS (monoclonal gammopathy of unknown significance) 03/12/2022   CKD (chronic kidney disease) stage 3, GFR 30-59 ml/min (HCC) 12/02/2021   Urinary incontinence 06/29/2021   Muscle strain 11/18/2020   Vitiligo 03/30/2020   Labial cyst 03/10/2020   Hemorrhoids 05/31/2018   History of adenomatous polyp of colon 07/19/2017   Allergic rhinitis 07/09/2017   Back pain with radiation 01/17/2017   Polyp of colon    Family hx of colon cancer    Metabolic syndrome X 12/04/2013   Prediabetes 04/05/2013   GERD (gastroesophageal reflux disease) 11/01/2012    Low back pain 06/21/2012   Hyperlipidemia LDL goal <100 06/09/2008   Morbid obesity (HCC) 12/06/2007   Essential hypertension 12/06/2007   Seasonal allergies 12/06/2007    Past Surgical History:  Procedure Laterality Date   BIOPSY  04/11/2017   Procedure: BIOPSY;  Surgeon: Harvey Margo CROME, MD;  Location: [facility];  Service: Endoscopy;;  gastric bx's   COLONOSCOPY N/A 10/07/2016   Procedure: COLONOSCOPY;  Surgeon: Margo CROME Harvey, MD;  Location: [facility];  Service: Endoscopy;  Laterality: N/A;  9:30 AM   COLONOSCOPY WITH PROPOFOL  N/A 11/01/2016   Procedure: COLONOSCOPY WITH PROPOFOL ;  Surgeon: Margo CROME Harvey, MD;  Location: [facility];  Service: Endoscopy;  Laterality: N/A;  830    COLONOSCOPY WITH PROPOFOL  N/A 01/23/2018   Procedure: COLONOSCOPY WITH PROPOFOL ;  Surgeon: Harvey Margo CROME, MD;  Location: [facility];  Service: Endoscopy;  Laterality: N/A;  2:00pm - pt knows to arrive at 10:15   COLONOSCOPY WITH PROPOFOL  N/A 09/27/2021   Procedure: COLONOSCOPY WITH PROPOFOL ;  Surgeon: Cindie Carlin POUR, DO;  Location: [facility];  Service: Endoscopy;  Laterality: N/A;  8:30 / ASA 3   ESOPHAGOGASTRODUODENOSCOPY (EGD) WITH PROPOFOL  N/A 04/11/2017   Procedure: ESOPHAGOGASTRODUODENOSCOPY (EGD) WITH PROPOFOL ;  Surgeon: Harvey Margo CROME, MD;  Location: [facility];  Service: Endoscopy;  Laterality: N/A;  10:45am   GERD  PARTIAL HYSTERECTOMY  1987   secondary to ovarian cyst    POLYPECTOMY  11/01/2016   Procedure: POLYPECTOMY;  Surgeon: Margo LITTIE Haddock, MD;  Location: [facility];  Service: Endoscopy;;  colon   POLYPECTOMY  01/23/2018   Procedure: POLYPECTOMY;  Surgeon: Haddock Margo LITTIE, MD;  Location: [facility];  Service: Endoscopy;;  ascending colon   SAVORY DILATION N/A 04/11/2017   Procedure: SAVORY DILATION;  Surgeon: Haddock Margo LITTIE, MD;  Location: [facility];  Service: Endoscopy;  Laterality: N/A;   TUBAL LIGATION  1975    OB History     Gravida  6   Para  5    Term  5   Preterm      AB  1   Living         SAB  1   IAB      Ectopic      Multiple      Live Births               Home Medications    Prior to Admission medications   Medication Sig Start Date End Date Taking? Authorizing Provider  azelastine  (ASTELIN ) 0.1 % nasal spray Place 1 spray into both nostrils 2 (two) times daily. Use in each nostril as directed 06/20/24  Yes Stuart Vernell Norris, PA-C  azithromycin  (ZITHROMAX ) 250 MG tablet Take first 2 tablets together, then 1 every day until finished. 06/20/24  Yes Stuart Vernell Norris, PA-C  promethazine -dextromethorphan (PROMETHAZINE -DM) 6.25-15 MG/5ML syrup Take 5 mLs by mouth 4 (four) times daily as needed. 06/20/24  Yes Stuart Vernell Norris, PA-C  acetaminophen  (TYLENOL ) 500 MG tablet Take 1,000 mg by mouth every 8 (eight) hours as needed for moderate pain or headache.    [provider]  amLODipine  (NORVASC ) 10 MG tablet TAKE 1 TABLET(S) BY MOUTH 1 TIMES PER DAY *NEW PRESCRIPTION REQUEST* 04/18/24   Antonetta Rollene BRAVO, MD  Azelastine  HCl 137 MCG/SPRAY SOLN USE 2 SPRAY(S) IN EACH NOSTRIL TWICE DAILY AS DIRECTED *NEW PRESCRIPTION REQUEST* 04/18/24   Antonetta Rollene BRAVO, MD  Calcium  Carbonate-Vit D-Min (CALCIUM  1200 PO) Take 1,200 mg by mouth daily.    [provider]  carvedilol  (COREG ) 3.125 MG tablet TAKE 1 TABLET(S) BY MOUTH 2 TIMES PER DAY *NEW PRESCRIPTION REQUEST* 04/18/24   Antonetta Rollene BRAVO, MD  cetirizine  (ZYRTEC ) 10 MG tablet Take 1 tablet (10 mg total) by mouth 2 (two) times daily. 01/02/24   Antonetta Rollene BRAVO, MD  chlorthalidone  (HYGROTON ) 25 MG tablet TAKE 1 TABLET(S) BY MOUTH 1 TIMES PER DAY *NEW PRESCRIPTION REQUEST* 04/18/24   Antonetta Rollene BRAVO, MD  estradiol  (ESTRACE ) 0.1 MG/GM vaginal cream USE 1 APPLICATORFUL VAGINALLY EVERY OTHER DAY AS NEEDED FOR  VAGINAL  DISCOMFORT 04/19/24   Antonetta Rollene BRAVO, MD  ezetimibe  (ZETIA ) 10 MG tablet TAKE 1 TABLET(S) BY MOUTH 1 TIMES PER DAY *NEW  PRESCRIPTION REQUEST* 04/18/24   Antonetta Rollene BRAVO, MD  fenofibrate  (TRICOR ) 145 MG tablet TAKE 1 TABLET BY MOUTH ONCE DAILY *NEW PRESCRIPTION REQUEST* 04/18/24   Antonetta Rollene BRAVO, MD  Ferrous Sulfate  (IRON) 325 (65 Fe) MG TABS Take 1 tablet by mouth once daily with breakfast 06/12/24   Antonetta Rollene BRAVO, MD  fluticasone  (FLONASE ) 50 MCG/ACT nasal spray USE 2 SPRAY(S) IN EACH NOSTRIL ONCE DAILY *NEW PRESCRIPTION REQUEST* 04/18/24   Antonetta Rollene BRAVO, MD  KLOR-CON  M20 20 MEQ tablet TAKE 2 TABLETS BY MOUTH THREE TIMES DAILY 05/20/24   Antonetta Rollene BRAVO, MD  montelukast  (SINGULAIR ) 10 MG tablet Take 1 tablet (10 mg total) by mouth at bedtime. 04/19/24   Antonetta Rollene BRAVO, MD  pantoprazole  (PROTONIX ) 20 MG tablet Take 1 tablet by mouth once daily 05/13/24   Antonetta Rollene BRAVO, MD  rosuvastatin  (CRESTOR ) 20 MG tablet Take 1 tablet (20 mg total) by mouth daily. 04/19/24   Antonetta Rollene BRAVO, MD  solifenacin  (VESICARE ) 10 MG tablet Take 1 tablet (10 mg total) by mouth daily. 04/19/24   Antonetta Rollene BRAVO, MD  spironolactone  (ALDACTONE ) 100 MG tablet Take 1 tablet by mouth once daily 05/20/24   Antonetta Rollene BRAVO, MD  VITAMIN D  PO Take 1,200 Units by mouth daily.    [provider]    Family History Family History  Problem Relation Age of Onset   Heart attack Mother    Heart disease Mother 27       massive heart attack   Prostate cancer Father    Colon cancer Father 92   Hypertension Sister    Ovarian cancer Sister    Cancer Brother 74       oral    Hypertension Brother    Heart disease Brother     Social History Social History   Tobacco Use   Smoking status: Former    Current packs/day: 0.00    Average packs/day: 1 pack/day for 3.0 years (3.0 ttl pk-yrs)    Types: Cigarettes    Start date: 08/16/1971    Quit date: 08/15/1974    Years since quitting: 49.8   Smokeless tobacco: Never  Vaping Use   Vaping status: Never Used  Substance Use Topics   Alcohol  use: No     Alcohol /week: 0.0 standard drinks of alcohol    Drug use: No     Allergies   Banana, Cinnamon, Ibuprofen, Orange fruit [citrus], Other, Penicillins, and Sweet potato   Review of Systems Review of Systems Per HPI  Physical Exam Triage Vital Signs ED Triage Vitals  Encounter Vitals Group     BP 06/20/24 1356 (!) 148/83     Girls Systolic BP Percentile --      Girls Diastolic BP Percentile --      Boys Systolic BP Percentile --      Boys Diastolic BP Percentile --      Pulse Rate 06/20/24 1356 80     Resp 06/20/24 1356 20     Temp 06/20/24 1356 98.6 F (37 C)     Temp Source 06/20/24 1356 Oral     SpO2 06/20/24 1356 94 %     Weight --      Height --      Head Circumference --      Peak Flow --      Pain Score 06/20/24 1358 0     Pain Loc --      Pain Education --      Exclude from Growth Chart --    No data found.  Updated Vital Signs BP (!) 148/83 (BP Location: Right Arm)   Pulse 80   Temp 98.6 F (37 C) (Oral)   Resp 20   SpO2 94%   Visual Acuity Right Eye Distance:   Left Eye Distance:   Bilateral Distance:    Right Eye Near:   Left Eye Near:    Bilateral Near:     Physical Exam Vitals and nursing note reviewed.  Constitutional:      Appearance: Normal appearance.  HENT:     Head: Atraumatic.  Right Ear: Tympanic membrane and external ear normal.     Left Ear: Tympanic membrane and external ear normal.     Nose: Congestion present.     Mouth/Throat:     Mouth: Mucous membranes are moist.     Pharynx: Posterior oropharyngeal erythema present.  Eyes:     Extraocular Movements: Extraocular movements intact.     Conjunctiva/sclera: Conjunctivae normal.  Cardiovascular:     Rate and Rhythm: Normal rate and regular rhythm.     Heart sounds: Normal heart sounds.  Pulmonary:     Effort: Pulmonary effort is normal.     Breath sounds: Wheezing and rales present.     Comments: Trace wheezes on exam Musculoskeletal:        General: Normal range of  motion.     Cervical back: Normal range of motion and neck supple.  Skin:    General: Skin is warm and dry.  Neurological:     Mental Status: She is alert and oriented to person, place, and time.  Psychiatric:        Mood and Affect: Mood normal.        Thought Content: Thought content normal.      UC Treatments / Results  Labs (all labs ordered are listed, but only abnormal results are displayed) Labs Reviewed - No data to display  EKG   Radiology No results found.  Procedures Procedures (including critical care time)  Medications Ordered in UC Medications - No data to display  Initial Impression / Assessment and Plan / UC Course  I have reviewed the triage vital signs and the nursing notes.  Pertinent labs & imaging results that were available during my care of the patient were reviewed by me and considered in my medical decision making (see chart for details).     Given duration and worsening course of symptoms, will treat with Zithromax , Phenergan  DM, Astelin , supportive over-the-counter medications and home care.  Return for worsening or unresolving symptoms.  Final Clinical Impressions(s) / UC Diagnoses   Final diagnoses:  Lower respiratory infection   Discharge Instructions   None    ED Prescriptions     Medication Sig Dispense Auth. Provider   azithromycin  (ZITHROMAX ) 250 MG tablet Take first 2 tablets together, then 1 every day until finished. 6 tablet Stuart Vernell Norris, PA-C   promethazine -dextromethorphan (PROMETHAZINE -DM) 6.25-15 MG/5ML syrup Take 5 mLs by mouth 4 (four) times daily as needed. 100 mL Stuart Vernell Norris, PA-C   azelastine  (ASTELIN ) 0.1 % nasal spray Place 1 spray into both nostrils 2 (two) times daily. Use in each nostril as directed 30 mL Stuart Vernell Norris, PA-C      PDMP not reviewed this encounter.   Stuart Vernell Norris, [place] 06/20/24 1431

## 2024-06-20 NOTE — Telephone Encounter (Signed)
 FYI Only or Action Required?: FYI only for provider: [facility].  Patient was last seen in primary care on 02/13/2024 by Antonetta Rollene BRAVO, MD.  Called Nurse Triage reporting Back Pain and Chest Pain.  Symptoms began a week ago.  Interventions attempted: OTC medications: robitussin, nasal wash.  Symptoms are: gradually worsening.  Triage Disposition: See PCP When Office is Open (Within 3 Days)  Patient/caregiver understands and will follow disposition?: Yes           Copied from CRM [id number]. Topic: Clinical - Red Word Triage >> Jun 20, 2024  9:20 AM Sarah Coleman wrote: Red Word that prompted transfer to Nurse Triage: Chest pain and back pain for 1 week due to a cold. Now it hurts worse Reason for Disposition  [1] Nasal discharge AND [2] present > 10 days    No access with PCP, scheduled with [facility].  Answer Assessment - Initial Assessment Questions 1. ONSET: When did the cough begin?      Last week 2. SEVERITY: How bad is the cough today?      worse this AM 3. SPUTUM: Describe the color of your sputum (e.g., none, dry cough; clear, white, yellow, green)     yellow 4. HEMOPTYSIS: Are you coughing up any blood? If Yes, ask: How much? (e.g., flecks, streaks, tablespoons, etc.)     denies 5. DIFFICULTY BREATHING: Are you having difficulty breathing? If Yes, ask: How bad is it? (e.g., mild, moderate, severe)      denies 6. FEVER: Do you have a fever? If Yes, ask: What is your temperature, how was it measured, and when did it start?     Denies, endorses low grade 7. CARDIAC HISTORY: Do you have any history of heart disease? (e.g., heart attack, congestive heart failure)      denies 8. LUNG HISTORY: Do you have any history of lung disease?  (e.g., pulmonary embolus, asthma, emphysema)     denies 9. PE RISK FACTORS: Do you have a history of blood clots? (or: recent major surgery, recent prolonged travel, bedridden)     denies 10. OTHER SYMPTOMS: Do you  have any other symptoms? (e.g., runny nose, wheezing, chest pain)       CP r/t chest cold - chest tightness, occasional sneezing 11. PREGNANCY: Is there any chance you are pregnant? When was your last menstrual period?       N/a 12. TRAVEL: Have you traveled out of the country in the last month? (e.g., travel history, exposures)       N/a  Protocols used: Cough - Acute Productive-A-AH

## 2024-06-20 NOTE — Telephone Encounter (Signed)
 Noted

## 2024-06-21 DIAGNOSIS — E785 Hyperlipidemia, unspecified: Secondary | ICD-10-CM | POA: Diagnosis not present

## 2024-06-21 DIAGNOSIS — R7303 Prediabetes: Secondary | ICD-10-CM | POA: Diagnosis not present

## 2024-06-22 ENCOUNTER — Ambulatory Visit: Payer: Self-pay | Admitting: Family Medicine

## 2024-06-22 LAB — CMP14+EGFR
ALT: 13 IU/L (ref 0–32)
AST: 13 IU/L (ref 0–40)
Albumin: 4.7 g/dL (ref 3.8–4.8)
Alkaline Phosphatase: 81 IU/L (ref 49–135)
BUN/Creatinine Ratio: 10 — ABNORMAL LOW (ref 12–28)
BUN: 11 mg/dL (ref 8–27)
Bilirubin Total: 0.5 mg/dL (ref 0.0–1.2)
CO2: 21 mmol/L (ref 20–29)
Calcium: 10 mg/dL (ref 8.7–10.3)
Chloride: 99 mmol/L (ref 96–106)
Creatinine, Ser: 1.08 mg/dL — ABNORMAL HIGH (ref 0.57–1.00)
Globulin, Total: 2.7 g/dL (ref 1.5–4.5)
Glucose: 103 mg/dL — ABNORMAL HIGH (ref 70–99)
Potassium: 4.6 mmol/L (ref 3.5–5.2)
Sodium: 137 mmol/L (ref 134–144)
Total Protein: 7.4 g/dL (ref 6.0–8.5)
eGFR: 54 mL/min/1.73 — ABNORMAL LOW (ref >59)

## 2024-06-22 LAB — LIPID PANEL
Chol/HDL Ratio: 2.8 ratio (ref 0.0–4.4)
Cholesterol, Total: 116 mg/dL (ref 100–199)
HDL: 41 mg/dL (ref >39)
LDL Chol Calc (NIH): 54 mg/dL (ref 0–99)
Triglycerides: 113 mg/dL (ref 0–149)
VLDL Cholesterol Cal: 21 mg/dL (ref 5–40)

## 2024-06-22 LAB — HEMOGLOBIN A1C
Est. average glucose Bld gHb Est-mCnc: 128 mg/dL
Hgb A1c MFr Bld: 6.1 % — ABNORMAL HIGH (ref 4.8–5.6)

## 2024-06-26 ENCOUNTER — Encounter: Payer: Self-pay | Admitting: Family Medicine

## 2024-06-26 ENCOUNTER — Ambulatory Visit: Admitting: Family Medicine

## 2024-06-26 VITALS — BP 138/72 | HR 70 | Resp 16 | Ht 66.0 in | Wt 196.0 lb

## 2024-06-26 DIAGNOSIS — E785 Hyperlipidemia, unspecified: Secondary | ICD-10-CM | POA: Diagnosis not present

## 2024-06-26 DIAGNOSIS — I10 Essential (primary) hypertension: Secondary | ICD-10-CM | POA: Diagnosis not present

## 2024-06-26 DIAGNOSIS — N1831 Chronic kidney disease, stage 3a: Secondary | ICD-10-CM

## 2024-06-26 DIAGNOSIS — E559 Vitamin D deficiency, unspecified: Secondary | ICD-10-CM | POA: Diagnosis not present

## 2024-06-26 DIAGNOSIS — Z0001 Encounter for general adult medical examination with abnormal findings: Secondary | ICD-10-CM | POA: Diagnosis not present

## 2024-06-26 DIAGNOSIS — R7303 Prediabetes: Secondary | ICD-10-CM

## 2024-06-26 MED ORDER — CHLORTHALIDONE 25 MG PO TABS: 25.0000 mg | ORAL_TABLET | Freq: Every day | ORAL | 3 refills | Status: AC

## 2024-06-26 MED ORDER — MONTELUKAST SODIUM 10 MG PO TABS: 10.0000 mg | ORAL_TABLET | Freq: Every day | ORAL | 3 refills | Status: AC

## 2024-06-26 MED ORDER — SPIRONOLACTONE 100 MG PO TABS: 100.0000 mg | ORAL_TABLET | Freq: Every day | ORAL | 3 refills | Status: AC

## 2024-06-26 MED ORDER — AMLODIPINE BESYLATE 10 MG PO TABS: 10.0000 mg | ORAL_TABLET | Freq: Every day | ORAL | 3 refills | Status: AC

## 2024-06-26 MED ORDER — POTASSIUM CHLORIDE CRYS ER 20 MEQ PO TBCR: 40.0000 meq | EXTENDED_RELEASE_TABLET | Freq: Three times a day (TID) | ORAL | 3 refills | Status: AC

## 2024-06-26 NOTE — Patient Instructions (Addendum)
 F/U in 6 months  Great labs  Keep up healthier food choice,, and exercise  Discuss with Hematology whether you still need iron and let them prescribe at December visit, I will also message Provider   Please get covid vaccine at your Pharmacy  Keep up the great work, you are doing well  It is important that you exercise regularly at least 30 minutes 5 times a week. If you develop chest pain, have severe difficulty breathing, or feel very tired, stop exercising immediately and seek medical attention    Thanks for choosing [facility], we consider it a privelige to serve you.   Fasting lipid, cmp and EGFr, hBa1C, tSH and vit D 1 week before appointment in 6 months

## 2024-06-26 NOTE — Progress Notes (Signed)
    Sarah Coleman     MRN: [id number]      DOB: [birth year]  Chief Complaint  Patient presents with   Annual Exam    Cpe     HPI: Patient is in for annual physical exam. No other health concerns are expressed or addressed at the visit. Recent labs,  are reviewed. Immunization is reviewed , and  updated if needed.   PE: BP 138/72   Pulse 70   Resp 16   Ht 5' 6 (1.676 m)   Wt 196 lb 0.6 oz (88.9 kg)   SpO2 97%   BMI 31.64 kg/m   Pleasant  female, alert and oriented x 3, in no cardio-pulmonary distress. Afebrile. HEENT No facial trauma or asymetry. Sinuses non tender.  Extra occullar muscles intact.. External ears normal, . Neck: supple, no adenopathy,JVD or thyromegaly.No bruits.  Chest: Clear to ascultation bilaterally.No crackles or wheezes. Non tender to palpation    Cardiovascular system; Heart sounds normal,  S1 and  S2 ,no S3.  No murmur, or thrill. Apical beat not displaced Peripheral pulses normal.  Abdomen: Soft, non tender, no organomegaly or masses. .   Musculoskeletal exam: Full ROM of spine, hips , shoulders and knees. No deformity ,swelling or crepitus noted. No muscle wasting or atrophy.   Neurologic: Cranial nerves 2 to 12 intact. Power, tone ,sensation and reflexes normal throughout. No disturbance in gait. No tremor.  Skin: Intact, no ulceration, erythema , scaling or rash noted. Pigmentation normal throughout  Psych; Normal mood and affect. Judgement and concentration normal   Assessment & Plan:  Encounter for Medicare annual examination with abnormal findings Annual exam as documented. Counseling done  re healthy lifestyle involving commitment to 150 minutes exercise per week, heart healthy diet, and attaining healthy weight.The importance of adequate sleep also discussed. Regular seat belt use and home safety, is also discussed. Changes in health habits are decided on by the patient with goals and time frames  set for  achieving them. Immunization and cancer screening needs are specifically addressed at this visit.

## 2024-06-27 DIAGNOSIS — Z0001 Encounter for general adult medical examination with abnormal findings: Secondary | ICD-10-CM | POA: Insufficient documentation

## 2024-06-27 NOTE — Assessment & Plan Note (Signed)

## 2024-07-02 ENCOUNTER — Other Ambulatory Visit: Payer: Self-pay | Admitting: Physician Assistant

## 2024-07-02 DIAGNOSIS — D75839 Thrombocytosis, unspecified: Secondary | ICD-10-CM

## 2024-07-02 NOTE — Progress Notes (Signed)
 Iron panel added per PCP request.

## 2024-07-25 ENCOUNTER — Ambulatory Visit
Admission: RE | Admit: 2024-07-25 | Discharge: 2024-07-25 | Disposition: A | Discharge status: Home / Self Care | Source: Ambulatory Visit | Attending: Nurse Practitioner

## 2024-07-25 ENCOUNTER — Ambulatory Visit (INDEPENDENT_AMBULATORY_CARE_PROVIDER_SITE_OTHER)

## 2024-07-25 VITALS — BP 151/76 | HR 78 | Temp 98.6°F | Resp 20

## 2024-07-25 DIAGNOSIS — R059 Cough, unspecified: Secondary | ICD-10-CM | POA: Diagnosis not present

## 2024-07-25 DIAGNOSIS — Z8709 Personal history of other diseases of the respiratory system: Secondary | ICD-10-CM

## 2024-07-25 MED ORDER — PROMETHAZINE-DM 6.25-15 MG/5ML PO SYRP: 5.0000 mL | ORAL_SOLUTION | Freq: Every evening | ORAL | 0 refills | Status: AC | PRN

## 2024-07-25 MED ORDER — PREDNISONE 20 MG PO TABS: 40.0000 mg | ORAL_TABLET | Freq: Every day | ORAL | 0 refills | Status: AC

## 2024-07-25 NOTE — ED Provider Notes (Signed)
 [facility]    CSN: [id number] Arrival date & time: 07/25/24  1346      History   Chief Complaint No chief complaint on file.   HPI Sarah Coleman is a 73 y.o. female.   The history is provided by the patient.   Patient presents for a 3-week history of cough, shortness of breath, and wheezing.  Patient states my bronchitis is flaring.  States this morning when she woke up, she coughed up dark brown phlegm.  States that she did not feel well after coughing up the sputum, but states that she feels better now than she did earlier today.  She was seen for the same or similar symptoms approximately 1 month ago and was prescribed an antibiotic.  She denies any obvious close sick contacts.  States she has been taking Robitussin for her symptoms.  Denies history of smoking or asthma.  Past Medical History:  Diagnosis Date   Allergic rhinitis    Allergy    Anemia    Arthritis    GERD (gastroesophageal reflux disease) 2013   Hyperlipidemia    Hypertension    Metabolic syndrome X 2000   Morbid obesity (HCC) 2000   Obesity    Prediabetes 2010   Renal insufficiency    Seizures (HCC)    as child; unknown etiology.   Vertigo     Patient Active Problem List   Diagnosis Date Noted   Encounter for Medicare annual examination with abnormal findings 06/27/2024   Vitamin D  deficiency 08/21/2022   Neck pain on right side 06/03/2022   Abnormal SPEP 04/11/2022   MGUS (monoclonal gammopathy of unknown significance) 03/12/2022   CKD (chronic kidney disease) stage 3, GFR 30-59 ml/min (HCC) 12/02/2021   Urinary incontinence 06/29/2021   Vitiligo 03/30/2020   Labial cyst 03/10/2020   Hemorrhoids 05/31/2018   History of adenomatous polyp of colon 07/19/2017   Allergic rhinitis 07/09/2017   Back pain with radiation 01/17/2017   Family hx of colon cancer    Metabolic syndrome X 12/04/2013   Prediabetes 04/05/2013   GERD (gastroesophageal reflux disease) 11/01/2012    Hyperlipidemia LDL goal <100 06/09/2008   Essential hypertension 12/06/2007   Seasonal allergies 12/06/2007    Past Surgical History:  Procedure Laterality Date   BIOPSY  04/11/2017   Procedure: BIOPSY;  Surgeon: Harvey Margo CROME, MD;  Location: [facility];  Service: Endoscopy;;  gastric bx's   COLONOSCOPY N/A 10/07/2016   Procedure: COLONOSCOPY;  Surgeon: Margo CROME Harvey, MD;  Location: [facility];  Service: Endoscopy;  Laterality: N/A;  9:30 AM   COLONOSCOPY WITH PROPOFOL  N/A 11/01/2016   Procedure: COLONOSCOPY WITH PROPOFOL ;  Surgeon: Margo CROME Harvey, MD;  Location: [facility];  Service: Endoscopy;  Laterality: N/A;  830    COLONOSCOPY WITH PROPOFOL  N/A 01/23/2018   Procedure: COLONOSCOPY WITH PROPOFOL ;  Surgeon: Harvey Margo CROME, MD;  Location: [facility];  Service: Endoscopy;  Laterality: N/A;  2:00pm - pt knows to arrive at 10:15   COLONOSCOPY WITH PROPOFOL  N/A 09/27/2021   Procedure: COLONOSCOPY WITH PROPOFOL ;  Surgeon: Cindie Carlin POUR, DO;  Location: [facility];  Service: Endoscopy;  Laterality: N/A;  8:30 / ASA 3   ESOPHAGOGASTRODUODENOSCOPY (EGD) WITH PROPOFOL  N/A 04/11/2017   Procedure: ESOPHAGOGASTRODUODENOSCOPY (EGD) WITH PROPOFOL ;  Surgeon: Harvey Margo CROME, MD;  Location: [facility];  Service: Endoscopy;  Laterality: N/A;  10:45am   GERD     PARTIAL HYSTERECTOMY  1987   secondary  to ovarian cyst    POLYPECTOMY  11/01/2016   Procedure: POLYPECTOMY;  Surgeon: Margo LITTIE Haddock, MD;  Location: [facility];  Service: Endoscopy;;  colon   POLYPECTOMY  01/23/2018   Procedure: POLYPECTOMY;  Surgeon: Haddock Margo LITTIE, MD;  Location: [facility];  Service: Endoscopy;;  ascending colon   SAVORY DILATION N/A 04/11/2017   Procedure: SAVORY DILATION;  Surgeon: Haddock Margo LITTIE, MD;  Location: [facility];  Service: Endoscopy;  Laterality: N/A;   TUBAL LIGATION  1975    OB History     Gravida  6   Para  5   Term  5   Preterm      AB  1   Living         SAB  1    IAB      Ectopic      Multiple      Live Births               Home Medications    Prior to Admission medications  Medication Sig Start Date End Date Taking? Authorizing Provider  acetaminophen  (TYLENOL ) 500 MG tablet Take 1,000 mg by mouth every 8 (eight) hours as needed for moderate pain or headache.    [provider]  amLODipine  (NORVASC ) 10 MG tablet Take 1 tablet (10 mg total) by mouth daily. 06/26/24   Antonetta Rollene BRAVO, MD  azelastine  (ASTELIN ) 0.1 % nasal spray Place 1 spray into both nostrils 2 (two) times daily. Use in each nostril as directed 06/20/24   Stuart Vernell Norris, PA-C  Azelastine  HCl 137 MCG/SPRAY SOLN USE 2 SPRAY(S) IN EACH NOSTRIL TWICE DAILY AS DIRECTED *NEW PRESCRIPTION REQUEST* 04/18/24   Antonetta Rollene BRAVO, MD  azithromycin  (ZITHROMAX ) 250 MG tablet Take first 2 tablets together, then 1 every day until finished. 06/20/24   Stuart Vernell Norris, PA-C  Calcium  Carbonate-Vit D-Min (CALCIUM  1200 PO) Take 1,200 mg by mouth daily.    [provider]  cetirizine  (ZYRTEC ) 10 MG tablet Take 1 tablet (10 mg total) by mouth 2 (two) times daily. 01/02/24   Antonetta Rollene BRAVO, MD  chlorthalidone  (HYGROTON ) 25 MG tablet Take 1 tablet (25 mg total) by mouth daily. 06/26/24   Antonetta Rollene BRAVO, MD  estradiol  (ESTRACE ) 0.1 MG/GM vaginal cream USE 1 APPLICATORFUL VAGINALLY EVERY OTHER DAY AS NEEDED FOR  VAGINAL  DISCOMFORT 04/19/24   Antonetta Rollene BRAVO, MD  ezetimibe  (ZETIA ) 10 MG tablet TAKE 1 TABLET(S) BY MOUTH 1 TIMES PER DAY *NEW PRESCRIPTION REQUEST* 04/18/24   Antonetta Rollene BRAVO, MD  fenofibrate  (TRICOR ) 145 MG tablet TAKE 1 TABLET BY MOUTH ONCE DAILY *NEW PRESCRIPTION REQUEST* 04/18/24   Antonetta Rollene BRAVO, MD  Ferrous Sulfate  (IRON) 325 (65 Fe) MG TABS Take 1 tablet by mouth once daily with breakfast 06/12/24   Antonetta Rollene BRAVO, MD  fluticasone  (FLONASE ) 50 MCG/ACT nasal spray USE 2 SPRAY(S) IN EACH NOSTRIL ONCE DAILY *NEW PRESCRIPTION  REQUEST* Patient not taking: Reported on 06/26/2024 04/18/24   Antonetta Rollene BRAVO, MD  montelukast  (SINGULAIR ) 10 MG tablet Take 1 tablet (10 mg total) by mouth at bedtime. 06/26/24   Antonetta Rollene BRAVO, MD  pantoprazole  (PROTONIX ) 20 MG tablet Take 1 tablet by mouth once daily 05/13/24   Antonetta Rollene BRAVO, MD  potassium chloride  SA (KLOR-CON  M20) 20 MEQ tablet Take 2 tablets (40 mEq total) by mouth 3 (three) times daily. 06/26/24   Antonetta Rollene BRAVO, MD  promethazine -dextromethorphan (PROMETHAZINE -DM) 6.25-15 MG/5ML syrup Take 5  mLs by mouth 4 (four) times daily as needed. 06/20/24   Stuart Vernell Norris, PA-C  rosuvastatin  (CRESTOR ) 20 MG tablet Take 1 tablet (20 mg total) by mouth daily. 04/19/24   Antonetta Rollene BRAVO, MD  solifenacin  (VESICARE ) 10 MG tablet Take 1 tablet (10 mg total) by mouth daily. 04/19/24   Antonetta Rollene BRAVO, MD  spironolactone  (ALDACTONE ) 100 MG tablet Take 1 tablet (100 mg total) by mouth daily. 06/26/24   Antonetta Rollene BRAVO, MD  VITAMIN D  PO Take 1,200 Units by mouth daily.    [provider]    Family History Family History  Problem Relation Age of Onset   Heart attack Mother    Heart disease Mother 29       massive heart attack   Prostate cancer Father    Colon cancer Father 37   Hypertension Sister    Ovarian cancer Sister    Cancer Brother 62       oral    Hypertension Brother    Heart disease Brother     Social History Social History[1]   Allergies   Banana, Cinnamon, Ibuprofen, Orange fruit [citrus], Other, Penicillins, and Sweet potato   Review of Systems Review of Systems Per HPI  Physical Exam Triage Vital Signs ED Triage Vitals  Encounter Vitals Group     BP 07/25/24 1419 (!) 151/76     Girls Systolic BP Percentile --      Girls Diastolic BP Percentile --      Boys Systolic BP Percentile --      Boys Diastolic BP Percentile --      Pulse Rate 07/25/24 1419 78     Resp 07/25/24 1419 20     Temp 07/25/24 1419 98.6 F  (37 C)     Temp Source 07/25/24 1419 Oral     SpO2 07/25/24 1419 95 %     Weight --      Height --      Head Circumference --      Peak Flow --      Pain Score 07/25/24 1422 3     Pain Loc --      Pain Education --      Exclude from Growth Chart --    No data found.  Updated Vital Signs BP (!) 151/76 (BP Location: Right Arm)   Pulse 78   Temp 98.6 F (37 C) (Oral)   Resp 20   SpO2 95%   Visual Acuity Right Eye Distance:   Left Eye Distance:   Bilateral Distance:    Right Eye Near:   Left Eye Near:    Bilateral Near:     Physical Exam Vitals and nursing note reviewed.  Constitutional:      General: She is not in acute distress.    Appearance: Normal appearance.  HENT:     Head: Normocephalic.     Right Ear: Tympanic membrane, ear canal and external ear normal.     Left Ear: Tympanic membrane, ear canal and external ear normal.     Nose: Congestion present.     Right Turbinates: Enlarged and swollen.     Left Turbinates: Enlarged and swollen.     Mouth/Throat:     Lips: Pink.     Mouth: Mucous membranes are moist.     Pharynx: Postnasal drip present. No pharyngeal swelling, oropharyngeal exudate, posterior oropharyngeal erythema or uvula swelling.     Comments: Cobblestoning present to posterior oropharynx  Eyes:  Extraocular Movements: Extraocular movements intact.     Conjunctiva/sclera: Conjunctivae normal.     Pupils: Pupils are equal, round, and reactive to light.  Cardiovascular:     Rate and Rhythm: Normal rate and regular rhythm.     Pulses: Normal pulses.     Heart sounds: Normal heart sounds.  Pulmonary:     Effort: Pulmonary effort is normal. No respiratory distress.     Breath sounds: Normal breath sounds. No stridor. No wheezing, rhonchi or rales.  Musculoskeletal:     Cervical back: Normal range of motion.  Skin:    General: Skin is warm and dry.  Neurological:     General: No focal deficit present.     Mental Status: She is alert and  oriented to person, place, and time.  Psychiatric:        Mood and Affect: Mood normal.        Behavior: Behavior normal.      UC Treatments / Results  Labs (all labs ordered are listed, but only abnormal results are displayed) Labs Reviewed - No data to display  EKG   Radiology No results found.  Procedures Procedures (including critical care time)  Medications Ordered in UC Medications - No data to display  Initial Impression / Assessment and Plan / UC Course  I have reviewed the triage vital signs and the nursing notes.  Pertinent labs & imaging results that were available during my care of the patient were reviewed by me and considered in my medical decision making (see chart for details).  On exam, the patient's lung sounds are clear throughout, room air sats are at 95%.  Patient reports cough for the past 3 weeks.  States that she coughed up dark brown phlegm this morning, but is feeling better than she did earlier today.  Patient was recently treated for the same or similar symptoms with an antibiotic approximately 1 month ago.  Chest x-ray is negative for active corneal pulmonary disease.  Will treat with prednisone  40 mg for the next 5 days for persistent cough, and Promethazine  DM for nighttime cough.  Patient advised to continue Robitussin for cough during the daytime.  Supportive care recommendations were provided discussed with the patient to include fluids, rest, over-the-counter analgesics, and use of a humidifier during sleep.  Discussed indications with the patient regarding follow-up.  Patient was in agreement with this plan of care and verbalizes understanding.  All questions were answered.  Patient stable for discharge.  Final Clinical Impressions(s) / UC Diagnoses   Final diagnoses:  Cough, unspecified type   Discharge Instructions   None    ED Prescriptions   None    PDMP not reviewed this encounter.     [1]  Social History Tobacco Use    Smoking status: Former    Current packs/day: 0.00    Average packs/day: 1 pack/day for 3.0 years (3.0 ttl pk-yrs)    Types: Cigarettes    Start date: 08/16/1971    Quit date: 08/15/1974    Years since quitting: 49.9   Smokeless tobacco: Never  Vaping Use   Vaping status: Never Used  Substance Use Topics   Alcohol  use: No    Alcohol /week: 0.0 standard drinks of alcohol    Drug use: No     Gilmer Etta PARAS, NP 07/25/24 2021

## 2024-07-25 NOTE — ED Triage Notes (Signed)
 Pt reports sinus congestion, cough, chest discomfort x 2 weeks started coughing up brown colored phlegm this morning and has started feeling worse since. Has been treating at home with Robitussin but has found no relief.

## 2024-07-25 NOTE — Discharge Instructions (Signed)
 The chest x-ray was negative. Take medication as prescribed.  You may continue Robitussin during the daytime for your cough as needed. You may take over-the-counter Tylenol  as needed for pain, fever, or general discomfort. Recommend increasing your fluids and allowing for plenty of rest. You may also use a humidifier in your bedroom at nighttime during sleep and sleeping elevated on pillows while symptoms persist. If your cough does not improve over the next 1 to 2 weeks, or appears to worsen, you may follow-up in this clinic or with your primary care physician for further evaluation. Follow-up as needed.

## 2024-07-30 ENCOUNTER — Inpatient Hospital Stay

## 2024-08-06 ENCOUNTER — Inpatient Hospital Stay: Admitting: Physician Assistant

## 2024-08-13 ENCOUNTER — Ambulatory Visit (INDEPENDENT_AMBULATORY_CARE_PROVIDER_SITE_OTHER)

## 2024-08-13 ENCOUNTER — Ambulatory Visit

## 2024-08-13 VITALS — Ht 66.0 in | Wt 196.0 lb

## 2024-08-13 DIAGNOSIS — Z0000 Encounter for general adult medical examination without abnormal findings: Secondary | ICD-10-CM | POA: Diagnosis not present

## 2024-08-13 NOTE — Progress Notes (Signed)
 "  Patient has no concerns Chief Complaint  Patient presents with   Medicare Wellness     Subjective:   Sarah Coleman is a 73 y.o. female who presents for a Medicare Annual Wellness Visit.  Visit info / Clinical Intake: Medicare Wellness Visit Type:: Subsequent Annual Wellness Visit Persons participating in visit and providing information:: patient Medicare Wellness Visit Mode:: Telephone If telephone:: video error Since this visit was completed virtually, some vitals may be partially provided or unavailable. Missing vitals are due to the limitations of the virtual format.: Documented vitals are patient reported If Telephone or Video please confirm:: I connected with patient using audio/video enable telemedicine. I verified patient identity with two identifiers, discussed telehealth limitations, and patient agreed to proceed. Patient Location:: home Provider Location:: office Interpreter Needed?: No Pre-visit prep was completed: yes AWV questionnaire completed by patient prior to visit?: no Living arrangements:: lives with spouse/significant other Patient's Overall Health Status Rating: very good Typical amount of pain: none Does pain affect daily life?: no Are you currently prescribed opioids?: no  Dietary Habits and Nutritional Risks How many meals a day?: 2 Eats fruit and vegetables daily?: yes Most meals are obtained by: preparing own meals In the last 2 weeks, have you had any of the following?: none Diabetic:: no  Functional Status Activities of Daily Living (to include ambulation/medication): Independent Ambulation: Independent Medication Administration: Independent Home Management (perform basic housework or laundry): Independent Manage your own finances?: yes Primary transportation is: driving Concerns about vision?: no *vision screening is required for WTM* Concerns about hearing?: no  Fall Screening Falls in the past year?: 0 Number of falls in past year:  0 Was there an injury with Fall?: 0 Fall Risk Category Calculator: 0 Patient Fall Risk Level: Low Fall Risk  Fall Risk Patient at Risk for Falls Due to: No Fall Risks Fall risk Follow up: Falls evaluation completed; Education provided; Falls prevention discussed  Home and Transportation Safety: All rugs have non-skid backing?: N/A, no rugs All stairs or steps have railings?: yes Grab bars in the bathtub or shower?: yes Have non-skid surface in bathtub or shower?: yes Good home lighting?: yes Regular seat belt use?: yes Hospital stays in the last year:: no  Cognitive Assessment Difficulty concentrating, remembering, or making decisions? : no Will 6CIT or Mini Cog be Completed: yes What year is it?: 0 points What month is it?: 0 points Give patient an address phrase to remember (5 components): [address] [facility] [place] About what time is it?: 0 points Count backwards from 20 to 1: 0 points Say the months of the year in reverse: 0 points Repeat the address phrase from earlier: 0 points 6 CIT Score: 0 points  Advance Directives (For Healthcare) Does Patient Have a Medical Advance Directive?: Yes Type of Advance Directive: Healthcare Power of [place]; Living will Copy of Healthcare Power of Attorney in Chart?: Yes - validated most recent copy scanned in chart (See row information) Copy of Living Will in Chart?: Yes - validated most recent copy scanned in chart (See row information) Would patient like information on creating a medical advance directive?: No - Patient declined  Reviewed/Updated  Reviewed/Updated: Reviewed All (Medical, Surgical, Family, Medications, Allergies, Care Teams, Patient Goals)    Allergies (verified) Banana, Cinnamon, Ibuprofen, Orange fruit [citrus], Other, Penicillins, and Sweet potato   Current Medications (verified) Outpatient Encounter Medications as of 08/13/2024  Medication Sig   acetaminophen  (TYLENOL ) 500 MG tablet Take 1,000 mg by mouth  every  8 (eight) hours as needed for moderate pain or headache.   amLODipine  (NORVASC ) 10 MG tablet Take 1 tablet (10 mg total) by mouth daily.   azelastine  (ASTELIN ) 0.1 % nasal spray Place 1 spray into both nostrils 2 (two) times daily. Use in each nostril as directed   Azelastine  HCl 137 MCG/SPRAY SOLN USE 2 SPRAY(S) IN EACH NOSTRIL TWICE DAILY AS DIRECTED *NEW PRESCRIPTION REQUEST*   azithromycin  (ZITHROMAX ) 250 MG tablet Take first 2 tablets together, then 1 every day until finished.   Calcium  Carbonate-Vit D-Min (CALCIUM  1200 PO) Take 1,200 mg by mouth daily.   cetirizine  (ZYRTEC ) 10 MG tablet Take 1 tablet (10 mg total) by mouth 2 (two) times daily.   chlorthalidone  (HYGROTON ) 25 MG tablet Take 1 tablet (25 mg total) by mouth daily.   estradiol  (ESTRACE ) 0.1 MG/GM vaginal cream USE 1 APPLICATORFUL VAGINALLY EVERY OTHER DAY AS NEEDED FOR  VAGINAL  DISCOMFORT   ezetimibe  (ZETIA ) 10 MG tablet TAKE 1 TABLET(S) BY MOUTH 1 TIMES PER DAY *NEW PRESCRIPTION REQUEST*   fenofibrate  (TRICOR ) 145 MG tablet TAKE 1 TABLET BY MOUTH ONCE DAILY *NEW PRESCRIPTION REQUEST*   Ferrous Sulfate  (IRON) 325 (65 Fe) MG TABS Take 1 tablet by mouth once daily with breakfast   fluticasone  (FLONASE ) 50 MCG/ACT nasal spray USE 2 SPRAY(S) IN EACH NOSTRIL ONCE DAILY *NEW PRESCRIPTION REQUEST*   montelukast  (SINGULAIR ) 10 MG tablet Take 1 tablet (10 mg total) by mouth at bedtime.   pantoprazole  (PROTONIX ) 20 MG tablet Take 1 tablet by mouth once daily   potassium chloride  SA (KLOR-CON  M20) 20 MEQ tablet Take 2 tablets (40 mEq total) by mouth 3 (three) times daily.   promethazine -dextromethorphan (PROMETHAZINE -DM) 6.25-15 MG/5ML syrup Take 5 mLs by mouth at bedtime as needed.   rosuvastatin  (CRESTOR ) 20 MG tablet Take 1 tablet (20 mg total) by mouth daily.   solifenacin  (VESICARE ) 10 MG tablet Take 1 tablet (10 mg total) by mouth daily.   spironolactone  (ALDACTONE ) 100 MG tablet Take 1 tablet (100 mg total) by mouth daily.    VITAMIN D  PO Take 1,200 Units by mouth daily.   No facility-administered encounter medications on file as of 08/13/2024.    History: Past Medical History:  Diagnosis Date   Allergic rhinitis    Allergy    Anemia    Arthritis    GERD (gastroesophageal reflux disease) 2013   Hyperlipidemia    Hypertension    Metabolic syndrome X 2000   Morbid obesity (HCC) 2000   Obesity    Prediabetes 2010   Renal insufficiency    Seizures (HCC)    as child; unknown etiology.   Vertigo    Past Surgical History:  Procedure Laterality Date   BIOPSY  04/11/2017   Procedure: BIOPSY;  Surgeon: Harvey Margo CROME, MD;  [facility];  Service: Endoscopy;;  gastric bx's   COLONOSCOPY N/A 10/07/2016   Procedure: COLONOSCOPY;  Surgeon: Margo CROME Harvey, MD;  [facility];  Service: Endoscopy;  Laterality: N/A;  9:30 AM   COLONOSCOPY WITH PROPOFOL  N/A 11/01/2016   Procedure: COLONOSCOPY WITH PROPOFOL ;  Surgeon: Margo CROME Harvey, MD;  [facility];  Service: Endoscopy;  Laterality: N/A;  830    COLONOSCOPY WITH PROPOFOL  N/A 01/23/2018   Procedure: COLONOSCOPY WITH PROPOFOL ;  Surgeon: Harvey Margo CROME, MD;  [facility];  Service: Endoscopy;  Laterality: N/A;  2:00pm - pt knows to arrive at 10:15   COLONOSCOPY WITH PROPOFOL  N/A 09/27/2021  Procedure: COLONOSCOPY WITH PROPOFOL ;  Surgeon: Cindie Carlin POUR, DO;  [facility];  Service: Endoscopy;  Laterality: N/A;  8:30 / ASA 3   ESOPHAGOGASTRODUODENOSCOPY (EGD) WITH PROPOFOL  N/A 04/11/2017   Procedure: ESOPHAGOGASTRODUODENOSCOPY (EGD) WITH PROPOFOL ;  Surgeon: Harvey Margo CROME, MD;  [facility];  Service: Endoscopy;  Laterality: N/A;  10:45am   GERD     PARTIAL HYSTERECTOMY  1987   secondary to ovarian cyst    POLYPECTOMY  11/01/2016   Procedure: POLYPECTOMY;  Surgeon: Margo CROME Harvey, MD;  [facility];  Service: Endoscopy;;  colon   POLYPECTOMY  01/23/2018   Procedure: POLYPECTOMY;  Surgeon:  Harvey Margo CROME, MD;  [facility];  Service: Endoscopy;;  ascending colon   SAVORY DILATION N/A 04/11/2017   Procedure: SAVORY DILATION;  Surgeon: Harvey Margo CROME, MD;  [facility];  Service: Endoscopy;  Laterality: N/A;   TUBAL LIGATION  1975   Family History  Problem Relation Age of Onset   Heart attack Mother    Heart disease Mother 37       massive heart attack   Prostate cancer Father    Colon cancer Father 29   Hypertension Sister    Ovarian cancer Sister    Cancer Brother 24       oral    Hypertension Brother    Heart disease Brother    Social History   Occupational History   Occupation: retired   Tobacco Use   Smoking status: Former    Current packs/day: 0.00    Average packs/day: 1 pack/day for 3.0 years (3.0 ttl pk-yrs)    Types: Cigarettes    Start date: 08/16/1971    Quit date: 08/15/1974    Years since quitting: 50.0   Smokeless tobacco: Never  Vaping Use   Vaping status: Never Used  Substance and Sexual Activity   Alcohol  use: No    Alcohol /week: 0.0 standard drinks of alcohol    Drug use: No   Sexual activity: Not Currently    Birth control/protection: Surgical    Comment: hyst   Tobacco Counseling Counseling given: Yes  SDOH Screenings   Food Insecurity: No Food Insecurity (08/13/2024)  Housing: Low Risk (08/13/2024)  Transportation Needs: No Transportation Needs (08/13/2024)  Utilities: Not At Risk (08/13/2024)  Alcohol  Screen: Low Risk (04/18/2023)  Depression (PHQ2-9): Low Risk (08/13/2024)  Financial Resource Strain: Low Risk (04/18/2023)  Physical Activity: Sufficiently Active (08/13/2024)  Social Connections: Socially Integrated (08/13/2024)  Stress: No Stress Concern Present (08/13/2024)  Tobacco Use: Medium Risk (08/13/2024)  Health Literacy: Adequate Health Literacy (08/13/2024)   See flowsheets for full screening details  Depression Screen PHQ 2 & 9 Depression Scale- Over the past 2 weeks, how often have you been  bothered by any of the following problems? Little interest or pleasure in doing things: 0 Feeling down, depressed, or hopeless (PHQ Adolescent also includes...irritable): 0 PHQ-2 Total Score: 0 Trouble falling or staying asleep, or sleeping too much: 0 Feeling tired or having little energy: 0 Poor appetite or overeating (PHQ Adolescent also includes...weight loss): 0 Feeling bad about yourself - or that you are a failure or have let yourself or your family down: 0 Trouble concentrating on things, such as reading the newspaper or watching television (PHQ Adolescent also includes...like school work): 0 Moving or speaking so slowly that other people could have noticed. Or the opposite - being so fidgety or restless that you have been moving around a lot more  than usual: 0 Thoughts that you would be better off dead, or of hurting yourself in some way: 0 PHQ-9 Total Score: 0  Depression Treatment Depression Interventions/Treatment : EYV7-0 Score <4 Follow-up Not Indicated     Goals Addressed             This Visit's Progress    Patient Stated   On track    Keep it moving. Remain active and independent.              Objective:    Today's Vitals   08/13/24 1045  Weight: 196 lb (88.9 kg)  Height: 5' 6 (1.676 m)   Body mass index is 31.64 kg/m.  Hearing/Vision screen Hearing Screening - Comments:: Patient denies any hearing difficulties.   Vision Screening - Comments:: Wears rx glasses - up to date with routine eye exams with  Thresa Louder, OD @ [facility] in [place] Immunizations and Health Maintenance Health Maintenance  Topic Date Due   COVID-19 Vaccine (5 - 2025-26 season) 04/15/2024   Medicare Annual Wellness (AWV)  04/17/2024   Bone Density Scan  06/13/2025   Mammogram  06/17/2025   Colonoscopy  09/27/2026   DTaP/Tdap/Td (3 - Td or Tdap) 04/08/2032   Pneumococcal Vaccine: 50+ Years  Completed   Influenza Vaccine  Completed   Hepatitis C Screening   Completed   Zoster Vaccines- Shingrix  Completed   Meningococcal B Vaccine  Aged Out        Assessment/Plan:  This is a routine wellness examination for [place].  Patient Care Team: Antonetta Rollene BRAVO, MD as PCP - General Matilda Senior, MD as Consulting Physician (Urology) Alvan, Dorn FALCON, MD as Consulting Physician (Cardiology) Lamon Pleasant CHRISTELLA DEVONNA as Physician Assistant (Oncology) Rachele Gaynell RAMAN, MD as Referring Physician (Nephrology) Geofm Delon BRAVO, NP as Nurse Practitioner (Nurse Practitioner) Louder Thresa, OD (Optometry)  I have personally reviewed and noted the following in the patients chart:   Medical and social history Use of alcohol , tobacco or illicit drugs  Current medications and supplements including opioid prescriptions. Functional ability and status Nutritional status Physical activity Advanced directives List of other physicians Hospitalizations, surgeries, and ER visits in previous 12 months Vitals Screenings to include cognitive, depression, and falls Referrals and appointments  No orders of the defined types were placed in this encounter.  In addition, I have reviewed and discussed with patient certain preventive protocols, quality metrics, and best practice recommendations. A written personalized care plan for preventive services as well as general preventive health recommendations were provided to patient.   Dellia Donnelly, CMA   08/13/2024   Return for Medicare Wellness Visit on Monday August 18, 2025 at 10:00 am in person.  After Visit Summary: (Declined) Due to this being a telephonic visit, with patients personalized plan was offered to patient but patient Declined AVS at this time    "

## 2024-08-13 NOTE — Patient Instructions (Signed)
 Sarah Coleman,  Thank you for taking the time for your Medicare Wellness Visit. I appreciate your continued commitment to your health goals. Please review the care plan we discussed, and feel free to reach out if I can assist you further.  Please note that Annual Wellness Visits do not include a physical exam. Some assessments may be limited, especially if the visit was conducted virtually. If needed, we may recommend an in-person follow-up with your provider.  Ongoing Care Seeing your primary care provider every 3 to 6 months helps us  monitor your health and provide consistent, personalized care.   1 year follow up for Medicare well visit: Monday August 18, 2025 at 10:00 am with medicare wellness nurse in office  Recommended Screenings:  Health Maintenance  Topic Date Due   COVID-19 Vaccine (5 - 2025-26 season) 04/15/2024   Medicare Annual Wellness Visit  04/17/2024   Osteoporosis screening with Bone Density Scan  06/13/2025   Breast Cancer Screening  06/17/2025   Colon Cancer Screening  09/27/2026   DTaP/Tdap/Td vaccine (3 - Td or Tdap) 04/08/2032   Pneumococcal Vaccine for age over 60  Completed   Flu Shot  Completed   Hepatitis C Screening  Completed   Zoster (Shingles) Vaccine  Completed   Meningitis B Vaccine  Aged Out       08/13/2024   10:59 AM  Advanced Directives  Does Patient Have a Medical Advance Directive? Yes  Type of Estate Agent of [place];Living will  Copy of Healthcare Power of Attorney in Chart? Yes - validated most recent copy scanned in chart (See row information)    Vision: Annual vision screenings are recommended for early detection of glaucoma, cataracts, and diabetic retinopathy. These exams can also reveal signs of chronic conditions such as diabetes and high blood pressure.  Dental: Annual dental screenings help detect early signs of oral cancer, gum disease, and other conditions linked to overall health, including heart disease and  diabetes.  Please see the attached documents for additional preventive care recommendations.

## 2024-09-05 ENCOUNTER — Ambulatory Visit: Payer: Self-pay

## 2024-09-05 ENCOUNTER — Encounter: Payer: Self-pay | Admitting: Family Medicine

## 2024-09-05 ENCOUNTER — Ambulatory Visit: Admitting: Family Medicine

## 2024-09-05 VITALS — BP 137/78 | HR 85 | Temp 98.7°F | Ht 66.0 in | Wt 201.4 lb

## 2024-09-05 DIAGNOSIS — M542 Cervicalgia: Secondary | ICD-10-CM | POA: Diagnosis not present

## 2024-09-05 MED ORDER — METHYLPREDNISOLONE ACETATE 80 MG/ML IJ SUSP
40.0000 mg | Freq: Once | INTRAMUSCULAR | Status: AC
  Administered 2024-09-05: 40 mg via INTRAMUSCULAR

## 2024-09-05 MED ORDER — PREDNISONE 20 MG PO TABS: 40.0000 mg | ORAL_TABLET | Freq: Every day | ORAL | 0 refills | Status: AC

## 2024-09-05 NOTE — Progress Notes (Signed)
 "    Subjective:  Patient ID: Sarah Coleman, female    DOB: [birth year], 74 y.o.   MRN: [id number]  Patient Care Team: Antonetta Rollene BRAVO, MD as PCP - General Matilda Senior, MD as Consulting Physician (Urology) Lamon Pleasant CHRISTELLA DEVONNA as Physician Assistant (Oncology) Rachele Gaynell RAMAN, MD as Referring Physician (Nephrology) Geofm Delon BRAVO, NP as Nurse Practitioner (Nurse Practitioner) Vicci Mcardle, OD (Optometry)   Chief Complaint:  ear/neck pain   HPI: OSA Sarah Coleman is a 74 y.o. female presenting on 09/05/2024 for ear/neck pain   Sarah Coleman is a 74 year old female with arthritis who presents with neck pain.  She has been experiencing neck pain for about a week, which has worsened today. The pain radiates to her ears and head, affecting both sides. No recent injuries to the neck have been reported.  She has not taken any medication specifically for the neck and ear pain but takes an arthritis pill every morning and two in the evening. She has not tried any heat therapy or changed her pillow to alleviate the pain.  She reports chills but no fever or exposure to anyone known to be sick. No pain radiating down the arms is noted.  Her past medical history includes arthritis, which typically flares up in the wintertime. She has experienced similar symptoms in the past, starting last year.          Relevant past medical, surgical, family, and social history reviewed and updated as indicated.  Allergies and medications reviewed and updated. Data reviewed: Chart in Epic.   Past Medical History:  Diagnosis Date   Allergic rhinitis    Allergy    Anemia    Arthritis    GERD (gastroesophageal reflux disease) 2013   Hyperlipidemia    Hypertension    Metabolic syndrome X 2000   Morbid obesity (HCC) 2000   Obesity    Prediabetes 2010   Renal insufficiency    Seizures (HCC)    as child; unknown etiology.   Vertigo     Past Surgical History:   Procedure Laterality Date   BIOPSY  04/11/2017   Procedure: BIOPSY;  Surgeon: Harvey Margo CROME, MD;  [facility]: [facility];  Service: Endoscopy;;  gastric bx's   COLONOSCOPY N/A 10/07/2016   Procedure: COLONOSCOPY;  Surgeon: Margo CROME Harvey, MD;  Location: [facility];  Service: Endoscopy;  Laterality: N/A;  9:30 AM   COLONOSCOPY WITH PROPOFOL  N/A 11/01/2016   Procedure: COLONOSCOPY WITH PROPOFOL ;  Surgeon: Margo CROME Harvey, MD;  Location: [facility];  Service: Endoscopy;  Laterality: N/A;  830    COLONOSCOPY WITH PROPOFOL  N/A 01/23/2018   Procedure: COLONOSCOPY WITH PROPOFOL ;  Surgeon: Harvey Margo CROME, MD;  Location: [facility];  Service: Endoscopy;  Laterality: N/A;  2:00pm - pt knows to arrive at 10:15   COLONOSCOPY WITH PROPOFOL  N/A 09/27/2021   Procedure: COLONOSCOPY WITH PROPOFOL ;  Surgeon: Cindie Carlin POUR, DO;  Location: [facility];  Service: Endoscopy;  Laterality: N/A;  8:30 / ASA 3   ESOPHAGOGASTRODUODENOSCOPY (EGD) WITH PROPOFOL  N/A 04/11/2017   Procedure: ESOPHAGOGASTRODUODENOSCOPY (EGD) WITH PROPOFOL ;  Surgeon: Harvey Margo CROME, MD;  Location: [facility];  Service: Endoscopy;  Laterality: N/A;  10:45am   GERD     PARTIAL HYSTERECTOMY  1987   secondary to ovarian cyst    POLYPECTOMY  11/01/2016   Procedure: POLYPECTOMY;  Surgeon: Margo CROME Harvey, MD;  Location: [facility];  Service: Endoscopy;;  colon   POLYPECTOMY  01/23/2018   Procedure: POLYPECTOMY;  Surgeon: Harvey Margo CROME, MD;  [facility]: [facility];  Service: Endoscopy;;  ascending colon   SAVORY DILATION N/A 04/11/2017   Procedure: SAVORY DILATION;  Surgeon: Harvey Margo CROME, MD;  Location: [facility];  Service: Endoscopy;  Laterality: N/A;   TUBAL LIGATION  1975    Social History   Socioeconomic History   Marital status: Married    Spouse name: Not on file   Number of children: 5   Years of education: Not on file   Highest education level: Not on file  Occupational History   Occupation: retired    Tobacco Use   Smoking status: Former    Current packs/day: 0.00    Average packs/day: 1 pack/day for 3.0 years (3.0 ttl pk-yrs)    Types: Cigarettes    Start date: 08/16/1971    Quit date: 08/15/1974    Years since quitting: 50.0   Smokeless tobacco: Never  Vaping Use   Vaping status: Never Used  Substance and Sexual Activity   Alcohol  use: No    Alcohol /week: 0.0 standard drinks of alcohol    Drug use: No   Sexual activity: Not Currently    Birth control/protection: Surgical    Comment: hyst  Other Topics Concern   Not on file  Social History Narrative   Not on file   Social Drivers of Health   Tobacco Use: Medium Risk (09/05/2024)   Patient History    Smoking Tobacco Use: Former    Smokeless Tobacco Use: Never    Passive Exposure: Not on file  Financial Resource Strain: Low Risk (04/18/2023)   Overall Financial Resource Strain (CARDIA)    Difficulty of Paying Living Expenses: Not hard at all  Food Insecurity: No Food Insecurity (08/13/2024)   Epic    Worried About Programme Researcher, Broadcasting/film/video in the Last Year: Never true    Ran Out of Food in the Last Year: Never true  Transportation Needs: No Transportation Needs (08/13/2024)   Epic    Lack of Transportation (Medical): No    Lack of Transportation (Non-Medical): No  Physical Activity: Sufficiently Active (08/13/2024)   Exercise Vital Sign    Days of Exercise per Week: 7 days    Minutes of Exercise per Session: 30 Coleman  Stress: No Stress Concern Present (08/13/2024)   [employer] of Occupational Health - Occupational Stress Questionnaire    Feeling of Stress: Not at all  Social Connections: Socially Integrated (08/13/2024)   Social Connection and Isolation Panel    Frequency of Communication with Friends and Family: More than three times a week    Frequency of Social Gatherings with Friends and Family: More than three times a week    Attends Religious Services: More than 4 times per year    Active Member of Clubs or  Organizations: Yes    Attends Banker Meetings: More than 4 times per year    Marital Status: Married  Catering Manager Violence: Not At Risk (08/13/2024)   Epic    Fear of Current or Ex-Partner: No    Emotionally Abused: No    Physically Abused: No    Sexually Abused: No  Depression (PHQ2-9): Low Risk (09/05/2024)   Depression (PHQ2-9)    PHQ-2 Score: 0  Alcohol  Screen: Low Risk (04/18/2023)   Alcohol  Screen    Last Alcohol  Screening Score (AUDIT): 0  Housing: Low Risk (08/13/2024)   Epic    Unable to  Pay for Housing in the Last Year: No    Number of Times Moved in the Last Year: 0    Homeless in the Last Year: No  Utilities: Not At Risk (08/13/2024)   Epic    Threatened with loss of utilities: No  Health Literacy: Adequate Health Literacy (08/13/2024)   B1300 Health Literacy    Frequency of need for help with medical instructions: Never    Outpatient Encounter Medications as of 09/05/2024  Medication Sig   predniSONE  (DELTASONE ) 20 MG tablet Take 2 tablets (40 mg total) by mouth daily with breakfast for 5 days.   acetaminophen  (TYLENOL ) 500 MG tablet Take 1,000 mg by mouth every 8 (eight) hours as needed for moderate pain or headache.   amLODipine  (NORVASC ) 10 MG tablet Take 1 tablet (10 mg total) by mouth daily.   azelastine  (ASTELIN ) 0.1 % nasal spray Place 1 spray into both nostrils 2 (two) times daily. Use in each nostril as directed   Azelastine  HCl 137 MCG/SPRAY SOLN USE 2 SPRAY(S) IN EACH NOSTRIL TWICE DAILY AS DIRECTED *NEW PRESCRIPTION REQUEST*   azithromycin  (ZITHROMAX ) 250 MG tablet Take first 2 tablets together, then 1 every day until finished.   Calcium  Carbonate-Vit D-Coleman (CALCIUM  1200 PO) Take 1,200 mg by mouth daily.   cetirizine  (ZYRTEC ) 10 MG tablet Take 1 tablet (10 mg total) by mouth 2 (two) times daily.   chlorthalidone  (HYGROTON ) 25 MG tablet Take 1 tablet (25 mg total) by mouth daily.   estradiol  (ESTRACE ) 0.1 MG/GM vaginal cream USE 1  APPLICATORFUL VAGINALLY EVERY OTHER DAY AS NEEDED FOR  VAGINAL  DISCOMFORT   ezetimibe  (ZETIA ) 10 MG tablet TAKE 1 TABLET(S) BY MOUTH 1 TIMES PER DAY *NEW PRESCRIPTION REQUEST*   fenofibrate  (TRICOR ) 145 MG tablet TAKE 1 TABLET BY MOUTH ONCE DAILY *NEW PRESCRIPTION REQUEST*   Ferrous Sulfate  (IRON) 325 (65 Fe) MG TABS Take 1 tablet by mouth once daily with breakfast   fluticasone  (FLONASE ) 50 MCG/ACT nasal spray USE 2 SPRAY(S) IN EACH NOSTRIL ONCE DAILY *NEW PRESCRIPTION REQUEST*   montelukast  (SINGULAIR ) 10 MG tablet Take 1 tablet (10 mg total) by mouth at bedtime.   pantoprazole  (PROTONIX ) 20 MG tablet Take 1 tablet by mouth once daily   potassium chloride  SA (KLOR-CON  M20) 20 MEQ tablet Take 2 tablets (40 mEq total) by mouth 3 (three) times daily.   promethazine -dextromethorphan (PROMETHAZINE -DM) 6.25-15 MG/5ML syrup Take 5 mLs by mouth at bedtime as needed.   rosuvastatin  (CRESTOR ) 20 MG tablet Take 1 tablet (20 mg total) by mouth daily.   solifenacin  (VESICARE ) 10 MG tablet Take 1 tablet (10 mg total) by mouth daily.   spironolactone  (ALDACTONE ) 100 MG tablet Take 1 tablet (100 mg total) by mouth daily.   VITAMIN D  PO Take 1,200 Units by mouth daily.   [EXPIRED] methylPREDNISolone  acetate (DEPO-MEDROL ) injection 40 mg    No facility-administered encounter medications on file as of 09/05/2024.    Allergies[1]  Pertinent ROS per HPI, otherwise unremarkable      Objective:  BP 137/78   Pulse 85   Temp 98.7 F (37.1 C)   Ht 5' 6 (1.676 m)   Wt 201 lb 6.4 oz (91.4 kg)   SpO2 96%   BMI 32.51 kg/m    Wt Readings from Last 3 Encounters:  09/05/24 201 lb 6.4 oz (91.4 kg)  08/13/24 196 lb (88.9 kg)  06/26/24 196 lb 0.6 oz (88.9 kg)    Physical Exam Vitals and nursing note reviewed.  Constitutional:  Appearance: Normal appearance. She is obese.  HENT:     Head: Normocephalic and atraumatic.     Right Ear: Hearing, tympanic membrane, ear canal and external ear normal.      Left Ear: Hearing, tympanic membrane, ear canal and external ear normal.     Nose: Nose normal. No congestion or rhinorrhea.     Mouth/Throat:     Mouth: Mucous membranes are moist.     Pharynx: Oropharynx is clear. No oropharyngeal exudate or posterior oropharyngeal erythema.  Eyes:     Conjunctiva/sclera: Conjunctivae normal.     Pupils: Pupils are equal, round, and reactive to light.  Neck:     Vascular: No carotid bruit.  Cardiovascular:     Rate and Rhythm: Normal rate and regular rhythm.     Heart sounds: Normal heart sounds.  Pulmonary:     Effort: Pulmonary effort is normal.     Breath sounds: Normal breath sounds.  Musculoskeletal:     Cervical back: Normal range of motion and neck supple. No edema, erythema, signs of trauma, rigidity, torticollis, tenderness or crepitus. Muscular tenderness (bilateral neck) present. No pain with movement or spinous process tenderness. Normal range of motion.  Lymphadenopathy:     Cervical: No cervical adenopathy.  Skin:    General: Skin is warm and dry.     Capillary Refill: Capillary refill takes less than 2 seconds.  Neurological:     General: No focal deficit present.     Mental Status: She is alert and oriented to person, place, and time.     Cranial Nerves: No cranial nerve deficit.     Motor: No weakness.     Gait: Gait normal.  Psychiatric:        Mood and Affect: Mood normal.        Behavior: Behavior normal.        Thought Content: Thought content normal.        Judgment: Judgment normal.      Results for orders placed or performed in visit on 02/13/24  Lipid panel   Collection Time: 06/21/24  8:36 AM  Result Value Ref Range   Cholesterol, Total 116 100 - 199 mg/dL   Triglycerides 886 0 - 149 mg/dL   HDL 41 >60 mg/dL   VLDL Cholesterol Cal 21 5 - 40 mg/dL   LDL Chol Calc (NIH) 54 0 - 99 mg/dL   Chol/HDL Ratio 2.8 0.0 - 4.4 ratio  CMP14+EGFR   Collection Time: 06/21/24  8:36 AM  Result Value Ref Range   Glucose 103  (H) 70 - 99 mg/dL   BUN 11 8 - 27 mg/dL   Creatinine, Ser 8.91 (H) 0.57 - 1.00 mg/dL   eGFR 54 (L) >40 fO/fpw/8.26   BUN/Creatinine Ratio 10 (L) 12 - 28   Sodium 137 134 - 144 mmol/L   Potassium 4.6 3.5 - 5.2 mmol/L   Chloride 99 96 - 106 mmol/L   CO2 21 20 - 29 mmol/L   Calcium  10.0 8.7 - 10.3 mg/dL   Total Protein 7.4 6.0 - 8.5 g/dL   Albumin 4.7 3.8 - 4.8 g/dL   Globulin, Total 2.7 1.5 - 4.5 g/dL   Bilirubin Total 0.5 0.0 - 1.2 mg/dL   Alkaline Phosphatase 81 49 - 135 IU/L   AST 13 0 - 40 IU/L   ALT 13 0 - 32 IU/L  Hemoglobin A1c   Collection Time: 06/21/24  8:36 AM  Result Value Ref Range   Hgb A1c MFr  Bld 6.1 (H) 4.8 - 5.6 %   Est. average glucose Bld gHb Est-mCnc 128 mg/dL       Pertinent labs & imaging results that were available during my care of the patient were reviewed by me and considered in my medical decision making.  Assessment & Plan:  Natally was seen today for ear/neck pain.  Diagnoses and all orders for this visit:  Neck pain, bilateral -     predniSONE  (DELTASONE ) 20 MG tablet; Take 2 tablets (40 mg total) by mouth daily with breakfast for 5 days. -     methylPREDNISolone  acetate (DEPO-MEDROL ) injection 40 mg     Cervical spondylosis with neck pain Chronic neck pain exacerbated over the past week, radiating to the ears and head. No recent neck injury. Pain worsens with certain head movements. Ears appear normal with no infection or fluid. Likely related to cervical spondylosis. NSAIDs contraindicated due to kidney function. Prediabetic status noted. Steroids chosen for anti-inflammatory effect. - Administered Depo-Med injection today. - Prescribed prednisone  for 5 days. - Advised follow-up with Doctor Antonetta if symptoms do not improve by Monday. - Will consider referral to orthopedic specialist if pain persists after steroid treatment.          Continue all other maintenance medications.  Follow up plan: Return if symptoms worsen or fail to  improve.   Continue healthy lifestyle choices, including diet (rich in fruits, vegetables, and lean proteins, and low in salt and simple carbohydrates) and exercise (at least 30 minutes of moderate physical activity daily).  Educational handout given for cervical radiculopathy   The above assessment and management plan was discussed with the patient. The patient verbalized understanding of and has agreed to the management plan. Patient is aware to call the clinic if they develop any new symptoms or if symptoms persist or worsen. Patient is aware when to return to the clinic for a follow-up visit. Patient educated on when it is appropriate to go to the emergency department.   Rosaline Bruns, FNP-C [facility] [phone]     [1]  Allergies Allergen Reactions   Banana Swelling   Cinnamon Swelling   Ibuprofen Swelling   Orange Fruit [Citrus] Swelling    Pt states she found out that she was allergic to oranges around may 2023 has swelling reations.   Other     Orange   Penicillins Nausea And Vomiting    Has patient had a PCN reaction causing immediate rash, facial/tongue/throat swelling, SOB or lightheadedness with hypotension:No Has patient had a PCN reaction causing severe rash involving mucus membranes or skin necrosis:No Has patient had a PCN reaction that required hospitalization:No Has patient had a PCN reaction occurring within the last 10 years:No If all of the above answers are NO, then may proceed with Cephalosporin use.    Sweet Potato Swelling   "

## 2024-09-05 NOTE — Telephone Encounter (Signed)
 FYI Only or Action Required?: FYI only for provider: appointment scheduled on 09/05/2024 at [facility].  Patient was last seen in primary care on 06/26/2024 by Antonetta Rollene BRAVO, MD.  Called Nurse Triage reporting Neck Pain.  Symptoms began several days ago.  Interventions attempted: Nothing.  Symptoms are: unchanged.  Triage Disposition: See PCP When Office is Open (Within 3 Days)  Patient/caregiver understands and will follow disposition?: Yes Message from Kendralyn S sent at 09/05/2024 11:17 AM EST  Reason for Triage: has pain running up both sides of neck started last week, comes and go but today both is bothering her consistently   Reason for Disposition  [1] MODERATE neck pain (e.g., interferes with normal activities) AND [2] present > 3 days  Answer Assessment - Initial Assessment Questions 1. ONSET: When did the pain begin?      Three days ago 2. LOCATION: Where does it hurt?      Neck pain on both sides and up into ear 3. PATTERN Does the pain come and go, or has it been constant since it started?      Comes and goes 4. SEVERITY: How bad is the pain?  (Scale 0-10; or none or slight stiffness, mild, moderate, severe) 8/10 5. RADIATION: Does the pain go anywhere else, shoot into your arms?     denies 6. CORD SYMPTOMS: Any weakness or numbness of the arms or legs?     denies 7. CAUSE: What do you think is causing the neck pain?     arthritis 8. NECK OVERUSE: Any recent activities that involved turning or twisting the neck?     denies 9. OTHER SYMPTOMS: Do you have any other symptoms? (e.g., headache, fever, chest pain, difficulty breathing, neck swelling)     denies  Protocols used: Neck Pain or Stiffness-A-AH

## 2024-09-05 NOTE — Telephone Encounter (Signed)
 Noted.

## 2024-10-21 ENCOUNTER — Inpatient Hospital Stay

## 2024-10-28 ENCOUNTER — Inpatient Hospital Stay: Admitting: Physician Assistant

## 2024-12-24 ENCOUNTER — Ambulatory Visit: Admitting: Family Medicine

## 2025-08-18 ENCOUNTER — Ambulatory Visit: Payer: Self-pay
